# Patient Record
Sex: Male | Born: 1970 | Race: Black or African American | Hispanic: No | Marital: Married | Smoking: Former smoker
Health system: Southern US, Community
[De-identification: ages and names within clinical notes are randomized; demographics above are authoritative.]

## PROBLEM LIST (undated history)

## (undated) DIAGNOSIS — K219 Gastro-esophageal reflux disease without esophagitis: Secondary | ICD-10-CM

## (undated) DIAGNOSIS — C801 Malignant (primary) neoplasm, unspecified: Secondary | ICD-10-CM

## (undated) DIAGNOSIS — G709 Myoneural disorder, unspecified: Secondary | ICD-10-CM

## (undated) DIAGNOSIS — D649 Anemia, unspecified: Secondary | ICD-10-CM

## (undated) HISTORY — PX: HERNIA REPAIR: SHX51

## (undated) MED FILL — Dexamethasone Sodium Phosphate Inj 100 MG/10ML: INTRAMUSCULAR | Qty: 1 | Status: AC

---

## 2021-01-06 DIAGNOSIS — C801 Malignant (primary) neoplasm, unspecified: Secondary | ICD-10-CM

## 2021-01-06 HISTORY — DX: Malignant (primary) neoplasm, unspecified: C80.1

## 2021-04-12 ENCOUNTER — Emergency Department (HOSPITAL_COMMUNITY): Payer: BC Managed Care – PPO

## 2021-04-12 ENCOUNTER — Observation Stay (HOSPITAL_COMMUNITY): Payer: BC Managed Care – PPO | Admitting: Certified Registered Nurse Anesthetist

## 2021-04-12 ENCOUNTER — Encounter (HOSPITAL_COMMUNITY): Payer: Self-pay

## 2021-04-12 ENCOUNTER — Inpatient Hospital Stay (HOSPITAL_COMMUNITY)
Admission: EM | Admit: 2021-04-12 | Discharge: 2021-04-16 | DRG: 377 | Disposition: A | Discharge status: Home / Self Care | Payer: BC Managed Care – PPO | Attending: Family Medicine | Admitting: Family Medicine

## 2021-04-12 ENCOUNTER — Encounter (HOSPITAL_COMMUNITY)
Admission: EM | Disposition: A | Discharge status: Home / Self Care | Payer: Self-pay | Source: Home / Self Care | Attending: Family Medicine

## 2021-04-12 ENCOUNTER — Other Ambulatory Visit: Payer: Self-pay

## 2021-04-12 DIAGNOSIS — E669 Obesity, unspecified: Secondary | ICD-10-CM | POA: Diagnosis present

## 2021-04-12 DIAGNOSIS — D62 Acute posthemorrhagic anemia: Secondary | ICD-10-CM | POA: Diagnosis present

## 2021-04-12 DIAGNOSIS — K760 Fatty (change of) liver, not elsewhere classified: Secondary | ICD-10-CM | POA: Diagnosis present

## 2021-04-12 DIAGNOSIS — Z833 Family history of diabetes mellitus: Secondary | ICD-10-CM | POA: Diagnosis not present

## 2021-04-12 DIAGNOSIS — K2970 Gastritis, unspecified, without bleeding: Secondary | ICD-10-CM

## 2021-04-12 DIAGNOSIS — K259 Gastric ulcer, unspecified as acute or chronic, without hemorrhage or perforation: Secondary | ICD-10-CM

## 2021-04-12 DIAGNOSIS — B9681 Helicobacter pylori [H. pylori] as the cause of diseases classified elsewhere: Secondary | ICD-10-CM | POA: Diagnosis present

## 2021-04-12 DIAGNOSIS — D75838 Other thrombocytosis: Secondary | ICD-10-CM | POA: Diagnosis present

## 2021-04-12 DIAGNOSIS — K5900 Constipation, unspecified: Secondary | ICD-10-CM | POA: Diagnosis present

## 2021-04-12 DIAGNOSIS — E278 Other specified disorders of adrenal gland: Secondary | ICD-10-CM | POA: Diagnosis present

## 2021-04-12 DIAGNOSIS — K5730 Diverticulosis of large intestine without perforation or abscess without bleeding: Secondary | ICD-10-CM | POA: Diagnosis present

## 2021-04-12 DIAGNOSIS — K2971 Gastritis, unspecified, with bleeding: Secondary | ICD-10-CM | POA: Diagnosis present

## 2021-04-12 DIAGNOSIS — N400 Enlarged prostate without lower urinary tract symptoms: Secondary | ICD-10-CM | POA: Diagnosis present

## 2021-04-12 DIAGNOSIS — Z6833 Body mass index (BMI) 33.0-33.9, adult: Secondary | ICD-10-CM | POA: Diagnosis not present

## 2021-04-12 DIAGNOSIS — E279 Disorder of adrenal gland, unspecified: Secondary | ICD-10-CM

## 2021-04-12 DIAGNOSIS — K631 Perforation of intestine (nontraumatic): Secondary | ICD-10-CM | POA: Diagnosis present

## 2021-04-12 DIAGNOSIS — Z87891 Personal history of nicotine dependence: Secondary | ICD-10-CM

## 2021-04-12 DIAGNOSIS — D649 Anemia, unspecified: Secondary | ICD-10-CM

## 2021-04-12 DIAGNOSIS — Y92009 Unspecified place in unspecified non-institutional (private) residence as the place of occurrence of the external cause: Secondary | ICD-10-CM

## 2021-04-12 DIAGNOSIS — T39395A Adverse effect of other nonsteroidal anti-inflammatory drugs [NSAID], initial encounter: Secondary | ICD-10-CM | POA: Diagnosis present

## 2021-04-12 DIAGNOSIS — K2100 Gastro-esophageal reflux disease with esophagitis, without bleeding: Secondary | ICD-10-CM | POA: Diagnosis present

## 2021-04-12 DIAGNOSIS — Z7982 Long term (current) use of aspirin: Secondary | ICD-10-CM

## 2021-04-12 DIAGNOSIS — K254 Chronic or unspecified gastric ulcer with hemorrhage: Principal | ICD-10-CM | POA: Diagnosis present

## 2021-04-12 DIAGNOSIS — K5790 Diverticulosis of intestine, part unspecified, without perforation or abscess without bleeding: Secondary | ICD-10-CM | POA: Diagnosis present

## 2021-04-12 DIAGNOSIS — D75839 Thrombocytosis, unspecified: Secondary | ICD-10-CM | POA: Diagnosis present

## 2021-04-12 DIAGNOSIS — E66811 Obesity, class 1: Secondary | ICD-10-CM | POA: Diagnosis present

## 2021-04-12 DIAGNOSIS — K922 Gastrointestinal hemorrhage, unspecified: Principal | ICD-10-CM | POA: Diagnosis present

## 2021-04-12 DIAGNOSIS — E871 Hypo-osmolality and hyponatremia: Secondary | ICD-10-CM | POA: Diagnosis present

## 2021-04-12 DIAGNOSIS — E611 Iron deficiency: Secondary | ICD-10-CM | POA: Diagnosis present

## 2021-04-12 DIAGNOSIS — K209 Esophagitis, unspecified without bleeding: Secondary | ICD-10-CM

## 2021-04-12 HISTORY — PX: ESOPHAGOGASTRODUODENOSCOPY: SHX5428

## 2021-04-12 HISTORY — DX: Gastrointestinal hemorrhage, unspecified: K92.2

## 2021-04-12 HISTORY — DX: Obesity, class 1: E66.811

## 2021-04-12 HISTORY — DX: Fatty (change of) liver, not elsewhere classified: K76.0

## 2021-04-12 HISTORY — PX: BIOPSY: SHX5522

## 2021-04-12 HISTORY — DX: Obesity, unspecified: E66.9

## 2021-04-12 HISTORY — DX: Diverticulosis of intestine, part unspecified, without perforation or abscess without bleeding: K57.90

## 2021-04-12 LAB — CBC
HCT: 24.3 % — ABNORMAL LOW (ref 39.0–52.0)
Hemoglobin: 7.7 g/dL — ABNORMAL LOW (ref 13.0–17.0)
MCH: 28 pg (ref 26.0–34.0)
MCHC: 31.7 g/dL (ref 30.0–36.0)
MCV: 88.4 fL (ref 80.0–100.0)
Platelets: 420 10*3/uL — ABNORMAL HIGH (ref 150–400)
RBC: 2.75 MIL/uL — ABNORMAL LOW (ref 4.22–5.81)
RDW: 15.7 % — ABNORMAL HIGH (ref 11.5–15.5)
WBC: 10.6 10*3/uL — ABNORMAL HIGH (ref 4.0–10.5)
nRBC: 0 % (ref 0.0–0.2)

## 2021-04-12 LAB — HEMOGLOBIN AND HEMATOCRIT, BLOOD
HCT: 23.4 % — ABNORMAL LOW (ref 39.0–52.0)
HCT: 24 % — ABNORMAL LOW (ref 39.0–52.0)
Hemoglobin: 7.9 g/dL — ABNORMAL LOW (ref 13.0–17.0)
Hemoglobin: 7.9 g/dL — ABNORMAL LOW (ref 13.0–17.0)

## 2021-04-12 LAB — URINALYSIS, ROUTINE W REFLEX MICROSCOPIC
Bilirubin Urine: NEGATIVE
Glucose, UA: NEGATIVE mg/dL
Hgb urine dipstick: NEGATIVE
Ketones, ur: 5 mg/dL — AB
Leukocytes,Ua: NEGATIVE
Nitrite: NEGATIVE
Protein, ur: NEGATIVE mg/dL
Specific Gravity, Urine: 1.03 (ref 1.005–1.030)
pH: 5 (ref 5.0–8.0)

## 2021-04-12 LAB — ABO/RH: ABO/RH(D): O POS

## 2021-04-12 LAB — COMPREHENSIVE METABOLIC PANEL
ALT: 11 U/L (ref 0–44)
AST: 15 U/L (ref 15–41)
Albumin: 4.1 g/dL (ref 3.5–5.0)
Alkaline Phosphatase: 69 U/L (ref 38–126)
Anion gap: 10 (ref 5–15)
BUN: 27 mg/dL — ABNORMAL HIGH (ref 6–20)
CO2: 26 mmol/L (ref 22–32)
Calcium: 9.1 mg/dL (ref 8.9–10.3)
Chloride: 97 mmol/L — ABNORMAL LOW (ref 98–111)
Creatinine, Ser: 1.01 mg/dL (ref 0.61–1.24)
GFR, Estimated: >60 mL/min (ref >60)
Glucose, Bld: 117 mg/dL — ABNORMAL HIGH (ref 70–99)
Potassium: 4.7 mmol/L (ref 3.5–5.1)
Sodium: 133 mmol/L — ABNORMAL LOW (ref 135–145)
Total Bilirubin: 0.6 mg/dL (ref 0.3–1.2)
Total Protein: 7.7 g/dL (ref 6.5–8.1)

## 2021-04-12 LAB — MRSA NEXT GEN BY PCR, NASAL: MRSA by PCR Next Gen: NOT DETECTED

## 2021-04-12 LAB — LIPASE, BLOOD: Lipase: 28 U/L (ref 11–51)

## 2021-04-12 LAB — POC OCCULT BLOOD, ED: Fecal Occult Bld: POSITIVE — AB

## 2021-04-12 LAB — PREPARE RBC (CROSSMATCH)

## 2021-04-12 SURGERY — EGD (ESOPHAGOGASTRODUODENOSCOPY)
Anesthesia: Monitor Anesthesia Care

## 2021-04-12 MED ORDER — ACETAMINOPHEN 325 MG PO TABS: 650.0000 mg | ORAL_TABLET | Freq: Four times a day (QID) | ORAL | Status: DC | PRN

## 2021-04-12 MED ORDER — SODIUM CHLORIDE 0.9 % IV BOLUS
1000.0000 mL | Freq: Once | INTRAVENOUS | Status: AC
Start: 2021-04-12 — End: 2021-04-12
  Administered 2021-04-12: 1000 mL via INTRAVENOUS

## 2021-04-12 MED ORDER — PANTOPRAZOLE SODIUM 40 MG IV SOLR: 40.0000 mg | Freq: Two times a day (BID) | INTRAVENOUS | Status: DC

## 2021-04-12 MED ORDER — SODIUM CHLORIDE 0.9 % IV SOLN
10.0000 mL/h | Freq: Once | INTRAVENOUS | Status: AC
Start: 2021-04-12 — End: 2021-04-12
  Administered 2021-04-12: 10 mL/h via INTRAVENOUS

## 2021-04-12 MED ORDER — LACTATED RINGERS IV SOLN: INTRAVENOUS | Status: DC

## 2021-04-12 MED ORDER — PROPOFOL 500 MG/50ML IV EMUL
INTRAVENOUS | Status: DC | PRN
  Administered 2021-04-12: 150 ug/kg/min via INTRAVENOUS

## 2021-04-12 MED ORDER — CHLORHEXIDINE GLUCONATE CLOTH 2 % EX PADS
6.0000 | MEDICATED_PAD | Freq: Every day | CUTANEOUS | Status: DC
  Administered 2021-04-12 – 2021-04-15 (×3): 6 via TOPICAL

## 2021-04-12 MED ORDER — PROPOFOL 500 MG/50ML IV EMUL
INTRAVENOUS | Status: DC | PRN
  Administered 2021-04-12: 20 mg via INTRAVENOUS

## 2021-04-12 MED ORDER — PANTOPRAZOLE SODIUM 40 MG IV SOLR
40.0000 mg | Freq: Once | INTRAVENOUS | Status: AC
Start: 2021-04-12 — End: 2021-04-12
  Administered 2021-04-12: 40 mg via INTRAVENOUS
  Filled 2021-04-12: qty 10

## 2021-04-12 MED ORDER — MORPHINE SULFATE (PF) 4 MG/ML IV SOLN
4.0000 mg | Freq: Once | INTRAVENOUS | Status: AC
  Administered 2021-04-12: 4 mg via INTRAVENOUS
  Filled 2021-04-12: qty 1

## 2021-04-12 MED ORDER — SODIUM CHLORIDE (PF) 0.9 % IJ SOLN
INTRAMUSCULAR | Status: AC
  Filled 2021-04-12: qty 50

## 2021-04-12 MED ORDER — IOHEXOL 300 MG/ML  SOLN
100.0000 mL | Freq: Once | INTRAMUSCULAR | Status: AC | PRN
  Administered 2021-04-12: 100 mL via INTRAVENOUS

## 2021-04-12 MED ORDER — ONDANSETRON HCL 4 MG/2ML IJ SOLN
4.0000 mg | Freq: Once | INTRAMUSCULAR | Status: AC
Start: 2021-04-12 — End: 2021-04-12
  Administered 2021-04-12: 4 mg via INTRAVENOUS
  Filled 2021-04-12: qty 2

## 2021-04-12 MED ORDER — ACETAMINOPHEN 650 MG RE SUPP
650.0000 mg | Freq: Four times a day (QID) | RECTAL | Status: DC | PRN
Start: 2021-04-12 — End: 2021-04-16

## 2021-04-12 MED ORDER — PANTOPRAZOLE INFUSION (NEW) - SIMPLE MED
8.0000 mg/h | INTRAVENOUS | Status: AC
Start: 2021-04-12 — End: 2021-04-15
  Administered 2021-04-12 – 2021-04-15 (×7): 8 mg/h via INTRAVENOUS
  Filled 2021-04-12: qty 80
  Filled 2021-04-12: qty 100
  Filled 2021-04-12 (×2): qty 80
  Filled 2021-04-12 (×2): qty 100
  Filled 2021-04-12 (×2): qty 80
  Filled 2021-04-12: qty 100
  Filled 2021-04-12: qty 80
  Filled 2021-04-12: qty 100
  Filled 2021-04-12: qty 80

## 2021-04-12 MED ORDER — SODIUM CHLORIDE 0.9 % IV SOLN: INTRAVENOUS | Status: DC

## 2021-04-12 NOTE — Interval H&P Note (Signed)
History and Physical Interval Note: ? ?04/12/2021 ?2:08 PM ? ?Jonathan Maynard  has presented today for surgery, with the diagnosis of GIB.  The various methods of treatment have been discussed with the patient and family. After consideration of risks, benefits and other options for treatment, the patient has consented to  Procedure(s): ?ESOPHAGOGASTRODUODENOSCOPY (EGD) (N/A) as a surgical intervention.  The patient's history has been reviewed, patient examined, no change in status, stable for surgery.  I have reviewed the patient's chart and labs.  Questions were answered to the patient's satisfaction.   ? ? ?[name] ? ? ?

## 2021-04-12 NOTE — Anesthesia Preprocedure Evaluation (Addendum)
Anesthesia Evaluation  ?Patient identified by MRN, date of birth, ID band ?Patient awake ? ? ? ?Reviewed: ?Allergy & Precautions, NPO status , Patient's Chart, lab work & pertinent test results ? ?Airway ?Mallampati: I ? ? ? ? ? ? Dental ?no notable dental hx. ? ?  ?Pulmonary ?neg pulmonary ROS, former smoker,  ?  ?Pulmonary exam normal ? ? ? ? ? ? ? Cardiovascular ?negative cardio ROS ?Normal cardiovascular exam ? ? ?  ?Neuro/Psych ?negative neurological ROS ? negative psych ROS  ? GI/Hepatic ?Neg liver ROS,   ?Endo/Other  ?negative endocrine ROS ? Renal/GU ?negative Renal ROS  ?negative genitourinary ?  ?Musculoskeletal ?negative musculoskeletal ROS ?(+)  ? Abdominal ?(+) + obese,   ?Peds ?negative pediatric ROS ?(+)  Hematology ? ?(+) Blood dyscrasia, anemia ,   ?Anesthesia Other Findings ? ? Reproductive/Obstetrics ?negative OB ROS ? ?  ? ? ? ? ? ? ? ? ? ? ? ? ? ?  ?  ? ? ? ? ? ? ? ? ?Anesthesia Physical ?Anesthesia Plan ? ?ASA: 2 ? ?Anesthesia Plan: MAC  ? ?Post-op Pain Management: Minimal or no pain anticipated  ? ?Induction: Intravenous ? ?PONV Risk Score and Plan: Propofol infusion, TIVA and Treatment may vary due to age or medical condition ? ?Airway Management Planned: Natural Airway and Mask ? ?Additional Equipment: None ? ?Intra-op Plan:  ? ?Post-operative Plan:  ? ?Informed Consent: I have reviewed the patients History and Physical, chart, labs and discussed the procedure including the risks, benefits and alternatives for the proposed anesthesia with the patient or authorized representative who has indicated his/her understanding and acceptance.  ? ? ? ?Dental advisory given ? ?Plan Discussed with: CRNA ? ?Anesthesia Plan Comments:   ? ? ? ? ? ?Anesthesia Quick Evaluation ? ?

## 2021-04-12 NOTE — ED Provider Notes (Signed)
?[facility] ?Provider Note ? ? ?CSN: [id number] ?Arrival date & time: 04/12/21  0418 ? ?  ? ?History ? ?Chief Complaint  ?Patient presents with  ? Emesis  ? ? ?Jonathan Maynard is a 51 y.o. male. ? ?The history is provided by the patient and medical records. No language interpreter was used.  ?Emesis ? ?51 year old male presenting with complaints of abdominal pain.  Patient report for the past 5 days he has had mid abdominal discomfort.  He described pain as sharp and cramping sensation waxing and waning with associated nausea, vomiting brownish emesis as well as having some constipation.  He endorses chills.  He denies fever chest pain shortness of breath productive cough dysuria.  He was seen at urgent care center several days prior for his symptoms, states he had an x-ray of his abdomen and was told that he was constipated.  He was prescribed MiraLAX, and stool softener.  Last bowel movement was yesterday and was soft.  He still endorse abdominal pain with associated nausea and vomiting.  No prior abdominal surgeries no dysuria.  No recent sick contact ? ?Home Medications ?Prior to Admission medications   ?Not on File  ?   ? ?Allergies    ?Patient has no known allergies.   ? ?Review of Systems   ?Review of Systems  ?Gastrointestinal:  Positive for vomiting.  ?All other systems reviewed and are negative. ? ?Physical Exam ?Updated Vital Signs ?BP (!) 128/95   Pulse (!) 106   Temp 98.4 ?F (36.9 ?C) (Oral)   Resp 18   Ht '5\' 5"'$  (1.651 m)   Wt 90.7 kg   SpO2 99%   BMI 33.28 kg/m?  ?Physical Exam ?Vitals and nursing note reviewed.  ?Constitutional:   ?   General: He is not in acute distress. ?   Appearance: He is well-developed.  ?HENT:  ?   Head: Atraumatic.  ?Eyes:  ?   Conjunctiva/sclera: Conjunctivae normal.  ?Cardiovascular:  ?   Rate and Rhythm: Normal rate and regular rhythm.  ?   Pulses: Normal pulses.  ?   Heart sounds: Normal heart sounds.  ?Pulmonary:  ?    Effort: Pulmonary effort is normal.  ?   Breath sounds: Normal breath sounds. No wheezing, rhonchi or rales.  ?Abdominal:  ?   General: Bowel sounds are normal.  ?   Palpations: Abdomen is soft.  ?   Tenderness: There is abdominal tenderness (Mild diffuse abdominal tenderness without focal point tenderness).  ?Genitourinary: ?   Comments: Chaperone present during exam.  Normal rectal tone, no obvious mass, melanotic stool noted on glove ?Musculoskeletal:  ?   Cervical back: Neck supple.  ?Skin: ?   Findings: No rash.  ?Neurological:  ?   Mental Status: He is alert. Mental status is at baseline.  ? ? ?ED Results / Procedures / Treatments   ?Labs ?(all labs ordered are listed, but only abnormal results are displayed) ?Labs Reviewed  ?COMPREHENSIVE METABOLIC PANEL - Abnormal; Notable for the following components:  ?    Result Value  ? Sodium 133 (*)   ? Chloride 97 (*)   ? Glucose, Bld 117 (*)   ? BUN 27 (*)   ? All other components within normal limits  ?CBC - Abnormal; Notable for the following components:  ? WBC 10.6 (*)   ? RBC 2.75 (*)   ? Hemoglobin 7.7 (*)   ? HCT 24.3 (*)   ? RDW 15.7 (*)   ?  Platelets 420 (*)   ? All other components within normal limits  ?URINALYSIS, ROUTINE W REFLEX MICROSCOPIC - Abnormal; Notable for the following components:  ? Ketones, ur 5 (*)   ? All other components within normal limits  ?HEMOGLOBIN AND HEMATOCRIT, BLOOD - Abnormal; Notable for the following components:  ? Hemoglobin 7.9 (*)   ? HCT 23.4 (*)   ? All other components within normal limits  ?HEMOGLOBIN AND HEMATOCRIT, BLOOD - Abnormal; Notable for the following components:  ? Hemoglobin 7.9 (*)   ? HCT 24.0 (*)   ? All other components within normal limits  ?POC OCCULT BLOOD, ED - Abnormal; Notable for the following components:  ? Fecal Occult Bld POSITIVE (*)   ? All other components within normal limits  ?MRSA NEXT GEN BY PCR, NASAL  ?LIPASE, BLOOD  ?PREPARE RBC (CROSSMATCH)  ?TYPE AND SCREEN  ?ABO/RH  ?SURGICAL  PATHOLOGY  ? ? ?EKG ?None ? ?Radiology ?CT ABDOMEN PELVIS W CONTRAST ? ?Result Date: 04/12/2021 ?CLINICAL DATA:  Acute abdominal pain. EXAM: CT ABDOMEN AND PELVIS WITH CONTRAST TECHNIQUE: Multidetector CT imaging of the abdomen and pelvis was performed using the standard protocol following bolus administration of intravenous contrast. RADIATION DOSE REDUCTION: This exam was performed according to the departmental dose-optimization program which includes automated exposure control, adjustment of the mA and/or kV according to patient size and/or use of iterative reconstruction technique. CONTRAST:  131m OMNIPAQUE IOHEXOL 300 MG/ML  SOLN COMPARISON:  None. FINDINGS: Lower chest: Lung bases are clear of infiltrates. There is a calcified granuloma laterally in the left lower lobe. The cardiac size is normal. Hepatobiliary: The liver 15.5 cm length mildly steatotic without mass enhancement. Unremarkable gallbladder, bile ducts. Pancreas: No focal abnormality. Spleen: No focal abnormality. Adrenals/Urinary Tract: 2 cm indeterminate left adrenal mass. Further evaluation recommended. There is no right adrenal mass. No significant abnormality of the renal cortex. There is a small wedge-shaped infarct defect in the superior pole right kidney. There is no urinary stone or obstruction. There is mild bladder thickening versus nondistention. There is a nodular protrusion of the prostate gland against the bladder base. Stomach/Bowel: The stomach is distended with fluid and there is moderate irregular circumferential thickening of the distal gastric antrum. The unopacified small bowel is unremarkable. The appendix is normal and well seen. There is mild stool retention. There are colonic diverticula without evidence of diverticulitis. Vascular/Lymphatic: Minimal aortic atherosclerosis. No AAA. There are enlarged mesenteric lymph nodes in the hypogastrium centered below the thickened gastric antrum and measuring up to 1.4 cm in short  axis. Mildly prominent portacaval and periportal nodes are noted up to 1 cm in short axis. There is no pelvic adenopathy. Reproductive: Enlarged prostate measures 4.8 cm, as above there is a nodular protrusion superiorly against the bladder base. Clinical correlation recommended. Other: Small umbilical and bilateral inguinal fat hernias. There are pelvic phleboliths. There is no free air, hemorrhage or fluid. Musculoskeletal: There are mild degenerative changes of the spine and hips. No acute or significant osseous findings. IMPRESSION: 1. There is circumferential irregular wall thickening of the gastric antrum which significantly narrows the distal gastric lumen and could be partially obstructing gastric emptying as the more proximal stomach is distended with fluid. Severe peptic ulcer disease and infiltrating disease are both possibilities and there is underlying adenopathy in the hypogastrium. Follow-up endoscopy is recommended. 2. Mild hepatic steatosis. 3. Prostatomegaly with mild bladder thickening or nondistention. 4. Diverticulosis without diverticulitis. 5. Umbilical and inguinal fat hernias. 6. Indeterminate 2  cm left adrenal nodule. Adrenal dedicated imaging recommended. Electronically Signed   By: Telford Nab M.D.   On: 04/12/2021 06:29   ? ?Procedures ?Marland KitchenCritical Care ?Performed by: Domenic Moras, PA-C ?Authorized by: Domenic Moras, PA-C  ? ?Critical care provider statement:  ?  Critical care time (minutes):  46 ?  Critical care was time spent personally by me on the following activities:  Development of treatment plan with patient or surrogate, discussions with consultants, evaluation of patient's response to treatment, examination of patient, ordering and review of laboratory studies, ordering and review of radiographic studies, ordering and performing treatments and interventions, pulse oximetry, re-evaluation of patient's condition and review of old charts  ? ? ?Medications Ordered in ED ?Medications   ?pantoprozole (PROTONIX) 80 mg /NS 100 mL infusion (8 mg/hr Intravenous New Bag/Given 04/12/21 1551)  ?pantoprazole (PROTONIX) injection 40 mg ( Intravenous MAR Unhold 04/12/21 1448)  ?acetaminophen (TYLENOL) tablet 650 mg

## 2021-04-12 NOTE — ED Notes (Signed)
Pt to Endo 

## 2021-04-12 NOTE — Brief Op Note (Signed)
04/12/2021 ? ?2:31 PM ? ?PATIENT:  Jonathan Maynard  51 y.o. male ? ?PRE-OPERATIVE DIAGNOSIS:  GIB ? ?POST-OPERATIVE DIAGNOSIS:  esophagitis, antrum/pyloric ulcers ? ?PROCEDURE:  Procedure(s): ?ESOPHAGOGASTRODUODENOSCOPY (EGD) (N/A) ?BIOPSY ? ?SURGEON:  Surgeon(s) and Role: ?   Wilford Corner, MD - Primary ? ?Large cratered (bleeding stigmata but not active bleeding) ulcers in antrum and pyloric channel of stomach likely due to NSAIDs and/or H. Pylori but malignancy also possible. Biopsies of antral wall taken. If significant bleeding recurs, then will need embolization by IR. Strict NPO today. Continue Protonix drip for next 2 days minimum. If stable ok to do ice chips and sips of water tomorrow. Will follow. Updated wife by phone. ?

## 2021-04-12 NOTE — ED Notes (Signed)
Notified  Iyari,nurse that the blood bank has 2 units of blood ready for this patient. ?

## 2021-04-12 NOTE — H&P (View-Only) (Signed)
Referring Provider: Dr. Velia Meyer ?Primary Care Physician:  Pcp, No ?Primary Gastroenterologist:  Unassigned ? ?Reason for Consultation:  GI Bleed ? ?HPI: Jonathan Maynard is a 51 y.o. male 2 weeks of intermittent sharp lower abdominal pain (described as epigastric in chart review of admission) and for coffee grounds emesis and black stools daily for the past week. Started taking multiple doses of Ibuprofen 2 weeks ago when his abdominal pain started and denies NSAIDs before then. Rare alcohol and none recently. Denies any known history of ulcers. Hgb 7.9. CT shows irregular antral wall thickening. He thinks he has had a colonoscopy in the past but does not recall for sure. ? ?Past Medical History:  ?Diagnosis Date  ? Acute upper GI bleed 04/12/2021  ? Class 1 obesity 04/12/2021  ? Diverticulosis 04/12/2021  ? Hepatic steatosis 04/12/2021  ? ? ?Past Surgical History:  ?Procedure Laterality Date  ? HERNIA REPAIR    ? ? ?Prior to Admission medications   ?Medication Sig Start Date End Date Taking? Authorizing Provider  ?ASPIRIN PO Take 1 tablet by mouth daily.   Yes [provider]  ?ibuprofen (ADVIL) 200 MG tablet Take 200 mg by mouth every 6 (six) hours as needed for mild pain or moderate pain.   Yes [provider]  ?polyethylene glycol (MIRALAX / GLYCOLAX) 17 g packet Take 17 g by mouth daily as needed for moderate constipation or mild constipation.   Yes [provider]  ? ? ?Scheduled Meds: ? [MAR Hold] pantoprazole  40 mg Intravenous Q12H  ? ?Continuous Infusions: ? sodium chloride 20 mL/hr at 04/12/21 1250  ? lactated ringers    ? pantoprazole 8 mg/hr (04/12/21 0711)  ? ?PRN Meds:.[MAR Hold] acetaminophen **OR** [MAR Hold] acetaminophen ? ?Allergies as of 04/12/2021  ? (No Known Allergies)  ? ? ?Family History  ?Problem Relation Age of Onset  ? Diabetes Mother   ? ? ?Social History  ? ?Socioeconomic History  ? Marital status: Married  ?  Spouse name: Not on file  ? Number of children:  Not on file  ? Years of education: Not on file  ? Highest education level: Not on file  ?Occupational History  ? Not on file  ?Tobacco Use  ? Smoking status: Former  ?  Packs/day: 0.50  ?  Years: 35.00  ?  Pack years: 17.50  ?  Types: Cigarettes  ? Smokeless tobacco: Never  ?Substance and Sexual Activity  ? Alcohol use: Not Currently  ? Drug use: Never  ? Sexual activity: Not on file  ?Other Topics Concern  ? Not on file  ?Social History Narrative  ? Not on file  ? ?Social Determinants of Health  ? ?Financial Resource Strain: Not on file  ?Food Insecurity: Not on file  ?Transportation Needs: Not on file  ?Physical Activity: Not on file  ?Stress: Not on file  ?Social Connections: Not on file  ?Intimate Partner Violence: Not on file  ? ? ?Review of Systems: All negative except as stated above in HPI. ? ?Physical Exam: ?Vital signs: ?Vitals:  ? 04/12/21 1243 04/12/21 1317  ?BP: 102/75 115/64  ?Pulse: 75 74  ?Resp: 17 20  ?Temp: 98.3 ?F (36.8 ?C) 97.7 ?F (36.5 ?C)  ?SpO2: 99% 97%  ? ?  ?General:   Lethargic, Well-developed, well-nourished, pleasant and cooperative in NAD ?Head: normocephalic, atraumatic ?Eyes: anicteric sclera ?ENT: oropharynx clear ?Neck: supple, nontender ?Lungs:  Clear throughout to auscultation.   No wheezes, crackles, or rhonchi. No acute distress. ?  Heart:  Regular rate and rhythm; no murmurs, clicks, rubs,  or gallops. ?Abdomen: soft, nontender, nondistended, +BS ?Rectal:  Deferred ?Ext: no edema ? ?GI:  ?Lab Results: ?Recent Labs  ?  04/12/21 ?7902 04/12/21 ?1300  ?WBC 10.6*  --   ?HGB 7.7* 7.9*  ?HCT 24.3* 23.4*  ?PLT 420*  --   ? ?BMET ?Recent Labs  ?  04/12/21 ?0435  ?NA 133*  ?K 4.7  ?CL 97*  ?CO2 26  ?GLUCOSE 117*  ?BUN 27*  ?CREATININE 1.01  ?CALCIUM 9.1  ? ?LFT ?Recent Labs  ?  04/12/21 ?0435  ?PROT 7.7  ?ALBUMIN 4.1  ?AST 15  ?ALT 11  ?ALKPHOS 69  ?BILITOT 0.6  ? ?PT/INR ?No results for input(s): LABPROT, INR in the last 72 hours. ? ? ?Studies/Results: ?CT ABDOMEN PELVIS W  CONTRAST ? ?Result Date: 04/12/2021 ?CLINICAL DATA:  Acute abdominal pain. EXAM: CT ABDOMEN AND PELVIS WITH CONTRAST TECHNIQUE: Multidetector CT imaging of the abdomen and pelvis was performed using the standard protocol following bolus administration of intravenous contrast. RADIATION DOSE REDUCTION: This exam was performed according to the departmental dose-optimization program which includes automated exposure control, adjustment of the mA and/or kV according to patient size and/or use of iterative reconstruction technique. CONTRAST:  19m OMNIPAQUE IOHEXOL 300 MG/ML  SOLN COMPARISON:  None. FINDINGS: Lower chest: Lung bases are clear of infiltrates. There is a calcified granuloma laterally in the left lower lobe. The cardiac size is normal. Hepatobiliary: The liver 15.5 cm length mildly steatotic without mass enhancement. Unremarkable gallbladder, bile ducts. Pancreas: No focal abnormality. Spleen: No focal abnormality. Adrenals/Urinary Tract: 2 cm indeterminate left adrenal mass. Further evaluation recommended. There is no right adrenal mass. No significant abnormality of the renal cortex. There is a small wedge-shaped infarct defect in the superior pole right kidney. There is no urinary stone or obstruction. There is mild bladder thickening versus nondistention. There is a nodular protrusion of the prostate gland against the bladder base. Stomach/Bowel: The stomach is distended with fluid and there is moderate irregular circumferential thickening of the distal gastric antrum. The unopacified small bowel is unremarkable. The appendix is normal and well seen. There is mild stool retention. There are colonic diverticula without evidence of diverticulitis. Vascular/Lymphatic: Minimal aortic atherosclerosis. No AAA. There are enlarged mesenteric lymph nodes in the hypogastrium centered below the thickened gastric antrum and measuring up to 1.4 cm in short axis. Mildly prominent portacaval and periportal nodes are  noted up to 1 cm in short axis. There is no pelvic adenopathy. Reproductive: Enlarged prostate measures 4.8 cm, as above there is a nodular protrusion superiorly against the bladder base. Clinical correlation recommended. Other: Small umbilical and bilateral inguinal fat hernias. There are pelvic phleboliths. There is no free air, hemorrhage or fluid. Musculoskeletal: There are mild degenerative changes of the spine and hips. No acute or significant osseous findings. IMPRESSION: 1. There is circumferential irregular wall thickening of the gastric antrum which significantly narrows the distal gastric lumen and could be partially obstructing gastric emptying as the more proximal stomach is distended with fluid. Severe peptic ulcer disease and infiltrating disease are both possibilities and there is underlying adenopathy in the hypogastrium. Follow-up endoscopy is recommended. 2. Mild hepatic steatosis. 3. Prostatomegaly with mild bladder thickening or nondistention. 4. Diverticulosis without diverticulitis. 5. Umbilical and inguinal fat hernias. 6. Indeterminate 2 cm left adrenal nodule. Adrenal dedicated imaging recommended. Electronically Signed   By: KTelford NabM.D.   On: 04/12/2021 06:29   ? ?  Impression/Plan: ?Upper GI bleed likely due to an ulcer with his recent heavy NSAID use. CT showing irregular appearance of that gastric antral wall. Gastric malignancy also possible. EGD today. PPI drip. Supportive care. ? ? ? LOS: 0 days  ? ?[name]  04/12/2021, 2:01 PM ? ?Questions please call [phone]  ? ?

## 2021-04-12 NOTE — Consult Note (Signed)
Referring Provider: Dr. Velia Meyer ?Primary Care Physician:  Pcp, No ?Primary Gastroenterologist:  Unassigned ? ?Reason for Consultation:  GI Bleed ? ?HPI: Jonathan Maynard is a 51 y.o. male 2 weeks of intermittent sharp lower abdominal pain (described as epigastric in chart review of admission) and for coffee grounds emesis and black stools daily for the past week. Started taking multiple doses of Ibuprofen 2 weeks ago when his abdominal pain started and denies NSAIDs before then. Rare alcohol and none recently. Denies any known history of ulcers. Hgb 7.9. CT shows irregular antral wall thickening. He thinks he has had a colonoscopy in the past but does not recall for sure. ? ?Past Medical History:  ?Diagnosis Date  ? Acute upper GI bleed 04/12/2021  ? Class 1 obesity 04/12/2021  ? Diverticulosis 04/12/2021  ? Hepatic steatosis 04/12/2021  ? ? ?Past Surgical History:  ?Procedure Laterality Date  ? HERNIA REPAIR    ? ? ?Prior to Admission medications   ?Medication Sig Start Date End Date Taking? Authorizing Provider  ?ASPIRIN PO Take 1 tablet by mouth daily.   Yes [provider]  ?ibuprofen (ADVIL) 200 MG tablet Take 200 mg by mouth every 6 (six) hours as needed for mild pain or moderate pain.   Yes [provider]  ?polyethylene glycol (MIRALAX / GLYCOLAX) 17 g packet Take 17 g by mouth daily as needed for moderate constipation or mild constipation.   Yes [provider]  ? ? ?Scheduled Meds: ? [MAR Hold] pantoprazole  40 mg Intravenous Q12H  ? ?Continuous Infusions: ? sodium chloride 20 mL/hr at 04/12/21 1250  ? lactated ringers    ? pantoprazole 8 mg/hr (04/12/21 0711)  ? ?PRN Meds:.[MAR Hold] acetaminophen **OR** [MAR Hold] acetaminophen ? ?Allergies as of 04/12/2021  ? (No Known Allergies)  ? ? ?Family History  ?Problem Relation Age of Onset  ? Diabetes Mother   ? ? ?Social History  ? ?Socioeconomic History  ? Marital status: Married  ?  Spouse name: Not on file  ? Number of children:  Not on file  ? Years of education: Not on file  ? Highest education level: Not on file  ?Occupational History  ? Not on file  ?Tobacco Use  ? Smoking status: Former  ?  Packs/day: 0.50  ?  Years: 35.00  ?  Pack years: 17.50  ?  Types: Cigarettes  ? Smokeless tobacco: Never  ?Substance and Sexual Activity  ? Alcohol use: Not Currently  ? Drug use: Never  ? Sexual activity: Not on file  ?Other Topics Concern  ? Not on file  ?Social History Narrative  ? Not on file  ? ?Social Determinants of Health  ? ?Financial Resource Strain: Not on file  ?Food Insecurity: Not on file  ?Transportation Needs: Not on file  ?Physical Activity: Not on file  ?Stress: Not on file  ?Social Connections: Not on file  ?Intimate Partner Violence: Not on file  ? ? ?Review of Systems: All negative except as stated above in HPI. ? ?Physical Exam: ?Vital signs: ?Vitals:  ? 04/12/21 1243 04/12/21 1317  ?BP: 102/75 115/64  ?Pulse: 75 74  ?Resp: 17 20  ?Temp: 98.3 ?F (36.8 ?C) 97.7 ?F (36.5 ?C)  ?SpO2: 99% 97%  ? ?  ?General:   Lethargic, Well-developed, well-nourished, pleasant and cooperative in NAD ?Head: normocephalic, atraumatic ?Eyes: anicteric sclera ?ENT: oropharynx clear ?Neck: supple, nontender ?Lungs:  Clear throughout to auscultation.   No wheezes, crackles, or rhonchi. No acute distress. ?  Heart:  Regular rate and rhythm; no murmurs, clicks, rubs,  or gallops. ?Abdomen: soft, nontender, nondistended, +BS ?Rectal:  Deferred ?Ext: no edema ? ?GI:  ?Lab Results: ?Recent Labs  ?  04/12/21 ?4665 04/12/21 ?1300  ?WBC 10.6*  --   ?HGB 7.7* 7.9*  ?HCT 24.3* 23.4*  ?PLT 420*  --   ? ?BMET ?Recent Labs  ?  04/12/21 ?0435  ?NA 133*  ?K 4.7  ?CL 97*  ?CO2 26  ?GLUCOSE 117*  ?BUN 27*  ?CREATININE 1.01  ?CALCIUM 9.1  ? ?LFT ?Recent Labs  ?  04/12/21 ?0435  ?PROT 7.7  ?ALBUMIN 4.1  ?AST 15  ?ALT 11  ?ALKPHOS 69  ?BILITOT 0.6  ? ?PT/INR ?No results for input(s): LABPROT, INR in the last 72 hours. ? ? ?Studies/Results: ?CT ABDOMEN PELVIS W  CONTRAST ? ?Result Date: 04/12/2021 ?CLINICAL DATA:  Acute abdominal pain. EXAM: CT ABDOMEN AND PELVIS WITH CONTRAST TECHNIQUE: Multidetector CT imaging of the abdomen and pelvis was performed using the standard protocol following bolus administration of intravenous contrast. RADIATION DOSE REDUCTION: This exam was performed according to the departmental dose-optimization program which includes automated exposure control, adjustment of the mA and/or kV according to patient size and/or use of iterative reconstruction technique. CONTRAST:  19m OMNIPAQUE IOHEXOL 300 MG/ML  SOLN COMPARISON:  None. FINDINGS: Lower chest: Lung bases are clear of infiltrates. There is a calcified granuloma laterally in the left lower lobe. The cardiac size is normal. Hepatobiliary: The liver 15.5 cm length mildly steatotic without mass enhancement. Unremarkable gallbladder, bile ducts. Pancreas: No focal abnormality. Spleen: No focal abnormality. Adrenals/Urinary Tract: 2 cm indeterminate left adrenal mass. Further evaluation recommended. There is no right adrenal mass. No significant abnormality of the renal cortex. There is a small wedge-shaped infarct defect in the superior pole right kidney. There is no urinary stone or obstruction. There is mild bladder thickening versus nondistention. There is a nodular protrusion of the prostate gland against the bladder base. Stomach/Bowel: The stomach is distended with fluid and there is moderate irregular circumferential thickening of the distal gastric antrum. The unopacified small bowel is unremarkable. The appendix is normal and well seen. There is mild stool retention. There are colonic diverticula without evidence of diverticulitis. Vascular/Lymphatic: Minimal aortic atherosclerosis. No AAA. There are enlarged mesenteric lymph nodes in the hypogastrium centered below the thickened gastric antrum and measuring up to 1.4 cm in short axis. Mildly prominent portacaval and periportal nodes are  noted up to 1 cm in short axis. There is no pelvic adenopathy. Reproductive: Enlarged prostate measures 4.8 cm, as above there is a nodular protrusion superiorly against the bladder base. Clinical correlation recommended. Other: Small umbilical and bilateral inguinal fat hernias. There are pelvic phleboliths. There is no free air, hemorrhage or fluid. Musculoskeletal: There are mild degenerative changes of the spine and hips. No acute or significant osseous findings. IMPRESSION: 1. There is circumferential irregular wall thickening of the gastric antrum which significantly narrows the distal gastric lumen and could be partially obstructing gastric emptying as the more proximal stomach is distended with fluid. Severe peptic ulcer disease and infiltrating disease are both possibilities and there is underlying adenopathy in the hypogastrium. Follow-up endoscopy is recommended. 2. Mild hepatic steatosis. 3. Prostatomegaly with mild bladder thickening or nondistention. 4. Diverticulosis without diverticulitis. 5. Umbilical and inguinal fat hernias. 6. Indeterminate 2 cm left adrenal nodule. Adrenal dedicated imaging recommended. Electronically Signed   By: KTelford NabM.D.   On: 04/12/2021 06:29   ? ?  Impression/Plan: ?Upper GI bleed likely due to an ulcer with his recent heavy NSAID use. CT showing irregular appearance of that gastric antral wall. Gastric malignancy also possible. EGD today. PPI drip. Supportive care. ? ? ? LOS: 0 days  ? ?[name]  04/12/2021, 2:01 PM ? ?Questions please call [phone]  ? ?

## 2021-04-12 NOTE — Op Note (Signed)
[facility] ?Patient Name: Jonathan Maynard ?Procedure Date: 04/12/2021 ?MRN: [id number] ?Attending MD: [name] , MD ?Date of Birth: 18-Mar-1970 ?CSN: [id number] ?Age: 51 ?Admit Type: Inpatient ?Procedure:                Upper GI endoscopy ?Indications:              Epigastric abdominal pain, Coffee-ground emesis,  ?                          Melena ?Providers:                [name], MD, Truddie Coco, RN, Hayleigh  ?                          [place], RN ?Referring MD:             hospital team ?Medicines:                Propofol per Anesthesia, Monitored Anesthesia Care ?Complications:            No immediate complications. ?Estimated Blood Loss:     Estimated blood loss was minimal. ?Procedure:                Pre-Anesthesia Assessment: ?                          - Prior to the procedure, a History and Physical  ?                          was performed, and patient medications and  ?                          allergies were reviewed. The patient's tolerance of  ?                          previous anesthesia was also reviewed. The risks  ?                          and benefits of the procedure and the sedation  ?                          options and risks were discussed with the patient.  ?                          All questions were answered, and informed consent  ?                          was obtained. Prior Anticoagulants: The patient has  ?                          taken no previous anticoagulant or antiplatelet  ?                          agents. ASA Grade Assessment: II - A patient with  ?  mild systemic disease. After reviewing the risks  ?                          and benefits, the patient was deemed in  ?                          satisfactory condition to undergo the procedure. ?                          After obtaining informed consent, the endoscope was  ?                          passed under direct vision. Throughout the  ?                           procedure, the patient's blood pressure, pulse, and  ?                          oxygen saturations were monitored continuously. The  ?                          GIF-H190 ([id number]) Olympus endoscope was introduced  ?                          through the mouth, and advanced to the second part  ?                          of duodenum. The upper GI endoscopy was  ?                          accomplished without difficulty. The patient  ?                          tolerated the procedure well. ?Scope In: ?Scope Out: ?Findings: ?     LA Grade A (one or more mucosal breaks less than 5 mm, not extending  ?     between tops of 2 mucosal folds) esophagitis with no bleeding was found  ?     in the distal esophagus. ?     The exam of the esophagus was otherwise normal. ?     The Z-line was regular and was found 42 cm from the incisors. ?     Two partially obstructing non-bleeding cratered gastric ulcers of  ?     significant severity with pigmented material were found in the gastric  ?     antrum, in the prepyloric region of the stomach and at the pylorus.  ?     Perforation of the bowel wall cannot be ruled out. ?     Segmental severe inflammation characterized by congestion (edema),  ?     erythema and serpentine ulcerations was found in the gastric antrum.  ?     Biopsies were taken with a cold forceps for histology. Estimated blood  ?     loss was minimal. ?     The cardia and gastric fundus were normal on retroflexion. ?     One non-bleeding cratered duodenal ulcer with pigmented material was  ?  found in the duodenal bulb. ?     The exam of the duodenum was otherwise normal. ?Impression:               - LA Grade A reflux esophagitis with no bleeding. ?                          - Z-line regular, 42 cm from the incisors. ?                          - Partially obstructing non-bleeding gastric ulcers  ?                          with pigmented material. Suspicious for NSAID  ?                          induced etiology.  Perforation of the bowel wall  ?                          cannot be ruled out. ?                          - Acute gastritis. Biopsied. ?                          - Non-bleeding duodenal ulcer with pigmented  ?                          material. ?Moderate Sedation: ?     Not Applicable - Patient had care per Anesthesia. ?Recommendation:           - NPO. ?                          - Observe patient's clinical course. ?                          - Give Protonix (pantoprazole): 8 mg/hr IV by  ?                          continuous infusion. ?                          - Await pathology results. ?                          - Stop all NSAIDs. ?Procedure Code(s):        --- Professional --- ?                          [phone], Esophagogastroduodenoscopy, flexible,  ?                          transoral; with biopsy, single or multiple ?Diagnosis Code(s):        --- Professional --- ?                          K92.0, Hematemesis ?  K25.9, Gastric ulcer, unspecified as acute or  ?                          chronic, without hemorrhage or perforation ?                          K92.1, Melena (includes Hematochezia) ?                          K26.9, Duodenal ulcer, unspecified as acute or  ?                          chronic, without hemorrhage or perforation ?                          K29.00, Acute gastritis without bleeding ?                          K21.00, Gastro-esophageal reflux disease with  ?                          esophagitis, without bleeding ?                          R10.13, Epigastric pain ?CPT copyright 2019 American Medical Association. All rights reserved. ?The codes documented in this report are preliminary and upon coder review may  ?be revised to meet current compliance requirements. ?[name], MD ?04/12/2021 2:30:06 PM ?This report has been signed electronically. ?Number of Addenda: 0 ?

## 2021-04-12 NOTE — Progress Notes (Signed)
?  Carryover admission to the Day Admitter.  I discussed this case with the EDP, Domenic Moras. Per these discussions: ? ? ?This is a 51 year old male who is being admitted for acute upper gastrointestinal bleed associated with acute blood loss anemia after presenting with 5 days of epigastric pain with ensuing 2 days of new onset dark-colored stool as well as an episode of coffee-ground emesis.  Patient reportedly has been daily aspirin as well as daily ibuprofen.  No known history of underlying liver disease.  ? ?Reportedly hemodynamically stable.  Present hemoglobin 7, representing a decline from baseline. ? ?EDP discussed patient's case with on-call [facility] gastroenterologist, Dr. Michail Sermon, who will formally consult and see the patient this morning and request that patient be kept n.p.o. ? ?Transfusion of 2 units PRBC has been ordered, and the patient has received Protonix 40 mg IV x1. ? ?I have placed an order for observation to SDU for further evaluation management of acute upper gastrointestinal bleed, as above. ? ?I have placed some additional preliminary admit orders via the adult multi-morbid admission order set. I have also ordered n.p.o. status as well as Protonix drip.  Have also ordered repeat hemoglobin to be checked at 10 AM. ? ? ? ?Babs Bertin, DO ?Hospitalist ? ?

## 2021-04-12 NOTE — Transfer of Care (Signed)
Immediate Anesthesia Transfer of Care Note ? ?Patient: Jonathan Maynard ? ?Procedure(s) Performed: ESOPHAGOGASTRODUODENOSCOPY (EGD) ?BIOPSY ? ?Patient Location: PACU ? ?Anesthesia Type:MAC ? ?Level of Consciousness: awake, alert  and oriented ? ?Airway & Oxygen Therapy: Patient Spontanous Breathing and Patient connected to face mask oxygen ? ?Post-op Assessment: Report given to RN and Post -op Vital signs reviewed and stable ? ?Post vital signs: Reviewed and stable ? ?Last Vitals:  ?Vitals Value Taken Time  ?BP    ?Temp    ?Pulse    ?Resp 19 04/12/21 1424  ?SpO2    ?Vitals shown include unvalidated device data. ? ?Last Pain:  ?Vitals:  ? 04/12/21 1317  ?TempSrc: Temporal  ?PainSc: 0-No pain  ?   ? ?  ? ?Complications: No notable events documented. ?

## 2021-04-12 NOTE — H&P (Signed)
?History and Physical  ? ? ?Patient: Jonathan Maynard VOZ:[id number] DOB: 05/07/1970 ?DOA: 04/12/2021 ?DOS: the patient was seen and examined on 04/12/2021 ?PCP: Pcp, No  ?Patient coming from: Home ? ?Chief Complaint:  ?Chief Complaint  ?Patient presents with  ? Emesis  ? ?HPI: Jonathan Maynard is a 51 y.o. male with medical history significant of class I obesity who is coming to the emergency department with complaints of hematemesis and melena after having epigastric abdominal pain for the past 2 weeks for which he was taking ibuprofen and aspirin.  He has been feeling somnolent, but denied weakness, dizziness, palpitations or lower extremity edema.  He had rhinorrhea and pleuritic chest pain due to a "chest cold", which has almost resolved now.  Denies fever, chills, sore throat, wheezing or hemoptysis.  No flank pain, dysuria, frequency or hematuria.  No polyuria, polydipsia, polyphagia or blurred vision. ? ?ED course: Initial vital signs were temperature 98.4 ?F, pulse 106, respiration 18, BP 128/95 mmHg O2 sat 99% on room air.  The patient received morphine 4 mg IVP, ondansetron 4 mg IVP, pantoprazole 40 mg IVP followed by continuous pantoprazole infusion and 1000 mL of NS bolus. ? ?Lab work: His CBC*white count 10.6, hemoglobin 7.7 g/dL platelets 420.  Fecal occult blood was positive.  Lipase is normal.  CMP showed a sodium of 133 and chloride 97 mmol/L.  Glucose 117 and BUN 27 mg/dL.  The rest of the electrolytes, creatinine and LFTs were normal. ? ?Imaging: CT abdomen/pelvis with contrast shows suspicion for PUD in the stomach due to circumferential irregular wall thickening of the gastric antrum, mild hepatic asteatosis, prostatomegaly with mild bladder thickening or nondistention, diverticulosis without diverticulitis, umbilical and inguinal fat hernias.  There was an indeterminate 2 cm left adrenal nodule with recommendations for dedicated imaging from radiology.  Please see images and full  radiology report for further details. ?  ?Review of Systems: As mentioned in the history of present illness. All other systems reviewed and are negative. ?History reviewed. No pertinent past medical history. ?Past Surgical History:  ?Procedure Laterality Date  ? HERNIA REPAIR    ? ?Social History:  reports that he has quit smoking. His smoking use included cigarettes. He has a 17.50 pack-year smoking history. He has never used smokeless tobacco. He reports that he does not currently use alcohol. He reports that he does not use drugs. ? ?No Known Allergies ? ?Family History  ?Problem Relation Age of Onset  ? Diabetes Mother   ? ? ?Prior to Admission medications   ?Not on File  ? ? ?Physical Exam: ?Vitals:  ? 04/12/21 0428 04/12/21 0545 04/12/21 0700 04/12/21 0730  ?BP: (!) 128/95 128/81 114/65 115/65  ?Pulse: (!) 106 81 73 71  ?Resp: 18 18    ?Temp: 98.4 ?F (36.9 ?C)   98.2 ?F (36.8 ?C)  ?TempSrc: Oral     ?SpO2: 99% 100% 98% 95%  ?Weight:      ?Height:      ? ?Physical Exam ?Vitals and nursing note reviewed.  ?Constitutional:   ?   Appearance: Normal appearance. He is obese.  ?HENT:  ?   Head: Normocephalic.  ?   Mouth/Throat:  ?   Mouth: Mucous membranes are moist.  ?Eyes:  ?   General: No scleral icterus. ?   Pupils: Pupils are equal, round, and reactive to light.  ?Neck:  ?   Vascular: No JVD.  ?Cardiovascular:  ?   Rate and Rhythm: Normal rate and regular  rhythm.  ?   Heart sounds: S1 normal and S2 normal.  ?Pulmonary:  ?   Effort: Pulmonary effort is normal.  ?   Breath sounds: Normal breath sounds. No wheezing, rhonchi or rales.  ?Abdominal:  ?   General: Bowel sounds are normal. There is no distension.  ?   Palpations: Abdomen is soft.  ?   Tenderness: There is no abdominal tenderness. There is no guarding.  ?Musculoskeletal:  ?   Cervical back: Neck supple.  ?   Right lower leg: No edema.  ?   Left lower leg: No edema.  ?Skin: ?   General: Skin is warm and dry.  ?Neurological:  ?   General: No focal deficit  present.  ?   Mental Status: He is alert and oriented to person, place, and time.  ?Psychiatric:     ?   Mood and Affect: Mood normal.     ?   Behavior: Behavior normal.  ? ? ?Data Reviewed: ? ?There are no new results to review at this time. ? ?Assessment and Plan: ?Principal Problem: ?  Acute blood loss anemia ?Secondary to ?  Acute upper GI bleed ?Observation/stepdown. ?Keep NPO. ?Continue IVF. ?Continue PRBC transfusion. ?Continue pantoprazole infusion. ?Monitor hematocrit and hemoglobin. ?Transfuse as needed. ?Gastroenterology will scope later today. ? ?Active Problems: ?  Thrombocytosis ?Reactive due to GI bleed. ?Follow-up platelet count. ? ?  Hepatic steatosis ?Weight loss recommended. ?Avoid hepatotoxins. ? ?  Class 1 obesity ?Lifestyle modifications. ?Follow-up as an outpatient. ? ?  Diverticulosis ?High-fiber diet. ?Avoid seeds and nuts. ? ?  Prostate enlargement ?No significant GU symptoms. ?Follow-up as an outpatient. ? ?  Hyponatremia ?Due to GI losses. ?Continue IVF. ?Follow sodium level. ? ? ? ? ? Advance Care Planning:   Code Status: Full Code  ? ?Consults: [facility] (Dr Michail Sermon) ? ?Family Communication:  ? ?Severity of Illness: ?The appropriate patient status for this patient is OBSERVATION. Observation status is judged to be reasonable and necessary in order to provide the required intensity of service to ensure the patient's safety. The patient's presenting symptoms, physical exam findings, and initial radiographic and laboratory data in the context of their medical condition is felt to place them at decreased risk for further clinical deterioration. Furthermore, it is anticipated that the patient will be medically stable for discharge from the hospital within 2 midnights of admission.  ? ?Author: ?Reubin Milan, MD ?04/12/2021 8:53 AM ? ?For on call review [email or web address].  ? ?This document was prepared using Dragon voice recognition software and may contain some unintended transcription  errors. ?

## 2021-04-12 NOTE — ED Triage Notes (Signed)
Patient went to urgent care Tuesday because he was unable to have a bowel movement. He tried miralax but said it is not working. Then patient started vomiting yesterday and has not been able to keep anything down since. ?

## 2021-04-13 DIAGNOSIS — E278 Other specified disorders of adrenal gland: Secondary | ICD-10-CM

## 2021-04-13 DIAGNOSIS — K259 Gastric ulcer, unspecified as acute or chronic, without hemorrhage or perforation: Secondary | ICD-10-CM

## 2021-04-13 DIAGNOSIS — K922 Gastrointestinal hemorrhage, unspecified: Secondary | ICD-10-CM | POA: Diagnosis not present

## 2021-04-13 DIAGNOSIS — K2970 Gastritis, unspecified, without bleeding: Secondary | ICD-10-CM

## 2021-04-13 DIAGNOSIS — K209 Esophagitis, unspecified without bleeding: Secondary | ICD-10-CM

## 2021-04-13 LAB — CBC WITH DIFFERENTIAL/PLATELET
Abs Immature Granulocytes: 0.07 10*3/uL (ref 0.00–0.07)
Basophils Absolute: 0.1 10*3/uL (ref 0.0–0.1)
Basophils Relative: 1 %
Eosinophils Absolute: 0.2 10*3/uL (ref 0.0–0.5)
Eosinophils Relative: 2 %
HCT: 23 % — ABNORMAL LOW (ref 39.0–52.0)
Hemoglobin: 7.4 g/dL — ABNORMAL LOW (ref 13.0–17.0)
Immature Granulocytes: 1 %
Lymphocytes Relative: 30 %
Lymphs Abs: 2.9 10*3/uL (ref 0.7–4.0)
MCH: 28.9 pg (ref 26.0–34.0)
MCHC: 32.2 g/dL (ref 30.0–36.0)
MCV: 89.8 fL (ref 80.0–100.0)
Monocytes Absolute: 0.7 10*3/uL (ref 0.1–1.0)
Monocytes Relative: 8 %
Neutro Abs: 5.7 10*3/uL (ref 1.7–7.7)
Neutrophils Relative %: 58 %
Platelets: 329 10*3/uL (ref 150–400)
RBC: 2.56 MIL/uL — ABNORMAL LOW (ref 4.22–5.81)
RDW: 15.6 % — ABNORMAL HIGH (ref 11.5–15.5)
WBC: 9.7 10*3/uL (ref 4.0–10.5)
nRBC: 0 % (ref 0.0–0.2)

## 2021-04-13 LAB — HEMOGLOBIN AND HEMATOCRIT, BLOOD
HCT: 21.4 % — ABNORMAL LOW (ref 39.0–52.0)
Hemoglobin: 6.9 g/dL — CL (ref 13.0–17.0)

## 2021-04-13 LAB — COMPREHENSIVE METABOLIC PANEL
ALT: 9 U/L (ref 0–44)
AST: 15 U/L (ref 15–41)
Albumin: 3.2 g/dL — ABNORMAL LOW (ref 3.5–5.0)
Alkaline Phosphatase: 57 U/L (ref 38–126)
Anion gap: 7 (ref 5–15)
BUN: 19 mg/dL (ref 6–20)
CO2: 26 mmol/L (ref 22–32)
Calcium: 8.5 mg/dL — ABNORMAL LOW (ref 8.9–10.3)
Chloride: 101 mmol/L (ref 98–111)
Creatinine, Ser: 1.16 mg/dL (ref 0.61–1.24)
GFR, Estimated: >60 mL/min (ref >60)
Glucose, Bld: 85 mg/dL (ref 70–99)
Potassium: 4.7 mmol/L (ref 3.5–5.1)
Sodium: 134 mmol/L — ABNORMAL LOW (ref 135–145)
Total Bilirubin: 0.7 mg/dL (ref 0.3–1.2)
Total Protein: 6.2 g/dL — ABNORMAL LOW (ref 6.5–8.1)

## 2021-04-13 LAB — FERRITIN: Ferritin: 11 ng/mL — ABNORMAL LOW (ref 24–336)

## 2021-04-13 LAB — VITAMIN B12: Vitamin B-12: 331 pg/mL (ref 180–914)

## 2021-04-13 LAB — IRON AND TIBC
Iron: 21 ug/dL — ABNORMAL LOW (ref 45–182)
Saturation Ratios: 6 % — ABNORMAL LOW (ref 17.9–39.5)
TIBC: 363 ug/dL (ref 250–450)
UIBC: 342 ug/dL

## 2021-04-13 LAB — FOLATE: Folate: 11.1 ng/mL (ref >5.9)

## 2021-04-13 LAB — PREPARE RBC (CROSSMATCH)

## 2021-04-13 MED ORDER — SODIUM CHLORIDE 0.9% IV SOLUTION: Freq: Once | INTRAVENOUS | Status: DC

## 2021-04-13 NOTE — TOC Initial Note (Signed)
Transition of Care (TOC) - Initial/Assessment Note  ? ? ?Patient Details  ?Name: Jonathan Maynard ?MRN: [id number] ?Date of Birth: October 09, 1970 ? ?Transition of Care (TOC) CM/SW Contact:    ?Tawanna Cooler, RN ?Phone Number: ?04/13/2021, 2:11 PM ? ?Clinical Narrative:                 ? ? ?Transition of Care Department (TOC) has reviewed patient and no TOC needs have been identified at this time. We will continue to monitor patient advancement through interdisciplinary progression rounds. If new patient transition needs arise, please place a TOC consult. ? ? ? ? ?Expected Discharge Plan: Home/Self Care ?Barriers to Discharge: Continued Medical Work up ? ?Expected Discharge Plan and Services ?Expected Discharge Plan: Home/Self Care ?  ?   ?Living arrangements for the past 2 months: [place] ?                ?  ?Prior Living Arrangements/Services ?Living arrangements for the past 2 months: [employer] ?Lives with:: Spouse ?Patient language and need for interpreter reviewed:: Yes ?       ?Need for Family Participation in Patient Care: No (Comment) ?Care giver support system in place?: No (comment) ?  ?Criminal Activity/Legal Involvement Pertinent to Current Situation/Hospitalization: No - Comment as needed ? ?Activities of Daily Living ?Home Assistive Devices/Equipment: None ?ADL Screening (condition at time of admission) ?Patient's cognitive ability adequate to safely complete daily activities?: Yes ?Is the patient deaf or have difficulty hearing?: No ?Does the patient have difficulty seeing, even when wearing glasses/contacts?: No ?Does the patient have difficulty concentrating, remembering, or making decisions?: No ?Patient able to express need for assistance with ADLs?: Yes ?Does the patient have difficulty dressing or bathing?: No ?Independently performs ADLs?: Yes (appropriate for developmental age) ?Does the patient have difficulty walking or climbing stairs?: No ?Weakness of Legs:  None ?Weakness of Arms/Hands: None ? ?Emotional Assessment ?  ?Orientation: : Oriented to Self, Oriented to Place, Oriented to  Time, Oriented to Situation ?Alcohol / Substance Use: Not Applicable ?Psych Involvement: No (comment) ? ?Admission diagnosis:  Acute upper gastrointestinal bleeding [K92.2] ?Acute upper GI bleed [K92.2] ?Symptomatic anemia [D64.9] ?Patient Active Problem List  ? Diagnosis Date Noted  ? Adrenal nodule ([place]) 04/13/2021  ? Gastritis 04/13/2021  ? Multiple gastric ulcers 04/13/2021  ? Esophagitis 04/13/2021  ? Acute upper GI bleed 04/12/2021  ? Hepatic steatosis 04/12/2021  ? Class 1 obesity 04/12/2021  ? Diverticulosis 04/12/2021  ? Prostate enlargement 04/12/2021  ? Hyponatremia 04/12/2021  ? Acute blood loss anemia 04/12/2021  ? Thrombocytosis 04/12/2021  ? ?PCP:  Pcp, No ?Pharmacy:   ?[facility] ?[address] ?[place] [address] ?Phone: [phone] Fax: [phone] ? ? ?

## 2021-04-13 NOTE — Progress Notes (Signed)
[facility] Progress Note ? ?Jonathan Maynard 50 y.o. 1970-07-01 ? ? ?Subjective: ?Feels better. Denies abdominal pain, melena, BMs, N/V. Tolerating clear liquid diet.  ? ?Objective: ?Vital signs: ?Vitals:  ? 04/13/21 1300 04/13/21 1400  ?BP: (!) 109/50 112/61  ?Pulse: 79 83  ?Resp: 16 16  ?Temp:    ?SpO2: 99% 100%  ?T 98.1 ? ?Physical Exam: ?Gen: lethargic, no acute distress, pleasant ?HEENT: anicteric sclera, poor dentition ?CV: RRR ?Chest: CTA B ?Abd: soft, nontender, nondistended, +BS ?Ext: no edema ? ?Lab Results: ?Recent Labs  ?  04/12/21 ?8937 04/13/21 ?0825  ?NA 133* 134*  ?K 4.7 4.7  ?CL 97* 101  ?CO2 26 26  ?GLUCOSE 117* 85  ?BUN 27* 19  ?CREATININE 1.01 1.16  ?CALCIUM 9.1 8.5*  ? ?Recent Labs  ?  04/12/21 ?0435 04/13/21 ?0825  ?AST 15 15  ?ALT 11 9  ?ALKPHOS 69 57  ?BILITOT 0.6 0.7  ?PROT 7.7 6.2*  ?ALBUMIN 4.1 3.2*  ? ?Recent Labs  ?  04/12/21 ?0435 04/12/21 ?1300 04/12/21 ?1631 04/13/21 ?0825  ?WBC 10.6*  --   --  9.7  ?NEUTROABS  --   --   --  5.7  ?HGB 7.7*   < > 7.9* 7.4*  ?HCT 24.3*   < > 24.0* 23.0*  ?MCV 88.4  --   --  89.8  ?PLT 420*  --   --  329  ? < > = values in this interval not displayed.  ? ? ? ? ?Assessment/Plan: ?Large antral and pyloric channel ulcers likely NSAID-related. Biopsies pending. Continue Protonix drip until 04/15/21. Continue clears today but can change to full liquid diet tomorrow if stable. Supportive care. Strongly advised to stop all NSAIDs. ? ? ?[name] ?04/13/2021, 3:02 PM ? ?Questions please call [phone] Patient ID: Jonathan Maynard, male   DOB: 03/14/1970, 51 y.o.   MRN: [id number] ? ?

## 2021-04-13 NOTE — Hospital Course (Signed)
51 yo with hx obesity presented with hematemesis and melena, now s/p EGD showing partially obstructing non bleeding gastric ulcers, non bleeding duodenal ulcer, and grade Jaydence Arnesen esophagitis.  GI following. ?

## 2021-04-13 NOTE — Assessment & Plan Note (Signed)
follow

## 2021-04-13 NOTE — Assessment & Plan Note (Addendum)
EGD with LA grade Trentan Trippe reflux esophagitis, partially obstructing non bleeding gastric ulcers with pigmented material, concerning for NSAID induced etiology.  Perforation can't be ruled out.  Nonbleeding duodenal ulcer.  Acute gastritis, biopsied. ?Biopsy pending ?Appreciate GI recommendations -> BID PPI, ADAT -> PPI bid x 6 weeks, then daily indefinitely, no ASA/NSAIDS - bx pending (follow outpatient) ?

## 2021-04-13 NOTE — Assessment & Plan Note (Signed)
Noted, follow outpatient ?

## 2021-04-13 NOTE — Assessment & Plan Note (Signed)
Follow with PCP outpatient 

## 2021-04-13 NOTE — Progress Notes (Signed)
?PROGRESS NOTE ? ? ? ?Jonathan Maynard  VZD:[id number] DOB: Dec 07, 1970 DOA: 04/12/2021 ?PCP: Pcp, No  ?Chief Complaint  ?Patient presents with  ? Emesis  ? ? ?Brief Narrative:  ?51 yo with hx obesity presented with hematemesis and melena, now s/p EGD showing partially obstructing non bleeding gastric ulcers, non bleeding duodenal ulcer, and grade Annemarie Sebree esophagitis.  GI following.  ? ? ?Assessment & Plan: ?  ?Principal Problem: ?  Acute upper GI bleed ?Active Problems: ?  Acute blood loss anemia ?  Adrenal nodule ([place]) ?  Hepatic steatosis ?  Class 1 obesity ?  Hyponatremia ?  Diverticulosis ?  Prostate enlargement ?  Thrombocytosis ?  Gastritis ?  Multiple gastric ulcers ?  Esophagitis ? ? ?Assessment and Plan: ?* Acute upper GI bleed ?EGD with LA grade Jarron Curley reflux esophagitis, partially obstructing non bleeding gastric ulcers with pigmented material, concerning for NSAID induced etiology.  Perforation can't be ruled out.  Nonbleeding duodenal ulcer.  Acute gastritis, biopsied. ?Biopsy pending ?Appreciate GI recommendations -> continue Npo for now, PPI gtt.  Stop all NSAIDs ? ?Acute blood loss anemia ?Hb pending from this AM ?Stable yesterday ?Transfuse for <7 or symptomatic  ? ?Adrenal nodule ([place]) ?Noted on CT abd/pelvis ?Recommended dedicated adrenal imaging to follow  ? ?Hyponatremia ?Mild, follow ? ?Hepatic steatosis ?Follow with PCP outpatient ? ?Prostate enlargement ?Noted, follow outpatient ? ?Thrombocytosis ?follow ? ? ?DVT prophylaxis: SCD ?Code Status: full ?Family Communication: none ?Disposition:  ? ?Status is: Inpatient ?Remains inpatient appropriate because: awaiting GI clearance ?  ?Consultants:  ?GI ? ?Procedures:  ?S/p EGD ? ?Antimicrobials:  ?Anti-infectives (From admission, onward)  ? ? None  ? ?  ? ? ?Subjective: ?Frustrated, asking why he can't eat and wants to know when he can eat ? ?Objective: ?Vitals:  ? 04/13/21 0500 04/13/21 0600 04/13/21 0700 04/13/21 0801  ?BP: (!) 109/59 (!) 102/53 100/62    ?Pulse: 64 62 62   ?Resp: '12 13 12   '$ ?Temp:    98.1 ?F (36.7 ?C)  ?TempSrc:    Oral  ?SpO2: 97% 96% 96%   ?Weight:      ?Height:      ? ? ?Intake/Output Summary (Last 24 hours) at 04/13/2021 0820 ?Last data filed at 04/13/2021 0600 ?Gross per 24 hour  ?Intake 2145.66 ml  ?Output 1050 ml  ?Net 1095.66 ml  ? ?Filed Weights  ? 04/12/21 1317 04/12/21 1500 04/13/21 0135  ?Weight: 90.7 kg 90.2 kg 90.9 kg  ? ? ?Examination: ? ?General exam: Appears calm and comfortable  ?Respiratory system: unlabored ?Cardiovascular system: RRR ?Gastrointestinal system: Abdomen is nondistended, soft and nontender.  ?Central nervous system: Alert and oriented. No focal neurological deficits. ?Extremities: no LEE ?Skin: No rashes, lesions or ulcers ?Psychiatry: Judgement and insight appear normal. Mood & affect appropriate.  ? ? ? ?Data Reviewed: I have personally reviewed following labs and imaging studies ? ?CBC: ?Recent Labs  ?Lab 04/12/21 ?0435 04/12/21 ?1300 04/12/21 ?2951  ?WBC 10.6*  --   --   ?HGB 7.7* 7.9* 7.9*  ?HCT 24.3* 23.4* 24.0*  ?MCV 88.4  --   --   ?PLT 420*  --   --   ? ? ?Basic Metabolic Panel: ?Recent Labs  ?Lab 04/12/21 ?8841  ?NA 133*  ?K 4.7  ?CL 97*  ?CO2 26  ?GLUCOSE 117*  ?BUN 27*  ?CREATININE 1.01  ?CALCIUM 9.1  ? ? ?GFR: ?Estimated Creatinine Clearance: 90.7 mL/min (by C-G formula based on SCr of 1.01 mg/dL). ? ?  Liver Function Tests: ?Recent Labs  ?Lab 04/12/21 ?0165  ?AST 15  ?ALT 11  ?ALKPHOS 69  ?BILITOT 0.6  ?PROT 7.7  ?ALBUMIN 4.1  ? ? ?CBG: ?No results for input(s): GLUCAP in the last 168 hours. ? ? ?Recent Results (from the past 240 hour(s))  ?MRSA Next Gen by PCR, Nasal     Status: None  ? Collection Time: 04/12/21  3:03 PM  ? Specimen: Nasal Mucosa; Nasal Swab  ?Result Value Ref Range Status  ? MRSA by PCR Next Gen NOT DETECTED NOT DETECTED Final  ?  Comment: (NOTE) ?The GeneXpert MRSA Assay (FDA approved for NASAL specimens only), ?is one component of Cyrena Kuchenbecker comprehensive MRSA colonization  surveillance ?program. It is not intended to diagnose MRSA infection nor to guide ?or monitor treatment for MRSA infections. ?Test performance is not FDA approved in patients less than 2 years ?old. ?Performed at [facility], [address] ?[place], [place] [address] ?  ?  ? ? ? ? ? ?Radiology Studies: ?CT ABDOMEN PELVIS W CONTRAST ? ?Result Date: 04/12/2021 ?CLINICAL DATA:  Acute abdominal pain. EXAM: CT ABDOMEN AND PELVIS WITH CONTRAST TECHNIQUE: Multidetector CT imaging of the abdomen and pelvis was performed using the standard protocol following bolus administration of intravenous contrast. RADIATION DOSE REDUCTION: This exam was performed according to the departmental dose-optimization program which includes automated exposure control, adjustment of the mA and/or kV according to patient size and/or use of iterative reconstruction technique. CONTRAST:  190m OMNIPAQUE IOHEXOL 300 MG/ML  SOLN COMPARISON:  None. FINDINGS: Lower chest: Lung bases are clear of infiltrates. There is Zakya Halabi calcified granuloma laterally in the left lower lobe. The cardiac size is normal. Hepatobiliary: The liver 15.5 cm length mildly steatotic without mass enhancement. Unremarkable gallbladder, bile ducts. Pancreas: No focal abnormality. Spleen: No focal abnormality. Adrenals/Urinary Tract: 2 cm indeterminate left adrenal mass. Further evaluation recommended. There is no right adrenal mass. No significant abnormality of the renal cortex. There is Raylen Tangonan small wedge-shaped infarct defect in the superior pole right kidney. There is no urinary stone or obstruction. There is mild bladder thickening versus nondistention. There is Jernie Schutt nodular protrusion of the prostate gland against the bladder base. Stomach/Bowel: The stomach is distended with fluid and there is moderate irregular circumferential thickening of the distal gastric antrum. The unopacified small bowel is unremarkable. The appendix is normal and well seen. There is mild  stool retention. There are colonic diverticula without evidence of diverticulitis. Vascular/Lymphatic: Minimal aortic atherosclerosis. No AAA. There are enlarged mesenteric lymph nodes in the hypogastrium centered below the thickened gastric antrum and measuring up to 1.4 cm in short axis. Mildly prominent portacaval and periportal nodes are noted up to 1 cm in short axis. There is no pelvic adenopathy. Reproductive: Enlarged prostate measures 4.8 cm, as above there is Sandar Krinke nodular protrusion superiorly against the bladder base. Clinical correlation recommended. Other: Small umbilical and bilateral inguinal fat hernias. There are pelvic phleboliths. There is no free air, hemorrhage or fluid. Musculoskeletal: There are mild degenerative changes of the spine and hips. No acute or significant osseous findings. IMPRESSION: 1. There is circumferential irregular wall thickening of the gastric antrum which significantly narrows the distal gastric lumen and could be partially obstructing gastric emptying as the more proximal stomach is distended with fluid. Severe peptic ulcer disease and infiltrating disease are both possibilities and there is underlying adenopathy in the hypogastrium. Follow-up endoscopy is recommended. 2. Mild hepatic steatosis. 3. Prostatomegaly with mild bladder thickening or nondistention.  4. Diverticulosis without diverticulitis. 5. Umbilical and inguinal fat hernias. 6. Indeterminate 2 cm left adrenal nodule. Adrenal dedicated imaging recommended. Electronically Signed   By: Telford Nab M.D.   On: 04/12/2021 06:29   ? ? ? ? ? ?Scheduled Meds: ? Chlorhexidine Gluconate Cloth  6 each Topical Daily  ? [START ON 04/15/2021] pantoprazole  40 mg Intravenous Q12H  ? ?Continuous Infusions: ? lactated ringers 100 mL/hr at 04/13/21 0600  ? pantoprazole 8 mg/hr (04/13/21 0600)  ? ? ? LOS: 1 day  ? ? ?Time spent: over 30 min ? ? ? ?Fayrene Helper, MD ?[facility] ? ? ?To contact the attending provider  between 7A-7P or the covering provider during after hours 7P-7A, please log into the web site [email or web address] and access using universal [place] password for that web site. If you do not have the password, please call t

## 2021-04-13 NOTE — Assessment & Plan Note (Signed)
Mild, follow °

## 2021-04-13 NOTE — Progress Notes (Signed)
Date and time results received: 04/13/21 1816 ?(use smartphrase ".now" to insert current time) ? ?Test: Hgb ?Critical Value: 6.9 ? ?Name of Provider Notified: Dr. Florene Glen ? ?Orders Received? Or Actions Taken?: MD notified ?

## 2021-04-13 NOTE — Assessment & Plan Note (Addendum)
Stable on day of discharge ?Iron deficiency -> IV iron on day of discharge, will discharge with oral iron ?S/ 2 units pRBC's during this admission (4/7 and 4/9) ?Transfuse for <7 or symptomatic  ?

## 2021-04-13 NOTE — Assessment & Plan Note (Addendum)
Noted on CT abd/pelvis ?Recommended dedicated adrenal imaging to follow  ?

## 2021-04-14 DIAGNOSIS — K922 Gastrointestinal hemorrhage, unspecified: Secondary | ICD-10-CM | POA: Diagnosis not present

## 2021-04-14 LAB — BASIC METABOLIC PANEL
Anion gap: 6 (ref 5–15)
BUN: 15 mg/dL (ref 6–20)
CO2: 29 mmol/L (ref 22–32)
Calcium: 8.7 mg/dL — ABNORMAL LOW (ref 8.9–10.3)
Chloride: 100 mmol/L (ref 98–111)
Creatinine, Ser: 1.08 mg/dL (ref 0.61–1.24)
GFR, Estimated: >60 mL/min (ref >60)
Glucose, Bld: 94 mg/dL (ref 70–99)
Potassium: 4.4 mmol/L (ref 3.5–5.1)
Sodium: 135 mmol/L (ref 135–145)

## 2021-04-14 LAB — CBC
HCT: 24.2 % — ABNORMAL LOW (ref 39.0–52.0)
Hemoglobin: 7.9 g/dL — ABNORMAL LOW (ref 13.0–17.0)
MCH: 28.8 pg (ref 26.0–34.0)
MCHC: 32.6 g/dL (ref 30.0–36.0)
MCV: 88.3 fL (ref 80.0–100.0)
Platelets: 315 10*3/uL (ref 150–400)
RBC: 2.74 MIL/uL — ABNORMAL LOW (ref 4.22–5.81)
RDW: 15 % (ref 11.5–15.5)
WBC: 9 10*3/uL (ref 4.0–10.5)
nRBC: 0 % (ref 0.0–0.2)

## 2021-04-14 LAB — HEMOGLOBIN AND HEMATOCRIT, BLOOD
HCT: 23.5 % — ABNORMAL LOW (ref 39.0–52.0)
Hemoglobin: 7.7 g/dL — ABNORMAL LOW (ref 13.0–17.0)

## 2021-04-14 LAB — PHOSPHORUS: Phosphorus: 4.7 mg/dL — ABNORMAL HIGH (ref 2.5–4.6)

## 2021-04-14 LAB — MAGNESIUM: Magnesium: 1.9 mg/dL (ref 1.7–2.4)

## 2021-04-14 NOTE — Progress Notes (Addendum)
[facility] Progress Note ? ?Jonathan Maynard 51 y.o. [birth year] ? ? ?Subjective: ?Denies abdominal pain, BMs or melena overnight. Tolerating liquid diet. Sitting on bedside chair. Wife in room. ? ?Objective: ?Vital signs: ?Vitals:  ? 04/13/21 2230 04/14/21 0637  ?BP: 102/61 114/62  ?Pulse: (!) 59 68  ?Resp: 18 18  ?Temp: 98.1 ?F (36.7 ?C) 98.5 ?F (36.9 ?C)  ?SpO2: 100% 100%  ? ? ?Physical Exam: ?Gen: lethargic, no acute distress, well-nourished, pleasant ?HEENT: anicteric sclera ?CV: RRR ?Chest: CTA B ?Abd: soft, nontender, nondistended, +BS ?Ext: no edema ? ?Lab Results: ?Recent Labs  ?  04/13/21 ?0825 04/14/21 ?2229  ?NA 134* 135  ?K 4.7 4.4  ?CL 101 100  ?CO2 26 29  ?GLUCOSE 85 94  ?BUN 19 15  ?CREATININE 1.16 1.08  ?CALCIUM 8.5* 8.7*  ?MG  --  1.9  ?PHOS  --  4.7*  ? ?Recent Labs  ?  04/12/21 ?0435 04/13/21 ?0825  ?AST 15 15  ?ALT 11 9  ?ALKPHOS 69 57  ?BILITOT 0.6 0.7  ?PROT 7.7 6.2*  ?ALBUMIN 4.1 3.2*  ? ?Recent Labs  ?  04/13/21 ?0825 04/13/21 ?1752 04/14/21 ?7989  ?WBC 9.7  --  9.0  ?NEUTROABS 5.7  --   --   ?HGB 7.4* 6.9* 7.9*  ?HCT 23.0* 21.4* 24.2*  ?MCV 89.8  --  88.3  ?PLT 329  --  315  ? ? ? ? ?Assessment/Plan: ?Complicated large gastric ulcers (antral and pyloric channel) likely NSAID-induced. Biopsies pending. Hgb drop to 6.9 yesterday and transfused 1 U PRBCs to 7.9. Continue Protonix drip until tomorrow and then change to IV PPI Q 12 hours if stable. Full liquid diet today and slowly advance as tolerated. [facility] will f/u tomorrow. ? ? ?[name] ?04/14/2021, 10:47 AM ? ?Questions please call [phone] Patient ID: Jonathan Maynard, male   DOB: 05/11/1970, 51 y.o.   MRN: [id number] ? ?

## 2021-04-14 NOTE — Progress Notes (Signed)
?PROGRESS NOTE ? ? ? ?Jonathan Maynard  BUL:[id number] DOB: October 14, 1970 DOA: 04/12/2021 ?PCP: Pcp, No  ?Chief Complaint  ?Patient presents with  ? Emesis  ? ? ?Brief Narrative:  ?51 yo with hx obesity presented with hematemesis and melena, now s/p EGD showing partially obstructing non bleeding gastric ulcers, non bleeding duodenal ulcer, and grade Goerge Mohr esophagitis.  GI following.  ? ? ?Assessment & Plan: ?  ?Principal Problem: ?  Acute upper GI bleed ?Active Problems: ?  Acute blood loss anemia ?  Adrenal nodule ([place]) ?  Hepatic steatosis ?  Class 1 obesity ?  Hyponatremia ?  Diverticulosis ?  Prostate enlargement ?  Thrombocytosis ?  Gastritis ?  Multiple gastric ulcers ?  Esophagitis ? ? ?Assessment and Plan: ?* Acute upper GI bleed ?EGD with LA grade Osama Coleson reflux esophagitis, partially obstructing non bleeding gastric ulcers with pigmented material, concerning for NSAID induced etiology.  Perforation can't be ruled out.  Nonbleeding duodenal ulcer.  Acute gastritis, biopsied. ?Biopsy pending ?Appreciate GI recommendations -> PPI gtt until 4/10.  Stop all NSAIDs.  Full liquids today. ? ?Acute blood loss anemia ?Hb pending from this AM ?S/ 2 units pRBC's during this admission (4/7 and 4/9) ?Transfuse for <7 or symptomatic  ? ?Adrenal nodule ([place]) ?Noted on CT abd/pelvis ?Recommended dedicated adrenal imaging to follow  ? ?Hyponatremia ?Mild, follow ? ?Hepatic steatosis ?Follow with PCP outpatient ? ?Prostate enlargement ?Noted, follow outpatient ? ?Thrombocytosis ?follow ? ? ?DVT prophylaxis: SCD ?Code Status: full ?Family Communication: none ?Disposition:  ? ?Status is: Inpatient ?Remains inpatient appropriate because: awaiting GI clearance ?  ?Consultants:  ?GI ? ?Procedures:  ?S/p EGD ? ?Antimicrobials:  ?Anti-infectives (From admission, onward)  ? ? None  ? ?  ? ? ?Subjective: ?No new complaints ? ?Objective: ?Vitals:  ? 04/13/21 2027 04/13/21 2230 04/14/21 0500 04/14/21 3212  ?BP: (!) 102/52 102/61  114/62   ?Pulse: 66 (!) 59  68  ?Resp: '18 18  18  '$ ?Temp: 98.7 ?F (37.1 ?C) 98.1 ?F (36.7 ?C)  98.5 ?F (36.9 ?C)  ?TempSrc: Oral Oral  Oral  ?SpO2: 98% 100%  100%  ?Weight:   90 kg   ?Height:      ? ? ?Intake/Output Summary (Last 24 hours) at 04/14/2021 0910 ?Last data filed at 04/13/2021 2230 ?Gross per 24 hour  ?Intake 1002.9 ml  ?Output 375 ml  ?Net 627.9 ml  ? ?Filed Weights  ? 04/12/21 1500 04/13/21 0135 04/14/21 0500  ?Weight: 90.2 kg 90.9 kg 90 kg  ? ? ?Examination: ? ?General: No acute distress.  Sitting up, eating breakfast - pudding, yogurt ?Cardiovascular: RRR ?Lungs:unlabored ?Neurological: Alert and oriented ?3. Moves all extremities ?4. Cranial nerves II through XII grossly intact. ?Extremities: No clubbing or cyanosis. No edema.  ? ? ?Data Reviewed: I have personally reviewed following labs and imaging studies ? ?CBC: ?Recent Labs  ?Lab 04/12/21 ?0435 04/12/21 ?1300 04/12/21 ?1631 04/13/21 ?0825 04/13/21 ?1752 04/14/21 ?2482  ?WBC 10.6*  --   --  9.7  --  9.0  ?NEUTROABS  --   --   --  5.7  --   --   ?HGB 7.7* 7.9* 7.9* 7.4* 6.9* 7.9*  ?HCT 24.3* 23.4* 24.0* 23.0* 21.4* 24.2*  ?MCV 88.4  --   --  89.8  --  88.3  ?PLT 420*  --   --  329  --  315  ? ? ?Basic Metabolic Panel: ?Recent Labs  ?Lab 04/12/21 ?5003 04/13/21 ?0825 04/14/21 ?7048  ?  NA 133* 134* 135  ?K 4.7 4.7 4.4  ?CL 97* 101 100  ?CO2 '26 26 29  '$ ?GLUCOSE 117* 85 94  ?BUN 27* 19 15  ?CREATININE 1.01 1.16 1.08  ?CALCIUM 9.1 8.5* 8.7*  ?MG  --   --  1.9  ?PHOS  --   --  4.7*  ? ? ?GFR: ?Estimated Creatinine Clearance: 84.4 mL/min (by C-G formula based on SCr of 1.08 mg/dL). ? ?Liver Function Tests: ?Recent Labs  ?Lab 04/12/21 ?0435 04/13/21 ?0825  ?AST 15 15  ?ALT 11 9  ?ALKPHOS 69 57  ?BILITOT 0.6 0.7  ?PROT 7.7 6.2*  ?ALBUMIN 4.1 3.2*  ? ? ?CBG: ?No results for input(s): GLUCAP in the last 168 hours. ? ? ?Recent Results (from the past 240 hour(s))  ?MRSA Next Gen by PCR, Nasal     Status: None  ? Collection Time: 04/12/21  3:03 PM  ? Specimen: Nasal Mucosa;  Nasal Swab  ?Result Value Ref Range Status  ? MRSA by PCR Next Gen NOT DETECTED NOT DETECTED Final  ?  Comment: (NOTE) ?The GeneXpert MRSA Assay (FDA approved for NASAL specimens only), ?is one component of Oran Dillenburg comprehensive MRSA colonization surveillance ?program. It is not intended to diagnose MRSA infection nor to guide ?or monitor treatment for MRSA infections. ?Test performance is not FDA approved in patients less than 2 years ?old. ?Performed at [facility], [address] ?[place], [place] [address] ?  ?  ? ? ? ? ? ?Radiology Studies: ?No results found. ? ? ? ? ? ?Scheduled Meds: ? sodium chloride   Intravenous Once  ? Chlorhexidine Gluconate Cloth  6 each Topical Daily  ? [START ON 04/15/2021] pantoprazole  40 mg Intravenous Q12H  ? ?Continuous Infusions: ? lactated ringers 100 mL/hr at 04/14/21 0659  ? pantoprazole 8 mg/hr (04/14/21 0152)  ? ? ? LOS: 2 days  ? ? ?Time spent: over 30 min ? ? ? ?Fayrene Helper, MD ?[facility] ? ? ?To contact the attending provider between 7A-7P or the covering provider during after hours 7P-7A, please log into the web site [email or web address] and access using universal [place] password for that web site. If you do not have the password, please call the hospital operator. ? ?04/14/2021, 9:10 AM  ? ? ?

## 2021-04-15 DIAGNOSIS — K922 Gastrointestinal hemorrhage, unspecified: Secondary | ICD-10-CM | POA: Diagnosis not present

## 2021-04-15 LAB — PHOSPHORUS: Phosphorus: 4.6 mg/dL (ref 2.5–4.6)

## 2021-04-15 LAB — CBC
HCT: 23.9 % — ABNORMAL LOW (ref 39.0–52.0)
Hemoglobin: 7.7 g/dL — ABNORMAL LOW (ref 13.0–17.0)
MCH: 28.5 pg (ref 26.0–34.0)
MCHC: 32.2 g/dL (ref 30.0–36.0)
MCV: 88.5 fL (ref 80.0–100.0)
Platelets: 320 10*3/uL (ref 150–400)
RBC: 2.7 MIL/uL — ABNORMAL LOW (ref 4.22–5.81)
RDW: 14.8 % (ref 11.5–15.5)
WBC: 8.8 10*3/uL (ref 4.0–10.5)
nRBC: 0 % (ref 0.0–0.2)

## 2021-04-15 LAB — BASIC METABOLIC PANEL
Anion gap: 6 (ref 5–15)
BUN: 14 mg/dL (ref 6–20)
CO2: 26 mmol/L (ref 22–32)
Calcium: 8.8 mg/dL — ABNORMAL LOW (ref 8.9–10.3)
Chloride: 104 mmol/L (ref 98–111)
Creatinine, Ser: 1.04 mg/dL (ref 0.61–1.24)
GFR, Estimated: >60 mL/min (ref >60)
Glucose, Bld: 104 mg/dL — ABNORMAL HIGH (ref 70–99)
Potassium: 4.3 mmol/L (ref 3.5–5.1)
Sodium: 136 mmol/L (ref 135–145)

## 2021-04-15 LAB — MAGNESIUM: Magnesium: 2 mg/dL (ref 1.7–2.4)

## 2021-04-15 MED ORDER — PANTOPRAZOLE SODIUM 40 MG PO TBEC
40.0000 mg | DELAYED_RELEASE_TABLET | Freq: Two times a day (BID) | ORAL | Status: DC
  Administered 2021-04-15 – 2021-04-16 (×2): 40 mg via ORAL
  Filled 2021-04-15 (×2): qty 1

## 2021-04-15 NOTE — Progress Notes (Signed)
[facility] Progress Note ? ?Jonathan Maynard 51 y.o. 09-23-1970 ? ?CC:  Upper GI bleed ? ? ?Subjective: ?Patient resting comfortably in chair this morning.  Feeling well.  Had a dark bowel movement this morning but denies abdominal pain, nausea, vomiting, heartburn, hematochezia.  Tolerating diet.  Has been up and walking the halls.  ? ?ROS : Review of Systems  ?Constitutional:  Negative for chills and fever.  ?Gastrointestinal:  Positive for melena. Negative for abdominal pain, blood in stool, constipation, diarrhea, heartburn, nausea and vomiting.  ?Genitourinary:  Negative for dysuria and urgency.   ? ? ?Objective: ?Vital signs in last 24 hours: ?Vitals:  ? 04/14/21 1943 04/15/21 0544  ?BP: (!) 104/56 108/63  ?Pulse: 67 64  ?Resp: 18 18  ?Temp: 98.7 ?F (37.1 ?C) 97.6 ?F (36.4 ?C)  ?SpO2: 100% 100%  ? ? ?Physical Exam: ? ?General:  Alert, cooperative, no distress, pleasant  ?Head:  Normocephalic, without obvious abnormality, atraumatic  ?Eyes:  Anicteric sclera, EOM's intact  ?Lungs:   Clear to auscultation bilaterally, respirations unlabored  ?Heart:  Regular rate and rhythm, S1, S2 normal  ?Abdomen:   Soft, non-tender, bowel sounds active all four quadrants,  no masses,   ?Extremities: Extremities normal, atraumatic, no  edema  ?Pulses: 2+ and symmetric  ? ? ?Lab Results: ?Recent Labs  ?  04/14/21 ?5397 04/15/21 ?6734  ?NA 135 136  ?K 4.4 4.3  ?CL 100 104  ?CO2 29 26  ?GLUCOSE 94 104*  ?BUN 15 14  ?CREATININE 1.08 1.04  ?CALCIUM 8.7* 8.8*  ?MG 1.9 2.0  ?PHOS 4.7* 4.6  ? ?Recent Labs  ?  04/13/21 ?0825  ?AST 15  ?ALT 9  ?ALKPHOS 57  ?BILITOT 0.7  ?PROT 6.2*  ?ALBUMIN 3.2*  ? ?Recent Labs  ?  04/13/21 ?0825 04/13/21 ?1752 04/14/21 ?1937 04/14/21 ?1708 04/15/21 ?0433  ?WBC 9.7  --  9.0  --  8.8  ?NEUTROABS 5.7  --   --   --   --   ?HGB 7.4*   < > 7.9* 7.7* 7.7*  ?HCT 23.0*   < > 24.2* 23.5* 23.9*  ?MCV 89.8  --  88.3  --  88.5  ?PLT 329  --  315  --  320  ? < > = values in this interval not  displayed.  ? ?No results for input(s): LABPROT, INR in the last 72 hours. ? ? ? ?Assessment ?Upper GI bleed; large gastric ulcers ?- EGD 04/12/21 - LA Grade A reflux esophagitis with no bleeding. Partially obstructing non-bleeding gastric ulcers with pigmented material, suspicious for NSAID-induced etiology. Acute gastritis. Non-bleeding duodenal ulcer with pigmented material.  ? ?- Hgb improved to 7.9 (6.9) after 1 unit PRBCs. Now 7.7, stable. ?- No leukocytosis, BUN 14, Cr 1.04 ? ?Plan: ?Patient stable, currently on Protonix drip. Can change to IV Protonix BID today if patient remains stable.  ?Advance diet as tolerated.  ?Continue daily CBC and transfuse as needed to maintain HGB > 7  ?Continue supportive care. ?[facility] will follow.  ? ?Angelique Holm PA-C ?04/15/2021, 9:26 AM ? ?Contact #  [phone]  ?

## 2021-04-15 NOTE — Progress Notes (Signed)
?PROGRESS NOTE ? ? ? ?Jonathan Maynard  ZWC:[id number] DOB: 12-20-1970 DOA: 04/12/2021 ?PCP: Pcp, No  ?Chief Complaint  ?Patient presents with  ? Emesis  ? ? ?Brief Narrative:  ?51 yo with hx obesity presented with hematemesis and melena, now s/p EGD showing partially obstructing non bleeding gastric ulcers, non bleeding duodenal ulcer, and grade Mauriah Mcmillen esophagitis.  GI following.  ? ? ?Assessment & Plan: ?  ?Principal Problem: ?  Acute upper GI bleed ?Active Problems: ?  Acute blood loss anemia ?  Adrenal nodule ([place]) ?  Hepatic steatosis ?  Class 1 obesity ?  Hyponatremia ?  Diverticulosis ?  Prostate enlargement ?  Thrombocytosis ?  Gastritis ?  Multiple gastric ulcers ?  Esophagitis ? ? ?Assessment and Plan: ?* Acute upper GI bleed ?EGD with LA grade Fara Worthy reflux esophagitis, partially obstructing non bleeding gastric ulcers with pigmented material, concerning for NSAID induced etiology.  Perforation can't be ruled out.  Nonbleeding duodenal ulcer.  Acute gastritis, biopsied. ?Biopsy pending ?Appreciate GI recommendations -> BID PPI, ADAT -> PPI bid x 6 weeks, then daily indefinitely, no ASA/NSAIDS - bx pendnig ? ?Acute blood loss anemia ?stable ?S/ 2 units pRBC's during this admission (4/7 and 4/9) ?Transfuse for <7 or symptomatic  ? ?Adrenal nodule ([place]) ?Noted on CT abd/pelvis ?Recommended dedicated adrenal imaging to follow  ? ?Hyponatremia ?Mild, follow ? ?Hepatic steatosis ?Follow with PCP outpatient ? ?Prostate enlargement ?Noted, follow outpatient ? ?Thrombocytosis ?follow ? ? ?DVT prophylaxis: SCD ?Code Status: full ?Family Communication: none ?Disposition:  ? ?Status is: Inpatient ?Remains inpatient appropriate because: awaiting GI clearance ?  ?Consultants:  ?GI ? ?Procedures:  ?S/p EGD ? ?Antimicrobials:  ?Anti-infectives (From admission, onward)  ? ? None  ? ?  ? ? ?Subjective: ?No new complaints ? ?Objective: ?Vitals:  ? 04/14/21 1943 04/15/21 0500 04/15/21 0544 04/15/21 1437  ?BP: (!) 104/56  108/63  109/69  ?Pulse: 67  64 63  ?Resp: '18  18 20  '$ ?Temp: 98.7 ?F (37.1 ?C)  97.6 ?F (36.4 ?C) 98.5 ?F (36.9 ?C)  ?TempSrc: Oral  Oral Oral  ?SpO2: 100%  100% 100%  ?Weight:  90.5 kg    ?Height:      ? ? ?Intake/Output Summary (Last 24 hours) at 04/15/2021 1805 ?Last data filed at 04/15/2021 1325 ?Gross per 24 hour  ?Intake 1538 ml  ?Output --  ?Net 1538 ml  ? ?Filed Weights  ? 04/13/21 0135 04/14/21 0500 04/15/21 0500  ?Weight: 90.9 kg 90 kg 90.5 kg  ? ? ?Examination: ? ?General: No acute distress. ?Cardiovascular: RRR ?Lungs: unlabored ?Abdomen: Soft, nontender, nondistended ?Neurological: Alert and oriented ?3. Moves all extremities ?4 . Cranial nerves II through XII grossly intact. ?Skin: Warm and dry. No rashes or lesions. ?Extremities: No clubbing or cyanosis. No edema.  ? ?Data Reviewed: I have personally reviewed following labs and imaging studies ? ?CBC: ?Recent Labs  ?Lab 04/12/21 ?0435 04/12/21 ?1300 04/13/21 ?0825 04/13/21 ?1752 04/14/21 ?2353 04/14/21 ?1708 04/15/21 ?6144  ?WBC 10.6*  --  9.7  --  9.0  --  8.8  ?NEUTROABS  --   --  5.7  --   --   --   --   ?HGB 7.7*   < > 7.4* 6.9* 7.9* 7.7* 7.7*  ?HCT 24.3*   < > 23.0* 21.4* 24.2* 23.5* 23.9*  ?MCV 88.4  --  89.8  --  88.3  --  88.5  ?PLT 420*  --  329  --  315  --  320  ? < > = values in this interval not displayed.  ? ? ?Basic Metabolic Panel: ?Recent Labs  ?Lab 04/12/21 ?0435 04/13/21 ?0825 04/14/21 ?9678 04/15/21 ?9381  ?NA 133* 134* 135 136  ?K 4.7 4.7 4.4 4.3  ?CL 97* 101 100 104  ?CO2 '26 26 29 26  '$ ?GLUCOSE 117* 85 94 104*  ?BUN 27* '19 15 14  '$ ?CREATININE 1.01 1.16 1.08 1.04  ?CALCIUM 9.1 8.5* 8.7* 8.8*  ?MG  --   --  1.9 2.0  ?PHOS  --   --  4.7* 4.6  ? ? ?GFR: ?Estimated Creatinine Clearance: 87.9 mL/min (by C-G formula based on SCr of 1.04 mg/dL). ? ?Liver Function Tests: ?Recent Labs  ?Lab 04/12/21 ?0435 04/13/21 ?0825  ?AST 15 15  ?ALT 11 9  ?ALKPHOS 69 57  ?BILITOT 0.6 0.7  ?PROT 7.7 6.2*  ?ALBUMIN 4.1 3.2*  ? ? ?CBG: ?No results for input(s):  GLUCAP in the last 168 hours. ? ? ?Recent Results (from the past 240 hour(s))  ?MRSA Next Gen by PCR, Nasal     Status: None  ? Collection Time: 04/12/21  3:03 PM  ? Specimen: Nasal Mucosa; Nasal Swab  ?Result Value Ref Range Status  ? MRSA by PCR Next Gen NOT DETECTED NOT DETECTED Final  ?  Comment: (NOTE) ?The GeneXpert MRSA Assay (FDA approved for NASAL specimens only), ?is one component of Harmonee Tozer comprehensive MRSA colonization surveillance ?program. It is not intended to diagnose MRSA infection nor to guide ?or monitor treatment for MRSA infections. ?Test performance is not FDA approved in patients less than 2 years ?old. ?Performed at [facility], [address] ?[place], [place] [address] ?  ?  ? ? ? ? ? ?Radiology Studies: ?No results found. ? ? ? ? ? ?Scheduled Meds: ? sodium chloride   Intravenous Once  ? Chlorhexidine Gluconate Cloth  6 each Topical Daily  ? pantoprazole  40 mg Oral BID AC  ? ?Continuous Infusions: ? ? ? ? LOS: 3 days  ? ? ?Time spent: over 30 min ? ? ? ?Fayrene Helper, MD ?[facility] ? ? ?To contact the attending provider between 7A-7P or the covering provider during after hours 7P-7A, please log into the web site [email or web address] and access using universal [place] password for that web site. If you do not have the password, please call the hospital operator. ? ?04/15/2021, 6:05 PM  ? ? ?

## 2021-04-16 ENCOUNTER — Other Ambulatory Visit (HOSPITAL_COMMUNITY): Payer: Self-pay

## 2021-04-16 DIAGNOSIS — K922 Gastrointestinal hemorrhage, unspecified: Secondary | ICD-10-CM | POA: Diagnosis not present

## 2021-04-16 LAB — BASIC METABOLIC PANEL
Anion gap: 6 (ref 5–15)
BUN: 14 mg/dL (ref 6–20)
CO2: 26 mmol/L (ref 22–32)
Calcium: 8.9 mg/dL (ref 8.9–10.3)
Chloride: 103 mmol/L (ref 98–111)
Creatinine, Ser: 0.96 mg/dL (ref 0.61–1.24)
GFR, Estimated: >60 mL/min (ref >60)
Glucose, Bld: 115 mg/dL — ABNORMAL HIGH (ref 70–99)
Potassium: 4.4 mmol/L (ref 3.5–5.1)
Sodium: 135 mmol/L (ref 135–145)

## 2021-04-16 LAB — TYPE AND SCREEN
ABO/RH(D): O POS
Antibody Screen: NEGATIVE
Unit division: 0
Unit division: 0
Unit division: 0

## 2021-04-16 LAB — CBC
HCT: 25.5 % — ABNORMAL LOW (ref 39.0–52.0)
Hemoglobin: 8.3 g/dL — ABNORMAL LOW (ref 13.0–17.0)
MCH: 28.8 pg (ref 26.0–34.0)
MCHC: 32.5 g/dL (ref 30.0–36.0)
MCV: 88.5 fL (ref 80.0–100.0)
Platelets: 321 10*3/uL (ref 150–400)
RBC: 2.88 MIL/uL — ABNORMAL LOW (ref 4.22–5.81)
RDW: 14.8 % (ref 11.5–15.5)
WBC: 8.6 10*3/uL (ref 4.0–10.5)
nRBC: 0 % (ref 0.0–0.2)

## 2021-04-16 LAB — BPAM RBC
Blood Product Expiration Date: 202305042359
Blood Product Expiration Date: 202305042359
Blood Product Expiration Date: 202305122359
ISSUE DATE / TIME: 202304070930
ISSUE DATE / TIME: 202304082004
Unit Type and Rh: 5100
Unit Type and Rh: 5100
Unit Type and Rh: 5100

## 2021-04-16 LAB — MAGNESIUM: Magnesium: 2 mg/dL (ref 1.7–2.4)

## 2021-04-16 LAB — SURGICAL PATHOLOGY

## 2021-04-16 LAB — PHOSPHORUS: Phosphorus: 4 mg/dL (ref 2.5–4.6)

## 2021-04-16 MED ORDER — DIPHENHYDRAMINE HCL 50 MG/ML IJ SOLN: 25.0000 mg | Freq: Once | INTRAMUSCULAR | Status: DC | PRN

## 2021-04-16 MED ORDER — DOXAZOSIN MESYLATE 1 MG PO TABS
1.0000 mg | ORAL_TABLET | Freq: Every day | ORAL | 0 refills | Status: DC
  Filled 2021-04-16: qty 30, 30d supply, fill #0

## 2021-04-16 MED ORDER — FERROUS SULFATE 325 (65 FE) MG PO TABS
325.0000 mg | ORAL_TABLET | Freq: Every day | ORAL | 3 refills | Status: DC
  Filled 2021-04-16: qty 30, 30d supply, fill #0
  Filled 2021-05-22: qty 30, 30d supply, fill #1
  Filled 2021-06-23: qty 30, 30d supply, fill #2
  Filled 2021-07-08: qty 30, 30d supply, fill #3

## 2021-04-16 MED ORDER — NA FERRIC GLUC CPLX IN SUCROSE 12.5 MG/ML IV SOLN
250.0000 mg | Freq: Once | INTRAVENOUS | Status: AC
  Administered 2021-04-16: 250 mg via INTRAVENOUS
  Filled 2021-04-16: qty 20

## 2021-04-16 MED ORDER — PANTOPRAZOLE SODIUM 40 MG PO TBEC
DELAYED_RELEASE_TABLET | ORAL | 0 refills | Status: DC
  Filled 2021-04-16: qty 60, 30d supply, fill #0
  Filled 2021-05-11: qty 30, 30d supply, fill #1
  Filled 2021-05-30 – 2021-06-23 (×2): qty 30, 12d supply, fill #2

## 2021-04-16 NOTE — Progress Notes (Signed)
[facility] Progress Note ? ?Jonathan Maynard 51 y.o. Dec 27, 1970 ? ?CC:  Upper GI bleed ? ? ?Subjective: ?Patient resting comfortably in chair this morning.  Feeling well. Had a dark bowel movement this morning but denies abdominal pain, nausea, vomiting, heartburn, hematochezia.  Tolerating diet.  Has been up and walking the halls.  ? ?ROS : Review of Systems  ?Constitutional:  Negative for chills and fever.  ?Gastrointestinal:  Positive for melena. Negative for abdominal pain, blood in stool, constipation, diarrhea, heartburn, nausea and vomiting.  ?Genitourinary:  Negative for dysuria and urgency.   ? ? ?Objective: ?Vital signs in last 24 hours: ?Vitals:  ? 04/15/21 2049 04/16/21 0615  ?BP: 108/63 112/86  ?Pulse: 68 78  ?Resp: 20 20  ?Temp: 97.6 ?F (36.4 ?C) 97.9 ?F (36.6 ?C)  ?SpO2: 100% 100%  ? ? ?Physical Exam: ? ?General:  Alert, cooperative, no distress, appears stated age  ?Head:  Normocephalic, without obvious abnormality, atraumatic  ?Eyes:  Anicteric sclera, EOM's intact  ?Lungs:   Clear to auscultation bilaterally, respirations unlabored  ?Heart:  Regular rate and rhythm, S1, S2 normal  ?Abdomen:   Soft, non-tender, bowel sounds active all four quadrants,  no masses,   ?Extremities: Extremities normal, atraumatic, no  edema  ?Pulses: 2+ and symmetric  ? ? ?Lab Results: ?Recent Labs  ?  04/15/21 ?2563 04/16/21 ?0533  ?NA 136 135  ?K 4.3 4.4  ?CL 104 103  ?CO2 26 26  ?GLUCOSE 104* 115*  ?BUN 14 14  ?CREATININE 1.04 0.96  ?CALCIUM 8.8* 8.9  ?MG 2.0 2.0  ?PHOS 4.6 4.0  ? ?No results for input(s): AST, ALT, ALKPHOS, BILITOT, PROT, ALBUMIN in the last 72 hours. ?Recent Labs  ?  04/15/21 ?0433 04/16/21 ?0533  ?WBC 8.8 8.6  ?HGB 7.7* 8.3*  ?HCT 23.9* 25.5*  ?MCV 88.5 88.5  ?PLT 320 321  ? ?No results for input(s): LABPROT, INR in the last 72 hours. ? ? ? ?Assessment ?Upper GI bleed; large gastric ulcers ?- EGD 04/12/21 - LA Grade A reflux esophagitis with no bleeding. Partially obstructing  non-bleeding gastric ulcers with pigmented material, suspicious for NSAID-induced etiology. Acute gastritis. Non-bleeding duodenal ulcer with pigmented material.  ?  ?- Hgb 8.3, improved ?- No leukocytosis, BUN 14, Cr 0.96 ?- Biopsies pending ? ?Plan: ?Planning for discharge home today. ?Continue Protonix '40mg'$  BID X 6 weeks, then '40mg'$  QD indefinitely.  ?Avoid ASA/NSAIDs. ?[facility] will arrange follow up with Dr. Michail Sermon outpatient. ? ?Angelique Holm PA-C ?04/16/2021, 10:38 AM ? ?Contact #  [phone]  ?

## 2021-04-16 NOTE — Discharge Summary (Addendum)
Physician Discharge Summary  ?Jonathan Maynard WNU:[id number] DOB: [birth year] DOA: 04/12/2021 ? ?PCP: Pcp, No ? ?Admit date: 04/12/2021 ?Discharge date: 04/16/2021 ? ?Time spent: 40 minutes ? ?Recommendations for Outpatient Follow-up:  ?Follow outpatient CBC/CMP  ?Follow with gastroenterology outpatient  ?Needs to establish with PCP outpatient, has appt in May (requested he try to get earlier appt) ?Follow iron def anemia outpatient, on oral iron ?Follow L adrenal nodule outpatient, needs dedicated abdominal imaging ?Follow hepatic steatosis, prostatomegaly outpatient  ?Started on doxazosin for LUTS ? ?Discharge Diagnoses:  ?Principal Problem: ?  Acute upper GI bleed ?Active Problems: ?  Acute blood loss anemia ?  Adrenal nodule ([place]) ?  Hepatic steatosis ?  Class 1 obesity ?  Hyponatremia ?  Diverticulosis ?  Prostate enlargement ?  Thrombocytosis ?  Gastritis ?  Multiple gastric ulcers ?  Esophagitis ? ? ?Discharge Condition: stable ? ?Diet recommendation: heart healthy ? ?Filed Weights  ? 04/13/21 0135 04/14/21 0500 04/15/21 0500  ?Weight: 90.9 kg 90 kg 90.5 kg  ? ? ?History of present illness:  ?51 yo with hx obesity presented with hematemesis and melena, now s/p EGD showing partially obstructing non bleeding gastric ulcers, non bleeding duodenal ulcer, and grade Desteni Piscopo esophagitis.  GI following. ? ?Hospital Course:  ?Assessment and Plan: ?* Acute upper GI bleed ?EGD with LA grade Veryl Abril reflux esophagitis, partially obstructing non bleeding gastric ulcers with pigmented material, concerning for NSAID induced etiology.  Perforation can't be ruled out.  Nonbleeding duodenal ulcer.  Acute gastritis, biopsied. ?Biopsy pending ?Appreciate GI recommendations -> BID PPI, ADAT -> PPI bid x 6 weeks, then daily indefinitely, no ASA/NSAIDS - bx pending (follow outpatient) ? ?Acute blood loss anemia ?Stable on day of discharge ?Iron deficiency -> IV iron on day of discharge, will discharge with oral iron ?S/ 2 units pRBC's during  this admission (4/7 and 4/9) ?Transfuse for <7 or symptomatic  ? ?Adrenal nodule ([place]) ?Noted on CT abd/pelvis ?Recommended dedicated adrenal imaging to follow  ? ?Hyponatremia ?Mild, follow ? ?Hepatic steatosis ?Follow with PCP outpatient ? ?Prostate enlargement ?Noted, follow outpatient ? ?Thrombocytosis ?follow ? ? ?Procedures: ?EGD ?Impression ?the duodenum was otherwise normal. ?- LA Grade Rhonin Trott reflux esophagitis with no bleeding. ?- Z-line regular, 42 cm from the incisors. ?- Partially obstructing non-bleeding gastric ulcers with pigmented material. Suspicious for ?NSAID induced etiology. Perforation of the bowel wall cannot be ruled out. ?- Acute gastritis. Biopsied. ?- Non-bleeding duodenal ulcer with pigmented material.  ?Recommendation ?NPO. ?- Observe patient's clinical course. ?- Give Protonix (pantoprazole): 8 mg/hr IV by continuous infusion. ?- Await pathology ? ?Consultations: ?GI ? ?Discharge Exam: ?Vitals:  ? 04/15/21 2049 04/16/21 0615  ?BP: 108/63 112/86  ?Pulse: 68 78  ?Resp: 20 20  ?Temp: 97.6 ?F (36.4 ?C) 97.9 ?F (36.6 ?C)  ?SpO2: 100% 100%  ? ?No complaints ?Eager for discharge ? ?General: No acute distress. ?Cardiovascular: RRR ?Lungs: unlabored ?Abdomen: Soft, nontender, nondistended ?Neurological: Alert and oriented ?3. Moves all extremities ?4. Cranial nerves II through XII grossly intact. ?Skin: Warm and dry. No rashes or lesions. ?Extremities: No clubbing or cyanosis. No edema.  ? ?Discharge Instructions ? ? ?Discharge Instructions   ? ? Call MD for:  difficulty breathing, headache or visual disturbances   Complete by: As directed ?  ? Call MD for:  extreme fatigue   Complete by: As directed ?  ? Call MD for:  hives   Complete by: As directed ?  ? Call MD for:  persistant dizziness  or light-headedness   Complete by: As directed ?  ? Call MD for:  persistant nausea and vomiting   Complete by: As directed ?  ? Call MD for:  redness, tenderness, or signs of infection (pain, swelling, redness,  odor or Janowski/yellow discharge around incision site)   Complete by: As directed ?  ? Call MD for:  severe uncontrolled pain   Complete by: As directed ?  ? Call MD for:  temperature >100.4   Complete by: As directed ?  ? Diet - low sodium heart healthy   Complete by: As directed ?  ? Discharge instructions   Complete by: As directed ?  ? You were seen for vomiting blood and black/tarry stools.   ? ?You had an EGD (upper endoscopy) that showed esophagitis (inflammation of esophagus) and partially obstructing non bleeding gastric ulcers.  You also had acute gastritis (inflammation of the stomach).  You have pending biopsies of the stomach.  [facility] gastroenterology will follow up these results with you, if you don't hear anything, you should reach out to them. ? ?Take protonix twice Diondre Pulis day for 6 weeks, then transition to daily dosing indefinitely.  You should avoid any aspirin or NSAIDs or over the counter meds (discuss any over the counter meds with your doctor). ? ?You have severe iron deficiency.  We'll give you some iron through the IV prior to discharge.  We'll also send you with oral iron to continue.  Follow up labs with your PCP as an outpatient. ?  ?You should see your primary care doctor or gastroenterologist for repeat hemoglobin check within 1-2 weeks of discharge.  You should establish with your primary care doctor, see if they can move your appointment in May up with your need for post hospital follow up (see if they have any cancellations). ? ?You had an abnormal imaging finding on your CT scan from admission.  Aadhav Uhlig left adrenal nodule.  You should have dedicated imaging of your left adrenal gland as an outpatient.  You also had mild fatty liver that was seen, you can follow this findings up with your PCP and an enlarged prostate. ? ?We'll send you with cardura for your lower urinary tract symptoms (frequent urinary, difficulty starting, starting/stopping, weak stream, night time urination).  You should  follow up with your PCP regarding your symptoms and your enlarged prostate. ? ?Return for new, recurrent, or worsening symptoms. ? ?Please ask your PCP to request records from this hospitalization so they know what was done and what the next steps will be.  ? Increase activity slowly   Complete by: As directed ?  ? ?  ? ?Allergies as of 04/16/2021   ?No Known Allergies ?  ? ?  ?Medication List  ?  ? ?STOP taking these medications   ? ?ASPIRIN PO ?  ?ibuprofen 200 MG tablet ?Commonly known as: ADVIL ?  ?polyethylene glycol 17 g packet ?Commonly known as: MIRALAX / GLYCOLAX ?  ? ?  ? ?TAKE these medications   ? ?doxazosin 1 MG tablet ?Commonly known as: Cardura ?Take 1 tablet (1 mg total) by mouth at bedtime. For your lower urinary tract symptoms from enlarged prostate ?  ?FeroSul 325 (65 FE) MG tablet ?Generic drug: ferrous sulfate ?Take 1 tablet (325 mg total) by mouth daily. ?  ?pantoprazole 40 MG tablet ?Commonly known as: PROTONIX ?Take 1 tablet (40 mg total) by mouth 2 (two) times daily before Caia Lofaro meal for 42 days (6 weeks), THEN 1 tablet (  40 mg total) daily thereafter ?Start taking on: April 16, 2021 ?  ? ?  ? ?No Known Allergies ? Follow-up Information   ? ? Wilford Corner, MD Follow up.   ?Specialty: Gastroenterology ?Contact information: ?[address] ?[address] [place] [address] ?[phone] ? ? ?  ?  ? ?  ?  ? ?  ? ? ? ?The results of significant diagnostics from this hospitalization (including imaging, microbiology, ancillary and laboratory) are listed below for reference.   ? ?Significant Diagnostic Studies: ?CT ABDOMEN PELVIS W CONTRAST ? ?Result Date: 04/12/2021 ?CLINICAL DATA:  Acute abdominal pain. EXAM: CT ABDOMEN AND PELVIS WITH CONTRAST TECHNIQUE: Multidetector CT imaging of the abdomen and pelvis was performed using the standard protocol following bolus administration of intravenous contrast. RADIATION DOSE REDUCTION: This exam was performed according to the departmental dose-optimization  program which includes automated exposure control, adjustment of the mA and/or kV according to patient size and/or use of iterative reconstruction technique. CONTRAST:  130m OMNIPAQUE IOHEXOL 300 MG/ML

## 2021-04-18 NOTE — Anesthesia Postprocedure Evaluation (Signed)
Anesthesia Post Note ? ?Patient: Dawsen Krieger ? ?Procedure(s) Performed: ESOPHAGOGASTRODUODENOSCOPY (EGD) ?BIOPSY ? ?  ? ?Patient location during evaluation: Endoscopy ?Anesthesia Type: MAC ?Level of consciousness: awake ?Pain management: pain level controlled ?Vital Signs Assessment: post-procedure vital signs reviewed and stable ?Respiratory status: spontaneous breathing ?Cardiovascular status: stable ?Postop Assessment: no apparent nausea or vomiting ?Anesthetic complications: no ? ? ?No notable events documented. ? ?Last Vitals:  ?Vitals:  ? 04/15/21 2049 04/16/21 0615  ?BP: 108/63 112/86  ?Pulse: 68 78  ?Resp: 20 20  ?Temp: 36.4 ?C 36.6 ?C  ?SpO2: 100% 100%  ?  ?Last Pain:  ?Vitals:  ? 04/16/21 1000  ?TempSrc:   ?PainSc: 0-No pain  ? ? ?  ?  ?  ?  ?  ?  ? ?Huston Foley ? ? ? ? ?

## 2021-05-01 ENCOUNTER — Other Ambulatory Visit (HOSPITAL_COMMUNITY): Payer: Self-pay

## 2021-05-01 MED ORDER — AMOXICILLIN 500 MG PO CAPS
1000.0000 mg | ORAL_CAPSULE | Freq: Two times a day (BID) | ORAL | 0 refills | Status: AC
  Filled 2021-05-01: qty 56, 14d supply, fill #0

## 2021-05-01 MED ORDER — CLARITHROMYCIN 500 MG PO TABS
500.0000 mg | ORAL_TABLET | Freq: Two times a day (BID) | ORAL | 0 refills | Status: AC
  Filled 2021-05-01: qty 28, 14d supply, fill #0

## 2021-05-11 ENCOUNTER — Other Ambulatory Visit (HOSPITAL_COMMUNITY): Payer: Self-pay

## 2021-05-11 ENCOUNTER — Other Ambulatory Visit: Payer: Self-pay

## 2021-05-14 ENCOUNTER — Other Ambulatory Visit (HOSPITAL_COMMUNITY): Payer: Self-pay

## 2021-05-18 ENCOUNTER — Other Ambulatory Visit (HOSPITAL_COMMUNITY): Payer: Self-pay

## 2021-05-18 ENCOUNTER — Other Ambulatory Visit: Payer: Self-pay

## 2021-05-20 ENCOUNTER — Other Ambulatory Visit (HOSPITAL_COMMUNITY): Payer: Self-pay

## 2021-05-22 ENCOUNTER — Other Ambulatory Visit (HOSPITAL_COMMUNITY): Payer: Self-pay

## 2021-05-23 ENCOUNTER — Other Ambulatory Visit (HOSPITAL_COMMUNITY): Payer: Self-pay

## 2021-05-27 ENCOUNTER — Other Ambulatory Visit (HOSPITAL_COMMUNITY): Payer: Self-pay

## 2021-05-31 ENCOUNTER — Other Ambulatory Visit (HOSPITAL_COMMUNITY): Payer: Self-pay

## 2021-05-31 ENCOUNTER — Other Ambulatory Visit: Payer: Self-pay

## 2021-06-04 ENCOUNTER — Other Ambulatory Visit (HOSPITAL_COMMUNITY): Payer: Self-pay

## 2021-06-05 ENCOUNTER — Other Ambulatory Visit (HOSPITAL_COMMUNITY): Payer: Self-pay

## 2021-06-05 MED ORDER — PANTOPRAZOLE SODIUM 40 MG PO TBEC
40.0000 mg | DELAYED_RELEASE_TABLET | Freq: Two times a day (BID) | ORAL | 3 refills | Status: DC
  Filled 2021-06-05: qty 60, 30d supply, fill #0
  Filled 2021-07-08: qty 60, 30d supply, fill #1
  Filled 2021-08-19: qty 60, 30d supply, fill #2
  Filled 2021-09-22: qty 60, 30d supply, fill #3

## 2021-06-18 ENCOUNTER — Other Ambulatory Visit (HOSPITAL_COMMUNITY): Payer: Self-pay

## 2021-06-18 MED ORDER — VITAMIN D (ERGOCALCIFEROL) 50000 UNITS PO CAPS
1.0000 | ORAL_CAPSULE | ORAL | 5 refills | Status: DC
  Filled 2021-06-18 – 2021-07-08 (×2): qty 4, 28d supply, fill #0

## 2021-06-24 ENCOUNTER — Other Ambulatory Visit (HOSPITAL_COMMUNITY): Payer: Self-pay

## 2021-06-24 ENCOUNTER — Other Ambulatory Visit: Payer: Self-pay

## 2021-06-27 ENCOUNTER — Other Ambulatory Visit (HOSPITAL_COMMUNITY): Payer: Self-pay

## 2021-07-02 ENCOUNTER — Other Ambulatory Visit (HOSPITAL_COMMUNITY): Payer: Self-pay

## 2021-07-08 ENCOUNTER — Other Ambulatory Visit (HOSPITAL_COMMUNITY): Payer: Self-pay

## 2021-07-16 ENCOUNTER — Other Ambulatory Visit (HOSPITAL_COMMUNITY): Payer: Self-pay

## 2021-07-17 ENCOUNTER — Other Ambulatory Visit (HOSPITAL_COMMUNITY): Payer: Self-pay

## 2021-08-19 ENCOUNTER — Other Ambulatory Visit: Payer: Self-pay

## 2021-08-19 ENCOUNTER — Other Ambulatory Visit (HOSPITAL_COMMUNITY): Payer: Self-pay

## 2021-08-20 ENCOUNTER — Other Ambulatory Visit (HOSPITAL_COMMUNITY): Payer: Self-pay

## 2021-08-21 ENCOUNTER — Other Ambulatory Visit (HOSPITAL_COMMUNITY): Payer: Self-pay

## 2021-08-23 ENCOUNTER — Other Ambulatory Visit (HOSPITAL_COMMUNITY): Payer: Self-pay

## 2021-08-30 ENCOUNTER — Other Ambulatory Visit (HOSPITAL_COMMUNITY): Payer: Self-pay

## 2021-09-17 ENCOUNTER — Other Ambulatory Visit (HOSPITAL_COMMUNITY): Payer: Self-pay

## 2021-09-17 ENCOUNTER — Other Ambulatory Visit: Payer: Self-pay

## 2021-09-22 ENCOUNTER — Other Ambulatory Visit (HOSPITAL_COMMUNITY): Payer: Self-pay

## 2021-09-23 ENCOUNTER — Other Ambulatory Visit (HOSPITAL_COMMUNITY): Payer: Self-pay

## 2021-10-01 ENCOUNTER — Other Ambulatory Visit (HOSPITAL_COMMUNITY): Payer: Self-pay

## 2021-10-23 ENCOUNTER — Other Ambulatory Visit (HOSPITAL_COMMUNITY): Payer: Self-pay

## 2021-10-23 MED ORDER — PANTOPRAZOLE SODIUM 40 MG PO TBEC
40.0000 mg | DELAYED_RELEASE_TABLET | Freq: Two times a day (BID) | ORAL | 3 refills | Status: DC
  Filled 2021-10-23: qty 60, 30d supply, fill #0
  Filled 2021-12-09: qty 60, 30d supply, fill #1
  Filled 2022-01-03 – 2022-01-18 (×4): qty 60, 30d supply, fill #2
  Filled 2022-02-28 – 2022-03-21 (×2): qty 60, 30d supply, fill #3

## 2021-10-24 ENCOUNTER — Other Ambulatory Visit (HOSPITAL_COMMUNITY): Payer: Self-pay

## 2021-10-24 ENCOUNTER — Telehealth: Payer: Self-pay | Admitting: Physician Assistant

## 2021-10-24 NOTE — Telephone Encounter (Signed)
Scheduled appt per 10/19 referral. Pt is aware of appt date and time. Pt is aware to arrive 15 mins prior to appt time and to bring and updated insurance card. Pt is aware of appt location.   

## 2021-10-29 ENCOUNTER — Encounter: Payer: Self-pay | Admitting: Gastroenterology

## 2021-10-31 DIAGNOSIS — C169 Malignant neoplasm of stomach, unspecified: Secondary | ICD-10-CM | POA: Insufficient documentation

## 2021-10-31 NOTE — Progress Notes (Unsigned)
[place] [phone]   Fax:([phone]  CONSULT NOTE  REFERRING PHYSICIAN: Dr. Michail Sermon  REASON FOR CONSULTATION:  Gastric cancer  HPI Jonathan Maynard is a 51 y.o. male with a history of peptic ulcer disease, H. pylori, hepatic steatosis, diverticulosis, iron deficiency anemia, and GI bleed is referred to the clinic for newly diagnosed gastric cancer.  In April 2023, the patient presented to the emergency room with a chief complaint of abdominal pain and emesis.  On exam, the patient had an melancholic stool.  On further questions, the patient admitted to using multiple NSAIDs including aspirin, Advil, and ibuprofen due to his pain.  He also endorsed dark-colored emesis.  Lab work showed anemia with a hemoglobin of 7.7.  He was admitted for GI bleeding.  He had a CT of the abdomen pelvis on 04/12/21 which showed irregular wall thickening in the gastric antrum.  He also has mild hepatic steatosis.   He had an EGD performed by Dr. Michail Sermon which revealed reflux esophagitis, partially obstructing non bleeding gastric ulcers with pigmented material, in the prepyloric region of the stomach. There was segmental severe inflammation characterized by congestion, erythema, and serpentine ulcerations in the gastric antrum. concerning for NSAID induced etiology.  Biopsies were performed which also showed mildly active chronic H. pylori gastritis.  The IHC for H. pylori was positive.  The patient followed up with GI outpatient on 07/24/21 due to the history of the GI bleed.  Dr. Michail Sermon arrange for the patient to have EGD and colonoscopy in October 2023 to ensure that the ulcers were healing.   His repeat EGD performed on 10/18/21 showed a large ulcerated partially circumferential mass without bleeding and stigmata of recent bleeding in the gastric antrum.   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  The patient was referred to our clinic as well as general surgery.  The patient is scheduled to see Dr. Kieth Brightly next week on 11/05/2021.  Today, the patient states he feels "good".  He had a lot of weight loss back in April 2023 when he was having acute gastritis but his appetite has improved since that time and his weight has been increasing.  He reports his appetite picked up since April 2023.  The patient denies any abdominal pain at this time, melancholic stools, fevers, chills, night sweats, chest pain, shortness of breath, cough, hemoptysis, jaundice, or itching.  She denies any back pain.  Denies any hematemesis, odynophagia, or dysphagia.  The patient did mention that he stopped taking his ferrous sulfate approximately 2 weeks ago due to correlating this with worsening lower extremity swelling.  Of note, the patient has not seen his PCP in a while.  The patient states he was established on [employer] with a Dr. Domenica Fail.   The patient denies any family history of any malignancy.  The patient's mother has diabetes.  The patient's father passed away but he is not sure what medical problems he had.  The patient's siblings are healthy except his sister is autistic.  The patient is accompanied by his wife today.  The patient works as an Clinical biochemist.  He is independent with his activities of daily living.  The patient has smoked for approximately 36 years averaging occipitally 5 cigarettes/day for most of his life.  He states he is trying to cut back and is currently smoking 1 to 2  cigarettes/day.  Patient denies any significant alcohol use.  The patient denies any history of drug use.  Patient is originally from [place] and most of his family lives in [place].    HPI  Past Medical History:  Diagnosis Date   Acute upper GI bleed 04/12/2021   Class 1 obesity 04/12/2021   Diverticulosis 04/12/2021   Hepatic  steatosis 04/12/2021    Past Surgical History:  Procedure Laterality Date   BIOPSY  04/12/2021   Procedure: BIOPSY;  Surgeon: Wilford Corner, MD;  Location: [facility];  Service: Gastroenterology;;   ESOPHAGOGASTRODUODENOSCOPY N/A 04/12/2021   Procedure: ESOPHAGOGASTRODUODENOSCOPY (EGD);  Surgeon: Wilford Corner, MD;  Location: [facility];  Service: Gastroenterology;  Laterality: N/A;   HERNIA REPAIR      Family History  Problem Relation Age of Onset   Diabetes Mother     Social History Social History   Tobacco Use   Smoking status: Former    Packs/day: 0.50    Years: 35.00    Total pack years: 17.50    Types: Cigarettes   Smokeless tobacco: Never  Substance Use Topics   Alcohol use: Not Currently   Drug use: Never    No Known Allergies  Current Outpatient Medications  Medication Sig Dispense Refill   ferrous sulfate 325 (65 FE) MG tablet Take 1 tablet (325 mg total) by mouth daily. 30 tablet 3   pantoprazole (PROTONIX) 40 MG tablet Take 1 tablet (40 mg total) by mouth 2 (two) times daily. 60 tablet 3   No current facility-administered medications for this visit.    REVIEW OF SYSTEMS:   Review of Systems  Constitutional: Negative for appetite change, chills, fatigue, fever and unexpected weight change.  HENT: Negative for mouth sores, nosebleeds, sore throat and trouble swallowing.   Eyes: Negative for eye problems and icterus.  Respiratory: Negative for cough, hemoptysis, shortness of breath and wheezing.   Cardiovascular: Intermittent mild bilateral lower extremity swelling. Negative for chest pain.  Gastrointestinal: Negative for abdominal pain, constipation, diarrhea, nausea and vomiting.  Genitourinary: Negative for bladder incontinence, difficulty urinating, dysuria, frequency and hematuria.   Musculoskeletal: Negative for back pain, gait problem, neck pain and neck stiffness.  Skin: Negative for itching and rash.  Neurological: Negative for dizziness,  extremity weakness, gait problem, headaches, light-headedness and seizures.  Hematological: Negative for adenopathy. Does not bruise/bleed easily.  Psychiatric/Behavioral: Negative for confusion, depression and sleep disturbance. The patient is not nervous/anxious.     PHYSICAL EXAMINATION:  Blood pressure 111/65, pulse 86, temperature 97.9 F (36.6 C), temperature source Temporal, resp. rate 18, height 5' 5"  (1.651 m), weight 182 lb 9.6 oz (82.8 kg), SpO2 98 %.  ECOG PERFORMANCE STATUS: 0  Physical Exam  Constitutional: Oriented to person, place, and time and well-developed, well-nourished, and in no distress.  HENT:  Head: Normocephalic and atraumatic.  Mouth/Throat: Oropharynx is clear and moist. No oropharyngeal exudate.  Eyes: Conjunctivae are normal. Right eye exhibits no discharge. Left eye exhibits no discharge. No scleral icterus.  Neck: Normal range of motion. Neck supple.  Cardiovascular: Normal rate, regular rhythm, normal heart sounds and intact distal pulses.   Pulmonary/Chest: Effort normal and breath sounds normal. No respiratory distress. No wheezes. No rales.  Abdominal: Soft. Bowel sounds are normal. Exhibits no distension and no mass. There is no tenderness.  Musculoskeletal: Normal range of motion. Mild bilateral lower extremity swelling. Non-pitting.  Lymphadenopathy:    No cervical adenopathy.  Neurological: Alert and oriented to person, place, and time.  Exhibits normal muscle tone. Gait normal. Coordination normal.  Skin: Skin is warm and dry. No rash noted. Not diaphoretic. No erythema. No pallor.  Psychiatric: Mood, memory and judgment normal.  Vitals reviewed.  LABORATORY DATA: Lab Results  Component Value Date   WBC 8.6 04/16/2021   HGB 8.3 (L) 04/16/2021   HCT 25.5 (L) 04/16/2021   MCV 88.5 04/16/2021   PLT 321 04/16/2021      Chemistry      Component Value Date/Time   NA 135 04/16/2021 0533   K 4.4 04/16/2021 0533   CL 103 04/16/2021 0533    CO2 26 04/16/2021 0533   BUN 14 04/16/2021 0533   CREATININE 0.96 04/16/2021 0533      Component Value Date/Time   CALCIUM 8.9 04/16/2021 0533   ALKPHOS 57 04/13/2021 0825   AST 15 04/13/2021 0825   ALT 9 04/13/2021 0825   BILITOT 0.7 04/13/2021 0825       RADIOGRAPHIC STUDIES: No results found.  ASSESSMENT: This very pleasant 51 year old African-American male with:   Gastric Cancer, [id number] -In April 2023 he was hospitalized for GI bleed, IDA, gastric ulcers, and h. Pylori. He had a CT at that time which noted abnormal irregular wall thickening of the gastric antrum -To follow up to ensure healing of the gastric ulcers, he had repeat EGD by Dr. Michail Sermon on 10/18/2021 which revealed large ulcerated partially circumferential mass in the gastric antrum.  -The final pathology was consistent with at least mucosal intramucosal adenocarcinoma arising within chronic inactive gastritis with intestinal metaplasia. -The patient was seen by Dr. Burr Medico today.  Dr. Burr Medico had a lengthy discussion with the patient today about his current condition and recommended further work-up. -The patient will need to complete the staging work-up with a CT CAP and EUS -We will reach out to Dr. Paulita Fujita to see if he can arrange for EUS.  I have ordered CT chest abdomen and pelvis. -The patient is scheduled to see a surgeon Dr. Kieth Brightly on 11/05/21 -Dr. Burr Medico discussed that this is early stage disease that the patient may proceed directly with surgery.  Depending on the final pathology the patient may require adjuvant treatment later on -If he has T2 or high, or positive lymph nodes on EUS, Dr. Burr Medico would neoadjuvant chemotherapy prior to surgery -Will check MMR on his biopsy -We will discuss the patient's case at the multidisciplinary GI conference next week -F/U once staging work up completed.   2. Anemia Hx GI bleeding in April 2023 -Had hospitization in April 2023 for Gi bleed secondary to gastric ulcers  secondary to NSAID use and H. Pylori -He was taking an iron supplement up until approximately 2 weeks ago.  He thinks that the iron supplement corresponds with worsening lower extremity swelling. -No recent labs, will arrange baseline labs CBC, CMP, CEA iron, and ferritin today -Encouraged the patient to try a different brand of an iron supplement such as slow release iron. I don't necessarily think his swelling is related to his iron, which I shared with him. He has not seen his PCP recently. I would recommend he follow up for other causes of intermittent lower extremity swelling.  -CBC today shows anemia Hbg 8.5 , iron studies pending. If significantly low iron, I will call the patient and discussed the option of iron infusions if needed. I told him I would call him Monday with the iron results. If iron studies near normal, I will consider adding on B12 and folate to next lab  draw.   3. Genetics -No family history of cancer -Will let the patient know in the future if genetic counseling is required based on his   67. Tobacco use -The patient has smoked for 36 years averaging 5 cigarettes per day -He is currently smoking 1-2 per day -We discussed the importance of smoking cessation. He is aware and is going to try to quit.    Plan: -Request MMR -Reach out to Dr. Dolores Hoose for consideration of EUS -See surgeon next week on 11/05/21 -Order CT CAP -CBC, CEA, iron, ferritin, and CMP drawn today -Follow up after workup completed  -Instructed to resume iron, will consider IV iron if needed based on labs. Will call him Monday with results.     The patient voices understanding of current disease status and treatment options and is in agreement with the current care plan.  All questions were answered. The patient knows to call the clinic with any problems, questions or concerns. We can certainly see the patient much sooner if necessary.  Thank you so much for allowing me to participate in  the care of Jonathan Maynard. I will continue to follow up the patient with you and assist in his care.   Disclaimer: This note was dictated with voice recognition software. Similar sounding words can inadvertently be transcribed and may not be corrected upon review.   Zakiya Sporrer L Naythan Douthit November 01, 2021, 3:03 PM  Addendum I have seen the patient, examined him. I agree with the assessment and and plan and have edited the notes.   51 yo male with no significant past medical history, presented with GI bleeding from gastric ulcer and iron deficient anemia in April 2023, gastric biopsy was negative for malignancy at that time.  On repeated EGD recently, it unfortunately showed gastric adenocarcinoma in the atrium.  We will repeat his staging CT, and send him back to Dr. Michail Sermon for EUS staging.  I briefly discussed the role of neoadjuvant chemotherapy in locally advanced gastric cancer. He is a candidate for surgical resection if CT scan negative for distant recurrence, will refer him to surgeon Dr. Zenia Resides or Dr. Barry Dienes..  I plan to see him back after CT scan and EUS, to finalize his treatment plan.  Truitt Merle  11/01/2021

## 2021-11-01 ENCOUNTER — Inpatient Hospital Stay: Payer: BC Managed Care – PPO | Attending: Physician Assistant

## 2021-11-01 ENCOUNTER — Other Ambulatory Visit: Payer: Self-pay

## 2021-11-01 ENCOUNTER — Inpatient Hospital Stay (HOSPITAL_BASED_OUTPATIENT_CLINIC_OR_DEPARTMENT_OTHER): Payer: BC Managed Care – PPO | Admitting: Physician Assistant

## 2021-11-01 DIAGNOSIS — Z8619 Personal history of other infectious and parasitic diseases: Secondary | ICD-10-CM | POA: Insufficient documentation

## 2021-11-01 DIAGNOSIS — M7989 Other specified soft tissue disorders: Secondary | ICD-10-CM | POA: Diagnosis not present

## 2021-11-01 DIAGNOSIS — Z8711 Personal history of peptic ulcer disease: Secondary | ICD-10-CM | POA: Diagnosis not present

## 2021-11-01 DIAGNOSIS — F17210 Nicotine dependence, cigarettes, uncomplicated: Secondary | ICD-10-CM

## 2021-11-01 DIAGNOSIS — D500 Iron deficiency anemia secondary to blood loss (chronic): Secondary | ICD-10-CM | POA: Diagnosis not present

## 2021-11-01 DIAGNOSIS — C163 Malignant neoplasm of pyloric antrum: Secondary | ICD-10-CM

## 2021-11-01 DIAGNOSIS — R970 Elevated carcinoembryonic antigen [CEA]: Secondary | ICD-10-CM | POA: Diagnosis not present

## 2021-11-01 LAB — CBC WITH DIFFERENTIAL (CANCER CENTER ONLY)
Abs Immature Granulocytes: 0.02 10*3/uL (ref 0.00–0.07)
Basophils Absolute: 0.1 10*3/uL (ref 0.0–0.1)
Basophils Relative: 1 %
Eosinophils Absolute: 0.3 10*3/uL (ref 0.0–0.5)
Eosinophils Relative: 5 %
HCT: 25.7 % — ABNORMAL LOW (ref 39.0–52.0)
Hemoglobin: 8.5 g/dL — ABNORMAL LOW (ref 13.0–17.0)
Immature Granulocytes: 0 %
Lymphocytes Relative: 38 %
Lymphs Abs: 2.5 10*3/uL (ref 0.7–4.0)
MCH: 29.5 pg (ref 26.0–34.0)
MCHC: 33.1 g/dL (ref 30.0–36.0)
MCV: 89.2 fL (ref 80.0–100.0)
Monocytes Absolute: 0.8 10*3/uL (ref 0.1–1.0)
Monocytes Relative: 13 %
Neutro Abs: 2.8 10*3/uL (ref 1.7–7.7)
Neutrophils Relative %: 43 %
Platelet Count: 230 10*3/uL (ref 150–400)
RBC: 2.88 MIL/uL — ABNORMAL LOW (ref 4.22–5.81)
RDW: 13.9 % (ref 11.5–15.5)
WBC Count: 6.4 10*3/uL (ref 4.0–10.5)
nRBC: 0 % (ref 0.0–0.2)

## 2021-11-01 LAB — CMP (CANCER CENTER ONLY)
ALT: 8 U/L (ref 0–44)
AST: 13 U/L — ABNORMAL LOW (ref 15–41)
Albumin: 3.4 g/dL — ABNORMAL LOW (ref 3.5–5.0)
Alkaline Phosphatase: 77 U/L (ref 38–126)
Anion gap: 2 — ABNORMAL LOW (ref 5–15)
BUN: 8 mg/dL (ref 6–20)
CO2: 29 mmol/L (ref 22–32)
Calcium: 8.7 mg/dL — ABNORMAL LOW (ref 8.9–10.3)
Chloride: 110 mmol/L (ref 98–111)
Creatinine: 1.03 mg/dL (ref 0.61–1.24)
GFR, Estimated: >60 mL/min (ref >60)
Glucose, Bld: 105 mg/dL — ABNORMAL HIGH (ref 70–99)
Potassium: 4.2 mmol/L (ref 3.5–5.1)
Sodium: 141 mmol/L (ref 135–145)
Total Bilirubin: 0.3 mg/dL (ref 0.3–1.2)
Total Protein: 6.9 g/dL (ref 6.5–8.1)

## 2021-11-01 LAB — IRON AND IRON BINDING CAPACITY (CC-WL,HP ONLY)
Iron: 25 ug/dL — ABNORMAL LOW (ref 45–182)
Saturation Ratios: 7 % — ABNORMAL LOW (ref 17.9–39.5)
TIBC: 357 ug/dL (ref 250–450)
UIBC: 332 ug/dL (ref 117–376)

## 2021-11-01 LAB — FERRITIN: Ferritin: 6 ng/mL — ABNORMAL LOW (ref 24–336)

## 2021-11-01 NOTE — Progress Notes (Signed)
I faxed request for MMR testing on Mr Busta stomach biopsy tissue.

## 2021-11-01 NOTE — Patient Instructions (Addendum)
-  It was very nice meeting you all today.  I know we covered a lot of important information.  Here is the summary of the main take away points. -Dr. Michail Sermon did the upper endoscopy to loif ok at if your stomach ulcers healed, he saw a mass in that area.  He took a sample (biopsy) of the mass and a pathologist looks at it under a microscope.  When they looked at under microscope, it was consistent with stomach cancer/gastric cancer -There is a lot of work that we still need to do in order to determine what the best treatment is for you.  Need to perform the "staging" work-up.  Staging work-up includes seeing how deep into the lining of the stomach the tumor goes as well as checking other organs to make sure it has not spread and checking nearby lymph nodes. -We we do this is by ordering a CT scan of the chest, abdomen, and pelvis.  -The second thing is to reach out to Dr. Kathline Magic partner, Dr. Paulita Fujita, to see if he can do a procedure called EUS.  This is basically just like the upper endoscopy except they use a ultrasound machine to look at how deep the tumor goes into the stomach wall.  Can also look at nearby lymph nodes. -Regarding general principles of treatment, if it looks like this is early stage and if there are no lymph nodes involved and if the tumor did not go very deep and the lining of the stomach, some patients will go directly to surgery first.  And then after surgery we determine if you need any additional chemotherapy or not. -If it looks likes the tumor goes deep into the stomach wall and there are signs of lymph node involvement, some patients may require chemotherapy before proceeding with surgery. -We will see you back once we have completed the staging work-up for more detailed discussion about the plan.  Either way, Dr. Burr Medico will serve as your primary oncologist who will be the one that monitors you closely from a cancer standpoint in the future. -We will run some special test on the  sample that Dr. Michail Sermon took from your stomach.  We will let you know in the future if we would recommend having you see one of our genetic counselors to assess for any hereditary predisposition to cancer. -Please keep the appointment with the surgeons office next week as well. -I will arrange for your scans to be done at the [facility] to repeat some blood work today including a basic blood count, iron studies, kidney liver and electrolyte labs, and something we call a tumor marker.  I will call you about the results.

## 2021-11-01 NOTE — Progress Notes (Unsigned)
I met with Jonathan Maynard and his wife after  his consultation with Cassie heilingoetter, PA-C and  Dr Burr Medico.  I explained my role as a nurse navigator and provided my contact information.  I told them I will help facilitate scheduling of EUS and CT scans.  I will schedule a f/u appt with Dr Burr Medico when these tests have been scheduled.  All questions were answered. They verbalized understanding.

## 2021-11-04 ENCOUNTER — Encounter: Payer: Self-pay | Admitting: Physician Assistant

## 2021-11-04 ENCOUNTER — Ambulatory Visit: Payer: BC Managed Care – PPO | Admitting: Family Medicine

## 2021-11-04 ENCOUNTER — Other Ambulatory Visit: Payer: Self-pay | Admitting: Physician Assistant

## 2021-11-04 ENCOUNTER — Other Ambulatory Visit: Payer: Self-pay | Admitting: Gastroenterology

## 2021-11-04 DIAGNOSIS — D500 Iron deficiency anemia secondary to blood loss (chronic): Secondary | ICD-10-CM | POA: Insufficient documentation

## 2021-11-04 LAB — CEA (ACCESS): CEA (CHCC): 1989.58 ng/mL — ABNORMAL HIGH (ref 0.00–5.00)

## 2021-11-04 NOTE — Progress Notes (Signed)
Consult note Faxed to Dr Michail Sermon at [phone].

## 2021-11-05 ENCOUNTER — Encounter (HOSPITAL_COMMUNITY): Payer: Self-pay | Admitting: Gastroenterology

## 2021-11-05 NOTE — Anesthesia Preprocedure Evaluation (Signed)
Anesthesia Evaluation  Patient identified by MRN, date of birth, ID band Patient awake    Reviewed: Allergy & Precautions, H&P , NPO status , Patient's Chart, lab work & pertinent test results  Airway Mallampati: II  TM Distance: >3 FB Neck ROM: Full    Dental no notable dental hx. (+) Teeth Intact, Dental Advisory Given   Pulmonary neg pulmonary ROS, former smoker,    Pulmonary exam normal breath sounds clear to auscultation       Cardiovascular Exercise Tolerance: Good negative cardio ROS   Rhythm:Regular Rate:Normal     Neuro/Psych negative neurological ROS  negative psych ROS   GI/Hepatic Neg liver ROS, PUD,   Endo/Other  negative endocrine ROS  Renal/GU negative Renal ROS  negative genitourinary   Musculoskeletal   Abdominal   Peds  Hematology  (+) Blood dyscrasia, anemia ,   Anesthesia Other Findings   Reproductive/Obstetrics negative OB ROS                            Anesthesia Physical Anesthesia Plan  ASA: 2  Anesthesia Plan: MAC   Post-op Pain Management: Minimal or no pain anticipated   Induction: Intravenous  PONV Risk Score and Plan: 1 and Propofol infusion  Airway Management Planned: Natural Airway and Simple Face Mask  Additional Equipment:   Intra-op Plan:   Post-operative Plan:   Informed Consent: I have reviewed the patients History and Physical, chart, labs and discussed the procedure including the risks, benefits and alternatives for the proposed anesthesia with the patient or authorized representative who has indicated his/her understanding and acceptance.     Dental advisory given  Plan Discussed with: CRNA  Anesthesia Plan Comments:        Anesthesia Quick Evaluation

## 2021-11-06 ENCOUNTER — Ambulatory Visit (HOSPITAL_COMMUNITY): Payer: BC Managed Care – PPO | Admitting: Anesthesiology

## 2021-11-06 ENCOUNTER — Other Ambulatory Visit: Payer: Self-pay

## 2021-11-06 ENCOUNTER — Other Ambulatory Visit: Payer: Self-pay | Admitting: Hematology

## 2021-11-06 ENCOUNTER — Ambulatory Visit (HOSPITAL_COMMUNITY)
Admission: RE | Admit: 2021-11-06 | Discharge: 2021-11-06 | Disposition: A | Discharge status: Home / Self Care | Payer: BC Managed Care – PPO | Source: Ambulatory Visit | Attending: Gastroenterology | Admitting: Gastroenterology

## 2021-11-06 ENCOUNTER — Encounter (HOSPITAL_COMMUNITY)
Admission: RE | Disposition: A | Discharge status: Home / Self Care | Payer: Self-pay | Source: Ambulatory Visit | Attending: Gastroenterology

## 2021-11-06 DIAGNOSIS — C169 Malignant neoplasm of stomach, unspecified: Secondary | ICD-10-CM | POA: Diagnosis present

## 2021-11-06 DIAGNOSIS — Z87891 Personal history of nicotine dependence: Secondary | ICD-10-CM | POA: Insufficient documentation

## 2021-11-06 DIAGNOSIS — K3189 Other diseases of stomach and duodenum: Secondary | ICD-10-CM | POA: Insufficient documentation

## 2021-11-06 DIAGNOSIS — K259 Gastric ulcer, unspecified as acute or chronic, without hemorrhage or perforation: Secondary | ICD-10-CM | POA: Insufficient documentation

## 2021-11-06 DIAGNOSIS — I899 Noninfective disorder of lymphatic vessels and lymph nodes, unspecified: Secondary | ICD-10-CM | POA: Insufficient documentation

## 2021-11-06 DIAGNOSIS — D649 Anemia, unspecified: Secondary | ICD-10-CM | POA: Diagnosis not present

## 2021-11-06 DIAGNOSIS — D759 Disease of blood and blood-forming organs, unspecified: Secondary | ICD-10-CM | POA: Diagnosis not present

## 2021-11-06 HISTORY — PX: ESOPHAGOGASTRODUODENOSCOPY: SHX5428

## 2021-11-06 HISTORY — PX: UPPER ESOPHAGEAL ENDOSCOPIC ULTRASOUND (EUS): SHX6562

## 2021-11-06 SURGERY — UPPER ESOPHAGEAL ENDOSCOPIC ULTRASOUND (EUS)
Anesthesia: Monitor Anesthesia Care

## 2021-11-06 MED ORDER — LACTATED RINGERS IV SOLN
INTRAVENOUS | Status: AC | PRN
  Administered 2021-11-06: 1000 mL via INTRAVENOUS

## 2021-11-06 MED ORDER — PROPOFOL 500 MG/50ML IV EMUL
INTRAVENOUS | Status: AC
  Filled 2021-11-06: qty 250

## 2021-11-06 MED ORDER — PROPOFOL 500 MG/50ML IV EMUL
INTRAVENOUS | Status: DC | PRN
  Administered 2021-11-06: 60 mg via INTRAVENOUS
  Administered 2021-11-06: 100 ug/kg/min via INTRAVENOUS

## 2021-11-06 NOTE — Op Note (Signed)
[facility] Patient Name: Jonathan Maynard Procedure Date: 11/06/2021 MRN: [id number] Attending MD: Arta Silence , MD, [id number] Date of Birth: [birth year] CSN: [id number] Age: 51 Admit Type: Outpatient Procedure:                Upper EUS Indications:              Gastric mucosal mass on endoscopy (adenocarcinoma),                            Pre-treatment staging of gastric adenocarcinoma Providers:                Arta Silence, MD, Jaci Carrel, RN, [place], Technician, Brien Mates, RNFA Referring MD:             Dr. Wilford Corner; Dr. Truitt Merle Medicines:                Monitored Anesthesia Care Complications:            No immediate complications. Estimated Blood Loss:     Estimated blood loss: none. Procedure:                Pre-Anesthesia Assessment:                           - Prior to the procedure, a History and Physical                            was performed, and patient medications and                            allergies were reviewed. The patient's tolerance of                            previous anesthesia was also reviewed. The risks                            and benefits of the procedure and the sedation                            options and risks were discussed with the patient.                            All questions were answered, and informed consent                            was obtained. Prior Anticoagulants: The patient has                            taken no anticoagulant or antiplatelet agents. ASA                            Grade Assessment: II - A patient with mild systemic  disease. After reviewing the risks and benefits,                            the patient was deemed in satisfactory condition to                            undergo the procedure.                           After obtaining informed consent, the endoscope was                             passed under direct vision. Throughout the                            procedure, the patient's blood pressure, pulse, and                            oxygen saturations were monitored continuously. The                            GF-[id number]-[id number] ([id number] Olympus radial ultrasound                            scope was introduced through the mouth, and                            advanced to the second part of duodenum. The                            [id number]-[id number] ([id number]) Olympus endoscope was introduced                            through the mouth, and advanced to the antrum of                            the stomach. The upper EUS was accomplished without                            difficulty. The patient tolerated the procedure                            well. Scope In: Scope Out: Findings:      ENDOSCOPIC FINDING: :      The examined esophagus was normal.      A small amount of food (residue) was found in the gastric fundus and in       the gastric body.      Localized severe mucosal changes characterized by congestion, friability       (with contact bleeding) and ulceration were found in the prepyloric       region of the stomach.      The examined duodenum was normal.      ENDOSONOGRAPHIC FINDING: :      There was no sign of significant endosonographic abnormality in the left  lobe of the liver.      Many abnormal lymph nodes were visualized in the gastrohepatic ligament       (level 18), celiac region (level 20), perigastric region, peripancreatic       region and aortocaval region. The nodes were round, hypoechoic and had       well defined margins.      Localized wall thickening was visualized endosonographically in the       prepyloric region of the stomach. This appeared to primarily be due to       thickening within the deep mucosa (Layer 2) with spread at least       penetrating through the muscularis propria. Impression:               - Normal esophagus.                            - A small amount of food (residue) in the stomach.                           - Congested, friable (with contact bleeding) and                            ulcerated mucosa in the prepyloric region of the                            stomach.                           - Normal examined duodenum.                           - There was no evidence of significant pathology in                            the left lobe of the liver.                           - Many abnormal lymph nodes (over 10) were                            visualized in the gastrohepatic ligament (level                            18), celiac region (level 20), perigastric region,                            peripancreatic region and aortocaval region.                           - Wall thickening was seen in the prepyloric region                            of the stomach. The thickening appeared to be                            primarily within  the deep mucosa (Layer 2) but                            extended through the muscularis propria.                           - Overall constellation of findings consistent with                            at least T3 N3 Mx (at least stage III) gastric                            adenocarcinoma. Moderate Sedation:      None Recommendation:           - Discharge patient to home (via wheelchair).                           - Soft diet today.                           - Return to referring physician as previously                            scheduled. Procedure Code(s):        --- Professional ---                           [phone], Esophagogastroduodenoscopy, flexible,                            transoral; with endoscopic ultrasound examination                            limited to the esophagus, stomach or duodenum, and                            adjacent structures Diagnosis Code(s):        --- Professional ---                           K31.89, Other diseases of stomach and duodenum                            K92.2, Gastrointestinal hemorrhage, unspecified                           K25.9, Gastric ulcer, unspecified as acute or                            chronic, without hemorrhage or perforation                           I89.9, Noninfective disorder of lymphatic vessels                            and lymph nodes, unspecified  C16.9, Malignant neoplasm of stomach, unspecified CPT copyright 2022 American Medical Association. All rights reserved. The codes documented in this report are preliminary and upon coder review may  be revised to meet current compliance requirements. Arta Silence, MD 11/06/2021 9:16:56 AM This report has been signed electronically. Number of Addenda: 0

## 2021-11-06 NOTE — Progress Notes (Signed)
START ON PATHWAY REGIMEN - Gastroesophageal     A cycle is every 14 days:     Docetaxel      Oxaliplatin      Leucovorin      Fluorouracil   **Always confirm dose/schedule in your pharmacy ordering system**  Patient Characteristics: Gastric, Adenocarcinoma, Preoperative or Nonsurgical Candidate, M0 (Clinical Staging), cT3 or Higher or cN+, Surgical Candidate (Up to cT4a), MSS/pMMR or MSI Unknown Therapeutic Status: Preoperative or Nonsurgical Candidate, M0 (Clinical Staging) Histology: Adenocarcinoma Disease Classification: Gastric AJCC N Category: cN3 AJCC M Category: cM0 AJCC 8 Stage Grouping: III AJCC T Category: cT3 Microsatellite/Mismatch Repair Status: MSS/pMMR Intent of Therapy: Curative Intent, Discussed with Patient

## 2021-11-06 NOTE — Transfer of Care (Signed)
Immediate Anesthesia Transfer of Care Note  Patient: Jonathan Maynard  Procedure(s) Performed: Procedure(s): UPPER ESOPHAGEAL ENDOSCOPIC ULTRASOUND (EUS) (Bilateral) ESOPHAGOGASTRODUODENOSCOPY (EGD) (N/A)  Patient Location: PACU and Endoscopy Unit  Anesthesia Type:MAC  Level of Consciousness: awake, alert  and oriented  Airway & Oxygen Therapy: Patient Spontanous Breathing and Patient connected to nasal cannula oxygen  Post-op Assessment: Report given to RN and Post -op Vital signs reviewed and stable  Post vital signs: Reviewed and stable  Last Vitals:  Vitals:   11/06/21 0736  BP: 123/77  Pulse: 65  Resp: 10  Temp: 36.6 C  SpO2: (!) 2%    Complications: No apparent anesthesia complications

## 2021-11-06 NOTE — Discharge Instructions (Signed)
Endoscopy °Care After °Please read the instructions outlined below and refer to this sheet in the next few weeks. These discharge instructions provide you with general information on caring for yourself after you leave the hospital. Your doctor may also give you specific instructions. While your treatment has been planned according to the most current medical practices available, unavoidable complications occasionally occur. If you have any problems or questions after discharge, please call Dr. Milind Raether ([facility]) at [phone]. ° °HOME CARE INSTRUCTIONS °Activity °· You may resume your regular activity but move at a slower pace for the next 24 hours.  °· Take frequent rest periods for the next 24 hours.  °· Walking will help expel (get rid of) the air and reduce the bloated feeling in your abdomen.  °· No driving for 24 hours (because of the anesthesia (medicine) used during the test).  °· You may shower.  °· Do not sign any important legal documents or operate any machinery for 24 hours (because of the anesthesia used during the test).  °Nutrition °· Drink plenty of fluids.  °· You may resume your normal diet.  °· Begin with a light meal and progress to your normal diet.  °· Avoid alcoholic beverages for 24 hours or as instructed by your caregiver.  °Medications °You may resume your normal medications unless your caregiver tells you otherwise. °What you can expect today °· You may experience abdominal discomfort such as a feeling of fullness or "gas" pains.  °· You may experience a sore throat for 2 to 3 days. This is normal. Gargling with salt water may help this.  °·  °SEEK IMMEDIATE MEDICAL CARE IF: °· You have excessive nausea (feeling sick to your stomach) and/or vomiting.  °· You have severe abdominal pain and distention (swelling).  °· You have trouble swallowing.  °· You have a temperature over 100° F (37.8° C).  °· You have rectal bleeding or vomiting of blood.  °Document Released:  08/07/2003 Document Revised: 09/04/2010 Document Reviewed: 02/17/2007 °ExitCare® Patient Information ©2012 [employer]. °

## 2021-11-06 NOTE — Progress Notes (Signed)
I spoke with Mr and Mrs Crispo regarding appt Monday 11/6 at 1600 with Dr Burr Medico and myself. All questions were answered.  They verbalized understanding.

## 2021-11-06 NOTE — H&P (Signed)
[facility] H/P  Note  Chief Complaint: gastric cancer  HPI: Jonathan Maynard is an 51 y.o. male.  Prior GI bleeding.  Had endoscopy ultimately diagnosed gastric cancer.  Here today for locoregional staging.  Past Medical History:  Diagnosis Date   Acute upper GI bleed 04/12/2021   Class 1 obesity 04/12/2021   Diverticulosis 04/12/2021   Hepatic steatosis 04/12/2021    Past Surgical History:  Procedure Laterality Date   BIOPSY  04/12/2021   Procedure: BIOPSY;  Surgeon: Wilford Corner, MD;  Location: [facility]OSCOPY;  Service: Gastroenterology;;   ESOPHAGOGASTRODUODENOSCOPY N/A 04/12/2021   Procedure: ESOPHAGOGASTRODUODENOSCOPY (EGD);  Surgeon: Wilford Corner, MD;  Location: [facility];  Service: Gastroenterology;  Laterality: N/A;   HERNIA REPAIR      Medications Prior to Admission  Medication Sig Dispense Refill   ferrous sulfate 325 (65 FE) MG tablet Take 1 tablet (325 mg total) by mouth daily. 30 tablet 3   pantoprazole (PROTONIX) 40 MG tablet Take 1 tablet (40 mg total) by mouth 2 (two) times daily. 60 tablet 3    Allergies: No Known Allergies  Family History  Problem Relation Age of Onset   Diabetes Mother     Social History:  reports that he has quit smoking. His smoking use included cigarettes. He has a 17.50 pack-year smoking history. He has never used smokeless tobacco. He reports that he does not currently use alcohol. He reports that he does not use drugs.   ROS: As per HPI, all others negative   Blood pressure 123/77, pulse 65, temperature 97.9 F (36.6 C), temperature source Tympanic, resp. rate 10, height '5\' 5"'$  (1.651 m), weight 82.8 kg, SpO2 (!) 2 %. General appearance: NAD HEENT:  [place]/AT, anicteric NECK:  Supple CV:  Regular RESP:  Not labored ABD:  Soft, non-tender NEURO:  A/O  No results found for this or any previous visit (from the past 21 hour(s)). No results found.  Assessment/Plan   Gastric cancer. Upper endoscopic  ultrasound for locoregional staging. Risks (bleeding, infection, bowel perforation that could require surgery, sedation-related changes in cardiopulmonary systems), benefits (identification and possible treatment of source of symptoms, exclusion of certain causes of symptoms), and alternatives (watchful waiting, radiographic imaging studies, empiric medical treatment) of upper endoscopy with ulttrasound (EGD + EUS) were explained to patient/family in detail and patient wishes to proceed.   Landry Dyke 11/06/2021, 8:33 AM

## 2021-11-06 NOTE — Anesthesia Postprocedure Evaluation (Signed)
Anesthesia Post Note  Patient: Jonathan Maynard  Procedure(s) Performed: UPPER ESOPHAGEAL ENDOSCOPIC ULTRASOUND (EUS) (Bilateral) ESOPHAGOGASTRODUODENOSCOPY (EGD)     Patient location during evaluation: Endoscopy Anesthesia Type: MAC Level of consciousness: awake and alert Pain management: pain level controlled Vital Signs Assessment: post-procedure vital signs reviewed and stable Respiratory status: spontaneous breathing, nonlabored ventilation and respiratory function stable Cardiovascular status: stable and blood pressure returned to baseline Postop Assessment: no apparent nausea or vomiting Anesthetic complications: no   No notable events documented.  Last Vitals:  Vitals:   11/06/21 0930 11/06/21 0939  BP: 106/62 118/73  Pulse: (!) 51 60  Resp: 17 18  Temp:    SpO2: 96% 100%    Last Pain:  Vitals:   11/06/21 0939  TempSrc:   PainSc: 0-No pain                 Bronwyn Belasco,W. EDMOND

## 2021-11-07 ENCOUNTER — Other Ambulatory Visit: Payer: Self-pay

## 2021-11-07 DIAGNOSIS — C163 Malignant neoplasm of pyloric antrum: Secondary | ICD-10-CM

## 2021-11-07 NOTE — Progress Notes (Signed)
I spoke with Mrs Jonathan Maynard as Mr Jonathan Maynard is at work.  I explained that dr Burr Medico will recommending systemic chemotherapy. I reviewed that Mr Jonathan Maynard will need chemo education class and port placement prior to starting chemotherapy.  I told her that schedulers will be calling to make these appts. I told her I will be meeting with them on Monday after his appt with Dr Burr Medico. All questions were answered.  She verbalized understanding.

## 2021-11-08 ENCOUNTER — Ambulatory Visit (HOSPITAL_BASED_OUTPATIENT_CLINIC_OR_DEPARTMENT_OTHER)
Admission: RE | Admit: 2021-11-08 | Discharge: 2021-11-08 | Disposition: A | Discharge status: Home / Self Care | Payer: BC Managed Care – PPO | Source: Ambulatory Visit | Attending: Physician Assistant | Admitting: Physician Assistant

## 2021-11-08 DIAGNOSIS — C163 Malignant neoplasm of pyloric antrum: Secondary | ICD-10-CM

## 2021-11-08 MED ORDER — IOHEXOL 9 MG/ML PO SOLN
500.0000 mL | Freq: Once | ORAL | Status: AC
  Administered 2021-11-08: 500 mL via ORAL

## 2021-11-08 MED ORDER — IOHEXOL 300 MG/ML  SOLN
100.0000 mL | Freq: Once | INTRAMUSCULAR | Status: AC | PRN
  Administered 2021-11-08: 100 mL via INTRAVENOUS

## 2021-11-08 NOTE — Research (Signed)
Trial:  [employer] 2021-05 - Specimen Collection Study to Evaluate Biomarkers in Subjects with Cancer  Patient Jonathan Maynard was identified by NP Cassie as a potential candidate for the above listed study.  This Clinical Research Nurse met with Ubaldo Glassing, [id number], on 11/08/21 in a manner and location that ensures patient privacy to discuss participation in the above listed research study.  Patient is Accompanied by his wife .  A copy of the informed consent document with embedded HIPAA language was provided to the patient.  Patient reads, speaks, and understands [place].   Patient was provided with the business card of this Nurse and encouraged to contact the research team with any questions.  Approximately 15 minutes were spent with the patient reviewing the informed consent documents.  Patient was provided the option of taking informed consent documents home to review and was encouraged to review at their convenience with their support network, including other care providers. Patient took the consent documents home to review. Plan made to follow up via phone on Thursday 11/14/21. Marjie Skiff Tayquan Gassman, RN, BSN, [facility]  Her  [facility] Clinical Research Nurse [place] [phone]  Pager [phone] 11/08/2021 3:32 PM

## 2021-11-10 ENCOUNTER — Encounter (HOSPITAL_COMMUNITY): Payer: Self-pay | Admitting: Gastroenterology

## 2021-11-11 ENCOUNTER — Other Ambulatory Visit: Payer: Self-pay

## 2021-11-11 ENCOUNTER — Inpatient Hospital Stay: Payer: BC Managed Care – PPO | Attending: Physician Assistant | Admitting: Hematology

## 2021-11-11 ENCOUNTER — Encounter: Payer: Self-pay | Admitting: Hematology

## 2021-11-11 VITALS — BP 103/67 | HR 96 | Temp 99.3°F | Resp 17 | Ht 65.0 in | Wt 182.4 lb

## 2021-11-11 DIAGNOSIS — D500 Iron deficiency anemia secondary to blood loss (chronic): Secondary | ICD-10-CM | POA: Diagnosis not present

## 2021-11-11 DIAGNOSIS — Z5111 Encounter for antineoplastic chemotherapy: Secondary | ICD-10-CM | POA: Insufficient documentation

## 2021-11-11 DIAGNOSIS — Z8711 Personal history of peptic ulcer disease: Secondary | ICD-10-CM | POA: Insufficient documentation

## 2021-11-11 DIAGNOSIS — F17210 Nicotine dependence, cigarettes, uncomplicated: Secondary | ICD-10-CM | POA: Diagnosis not present

## 2021-11-11 DIAGNOSIS — K922 Gastrointestinal hemorrhage, unspecified: Secondary | ICD-10-CM | POA: Insufficient documentation

## 2021-11-11 DIAGNOSIS — C163 Malignant neoplasm of pyloric antrum: Secondary | ICD-10-CM

## 2021-11-11 DIAGNOSIS — Z8619 Personal history of other infectious and parasitic diseases: Secondary | ICD-10-CM | POA: Diagnosis not present

## 2021-11-11 DIAGNOSIS — Z5189 Encounter for other specified aftercare: Secondary | ICD-10-CM | POA: Insufficient documentation

## 2021-11-11 MED ORDER — LIDOCAINE-PRILOCAINE 2.5-2.5 % EX CREA
TOPICAL_CREAM | CUTANEOUS | 3 refills | Status: DC
  Filled 2021-11-11: qty 30, 30d supply, fill #0
  Filled 2022-04-26: qty 30, 30d supply, fill #1

## 2021-11-11 MED ORDER — ONDANSETRON HCL 8 MG PO TABS
8.0000 mg | ORAL_TABLET | Freq: Three times a day (TID) | ORAL | 1 refills | Status: DC | PRN
  Filled 2021-11-11: qty 9, 3d supply, fill #0
  Filled 2021-12-14: qty 9, 3d supply, fill #1
  Filled 2022-01-03 – 2022-01-18 (×2): qty 9, 3d supply, fill #2
  Filled 2022-03-21: qty 9, 3d supply, fill #3

## 2021-11-11 MED ORDER — PROCHLORPERAZINE MALEATE 10 MG PO TABS
10.0000 mg | ORAL_TABLET | Freq: Four times a day (QID) | ORAL | 1 refills | Status: DC | PRN
  Filled 2021-11-11: qty 30, 8d supply, fill #0
  Filled 2021-12-14: qty 30, 8d supply, fill #1

## 2021-11-11 MED ORDER — DEXAMETHASONE 4 MG PO TABS
4.0000 mg | ORAL_TABLET | Freq: Every day | ORAL | 1 refills | Status: DC
  Filled 2021-11-11: qty 30, 30d supply, fill #0
  Filled 2021-12-14: qty 30, 30d supply, fill #1

## 2021-11-11 NOTE — Progress Notes (Signed)
I met with Jonathan Maynard and his wife after  his appt with Dr [name]. I explained the services provided at [facility] and provided written information.  I explained the [employer]ight grant and let  him know one of the financial advisors will reach out to  him at the time of his chemo education class. I briefly explained insertion and care of a port a cath.  I showed a sample of the port a cath. He is schedule for port placement on 11/15/2021.  He is already schedule for chemo ed class. I reviewed our process for completing FMLA and short term disability forms. I told him our schedulers will call him with his chemotherapy appts.  All questions were answered. He verbalized understanding.  

## 2021-11-11 NOTE — Progress Notes (Signed)
[facility]   Telephone:(336) [phone] Fax:(336) [phone]   Clinic Follow up Note   Patient Care Team: Pcp, No as PCP - General  Date of Service:  11/11/2021  CHIEF COMPLAINT: f/u of gastric cancer  CURRENT THERAPY:  Pending FLOT  ASSESSMENT & PLAN:  Jonathan Maynard is a 51 y.o. male with   1. Gastric Cancer, stage III, cT3N3M0, stage III, MMR normal -diagnosed 10/18/21 by EGD for 6 month f/u of gastric ulcers. Procedure showed mucosal intramucosal adenocarcinoma. MMR normal. -baseline CEA 11/01/21 significantly elevated at 1,989.32. -he met Dr. Kieth Brightly on 11/05/21. -EUS on 11/06/21 by Dr. Paulita Fujita, staged as T3 N3 (at least stage III) -staging CT CAP 11/08/21 showed: stable gastric antrum wall thickening; increased size of upper abdominal lymph nodes; stable left adrenal nodule, 1.9 cm. --I reviewed the recent scan results with the patient and his wife today.  The left adrenal gland nodule is likely benign, given overall stable size in the past 6 months.  However metastatic disease is not completely ruled out, I recommend a PET scan for further evaluation.   -Given his stage III disease, the recommendation is for neoadjuvant chemotherapy prior to proceeding with surgery. I discussed the standard chemotherapy regimen according to NCCN, which is FLOT (Fluorouracil/5FU, Leucovorin, Oxaliplatin, and Taxotere). This consists of IV chemo here with a pump taken home for 24 hr (return to cancer center for disconnection), given every 2 weeks for a total of 4 months --Chemotherapy consent: Side effects including but does not not limited to, fatigue, nausea, vomiting, diarrhea, hair loss, cold sensitivity and neuropathy, fluid retention, renal and kidney dysfunction, neutropenic fever, needed for blood transfusion, bleeding, were discussed with patient in great detail. He agrees to proceed. -I also recommend obtaining PET scan to rule out malignancy in the adrenal nodule and  other distant metastases. We will try to complete this before he starts chemo. -he is scheduled for port placement on 11/15/21. I will call in emla cream for him to use starting a week later. We will try to schedule treatment to accommodate his work schedule and upcoming Venofer infusions.   2. Anemia of iron deficiency, h/o GI bleeding -hospitalized in 04/2021 for GI bleed secondary to gastric ulcers from NSAID use and H. Pylori. He received one dose IV ferric gluconate during admission. -he was prescribed oral ferrous sulfate, discontinued mid 10/2021 due to concerns for LE edema. Restarted after consultation on 11/01/21. -labs on 10/27 consistent with iron deficiency-- iron 25, ferritin 6, hgb 8.5. -he is scheduled for IV Venofer on 11/11, 11/18, and 11/25.     Plan: -chemo education 11/8 -port placement 11/10 -IV Venofer 11/11 -lab, flush, f/u, and FLOT 11/17 -pump d/c and IV Venofer 11/18   No problem-specific Assessment & Plan notes found for this encounter.   SUMMARY OF ONCOLOGIC HISTORY: Oncology History Overview Note   Cancer Staging  Gastric cancer [facility]) Staging form: Stomach, AJCC 8th Edition - Clinical stage from 10/18/2021: Stage III (cT3, cN3a, cM0) - Signed by Truitt Merle, MD on 11/10/2021     Gastric cancer ([place])  10/18/2021 Procedure   EGD performed under the care of Dr. Michail Sermon  Findings:  -A large, ulcerated, partially circumferential (involving two thirds of the lumen circumference) mass with no bleeding and stigmata of recent bleeding was found in the gastric antrum, at the pylorus and in the prepyloric region of the stomach. -Segmental severe inflammation characterized by congestion (edema) and erythema was found in the gastric antrum.  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 Negative for dysplasia.  Negative for helicobacter pylori  Mismatch Repair (MMR) Protein Imunohistochemistry (IHC): IHC Expression Result: MLH1: Preserved nuclear expression. MSH2: Preserved nuclear expression. MSH6: Preserved nuclear expression. PMS2: Preserved nuclear expression. Interpretation: NORMAL   10/31/2021 Initial Diagnosis   Gastric cancer ([place])   11/01/2021 Tumor Marker   CEA = 0,623.76 (^)   11/06/2021 Procedure   Upper EUS, Dr. Paulita Fujita  Impression: - Normal esophagus. - A small amount of food (residue) in the stomach. - Congested, friable (with contact bleeding) and ulcerated mucosa in the prepyloric region of the stomach. - Normal examined duodenum. - There was no evidence of significant pathology in the left lobe of the liver. - Many abnormal lymph nodes (over 10) were visualized in the gastrohepatic ligament (level 18), celiac region (level 20), perigastric region, peripancreatic region and aortocaval region. - Wall thickening was seen in the prepyloric region of the stomach. The thickening appeared to be primarily within the deep mucosa (Layer 2) but extended through the muscularis propria. - Overall constellation of findings consistent with at least T3 N3 Mx (at least stage III) gastric adenocarcinoma.   11/08/2021 Imaging   EXAM: CT CHEST, ABDOMEN, AND PELVIS WITH CONTRAST  IMPRESSION: 1. Similar irregular wall thickening of the gastric antrum, consistent with patient's known primary gastric neoplasm. 2. Increased size of the upper abdominal lymph nodes, concerning for worsening nodal disease involvement. 3. Stable left adrenal nodule common nonspecific possibly a metastatic lesion or adenoma. 4. No evidence of  metastatic disease in the chest. 5. Left-sided colonic diverticulosis without findings of acute diverticulitis. 6. Enlarged prostate gland. 7.  Aortic Atherosclerosis (ICD10-I70.0).   11/22/2021 -  Chemotherapy   Patient is on Treatment Plan : GASTROESOPHAGEAL FLOT q14d X 4 cycles        INTERVAL HISTORY:  Jonathan Maynard is here for a follow up of gastric cancer. He was last seen by me with PA Cassie on 11/01/21 in consultation. He presents to the clinic accompanied by his wife. He reports he is feeling well, no new concerns.   All other systems were reviewed with the patient and are negative.  MEDICAL HISTORY:  Past Medical History:  Diagnosis Date   Acute upper GI bleed 04/12/2021   Class 1 obesity 04/12/2021   Diverticulosis 04/12/2021   Hepatic steatosis 04/12/2021    SURGICAL HISTORY: Past Surgical History:  Procedure Laterality Date   BIOPSY  04/12/2021   Procedure: BIOPSY;  Surgeon: Wilford Corner, MD;  Location: [facility];  Service: Gastroenterology;;   ESOPHAGOGASTRODUODENOSCOPY N/A 04/12/2021   Procedure: ESOPHAGOGASTRODUODENOSCOPY (EGD);  Surgeon: Wilford Corner, MD;  Location: [facility];  Service: Gastroenterology;  Laterality: N/A;   ESOPHAGOGASTRODUODENOSCOPY N/A 11/06/2021   Procedure: ESOPHAGOGASTRODUODENOSCOPY (EGD);  Surgeon: Arta Silence, MD;  Location: [facility];  Service: Gastroenterology;  Laterality: N/A;   HERNIA REPAIR     UPPER ESOPHAGEAL ENDOSCOPIC ULTRASOUND (EUS) Bilateral 11/06/2021   Procedure: UPPER ESOPHAGEAL ENDOSCOPIC ULTRASOUND (EUS);  Surgeon: Arta Silence, MD;  Location: [facility];  Service: Gastroenterology;  Laterality: Bilateral;    I have reviewed the social history and family history  with the patient and they are unchanged from previous note.  ALLERGIES:  has No Known Allergies.  MEDICATIONS:  Current Outpatient Medications  Medication Sig Dispense Refill   ferrous sulfate 325 (65 FE) MG tablet Take  1 tablet (325 mg total) by mouth daily. 30 tablet 3   lidocaine-prilocaine (EMLA) cream Apply to affected area once 30 g 3   ondansetron (ZOFRAN) 8 MG tablet Take 1 tablet (8 mg total) by mouth every 8 (eight) hours as needed for nausea or vomiting. Start on the third day after chemotherapy. 30 tablet 1   pantoprazole (PROTONIX) 40 MG tablet Take 1 tablet (40 mg total) by mouth 2 (two) times daily. 60 tablet 3   prochlorperazine (COMPAZINE) 10 MG tablet Take 1 tablet (10 mg total) by mouth every 6 (six) hours as needed for nausea or vomiting. 30 tablet 1   No current facility-administered medications for this visit.    PHYSICAL EXAMINATION: ECOG PERFORMANCE STATUS: 1 - Symptomatic but completely ambulatory  Vitals:   11/11/21 1601  BP: 103/67  Pulse: 96  Resp: 17  Temp: 99.3 F (37.4 C)  SpO2: 100%   Wt Readings from Last 3 Encounters:  11/11/21 182 lb 6.4 oz (82.7 kg)  11/06/21 182 lb 8.7 oz (82.8 kg)  11/01/21 182 lb 9.6 oz (82.8 kg)     GENERAL:alert, no distress and comfortable SKIN: skin color normal, no rashes or significant lesions EYES: normal, Conjunctiva are pink and non-injected, sclera clear  NEURO: alert & oriented x 3 with fluent speech  LABORATORY DATA:  I have reviewed the data as listed    Latest Ref Rng & Units 11/01/2021    3:12 PM 04/16/2021    5:33 AM 04/15/2021    4:33 AM  CBC  WBC 4.0 - 10.5 K/uL 6.4  8.6  8.8   Hemoglobin 13.0 - 17.0 g/dL 8.5  8.3  7.7   Hematocrit 39.0 - 52.0 % 25.7  25.5  23.9   Platelets 150 - 400 K/uL 230  321  320         Latest Ref Rng & Units 11/01/2021    3:12 PM 04/16/2021    5:33 AM 04/15/2021    4:33 AM  CMP  Glucose 70 - 99 mg/dL 105  115  104   BUN 6 - 20 mg/dL _0 Creatinine 0.61 - 1.24 mg/dL 1.03  0.96  1.04   Sodium 135 - 145 mmol/L 141  135  136   Potassium 3.5 - 5.1 mmol/L 4.2  4.4  4.3   Chloride 98 - 111 mmol/L 110  103  104   CO2 22 - 32 mmol/L _1 Calcium 8.9 - 10.3 mg/dL 8.7  8.9   8.8   Total Protein 6.5 - 8.1 g/dL 6.9     Total Bilirubin 0.3 - 1.2 mg/dL 0.3     Alkaline Phos 38 - 126 U/L 77     AST 15 - 41 U/L 13     ALT 0 - 44 U/L 8         RADIOGRAPHIC STUDIES: I have personally reviewed the radiological images as listed and agreed with the findings in the report. No results found.    Orders Placed This Encounter  Procedures   NM PET Image Initial (PI) Skull Base To Thigh    Standing Status:   Future    Standing Expiration Date:   11/11/2022  Order Specific Question:   If indicated for the ordered procedure, I authorize the administration of a radiopharmaceutical per Radiology protocol    Answer:   Yes    Order Specific Question:   Preferred imaging location?    Answer:   [place]   CBC with Differential (Cancer Center Only)    Standing Status:   Future    Standing Expiration Date:   11/23/2022   CMP ([place] only)    Standing Status:   Future    Standing Expiration Date:   11/23/2022   CBC with Differential ([place] Only)    Standing Status:   Future    Standing Expiration Date:   12/07/2022   CMP ([place] only)    Standing Status:   Future    Standing Expiration Date:   12/07/2022   All questions were answered. The patient knows to call the clinic with any problems, questions or concerns. No barriers to learning was detected. The total time spent in the appointment was 40 minutes.     Truitt Merle, MD 11/11/2021   I, Wilburn Mylar, am acting as scribe for Truitt Merle, MD.   I have reviewed the above documentation for accuracy and completeness, and I agree with the above.

## 2021-11-12 ENCOUNTER — Other Ambulatory Visit (HOSPITAL_COMMUNITY): Payer: Self-pay

## 2021-11-13 ENCOUNTER — Other Ambulatory Visit: Payer: Self-pay

## 2021-11-13 ENCOUNTER — Inpatient Hospital Stay: Payer: BC Managed Care – PPO

## 2021-11-13 ENCOUNTER — Encounter: Payer: Self-pay | Admitting: Hematology

## 2021-11-13 NOTE — Progress Notes (Signed)
The proposed treatment discussed in conference is for discussion purpose only and is not a binding recommendation.  The patients have not been physically examined, or presented with their treatment options.  Therefore, final treatment plans cannot be decided.  

## 2021-11-13 NOTE — Addendum Note (Signed)
Addended by: Truitt Merle on: 11/13/2021 07:41 PM   Modules accepted: Orders

## 2021-11-14 ENCOUNTER — Other Ambulatory Visit: Payer: Self-pay

## 2021-11-14 ENCOUNTER — Other Ambulatory Visit: Payer: Self-pay | Admitting: Internal Medicine

## 2021-11-14 DIAGNOSIS — C169 Malignant neoplasm of stomach, unspecified: Secondary | ICD-10-CM

## 2021-11-14 NOTE — H&P (Signed)
Chief Complaint: Patient was seen in consultation today for gastric cancer at the request of [name],[name]  Referring Physician(s): [name],[name]  Supervising Physician: Mir, Sharen Heck  Patient Status: [facility] - Out-pt  History of Present Illness: Jonathan Maynard is a 51 y.o. male outpatient with history of GI bleeding and diverticulosis.  He is being treated for stage III gastric cancer diagnosed 10/18/21.  He will begin chemotherapy and has been referred to IR for Port-a-Cath placement.  Past Medical History:  Diagnosis Date   Acute upper GI bleed 04/12/2021   Class 1 obesity 04/12/2021   Diverticulosis 04/12/2021   Hepatic steatosis 04/12/2021    Past Surgical History:  Procedure Laterality Date   BIOPSY  04/12/2021   Procedure: BIOPSY;  Surgeon: Wilford Corner, MD;  Location: [facility];  Service: Gastroenterology;;   ESOPHAGOGASTRODUODENOSCOPY N/A 04/12/2021   Procedure: ESOPHAGOGASTRODUODENOSCOPY (EGD);  Surgeon: Wilford Corner, MD;  Location: [facility];  Service: Gastroenterology;  Laterality: N/A;   ESOPHAGOGASTRODUODENOSCOPY N/A 11/06/2021   Procedure: ESOPHAGOGASTRODUODENOSCOPY (EGD);  Surgeon: Arta Silence, MD;  Location: [facility];  Service: Gastroenterology;  Laterality: N/A;   HERNIA REPAIR     UPPER ESOPHAGEAL ENDOSCOPIC ULTRASOUND (EUS) Bilateral 11/06/2021   Procedure: UPPER ESOPHAGEAL ENDOSCOPIC ULTRASOUND (EUS);  Surgeon: Arta Silence, MD;  Location: [facility];  Service: Gastroenterology;  Laterality: Bilateral;    Allergies: Patient has no known allergies.  Medications: Prior to Admission medications   Medication Sig Start Date End Date Taking? Authorizing Provider  dexamethasone (DECADRON) 4 MG tablet Take 1 tablet (4 mg total) by mouth daily. Take 1 tab twice the day before chemo, then once daily for 2 days after chemo 11/11/21   Truitt Merle, MD  ferrous sulfate 325 (65 FE) MG tablet Take 1 tablet (325 mg total) by mouth daily. 04/16/21  04/16/22  Elodia Florence., MD  lidocaine-prilocaine (EMLA) cream Apply to affected area as directed once 11/11/21   Truitt Merle, MD  ondansetron (ZOFRAN) 8 MG tablet Take 1 tablet (8 mg total) by mouth every 8 (eight) hours as needed for nausea or vomiting. Start on the third day after chemotherapy. 11/11/21   Truitt Merle, MD  pantoprazole (PROTONIX) 40 MG tablet Take 1 tablet (40 mg total) by mouth 2 (two) times daily. 10/23/21     prochlorperazine (COMPAZINE) 10 MG tablet Take 1 tablet (10 mg total) by mouth every 6 (six) hours as needed for nausea or vomiting. 11/11/21   Truitt Merle, MD     Family History  Problem Relation Age of Onset   Diabetes Mother     Social History   Socioeconomic History   Marital status: Married    Spouse name: Not on file   Number of children: Not on file   Years of education: Not on file   Highest education level: Not on file  Occupational History   Not on file  Tobacco Use   Smoking status: Former    Packs/day: 0.50    Years: 35.00    Total pack years: 17.50    Types: Cigarettes   Smokeless tobacco: Never  Substance and Sexual Activity   Alcohol use: Not Currently   Drug use: Never   Sexual activity: Not on file  Other Topics Concern   Not on file  Social History Narrative   Not on file   Social Determinants of Health   Financial Resource Strain: Not on file  Food Insecurity: Not on file  Transportation Needs: Not on file  Physical Activity: Not on  file  Stress: Not on file  Social Connections: Not on file     Review of Systems ; denies fever,HA,CP,dyspnea, cough, abd /back pain,N/V or bleeding  Vital Signs:pend    Physical Exam : awake/alert; chest- CTA bilat; heart- RRR; abd- soft,+BS,NT; no LE edema  Imaging: CT CHEST ABDOMEN PELVIS W CONTRAST  Result Date: 11/09/2021 CLINICAL DATA:  Gastric cancer, staging. Malignant neoplasm of the pyloric antrum. EXAM: CT CHEST, ABDOMEN, AND PELVIS WITH CONTRAST TECHNIQUE: Multidetector CT  imaging of the chest, abdomen and pelvis was performed following the standard protocol during bolus administration of intravenous contrast. RADIATION DOSE REDUCTION: This exam was performed according to the departmental dose-optimization program which includes automated exposure control, adjustment of the mA and/or kV according to patient size and/or use of iterative reconstruction technique. CONTRAST:  131m OMNIPAQUE IOHEXOL 300 MG/ML  SOLN COMPARISON:  CT April 12, 2021. FINDINGS: CT CHEST FINDINGS Cardiovascular: Aortic atherosclerosis. Normal caliber thoracic aorta. No central pulmonary embolus on this nondedicated study. Normal size heart. No significant pericardial effusion/thickening. Mediastinum/Nodes: No suspicious thyroid nodule. No pathologically enlarged mediastinal, hilar or axillary lymph nodes. The esophagus is grossly unremarkable. Lungs/Pleura: No suspicious pulmonary nodules or masses. Scarring/atelectasis in the lingula. No pleural effusion. No pneumothorax. Musculoskeletal: No aggressive lytic or blastic lesion of bone. Multilevel degenerative changes spine. CT ABDOMEN PELVIS FINDINGS Hepatobiliary: No suspicious hepatic lesion. Gallbladder is unremarkable. No biliary ductal dilation. Pancreas: No pancreatic ductal dilation or evidence of acute inflammation. Spleen: No splenomegaly or focal splenic lesion. Adrenals/Urinary Tract: Left adrenal nodule measures 19 mm on image 57/2 previously 20 mm. Right adrenal gland appears normal. No hydronephrosis. Kidneys demonstrate symmetric enhancement and excretion of contrast material. Urinary bladder is unremarkable for degree of distension. Stomach/Bowel: Similar irregular wall thickening of the gastric antrum for instance on image 62/2. No pathologic dilation of small or large bowel. The appendix and terminal ileum appear normal. Scattered left-sided colonic diverticulosis without findings of acute diverticulitis. Vascular/Lymphatic: Normal caliber  abdominal aorta. Increased size of the gastrohepatic ligament, hepatoduodenal ligament, celiac axis, hypogastric mesenteric and portacaval lymph nodes. For reference: -gastrohepatic ligament lymph node measures 14 mm in short axis on image 54/2 previously 6 mm in short axis. -hypogastric mesenteric lymph node measures 15 mm in short axis on image 67/2 previously 8 mm. No pelvic adenopathy. Reproductive: Enlarged prostate gland. Other: No significant abdominopelvic free fluid. No discrete peritoneal or omental nodularity. Musculoskeletal: No aggressive lytic or blastic lesion of bone. IMPRESSION: 1. Similar irregular wall thickening of the gastric antrum, consistent with patient's known primary gastric neoplasm. 2. Increased size of the upper abdominal lymph nodes, concerning for worsening nodal disease involvement. 3. Stable left adrenal nodule common nonspecific possibly a metastatic lesion or adenoma. 4. No evidence of metastatic disease in the chest. 5. Left-sided colonic diverticulosis without findings of acute diverticulitis. 6. Enlarged prostate gland. 7.  Aortic Atherosclerosis (ICD10-I70.0). Electronically Signed   By: JDahlia BailiffM.D.   On: 11/09/2021 16:19    Labs:  CBC: Recent Labs    04/14/21 0455 04/14/21 1708 04/15/21 0433 04/16/21 0533 11/01/21 1512  WBC 9.0  --  8.8 8.6 6.4  HGB 7.9* 7.7* 7.7* 8.3* 8.5*  HCT 24.2* 23.5* 23.9* 25.5* 25.7*  PLT 315  --  320 321 230    COAGS: No results for input(s): "INR", "APTT" in the last 8760 hours.  BMP: Recent Labs    04/14/21 0455 04/15/21 0433 04/16/21 0533 11/01/21 1512  NA 135 136 135 141  K 4.4  4.3 4.4 4.2  CL 100 104 103 110  CO2 '29 26 26 29  '$ GLUCOSE 94 104* 115* 105*  BUN '15 14 14 8  '$ CALCIUM 8.7* 8.8* 8.9 8.7*  CREATININE 1.08 1.04 0.96 1.03  GFRNONAA >60 >60 >60 >60    LIVER FUNCTION TESTS: Recent Labs    04/12/21 0435 04/13/21 0825 11/01/21 1512  BILITOT 0.6 0.7 0.3  AST 15 15 13*  ALT '11 9 8  '$ ALKPHOS 69  57 77  PROT 7.7 6.2* 6.9  ALBUMIN 4.1 3.2* 3.4*    TUMOR MARKERS: Recent Labs    11/01/21 1512  CEA 1,989.58*    Assessment and Plan:  51 year old male with diagnosis of gastric cancer planning to begin chemotherapy.  He presents for Port-a-Cath placement.  He is appropriately NPO. --OK to proceed with Port placement with expected discharge later today  Risks and benefits of image guided port-a-catheter placement was discussed with the patient/spouse including, but not limited to bleeding, infection, pneumothorax, or fibrin sheath development and need for additional procedures.  All of the patient's questions were answered, patient is agreeable to proceed. Consent signed and in chart.   Thank you for this interesting consult.  I greatly enjoyed meeting Penny Frisbie and look forward to participating in their care.  A copy of this report was sent to the requesting provider on this date.  Electronically Signed: Pasty Spillers, PA/Kevin Darvin Dials,PA-C 11/14/2021, 1:54 PM   I spent a total of 20 minutes  in face to face in clinical consultation, greater than 50% of which was counseling/coordinating care for port a cath placement

## 2021-11-15 ENCOUNTER — Ambulatory Visit (HOSPITAL_COMMUNITY)
Admission: RE | Admit: 2021-11-15 | Discharge: 2021-11-15 | Disposition: A | Discharge status: Home / Self Care | Payer: BC Managed Care – PPO | Source: Ambulatory Visit | Attending: Hematology | Admitting: Hematology

## 2021-11-15 ENCOUNTER — Encounter (HOSPITAL_COMMUNITY): Payer: Self-pay

## 2021-11-15 DIAGNOSIS — C163 Malignant neoplasm of pyloric antrum: Secondary | ICD-10-CM | POA: Diagnosis present

## 2021-11-15 DIAGNOSIS — C169 Malignant neoplasm of stomach, unspecified: Secondary | ICD-10-CM

## 2021-11-15 HISTORY — PX: IR IMAGING GUIDED PORT INSERTION: IMG5740

## 2021-11-15 MED ORDER — LIDOCAINE-EPINEPHRINE 1 %-1:100000 IJ SOLN
INTRAMUSCULAR | Status: AC
  Administered 2021-11-15: 13 mL via INTRADERMAL
  Filled 2021-11-15: qty 1

## 2021-11-15 MED ORDER — FENTANYL CITRATE (PF) 100 MCG/2ML IJ SOLN
INTRAMUSCULAR | Status: DC | PRN
  Administered 2021-11-15 (×3): 50 ug via INTRAVENOUS

## 2021-11-15 MED ORDER — FENTANYL CITRATE (PF) 100 MCG/2ML IJ SOLN
INTRAMUSCULAR | Status: AC
  Filled 2021-11-15: qty 4

## 2021-11-15 MED ORDER — HEPARIN SOD (PORK) LOCK FLUSH 100 UNIT/ML IV SOLN
INTRAVENOUS | Status: AC
  Administered 2021-11-15: 500 [IU] via INTRAVENOUS
  Filled 2021-11-15: qty 5

## 2021-11-15 MED ORDER — SODIUM CHLORIDE 0.9 % IV SOLN: INTRAVENOUS | Status: DC

## 2021-11-15 MED ORDER — MIDAZOLAM HCL 2 MG/2ML IJ SOLN
INTRAMUSCULAR | Status: DC | PRN
  Administered 2021-11-15 (×2): 1 mg via INTRAVENOUS

## 2021-11-15 MED ORDER — MIDAZOLAM HCL 2 MG/2ML IJ SOLN
INTRAMUSCULAR | Status: AC
  Filled 2021-11-15: qty 4

## 2021-11-15 NOTE — Progress Notes (Signed)
Pharmacist Chemotherapy Monitoring - Initial Assessment    Anticipated start date: 11/22/21   The following has been reviewed per standard work regarding the patient's treatment regimen: The patient's diagnosis, treatment plan and drug doses, and organ/hematologic function Lab orders and baseline tests specific to treatment regimen  The treatment plan start date, drug sequencing, and pre-medications Prior authorization status  Patient's documented medication list, including drug-drug interaction screen and prescriptions for anti-emetics and supportive care specific to the treatment regimen The drug concentrations, fluid compatibility, administration routes, and timing of the medications to be used The patient's access for treatment and lifetime cumulative dose history, if applicable  The patient's medication allergies and previous infusion related reactions, if applicable   Changes made to treatment plan:  N/A  Follow up needed:  N/A   Philomena Course, [place], 11/15/2021  12:38 PM

## 2021-11-15 NOTE — Procedures (Signed)
Interventional Radiology Procedure Note  Procedure: Port placement.  Indication: Gastric Malignancy  Findings: Please refer to procedural dictation for full description.  Complications: None  EBL: < 10 mL  Jonathan Roux, MD [phone]

## 2021-11-15 NOTE — Progress Notes (Signed)
Post procedure EKG obtained and assessed by Dr Dwaine Gale, who states Pt may eat and be discharged as planned.

## 2021-11-15 NOTE — Discharge Instructions (Signed)
Urgent needs - Interventional Radiology on call MD [phone]  Wound - May remove dressing and shower in 24 to 48 hours.  Keep site clean and dry.  Replace with bandaid as needed.  Do not submerge in tub or water until site healing well. If closed with glue, glue will flake off on its own.  If ordered by your provider, may start Emla cream in 2 weeks or after incision is healed.  After completion of treatment, your provider should have you set up for monthly port flushes.   

## 2021-11-16 ENCOUNTER — Inpatient Hospital Stay: Payer: BC Managed Care – PPO

## 2021-11-16 VITALS — BP 122/80 | HR 79 | Temp 98.6°F | Resp 18

## 2021-11-16 DIAGNOSIS — D500 Iron deficiency anemia secondary to blood loss (chronic): Secondary | ICD-10-CM

## 2021-11-16 DIAGNOSIS — Z5111 Encounter for antineoplastic chemotherapy: Secondary | ICD-10-CM | POA: Diagnosis not present

## 2021-11-16 MED ORDER — SODIUM CHLORIDE 0.9 % IV SOLN: Freq: Once | INTRAVENOUS | Status: AC

## 2021-11-16 MED ORDER — SODIUM CHLORIDE 0.9 % IV SOLN
300.0000 mg | Freq: Once | INTRAVENOUS | Status: AC
  Administered 2021-11-16: 300 mg via INTRAVENOUS
  Filled 2021-11-16: qty 300

## 2021-11-16 MED ORDER — HEPARIN SOD (PORK) LOCK FLUSH 100 UNIT/ML IV SOLN
500.0000 [IU] | Freq: Once | INTRAVENOUS | Status: AC | PRN
  Administered 2021-11-16: 500 [IU]

## 2021-11-16 MED ORDER — SODIUM CHLORIDE 0.9% FLUSH
10.0000 mL | Freq: Once | INTRAVENOUS | Status: AC | PRN
  Administered 2021-11-16: 10 mL

## 2021-11-16 MED ORDER — LORATADINE 10 MG PO TABS
10.0000 mg | ORAL_TABLET | Freq: Once | ORAL | Status: AC
  Administered 2021-11-16: 10 mg via ORAL
  Filled 2021-11-16: qty 1

## 2021-11-16 NOTE — Patient Instructions (Signed)

## 2021-11-21 MED FILL — Dexamethasone Sodium Phosphate Inj 100 MG/10ML: INTRAMUSCULAR | Qty: 1 | Status: AC

## 2021-11-22 ENCOUNTER — Encounter: Payer: Self-pay | Admitting: Hematology

## 2021-11-22 ENCOUNTER — Inpatient Hospital Stay: Payer: BC Managed Care – PPO

## 2021-11-22 ENCOUNTER — Inpatient Hospital Stay (HOSPITAL_BASED_OUTPATIENT_CLINIC_OR_DEPARTMENT_OTHER): Payer: BC Managed Care – PPO | Admitting: Hematology

## 2021-11-22 ENCOUNTER — Other Ambulatory Visit: Payer: Self-pay

## 2021-11-22 VITALS — BP 114/73 | HR 70 | Temp 98.2°F | Resp 17

## 2021-11-22 VITALS — BP 118/64 | HR 68 | Temp 98.6°F | Resp 15 | Wt 178.8 lb

## 2021-11-22 DIAGNOSIS — C163 Malignant neoplasm of pyloric antrum: Secondary | ICD-10-CM | POA: Diagnosis not present

## 2021-11-22 DIAGNOSIS — Z5111 Encounter for antineoplastic chemotherapy: Secondary | ICD-10-CM | POA: Diagnosis not present

## 2021-11-22 LAB — CMP (CANCER CENTER ONLY)
ALT: 7 U/L (ref 0–44)
AST: 15 U/L (ref 15–41)
Albumin: 3.6 g/dL (ref 3.5–5.0)
Alkaline Phosphatase: 73 U/L (ref 38–126)
Anion gap: 2 — ABNORMAL LOW (ref 5–15)
BUN: 14 mg/dL (ref 6–20)
CO2: 28 mmol/L (ref 22–32)
Calcium: 8.9 mg/dL (ref 8.9–10.3)
Chloride: 108 mmol/L (ref 98–111)
Creatinine: 0.71 mg/dL (ref 0.61–1.24)
GFR, Estimated: >60 mL/min (ref >60)
Glucose, Bld: 92 mg/dL (ref 70–99)
Potassium: 3.9 mmol/L (ref 3.5–5.1)
Sodium: 138 mmol/L (ref 135–145)
Total Bilirubin: 0.4 mg/dL (ref 0.3–1.2)
Total Protein: 7.2 g/dL (ref 6.5–8.1)

## 2021-11-22 LAB — RESEARCH LABS

## 2021-11-22 LAB — CBC WITH DIFFERENTIAL (CANCER CENTER ONLY)
Abs Immature Granulocytes: 0.04 10*3/uL (ref 0.00–0.07)
Basophils Absolute: 0.1 10*3/uL (ref 0.0–0.1)
Basophils Relative: 1 %
Eosinophils Absolute: 0.1 10*3/uL (ref 0.0–0.5)
Eosinophils Relative: 2 %
HCT: 27.8 % — ABNORMAL LOW (ref 39.0–52.0)
Hemoglobin: 9.1 g/dL — ABNORMAL LOW (ref 13.0–17.0)
Immature Granulocytes: 1 %
Lymphocytes Relative: 39 %
Lymphs Abs: 2.3 10*3/uL (ref 0.7–4.0)
MCH: 29.7 pg (ref 26.0–34.0)
MCHC: 32.7 g/dL (ref 30.0–36.0)
MCV: 90.8 fL (ref 80.0–100.0)
Monocytes Absolute: 0.6 10*3/uL (ref 0.1–1.0)
Monocytes Relative: 10 %
Neutro Abs: 2.8 10*3/uL (ref 1.7–7.7)
Neutrophils Relative %: 47 %
Platelet Count: 230 10*3/uL (ref 150–400)
RBC: 3.06 MIL/uL — ABNORMAL LOW (ref 4.22–5.81)
RDW: 16.2 % — ABNORMAL HIGH (ref 11.5–15.5)
WBC Count: 6 10*3/uL (ref 4.0–10.5)
nRBC: 0 % (ref 0.0–0.2)

## 2021-11-22 MED ORDER — DEXTROSE 5 % IV SOLN: Freq: Once | INTRAVENOUS | Status: AC

## 2021-11-22 MED ORDER — SODIUM CHLORIDE 0.9 % IV SOLN
50.0000 mg/m2 | Freq: Once | INTRAVENOUS | Status: AC
  Administered 2021-11-22: 100 mg via INTRAVENOUS
  Filled 2021-11-22: qty 10

## 2021-11-22 MED ORDER — SODIUM CHLORIDE 0.9 % IV SOLN
2575.0000 mg/m2 | INTRAVENOUS | Status: DC
  Administered 2021-11-22: 5000 mg via INTRAVENOUS
  Filled 2021-11-22: qty 100

## 2021-11-22 MED ORDER — SODIUM CHLORIDE 0.9 % IV SOLN
10.0000 mg | Freq: Once | INTRAVENOUS | Status: AC
  Administered 2021-11-22: 10 mg via INTRAVENOUS
  Filled 2021-11-22: qty 10

## 2021-11-22 MED ORDER — OXALIPLATIN CHEMO INJECTION 100 MG/20ML
85.0000 mg/m2 | Freq: Once | INTRAVENOUS | Status: AC
  Administered 2021-11-22: 165 mg via INTRAVENOUS
  Filled 2021-11-22: qty 33

## 2021-11-22 MED ORDER — LEUCOVORIN CALCIUM INJECTION 350 MG
200.0000 mg/m2 | Freq: Once | INTRAVENOUS | Status: AC
  Administered 2021-11-22: 390 mg via INTRAVENOUS
  Filled 2021-11-22: qty 19.5

## 2021-11-22 MED ORDER — PALONOSETRON HCL INJECTION 0.25 MG/5ML
0.2500 mg | Freq: Once | INTRAVENOUS | Status: AC
  Administered 2021-11-22: 0.25 mg via INTRAVENOUS
  Filled 2021-11-22: qty 5

## 2021-11-22 NOTE — Research (Signed)
[employer] 2021-05 - Specimen Collection Study to Evaluate Biomarkers in Subjects with Cancer   Medical History:  High Blood Pressure  No Coronary Artery Disease No Lupus    No Rheumatoid Arthritis  No Diabetes   No      Lynch Syndrome  No  Is the patient currently taking a magnesium supplement?   No  Does the patient have a personal history of cancer (greater than 5 years ago)?  No  Does the patient have a family history of cancer in 1st or 2nd degree relatives? No  Does the patient have history of alcohol consumption? No    Does the patient have history of cigarette, cigar, pipe, or chewing tobacco use?  No   This Nurse has reviewed this patient's inclusion and exclusion criteria and confirmed Jonathan Maynard is eligible for study participation.  Patient will continue with enrollment.  Eligibility confirmed by treating investigator, who also agrees that patient should proceed with enrollment.  Marjie Skiff Davione Lenker, RN, BSN, [facility]  Her  [facility] Clinical Research Nurse [facility] Direct Dial [phone]  Pager [phone] 11/22/2021 9:45 AM

## 2021-11-22 NOTE — Research (Signed)
Trial Name:  [employer] 2021-05 - Specimen Collection Study to Evaluate Biomarkers in Subjects with Cancer   Patient Jonathan Maynard was identified by Dr Burr Medico as a potential candidate for the above listed study.  This Clinical Research Nurse met with Ubaldo Glassing, [id number] on 11/22/21 in a manner and location that ensuresNurse patient privacy to discuss participation in the above listed research study.  Patient is Accompanied by his wife .  Patient was previously provided with informed consent documents.  Patient confirmed they have read the informed consent documents.  As outlined in the informed consent form, this Nurse and Ubaldo Glassing discussed the purpose of the research study, the investigational nature of the study, study procedures and requirements for study participation, potential risks and benefits of study participation, as well as alternatives to participation.  This study is not blinded or double-blinded. The patient understands participation is voluntary and they may withdraw from study participation at any time.  This study does not involve randomization.  This study does not involve an investigational drug or device. This study does not involve a placebo. Patient understands enrollment is pending full eligibility review.   Confidentiality and how the patient's information will be used as part of study participation were discussed.  Patient was informed there is reimbursement provided for their time and effort spent on trial participation.  The patient is encouraged to discuss research study participation with their insurance provider to determine what costs they may incur as part of study participation, including research related injury.    All questions were answered to patient's satisfaction.  The informed consent with embedded HIPAA language was reviewed page by page.  The patient's mental and emotional status is appropriate to provide informed  consent, and the patient verbalizes an understanding of study participation.  Patient has agreed to participate in the above listed research study and has voluntarily signed the informed consent version dated 30Jan2023 with embedded HIPAA language, version date 30Jan2023  on 11/22/21 at 0920AM.  The patient was provided with a copy of the signed informed consent form with embedded HIPAA language for their reference.  No study specific procedures were obtained prior to the signing of the informed consent document.  Approximately 10 minutes were spent with the patient reviewing the informed consent documents.  Patient was not requested to complete a Release of Information form.  Marjie Skiff Raymona Boss, RN, BSN, [facility]  Her  [facility] Clinical Research Nurse [facility] Direct Dial [phone]  Pager [phone] 11/22/2021 9:46 AM

## 2021-11-22 NOTE — Progress Notes (Signed)
[place]   Telephone:(336) [phone] Fax:(336) [phone]   Clinic Follow up Note   Patient Care Team: Pcp, No as PCP - General Truitt Merle, MD as Consulting Physician (Hematology and Oncology)  Date of Service:  11/22/2021  CHIEF COMPLAINT: f/u of gastric cancer  CURRENT THERAPY:  Neoadjuvant FLOT, q14d, starting 11/22/21  ASSESSMENT:  Jonathan Maynard is a 51 y.o. male with   1. Gastric Cancer, stage III, cT3N3M0, stage III, MMR normal -diagnosed 10/18/21 by EGD for 6 month f/u of gastric ulcers. Procedure showed mucosal intramucosal adenocarcinoma. MMR normal. -baseline CEA 11/01/21 significantly elevated at 1,989.58. -he met Dr. Kieth Brightly on 11/05/21. -EUS on 11/06/21 by Dr. Paulita Fujita, staged as T3 N3 (at least stage III) -staging CT CAP 11/08/21 showed: stable gastric antrum wall thickening; increased size of upper abdominal lymph nodes; stable left adrenal nodule, 1.9 cm. -port placed 11/15/21 -he is scheduled to begin neoadjuvant FLOT today, 11/17. Labs reviewed, hgb is slowly improving to 9.1. -he is scheduled for PET scan 11/27/21   2. Anemia of iron deficiency, h/o GI bleeding -hospitalized in 04/2021 for GI bleed secondary to gastric ulcers from NSAID use and H. Pylori. He received one dose IV ferric gluconate during admission. -he was prescribed oral ferrous sulfate, discontinued mid 10/2021 due to concerns for LE edema. Restarted after consultation on 11/01/21. -labs on 10/27 consistent with iron deficiency-- iron 25, ferritin 6, hgb 8.5. -he received IV Venofer on 11/11 and is scheduled for 11/18 and 11/25.     Plan: -proceed with first FLOT today as scheduled -pump d/c and IV Venofer 11/18 -lab, flush, f/u, and FLOT in 2, 4, and 6 weeks -Urgent dietitian consult   SUMMARY OF ONCOLOGIC HISTORY: Oncology History Overview Note   Cancer Staging  Gastric cancer [facility]) Staging form: Stomach, AJCC 8th Edition - Clinical stage from 10/18/2021:  Stage III (cT3, cN3a, cM0) - Signed by Truitt Merle, MD on 11/10/2021     Gastric cancer ([place])  10/18/2021 Procedure   EGD performed under the care of Dr. Michail Sermon  Findings:  -A large, ulcerated, partially circumferential (involving two thirds of the lumen circumference) mass with no bleeding and stigmata of recent bleeding was found in the gastric antrum, at the pylorus and in the prepyloric region of the stomach. -Segmental severe inflammation characterized by congestion (edema) and erythema was found in the gastric antrum.   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 Negative for dysplasia.  Negative for helicobacter pylori  Mismatch Repair (MMR) Protein Imunohistochemistry (IHC): IHC Expression Result: MLH1: Preserved nuclear expression. MSH2: Preserved nuclear expression. MSH6: Preserved nuclear expression. PMS2: Preserved nuclear expression. Interpretation: NORMAL   10/31/2021 Initial Diagnosis   Gastric cancer ([place])   11/01/2021 Tumor Marker   CEA = 4,540.98 (^)   11/06/2021 Procedure   Upper EUS, Dr. Paulita Fujita  Impression: - Normal esophagus. - A small amount of food (residue) in the stomach. - Congested, friable (with contact bleeding) and ulcerated mucosa in the prepyloric region of the stomach. - Normal examined duodenum. - There was no evidence of significant pathology in the left lobe of the liver. - Many abnormal  lymph nodes (over 10) were visualized in the gastrohepatic ligament (level 18), celiac region (level 20), perigastric region,  peripancreatic region and aortocaval region. - Wall thickening was seen in the prepyloric region of the stomach. The thickening appeared to be primarily within the deep mucosa (Layer 2) but extended through the muscularis propria. - Overall constellation of findings consistent with at least T3 N3 Mx (at least stage III) gastric adenocarcinoma.   11/08/2021 Imaging   EXAM: CT CHEST, ABDOMEN, AND PELVIS WITH CONTRAST  IMPRESSION: 1. Similar irregular wall thickening of the gastric antrum, consistent with patient's known primary gastric neoplasm. 2. Increased size of the upper abdominal lymph nodes, concerning for worsening nodal disease involvement. 3. Stable left adrenal nodule common nonspecific possibly a metastatic lesion or adenoma. 4. No evidence of metastatic disease in the chest. 5. Left-sided colonic diverticulosis without findings of acute diverticulitis. 6. Enlarged prostate gland. 7.  Aortic Atherosclerosis (ICD10-I70.0).   11/22/2021 -  Chemotherapy   Patient is on Treatment Plan : GASTROESOPHAGEAL FLOT q14d X 4 cycles        INTERVAL HISTORY:  Jonathan Maynard is here for a follow up of gastric cancer. He was last seen by me on 11/11/21. He presents to the clinic accompanied by his wife. He reports he is doing well overall. He notes he felt better with IV iron. He denies any pain.   All other systems were reviewed with the patient and are negative.  MEDICAL HISTORY:  Past Medical History:  Diagnosis Date   Acute upper GI bleed 04/12/2021   Class 1 obesity 04/12/2021   Diverticulosis 04/12/2021   Hepatic steatosis 04/12/2021    SURGICAL HISTORY: Past Surgical History:  Procedure Laterality Date   BIOPSY  04/12/2021   Procedure: BIOPSY;  Surgeon: Wilford Corner, MD;  Location: [facility] ENDOSCOPY;  Service: Gastroenterology;;   ESOPHAGOGASTRODUODENOSCOPY N/A 04/12/2021   Procedure: ESOPHAGOGASTRODUODENOSCOPY (EGD);  Surgeon: Wilford Corner, MD;  Location: [facility]OS[facility];  Service: Gastroenterology;  Laterality: N/A;   ESOPHAGOGASTRODUODENOSCOPY N/A 11/06/2021   Procedure: ESOPHAGOGASTRODUODENOSCOPY (EGD);  Surgeon: Arta Silence, MD;  Location: [facility];  Service: Gastroenterology;  Laterality: N/A;   HERNIA REPAIR     IR IMAGING GUIDED PORT INSERTION  11/15/2021   UPPER ESOPHAGEAL ENDOSCOPIC ULTRASOUND (EUS) Bilateral 11/06/2021   Procedure: UPPER ESOPHAGEAL ENDOSCOPIC ULTRASOUND (EUS);  Surgeon: Arta Silence, MD;  Location: [facility];  Service: Gastroenterology;  Laterality: Bilateral;    I have reviewed the social history and family history with the patient and they are unchanged from previous note.  ALLERGIES:  has No Known Allergies.  MEDICATIONS:  Current Outpatient Medications  Medication Sig Dispense Refill   dexamethasone (DECADRON) 4 MG tablet Take 1 tablet (4 mg total) by mouth daily. Take 1 tab twice the day before chemo, then once daily for 2 days after chemo 30 tablet 1   ferrous sulfate 325 (65 FE) MG tablet Take 1 tablet (325 mg total) by mouth daily. 30 tablet 3   lidocaine-prilocaine (EMLA) cream Apply to affected area as directed once 30 g 3   ondansetron (ZOFRAN) 8 MG tablet Take 1 tablet (8 mg total) by mouth every 8 (eight) hours as needed for nausea or vomiting. Start on the third day after chemotherapy. 30 tablet 1   pantoprazole (PROTONIX) 40 MG tablet Take 1 tablet (40 mg total) by mouth 2 (two) times daily. 60 tablet 3   prochlorperazine (COMPAZINE) 10 MG tablet Take 1 tablet (10 mg total) by mouth every 6 (six) hours  as needed for nausea or vomiting. 30 tablet 1   No current facility-administered medications for this visit.   Facility-Administered Medications Ordered in Other Visits  Medication Dose Route Frequency Provider Last Rate Last Admin   fluorouracil (ADRUCIL) 5,000 mg in sodium chloride 0.9 % 150 mL chemo infusion  2,575 mg/m2 (Treatment Plan Recorded) Intravenous 1 day or 1 dose Truitt Merle, MD    Infusion Verify at 11/22/21 1639    PHYSICAL EXAMINATION: ECOG PERFORMANCE STATUS: 1 - Symptomatic but completely ambulatory  Vitals:   11/22/21 1014  BP: 118/64  Pulse: 68  Resp: 15  Temp: 98.6 F (37 C)  SpO2: 100%   Wt Readings from Last 3 Encounters:  11/22/21 178 lb 12.8 oz (81.1 kg)  11/11/21 182 lb 6.4 oz (82.7 kg)  11/06/21 182 lb 8.7 oz (82.8 kg)     GENERAL:alert, no distress and comfortable SKIN: skin color normal, no rashes or significant lesions EYES: normal, Conjunctiva are pink and non-injected, sclera clear  NEURO: alert & oriented x 3 with fluent speech  LABORATORY DATA:  I have reviewed the data as listed    Latest Ref Rng & Units 11/22/2021    9:50 AM 11/01/2021    3:12 PM 04/16/2021    5:33 AM  CBC  WBC 4.0 - 10.5 K/uL 6.0  6.4  8.6   Hemoglobin 13.0 - 17.0 g/dL 9.1  8.5  8.3   Hematocrit 39.0 - 52.0 % 27.8  25.7  25.5   Platelets 150 - 400 K/uL 230  230  321         Latest Ref Rng & Units 11/22/2021    9:50 AM 11/01/2021    3:12 PM 04/16/2021    5:33 AM  CMP  Glucose 70 - 99 mg/dL 92  105  115   BUN 6 - 20 mg/dL _0 Creatinine 0.61 - 1.24 mg/dL 0.71  1.03  0.96   Sodium 135 - 145 mmol/L 138  141  135   Potassium 3.5 - 5.1 mmol/L 3.9  4.2  4.4   Chloride 98 - 111 mmol/L 108  110  103   CO2 22 - 32 mmol/L _1 Calcium 8.9 - 10.3 mg/dL 8.9  8.7  8.9   Total Protein 6.5 - 8.1 g/dL 7.2  6.9    Total Bilirubin 0.3 - 1.2 mg/dL 0.4  0.3    Alkaline Phos 38 - 126 U/L 73  77    AST 15 - 41 U/L 15  13    ALT 0 - 44 U/L 7  8        RADIOGRAPHIC STUDIES: I have personally reviewed the radiological images as listed and agreed with the findings in the report. No results found.    Orders Placed This Encounter  Procedures   CBC with Differential ([place] Only)    Standing Status:   Future    Standing Expiration Date:   12/21/2022   CMP ([place] only)    Standing Status:   Future    Standing Expiration Date:    12/21/2022   CBC with Differential ([place] Only)    Standing Status:   Future    Standing Expiration Date:   01/04/2023   CMP ([place] only)    Standing Status:   Future    Standing Expiration Date:   01/04/2023   Ambulatory Referral to [facility] Nutrition    Referral Priority:  Urgent    Referral Type:   Consultation    Referral Reason:   Specialty Services Required    Number of Visits Requested:   1   All questions were answered. The patient knows to call the clinic with any problems, questions or concerns. No barriers to learning was detected. The total time spent in the appointment was 30 minutes.     Truitt Merle, MD 11/22/2021   I, Wilburn Mylar, am acting as scribe for Truitt Merle, MD.   I have reviewed the above documentation for accuracy and completeness, and I agree with the above.

## 2021-11-22 NOTE — Progress Notes (Signed)
Per Dr. Burr Medico, "OK To Run 5FU continuous infusion over 22hrs this cycle".  Disregard the previous note from Dr. Burr Medico to run 5FU over 23hrs.

## 2021-11-22 NOTE — Patient Instructions (Signed)
[facility] ONCOLOGY  Discharge Instructions: Thank you for choosing [facility] to provide your oncology and hematology care.   If you have a lab appointment with the [facility], please go directly to the [facility] and check in at the registration area.   Wear comfortable clothing and clothing appropriate for easy access to any Portacath or PICC line.   We strive to give you quality time with your provider. You may need to reschedule your appointment if you arrive late (15 or more minutes).  Arriving late affects you and other patients whose appointments are after yours.  Also, if you miss three or more appointments without notifying the office, you may be dismissed from the clinic at the provider's discretion.      For prescription refill requests, have your pharmacy contact our office and allow 72 hours for refills to be completed.    Today you received the following chemotherapy and/or immunotherapy agents; Docetaxel, Carboplatin, Leucovorin, & Fluorouracil      To help prevent nausea and vomiting after your treatment, we encourage you to take your nausea medication as directed.  BELOW ARE SYMPTOMS THAT SHOULD BE REPORTED IMMEDIATELY: *FEVER GREATER THAN 100.4 F (38 C) OR HIGHER *CHILLS OR SWEATING *NAUSEA AND VOMITING THAT IS NOT CONTROLLED WITH YOUR NAUSEA MEDICATION *UNUSUAL SHORTNESS OF BREATH *UNUSUAL BRUISING OR BLEEDING *URINARY PROBLEMS (pain or burning when urinating, or frequent urination) *BOWEL PROBLEMS (unusual diarrhea, constipation, pain near the anus) TENDERNESS IN MOUTH AND THROAT WITH OR WITHOUT PRESENCE OF ULCERS (sore throat, sores in mouth, or a toothache) UNUSUAL RASH, SWELLING OR PAIN  UNUSUAL VAGINAL DISCHARGE OR ITCHING   Items with * indicate a potential emergency and should be followed up as soon as possible or go to the Emergency Department if any problems should occur.  Please show the CHEMOTHERAPY ALERT CARD or  IMMUNOTHERAPY ALERT CARD at check-in to the Emergency Department and triage nurse.  Should you have questions after your visit or need to cancel or reschedule your appointment, please contact [facility]  Dept: [phone]  and follow the prompts.  Office hours are 8:00 a.m. to 4:30 p.m. Monday - Friday. Please note that voicemails left after 4:00 p.m. may not be returned until the following business day.  We are closed weekends and major holidays. You have access to a nurse at all times for urgent questions. Please call the main number to the clinic Dept: [phone] and follow the prompts.   For any non-urgent questions, you may also contact your provider using MyChart. We now offer e-Visits for anyone 86 and older to request care online for non-urgent symptoms. For details visit [email or web address].   Also download the MyChart app! Go to the app store, search "MyChart", open the app, select [facility], and log in with your MyChart username and password.  Masks are optional in the cancer centers. If you would like for your care team to wear a mask while they are taking care of you, please let them know. You may have one support Sameen Leas who is at least 50 years old accompany you for your appointments.

## 2021-11-22 NOTE — Research (Signed)
[employer] 2021-05 - Specimen Collection Study to Evaluate Biomarkers in Subjects with Cancer   SECOND ELIGIBILITY CHECK  This Nurse has reviewed this patient's inclusion and exclusion criteria as a second review and confirms Jonathan Maynard is eligible for study participation.  Patient may continue with enrollment.  Wells Guiles 'Learta CoddingNeysa Bonito, RN, BSN Clinical Research Nurse I 11/22/21 9:46 AM

## 2021-11-23 ENCOUNTER — Other Ambulatory Visit: Payer: Self-pay

## 2021-11-23 ENCOUNTER — Inpatient Hospital Stay: Payer: BC Managed Care – PPO

## 2021-11-23 VITALS — BP 100/60 | HR 68 | Temp 98.7°F | Resp 18

## 2021-11-23 DIAGNOSIS — D500 Iron deficiency anemia secondary to blood loss (chronic): Secondary | ICD-10-CM

## 2021-11-23 DIAGNOSIS — Z5111 Encounter for antineoplastic chemotherapy: Secondary | ICD-10-CM | POA: Diagnosis not present

## 2021-11-23 MED ORDER — LORATADINE 10 MG PO TABS
10.0000 mg | ORAL_TABLET | Freq: Once | ORAL | Status: AC
  Administered 2021-11-23: 10 mg via ORAL
  Filled 2021-11-23: qty 1

## 2021-11-23 MED ORDER — SODIUM CHLORIDE 0.9 % IV SOLN: Freq: Once | INTRAVENOUS | Status: AC

## 2021-11-23 MED ORDER — SODIUM CHLORIDE 0.9% FLUSH
10.0000 mL | Freq: Once | INTRAVENOUS | Status: AC | PRN
  Administered 2021-11-23: 10 mL

## 2021-11-23 MED ORDER — SODIUM CHLORIDE 0.9 % IV SOLN
300.0000 mg | Freq: Once | INTRAVENOUS | Status: AC
  Administered 2021-11-23: 300 mg via INTRAVENOUS
  Filled 2021-11-23: qty 300

## 2021-11-23 MED ORDER — HEPARIN SOD (PORK) LOCK FLUSH 100 UNIT/ML IV SOLN
500.0000 [IU] | Freq: Once | INTRAVENOUS | Status: AC | PRN
  Administered 2021-11-23: 500 [IU]

## 2021-11-23 NOTE — Progress Notes (Signed)
Pt observed for 30 minutes post Venofer infusion. Pt tolerated trtmt well w/out incident. VSS at discharge.  Ambulatory to lobby.

## 2021-11-23 NOTE — Patient Instructions (Signed)

## 2021-11-25 ENCOUNTER — Inpatient Hospital Stay: Payer: BC Managed Care – PPO

## 2021-11-25 ENCOUNTER — Encounter: Payer: Self-pay | Admitting: Hematology

## 2021-11-25 ENCOUNTER — Other Ambulatory Visit (HOSPITAL_COMMUNITY): Payer: Self-pay

## 2021-11-25 ENCOUNTER — Other Ambulatory Visit: Payer: Self-pay

## 2021-11-25 ENCOUNTER — Encounter: Payer: Self-pay | Admitting: Physician Assistant

## 2021-11-25 VITALS — BP 136/79 | HR 70 | Resp 18

## 2021-11-25 DIAGNOSIS — Z5111 Encounter for antineoplastic chemotherapy: Secondary | ICD-10-CM | POA: Diagnosis not present

## 2021-11-25 DIAGNOSIS — C163 Malignant neoplasm of pyloric antrum: Secondary | ICD-10-CM

## 2021-11-25 MED ORDER — PEGFILGRASTIM-JMDB 6 MG/0.6ML ~~LOC~~ SOSY
6.0000 mg | PREFILLED_SYRINGE | Freq: Once | SUBCUTANEOUS | Status: AC
  Administered 2021-11-25: 6 mg via SUBCUTANEOUS
  Filled 2021-11-25: qty 0.6

## 2021-11-25 NOTE — Patient Instructions (Signed)

## 2021-11-25 NOTE — Progress Notes (Signed)
Nutrition Assessment   Reason for Assessment:  Referral from Dr Burr Medico    ASSESSMENT:  51 year old male with gastric cancer, stage III.  Past medical history of gastric ulcers, GI bleeding, hepatic steatosis, anemia.  Planning to start FLOT.    Spoke with patient via phone and met him in person as coming into cancer center.  Patient reports that appetite is good, maybe eating less than before.  Says that he can tolerate any food that he wants to eat.  Denies stomach upset.  Usually eats french toast sticks for breakfast, lunch is sandwich and dinner is meat and couple of sides.  Denies any nutrition impact symptoms at this time.      Medications: dexamethasone, Fe sulfate, zofran, protonix, compazine   Labs: reviewed   Anthropometrics:   Height: 65 inches Weight: 178 lb 12.8 oz on 11/17 UBW: 200 lb in April 2023 BMI: 29  11% weight loss in the last 7 months   Estimated Energy Needs  Kcals: 2025-2400 Protein: 101-120 g Fluid: 2025-2400 ml   NUTRITION DIAGNOSIS: Unintentional weight related to cancer diagnosis, Gastric ulcers, anemia as evidenced by 11% weight loss in the last 7 months and eating less volume of foods   INTERVENTION:  Discussed importance of nutrition during treatment.  Encouraged good sources of protein and adequate calories to maintain weight Discussed options of oral nutrition supplements Provided handout on high calorie, high protein diet.   Contact information provided   MONITORING, EVALUATION, GOAL: weight trends, intake   Next Visit: Friday, Dec 15 during infusion  Iliyana Convey B. Zenia Resides, [place], [place] Registered Dietitian [phone]

## 2021-11-27 ENCOUNTER — Encounter (HOSPITAL_COMMUNITY)
Admission: RE | Admit: 2021-11-27 | Discharge: 2021-11-27 | Disposition: A | Discharge status: Home / Self Care | Payer: BC Managed Care – PPO | Source: Ambulatory Visit | Attending: Hematology | Admitting: Hematology

## 2021-11-27 DIAGNOSIS — C169 Malignant neoplasm of stomach, unspecified: Secondary | ICD-10-CM | POA: Diagnosis present

## 2021-11-27 DIAGNOSIS — C163 Malignant neoplasm of pyloric antrum: Secondary | ICD-10-CM | POA: Diagnosis present

## 2021-11-27 LAB — GLUCOSE, CAPILLARY: Glucose-Capillary: 96 mg/dL (ref 70–99)

## 2021-11-27 MED ORDER — FLUDEOXYGLUCOSE F - 18 (FDG) INJECTION
9.5900 | Freq: Once | INTRAVENOUS | Status: AC | PRN
  Administered 2021-11-27: 9.59 via INTRAVENOUS

## 2021-11-29 MED FILL — Iron Sucrose Inj 20 MG/ML (Fe Equiv): INTRAVENOUS | Qty: 15 | Status: AC

## 2021-11-30 ENCOUNTER — Other Ambulatory Visit: Payer: Self-pay

## 2021-11-30 ENCOUNTER — Inpatient Hospital Stay: Payer: BC Managed Care – PPO

## 2021-11-30 VITALS — BP 121/72 | HR 69 | Temp 98.1°F | Resp 16

## 2021-11-30 DIAGNOSIS — Z5111 Encounter for antineoplastic chemotherapy: Secondary | ICD-10-CM | POA: Diagnosis not present

## 2021-11-30 DIAGNOSIS — D500 Iron deficiency anemia secondary to blood loss (chronic): Secondary | ICD-10-CM

## 2021-11-30 MED ORDER — HEPARIN SOD (PORK) LOCK FLUSH 100 UNIT/ML IV SOLN
500.0000 [IU] | Freq: Once | INTRAVENOUS | Status: AC | PRN
  Administered 2021-11-30: 500 [IU]

## 2021-11-30 MED ORDER — SODIUM CHLORIDE 0.9 % IV SOLN
300.0000 mg | Freq: Once | INTRAVENOUS | Status: AC
  Administered 2021-11-30: 300 mg via INTRAVENOUS
  Filled 2021-11-30: qty 300

## 2021-11-30 MED ORDER — SODIUM CHLORIDE 0.9% FLUSH
10.0000 mL | Freq: Once | INTRAVENOUS | Status: AC | PRN
  Administered 2021-11-30: 10 mL

## 2021-11-30 MED ORDER — SODIUM CHLORIDE 0.9 % IV SOLN: Freq: Once | INTRAVENOUS | Status: AC

## 2021-11-30 MED ORDER — LORATADINE 10 MG PO TABS: 10.0000 mg | ORAL_TABLET | Freq: Once | ORAL | Status: DC

## 2021-11-30 NOTE — Patient Instructions (Signed)

## 2021-12-03 ENCOUNTER — Encounter: Payer: Self-pay | Admitting: Hematology

## 2021-12-04 ENCOUNTER — Encounter: Payer: Self-pay | Admitting: Hematology

## 2021-12-05 ENCOUNTER — Inpatient Hospital Stay: Payer: BC Managed Care – PPO

## 2021-12-05 MED FILL — Dexamethasone Sodium Phosphate Inj 100 MG/10ML: INTRAMUSCULAR | Qty: 1 | Status: AC

## 2021-12-05 NOTE — Assessment & Plan Note (Signed)
-  received iv Venofer in 11/2021 -on oral iron

## 2021-12-05 NOTE — Progress Notes (Signed)
Jonathan Maynard   [phone] Fax:([phone]   Clinic Follow up Note   Patient Care Team: Pcp, No as PCP - General Truitt Merle, MD as Consulting Physician (Hematology and Oncology)  Date of Service:  12/06/2021  CHIEF COMPLAINT: f/u of gastric cancer   CURRENT THERAPY:  Neoadjuvant FLOT, q14d, starting 11/22/21   ASSESSMENT:  Jonathan Maynard is a 51 y.o. 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 Procedure showed mucosal intramucosal adenocarcinoma. MMR normal. -baseline CEA 11/01/21 significantly elevated at 1,989.58. -he met Dr. Kieth Brightly on 11/05/21. -EUS on 11/06/21 by Dr. Paulita Fujita, staged as T3 N3 (at least stage III) -staging CT CAP 11/08/21 showed: stable gastric antrum wall thickening; increased size of upper abdominal lymph nodes; stable left adrenal nodule, 1.9 cm. -port placed 11/15/21 -he started neoadjuvant FLOT on 11/17.  -PET scan showed FDG avid lymph nodes within the gastrohepatic ligament, portacaval region, and mesentery. Tracer avid left supraclavicular lymph node and indeterminate left adrenal gland nodule with mild FDG uptake.   Iron deficiency anemia due to chronic blood loss -received iv Venofer in 11/2021 -on oral iron     PLAN: -Lab reviewed -normal -Reviewed PET, I discussed his PET scan in tumor board, to see if we need to dedicate CT scan or left adrenal mass biopsy to rule out distant metastasis. -proceed C2 FLOT adequate for treatment same dose -lab,flush,f/u and FLOT in 2wks  SUMMARY OF ONCOLOGIC HISTORY: Oncology History Overview Note   Cancer Staging  Gastric cancer [facility]) Staging form: Stomach, AJCC 8th Edition - Clinical stage from 10/18/2021: Stage III (cT3, cN3a, cM0) - Signed by Truitt Merle, MD on 11/10/2021     Gastric cancer (Jonathan Maynard)  10/18/2021 Procedure   EGD performed under the care of Dr.  Michail Sermon  Findings:  -A large, ulcerated, partially circumferential (involving two thirds of the lumen circumference) mass with no bleeding and stigmata of recent bleeding was found in the gastric antrum, at the pylorus and in the prepyloric region of the stomach. -Segmental severe inflammation characterized by congestion (edema) and erythema was found in the gastric antrum.   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 Negative for dysplasia.  Negative for helicobacter pylori  Mismatch Repair (MMR) Protein Imunohistochemistry (IHC): IHC Expression Result: MLH1: Preserved nuclear expression. MSH2: Preserved nuclear expression. MSH6: Preserved nuclear expression. PMS2: Preserved nuclear expression. Interpretation: NORMAL   10/31/2021 Initial Diagnosis   Gastric cancer ([place])   11/01/2021 Tumor Marker   CEA = 5,038.88 (^)   11/06/2021 Procedure   Upper EUS, Dr. Paulita Fujita  Impression: - Normal esophagus. - A small amount of food (residue) in the stomach. - Congested, friable (with contact bleeding) and ulcerated mucosa in the prepyloric region of the stomach. - Normal examined duodenum. - There was no evidence of significant pathology in the left lobe of the liver. - Many abnormal lymph nodes (over 10) were visualized in the gastrohepatic ligament (level 18), celiac region (level 20), perigastric region, peripancreatic region and aortocaval region. - Wall thickening was seen in the  prepyloric region of the stomach. The thickening appeared to be primarily within the  deep mucosa (Layer 2) but extended through the muscularis propria. - Overall constellation of findings consistent with at least T3 N3 Mx (at least stage III) gastric adenocarcinoma.   11/08/2021 Imaging   EXAM: CT CHEST, ABDOMEN, AND PELVIS WITH CONTRAST  IMPRESSION: 1. Similar irregular wall thickening of the gastric antrum, consistent with patient's known primary gastric neoplasm. 2. Increased size of the upper abdominal lymph nodes, concerning for worsening nodal disease involvement. 3. Stable left adrenal nodule common nonspecific possibly a metastatic lesion or adenoma. 4. No evidence of metastatic disease in the chest. 5. Left-sided colonic diverticulosis without findings of acute diverticulitis. 6. Enlarged prostate gland. 7.  Aortic Atherosclerosis (ICD10-I70.0).   11/22/2021 -  Chemotherapy   Patient is on Treatment Plan : GASTROESOPHAGEAL FLOT q14d X 4 cycles        INTERVAL HISTORY:  Jonathan Maynard is here for a follow up of  gastric cancer He was last seen by me on 11/22/2021 He presents to the clinic accompanied with family member. Pt states treatment went well. Pt reports nose swelling and is recommended Flonase OTC. patient denies any significant nausea, vomiting, diarrhea from chemotherapy.  He is eating well, still works full-time.   All other systems were reviewed with the patient and are negative.  MEDICAL HISTORY:  Past Medical History:  Diagnosis Date   Acute upper GI bleed 04/12/2021   Class 1 obesity 04/12/2021   Diverticulosis 04/12/2021   Hepatic steatosis 04/12/2021    SURGICAL HISTORY: Past Surgical History:  Procedure Laterality Date   BIOPSY  04/12/2021   Procedure: BIOPSY;  Surgeon: Wilford Corner, MD;  Location: [facility];  Service: Gastroenterology;;   ESOPHAGOGASTRODUODENOSCOPY N/A 04/12/2021   Procedure: ESOPHAGOGASTRODUODENOSCOPY (EGD);  Surgeon: Wilford Corner, MD;  Location: [facility];  Service: Gastroenterology;   Laterality: N/A;   ESOPHAGOGASTRODUODENOSCOPY N/A 11/06/2021   Procedure: ESOPHAGOGASTRODUODENOSCOPY (EGD);  Surgeon: Arta Silence, MD;  Location: [facility];  Service: Gastroenterology;  Laterality: N/A;   HERNIA REPAIR     IR IMAGING GUIDED PORT INSERTION  11/15/2021   UPPER ESOPHAGEAL ENDOSCOPIC ULTRASOUND (EUS) Bilateral 11/06/2021   Procedure: UPPER ESOPHAGEAL ENDOSCOPIC ULTRASOUND (EUS);  Surgeon: Arta Silence, MD;  Location: [facility];  Service: Gastroenterology;  Laterality: Bilateral;    I have reviewed the social history and family history with the patient and they are unchanged from previous note.  ALLERGIES:  has No Known Allergies.  MEDICATIONS:  Current Outpatient Medications  Medication Sig Dispense Refill   dexamethasone (DECADRON) 4 MG tablet Take 1 tablet (4 mg total) by mouth daily. Take 1 tab twice the day before chemo, then once daily for 2 days after chemo 30 tablet 1   ferrous sulfate 325 (65 FE) MG tablet Take 1 tablet (325 mg total) by mouth daily. 30 tablet 3   lidocaine-prilocaine (EMLA) cream Apply to affected area as directed once 30 g 3   ondansetron (ZOFRAN) 8 MG tablet Take 1 tablet (8 mg total) by mouth every 8 (eight) hours as needed for nausea or vomiting. Start on the third day after chemotherapy. 30 tablet 1   pantoprazole (PROTONIX) 40 MG tablet Take 1 tablet (40 mg total) by mouth 2 (two) times daily. 60 tablet 3   prochlorperazine (COMPAZINE) 10 MG tablet Take 1 tablet (10 mg total) by mouth every 6 (six) hours as needed for nausea or vomiting. 30 tablet 1   No current facility-administered medications for this  visit.    PHYSICAL EXAMINATION: ECOG PERFORMANCE STATUS: 0 - Asymptomatic  Vitals:   12/06/21 0815  BP: 111/66  Pulse: 79  Resp: 15  Temp: 99 F (37.2 C)  SpO2: 100%   Wt Readings from Last 3 Encounters:  12/06/21 179 lb 3.2 oz (81.3 kg)  11/22/21 178 lb 12.8 oz (81.1 kg)  11/11/21 182 lb 6.4 oz (82.7 kg)      GENERAL:alert, no distress and comfortable SKIN: skin color normal, no rashes or significant lesions EYES: normal, Conjunctiva are pink and non-injected, sclera clear  NEURO: alert & oriented x 3 with fluent speech  LABORATORY DATA:  I have reviewed the data as listed    Latest Ref Rng & Units 12/06/2021    7:45 AM 11/22/2021    9:50 AM 11/01/2021    3:12 PM  CBC  WBC 4.0 - 10.5 K/uL 10.6  6.0  6.4   Hemoglobin 13.0 - 17.0 g/dL 9.0  9.1  8.5   Hematocrit 39.0 - 52.0 % 27.0  27.8  25.7   Platelets 150 - 400 K/uL 138  230  230         Latest Ref Rng & Units 12/06/2021    7:45 AM 11/22/2021    9:50 AM 11/01/2021    3:12 PM  CMP  Glucose 70 - 99 mg/dL 121  92  105   BUN 6 - 20 mg/dL _0 Creatinine 0.61 - 1.24 mg/dL 0.73  0.71  1.03   Sodium 135 - 145 mmol/L 136  138  141   Potassium 3.5 - 5.1 mmol/L 3.7  3.9  4.2   Chloride 98 - 111 mmol/L 109  108  110   CO2 22 - 32 mmol/L _1 Calcium 8.9 - 10.3 mg/dL 8.6  8.9  8.7   Total Protein 6.5 - 8.1 g/dL 6.6  7.2  6.9   Total Bilirubin 0.3 - 1.2 mg/dL 0.3  0.4  0.3   Alkaline Phos 38 - 126 U/L 72  73  77   AST 15 - 41 U/L _2 ALT 0 - 44 U/L _3 RADIOGRAPHIC STUDIES: I have personally reviewed the radiological images as listed and agreed with the findings in the report. No results found.    Orders Placed This Encounter  Procedures   CBC with Differential ([facility] Only)    Standing Status:   Future    Standing Expiration Date:   01/18/2023   CMP ([facility] only)    Standing Status:   Future    Standing Expiration Date:   01/18/2023   All questions were answered. The patient knows to call the clinic with any problems, questions or concerns. No barriers to learning was detected. The total time spent in the appointment was 30 minutes.     Truitt Merle, MD 12/06/2021   Felicity Coyer, CMA, am acting as scribe for Truitt Merle, MD.   I have reviewed the above documentation for  accuracy and completeness, and I agree with the above.

## 2021-12-05 NOTE — Assessment & Plan Note (Signed)
stage III, [id number], stage III, MMR normal -diagnosed 10/18/21 by EGD for 6 month f/u of gastric ulcers. Procedure showed mucosal intramucosal adenocarcinoma. MMR normal. -baseline CEA 11/01/21 significantly elevated at 1,989.58. -he met Dr. Kieth Brightly on 11/05/21. -EUS on 11/06/21 by Dr. Paulita Fujita, staged as T3 N3 (at least stage III) -staging CT CAP 11/08/21 showed: stable gastric antrum wall thickening; increased size of upper abdominal lymph nodes; stable left adrenal nodule, 1.9 cm. -port placed 11/15/21 -he started neoadjuvant FLOT on 11/17.  -PET scan showed FDG avid lymph nodes within the gastrohepatic ligament, portacaval region, and mesentery. Tracer avid left supraclavicular lymph node and indeterminate left adrenal gland nodule with mild FDG uptake.

## 2021-12-06 ENCOUNTER — Inpatient Hospital Stay: Payer: BC Managed Care – PPO | Attending: Physician Assistant

## 2021-12-06 ENCOUNTER — Inpatient Hospital Stay: Payer: BC Managed Care – PPO

## 2021-12-06 ENCOUNTER — Encounter: Payer: Self-pay | Admitting: Hematology

## 2021-12-06 ENCOUNTER — Inpatient Hospital Stay (HOSPITAL_BASED_OUTPATIENT_CLINIC_OR_DEPARTMENT_OTHER): Payer: BC Managed Care – PPO | Admitting: Hematology

## 2021-12-06 VITALS — BP 111/66 | HR 79 | Temp 99.0°F | Resp 15 | Wt 179.2 lb

## 2021-12-06 DIAGNOSIS — D500 Iron deficiency anemia secondary to blood loss (chronic): Secondary | ICD-10-CM | POA: Insufficient documentation

## 2021-12-06 DIAGNOSIS — Z5189 Encounter for other specified aftercare: Secondary | ICD-10-CM | POA: Insufficient documentation

## 2021-12-06 DIAGNOSIS — Z95828 Presence of other vascular implants and grafts: Secondary | ICD-10-CM

## 2021-12-06 DIAGNOSIS — R970 Elevated carcinoembryonic antigen [CEA]: Secondary | ICD-10-CM | POA: Insufficient documentation

## 2021-12-06 DIAGNOSIS — Z8711 Personal history of peptic ulcer disease: Secondary | ICD-10-CM | POA: Diagnosis not present

## 2021-12-06 DIAGNOSIS — Z8619 Personal history of other infectious and parasitic diseases: Secondary | ICD-10-CM | POA: Insufficient documentation

## 2021-12-06 DIAGNOSIS — Z5111 Encounter for antineoplastic chemotherapy: Secondary | ICD-10-CM | POA: Insufficient documentation

## 2021-12-06 DIAGNOSIS — F17210 Nicotine dependence, cigarettes, uncomplicated: Secondary | ICD-10-CM | POA: Insufficient documentation

## 2021-12-06 DIAGNOSIS — C163 Malignant neoplasm of pyloric antrum: Secondary | ICD-10-CM

## 2021-12-06 DIAGNOSIS — K922 Gastrointestinal hemorrhage, unspecified: Secondary | ICD-10-CM | POA: Insufficient documentation

## 2021-12-06 DIAGNOSIS — R197 Diarrhea, unspecified: Secondary | ICD-10-CM | POA: Insufficient documentation

## 2021-12-06 LAB — CBC WITH DIFFERENTIAL (CANCER CENTER ONLY)
Abs Immature Granulocytes: 0.46 10*3/uL — ABNORMAL HIGH (ref 0.00–0.07)
Basophils Absolute: 0.1 10*3/uL (ref 0.0–0.1)
Basophils Relative: 1 %
Eosinophils Absolute: 0 10*3/uL (ref 0.0–0.5)
Eosinophils Relative: 0 %
HCT: 27 % — ABNORMAL LOW (ref 39.0–52.0)
Hemoglobin: 9 g/dL — ABNORMAL LOW (ref 13.0–17.0)
Immature Granulocytes: 4 %
Lymphocytes Relative: 21 %
Lymphs Abs: 2.2 10*3/uL (ref 0.7–4.0)
MCH: 30.4 pg (ref 26.0–34.0)
MCHC: 33.3 g/dL (ref 30.0–36.0)
MCV: 91.2 fL (ref 80.0–100.0)
Monocytes Absolute: 1 10*3/uL (ref 0.1–1.0)
Monocytes Relative: 10 %
Neutro Abs: 6.8 10*3/uL (ref 1.7–7.7)
Neutrophils Relative %: 64 %
Platelet Count: 138 10*3/uL — ABNORMAL LOW (ref 150–400)
RBC: 2.96 MIL/uL — ABNORMAL LOW (ref 4.22–5.81)
RDW: 16.7 % — ABNORMAL HIGH (ref 11.5–15.5)
WBC Count: 10.6 10*3/uL — ABNORMAL HIGH (ref 4.0–10.5)
nRBC: 0.3 % — ABNORMAL HIGH (ref 0.0–0.2)

## 2021-12-06 LAB — CMP (CANCER CENTER ONLY)
ALT: 9 U/L (ref 0–44)
AST: 12 U/L — ABNORMAL LOW (ref 15–41)
Albumin: 3.5 g/dL (ref 3.5–5.0)
Alkaline Phosphatase: 72 U/L (ref 38–126)
Anion gap: 4 — ABNORMAL LOW (ref 5–15)
BUN: 12 mg/dL (ref 6–20)
CO2: 23 mmol/L (ref 22–32)
Calcium: 8.6 mg/dL — ABNORMAL LOW (ref 8.9–10.3)
Chloride: 109 mmol/L (ref 98–111)
Creatinine: 0.73 mg/dL (ref 0.61–1.24)
GFR, Estimated: >60 mL/min (ref >60)
Glucose, Bld: 121 mg/dL — ABNORMAL HIGH (ref 70–99)
Potassium: 3.7 mmol/L (ref 3.5–5.1)
Sodium: 136 mmol/L (ref 135–145)
Total Bilirubin: 0.3 mg/dL (ref 0.3–1.2)
Total Protein: 6.6 g/dL (ref 6.5–8.1)

## 2021-12-06 MED ORDER — SODIUM CHLORIDE 0.9 % IV SOLN
50.0000 mg/m2 | Freq: Once | INTRAVENOUS | Status: AC
  Administered 2021-12-06: 100 mg via INTRAVENOUS
  Filled 2021-12-06: qty 10

## 2021-12-06 MED ORDER — SODIUM CHLORIDE 0.9% FLUSH: 10.0000 mL | INTRAVENOUS | Status: DC | PRN

## 2021-12-06 MED ORDER — SODIUM CHLORIDE 0.9% FLUSH
10.0000 mL | INTRAVENOUS | Status: AC | PRN
  Administered 2021-12-06: 10 mL

## 2021-12-06 MED ORDER — SODIUM CHLORIDE 0.9 % IV SOLN
10.0000 mg | Freq: Once | INTRAVENOUS | Status: AC
  Administered 2021-12-06: 10 mg via INTRAVENOUS
  Filled 2021-12-06: qty 10

## 2021-12-06 MED ORDER — DEXTROSE 5 % IV SOLN: Freq: Once | INTRAVENOUS | Status: AC

## 2021-12-06 MED ORDER — OXALIPLATIN CHEMO INJECTION 100 MG/20ML
85.0000 mg/m2 | Freq: Once | INTRAVENOUS | Status: AC
  Administered 2021-12-06: 165 mg via INTRAVENOUS
  Filled 2021-12-06: qty 33

## 2021-12-06 MED ORDER — SODIUM CHLORIDE 0.9 % IV SOLN
2575.0000 mg/m2 | INTRAVENOUS | Status: DC
  Administered 2021-12-06: 5000 mg via INTRAVENOUS
  Filled 2021-12-06: qty 100

## 2021-12-06 MED ORDER — PALONOSETRON HCL INJECTION 0.25 MG/5ML
0.2500 mg | Freq: Once | INTRAVENOUS | Status: AC
  Administered 2021-12-06: 0.25 mg via INTRAVENOUS
  Filled 2021-12-06: qty 5

## 2021-12-06 MED ORDER — LEUCOVORIN CALCIUM INJECTION 350 MG
200.0000 mg/m2 | Freq: Once | INTRAVENOUS | Status: AC
  Administered 2021-12-06: 390 mg via INTRAVENOUS
  Filled 2021-12-06: qty 19.5

## 2021-12-06 NOTE — Progress Notes (Signed)
Oxaliplatin diluted in D5W 542m, Exp: 10/06/2022, Lot:: [id number]  PRaul DelLNewry R[place] BCPS, BCOP 12/06/2021 10:19 AM

## 2021-12-06 NOTE — Patient Instructions (Signed)
[facility]  Discharge Instructions: Thank you for choosing [facility] to provide your oncology and hematology care.   If you have a lab appointment with the [facility], please go directly to the [facility] and check in at the registration area.   Wear comfortable clothing and clothing appropriate for easy access to any Portacath or PICC line.   We strive to give you quality time with your provider. You may need to reschedule your appointment if you arrive late (15 or more minutes).  Arriving late affects you and other patients whose appointments are after yours.  Also, if you miss three or more appointments without notifying the office, you may be dismissed from the clinic at the provider's discretion.      For prescription refill requests, have your pharmacy contact our office and allow 72 hours for refills to be completed.    Today you received the following chemotherapy and/or immunotherapy agents: Docetaxel, Oxaliplatin, Leucovorin, & Fluorouracil      To help prevent nausea and vomiting after your treatment, we encourage you to take your nausea medication as directed.  BELOW ARE SYMPTOMS THAT SHOULD BE REPORTED IMMEDIATELY: *FEVER GREATER THAN 100.4 F (38 C) OR HIGHER *CHILLS OR SWEATING *NAUSEA AND VOMITING THAT IS NOT CONTROLLED WITH YOUR NAUSEA MEDICATION *UNUSUAL SHORTNESS OF BREATH *UNUSUAL BRUISING OR BLEEDING *URINARY PROBLEMS (pain or burning when urinating, or frequent urination) *BOWEL PROBLEMS (unusual diarrhea, constipation, pain near the anus) TENDERNESS IN MOUTH AND THROAT WITH OR WITHOUT PRESENCE OF ULCERS (sore throat, sores in mouth, or a toothache) UNUSUAL RASH, SWELLING OR PAIN  UNUSUAL VAGINAL DISCHARGE OR ITCHING   Items with * indicate a potential emergency and should be followed up as soon as possible or go to the Emergency Department if any problems should occur.  Please show the CHEMOTHERAPY ALERT CARD or  IMMUNOTHERAPY ALERT CARD at check-in to the Emergency Department and triage nurse.  Should you have questions after your visit or need to cancel or reschedule your appointment, please contact [facility]  Dept: [phone]  and follow the prompts.  Office hours are 8:00 a.m. to 4:30 p.m. Monday - Friday. Please note that voicemails left after 4:00 p.m. may not be returned until the following business day.  We are closed weekends and major holidays. You have access to a nurse at all times for urgent questions. Please call the main number to the clinic Dept: [phone] and follow the prompts.   For any non-urgent questions, you may also contact your provider using MyChart. We now offer e-Visits for anyone 63 and older to request care online for non-urgent symptoms. For details visit [email or web address].   Also download the MyChart app! Go to the app store, search "MyChart", open the app, select [facility], and log in with your MyChart username and password.  Masks are optional in the cancer centers. If you would like for your care team to wear a mask while they are taking care of you, please let them know. You may have one support person who is at least 50 years old accompany you for your appointments.

## 2021-12-07 ENCOUNTER — Inpatient Hospital Stay: Payer: BC Managed Care – PPO

## 2021-12-07 ENCOUNTER — Other Ambulatory Visit: Payer: Self-pay

## 2021-12-07 VITALS — BP 118/59 | HR 79 | Temp 97.3°F | Resp 17

## 2021-12-07 DIAGNOSIS — Z5111 Encounter for antineoplastic chemotherapy: Secondary | ICD-10-CM | POA: Diagnosis not present

## 2021-12-07 DIAGNOSIS — C163 Malignant neoplasm of pyloric antrum: Secondary | ICD-10-CM

## 2021-12-07 MED ORDER — PEGFILGRASTIM-JMDB 6 MG/0.6ML ~~LOC~~ SOSY
6.0000 mg | PREFILLED_SYRINGE | Freq: Once | SUBCUTANEOUS | Status: AC
  Administered 2021-12-07: 6 mg via SUBCUTANEOUS
  Filled 2021-12-07: qty 0.6

## 2021-12-07 MED ORDER — SODIUM CHLORIDE 0.9% FLUSH
10.0000 mL | INTRAVENOUS | Status: DC | PRN
  Administered 2021-12-07: 10 mL

## 2021-12-07 MED ORDER — HEPARIN SOD (PORK) LOCK FLUSH 100 UNIT/ML IV SOLN
500.0000 [IU] | Freq: Once | INTRAVENOUS | Status: AC | PRN
  Administered 2021-12-07: 500 [IU]

## 2021-12-07 NOTE — Patient Instructions (Signed)

## 2021-12-09 ENCOUNTER — Other Ambulatory Visit (HOSPITAL_COMMUNITY): Payer: Self-pay

## 2021-12-09 ENCOUNTER — Other Ambulatory Visit: Payer: Self-pay

## 2021-12-09 ENCOUNTER — Telehealth: Payer: Self-pay | Admitting: Hematology

## 2021-12-09 ENCOUNTER — Inpatient Hospital Stay: Payer: BC Managed Care – PPO

## 2021-12-09 NOTE — Telephone Encounter (Signed)
Spoke with patient spouse to confirm upcoming appointments

## 2021-12-10 ENCOUNTER — Other Ambulatory Visit: Payer: Self-pay

## 2021-12-11 ENCOUNTER — Other Ambulatory Visit (HOSPITAL_COMMUNITY): Payer: Self-pay

## 2021-12-12 ENCOUNTER — Encounter: Payer: Self-pay | Admitting: Hematology

## 2021-12-14 ENCOUNTER — Other Ambulatory Visit (HOSPITAL_COMMUNITY): Payer: Self-pay

## 2021-12-19 ENCOUNTER — Other Ambulatory Visit (HOSPITAL_COMMUNITY): Payer: Self-pay

## 2021-12-19 ENCOUNTER — Encounter: Payer: Self-pay | Admitting: Hematology

## 2021-12-19 NOTE — Progress Notes (Signed)
[facility]   Telephone:(336) [phone] Fax:(336) [phone]   Clinic Follow up Note   Patient Care Team: Pcp, No as PCP - General Truitt Merle, MD as Consulting Physician (Hematology and Oncology)  Date of Service:  12/20/2021  CHIEF COMPLAINT: f/u of gastric cancer    CURRENT THERAPY:   Neoadjuvant FLOT, q14d, starting 11/22/21     ASSESSMENT:  Jonathan Maynard is a 51 y.o. 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 Procedure showed mucosal intramucosal adenocarcinoma. MMR normal. -baseline CEA 11/01/21 significantly elevated at 1,989.58. -he met Dr. Kieth Brightly on 11/05/21. -EUS on 11/06/21 by Dr. Paulita Fujita, staged as T3 N3 (at least stage III) -staging CT CAP 11/08/21 showed: stable gastric antrum wall thickening; increased size of upper abdominal lymph nodes; stable left adrenal nodule, 1.9 cm. -port placed 11/15/21 -he started neoadjuvant FLOT on 11/17.  -PET scan showed FDG avid lymph nodes within the gastrohepatic ligament, portacaval region, and mesentery. Tracer avid left supraclavicular lymph node and indeterminate left adrenal gland nodule with mild FDG uptake. -I recommend IR biopsy of [place] node or adrenal mass, will order  -We discussed the role of surgery, unfortunately upfront surgery is not an option given the suspicion for metastatic disease, we will reevaluate after chemo treatment. -Plan to repeat a CT scan after cycle 5 of 6. -Lab reviewed, adequate for treatment.  Due to his ongoing diarrhea, I will slightly reduce docetaxel dose today.   PLAN: -Discuss with pt about the previous PET Scan, IR biopsy of the [place] node or left adrenal gland nodule - Discuss taking Imodium for diarrhea  -Proceed with C3 FLOT reduce dose Taxotere -lab,flush, f/u and chemo 01/03/22   SUMMARY OF ONCOLOGIC HISTORY: Oncology History Overview Note   Cancer Staging  Gastric  cancer [facility]) Staging form: Stomach, AJCC 8th Edition - Clinical stage from 10/18/2021: Stage III (cT3, cN3a, cM0) - Signed by Truitt Merle, MD on 11/10/2021     Gastric cancer ([place])  10/18/2021 Procedure   EGD performed under the care of Dr. Michail Sermon  Findings:  -A large, ulcerated, partially circumferential (involving two thirds of the lumen circumference) mass with no bleeding and stigmata of recent bleeding was found in the gastric antrum, at the pylorus and in the prepyloric region of the stomach. -Segmental severe inflammation characterized by congestion (edema) and erythema was found in the gastric antrum.   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 Negative for dysplasia.  Negative for helicobacter pylori  Mismatch Repair (MMR) Protein Imunohistochemistry (IHC): IHC Expression Result: MLH1: Preserved nuclear expression. MSH2: Preserved nuclear expression. MSH6: Preserved nuclear expression. PMS2: Preserved nuclear expression. Interpretation: NORMAL   10/31/2021 Initial Diagnosis   Gastric cancer ([place])   11/01/2021 Tumor Marker   CEA = 5,916.38 (^)   11/06/2021 Procedure   Upper EUS, Dr. Paulita Fujita  Impression: - Normal esophagus. - A small amount of food (residue) in the stomach. - Congested, friable (with contact bleeding) and ulcerated mucosa in the prepyloric region of the stomach. - Normal examined duodenum. - There was  no evidence of significant pathology in the left lobe of the liver. - Many abnormal lymph nodes (over 10) were visualized in the  gastrohepatic ligament (level 18), celiac region (level 20), perigastric region, peripancreatic region and aortocaval region. - Wall thickening was seen in the prepyloric region of the stomach. The thickening appeared to be primarily within the deep mucosa (Layer 2) but extended through the muscularis propria. - Overall constellation of findings consistent with at least T3 N3 Mx (at least stage III) gastric adenocarcinoma.   11/08/2021 Imaging   EXAM: CT CHEST, ABDOMEN, AND PELVIS WITH CONTRAST  IMPRESSION: 1. Similar irregular wall thickening of the gastric antrum, consistent with patient's known primary gastric neoplasm. 2. Increased size of the upper abdominal lymph nodes, concerning for worsening nodal disease involvement. 3. Stable left adrenal nodule common nonspecific possibly a metastatic lesion or adenoma. 4. No evidence of metastatic disease in the chest. 5. Left-sided colonic diverticulosis without findings of acute diverticulitis. 6. Enlarged prostate gland. 7.  Aortic Atherosclerosis (ICD10-I70.0).   11/22/2021 -  Chemotherapy   Patient is on Treatment Plan : GASTROESOPHAGEAL FLOT q14d X 4 cycles     11/27/2021 Imaging    IMPRESSION: 1. The mass within the gastric antrum is mildly decreased in size when compared with 11/08/2021. There is persistent FDG uptake associated with this mass compatible with residual tumor. 2. Persistent FDG avid lymph nodes within the gastrohepatic ligament, portacaval region, and mesentery. These are mildly decreased in size when compared with 11/08/2021. 3. Tracer avid left supraclavicular lymph node is also mildly decreased in size when compared with 11/08/2021. 4. Indeterminate left adrenal gland nodule with mild FDG uptake. This may represent a lipid poor adenoma. Metastatic disease not exclude. 5. Diffuse increased uptake throughout the bone marrow is favored to represent treatment related change. 6.  Aortic Atherosclerosis  (ICD10-I70.0).        INTERVAL HISTORY:  Jonathan Maynard is here for a follow up of  gastric cancer     He was last seen by me on 12/06/21 He presents to the clinic alone/accompanied by. Pt stating his having a lot of diarrhea, after he eats . He reports that  he's eating and drinking enough. Two days after chemo he report he feels tired.    All other systems were reviewed with the patient and are negative.  MEDICAL HISTORY:  Past Medical History:  Diagnosis Date   Acute upper GI bleed 04/12/2021   Class 1 obesity 04/12/2021   Diverticulosis 04/12/2021   Hepatic steatosis 04/12/2021    SURGICAL HISTORY: Past Surgical History:  Procedure Laterality Date   BIOPSY  04/12/2021   Procedure: BIOPSY;  Surgeon: Wilford Corner, MD;  Location: [facility];  Service: Gastroenterology;;   ESOPHAGOGASTRODUODENOSCOPY N/A 04/12/2021   Procedure: ESOPHAGOGASTRODUODENOSCOPY (EGD);  Surgeon: Wilford Corner, MD;  Location: [facility];  Service: Gastroenterology;  Laterality: N/A;   ESOPHAGOGASTRODUODENOSCOPY N/A 11/06/2021   Procedure: ESOPHAGOGASTRODUODENOSCOPY (EGD);  Surgeon: Arta Silence, MD;  Location: [facility];  Service: Gastroenterology;  Laterality: N/A;   HERNIA REPAIR     IR IMAGING GUIDED PORT INSERTION  11/15/2021   UPPER ESOPHAGEAL ENDOSCOPIC ULTRASOUND (EUS) Bilateral 11/06/2021   Procedure: UPPER ESOPHAGEAL ENDOSCOPIC ULTRASOUND (EUS);  Surgeon: Arta Silence, MD;  Location: [facility];  Service: Gastroenterology;  Laterality: Bilateral;    I have reviewed the social history and family history with the patient and they are unchanged from previous note.  ALLERGIES:  has No Known Allergies.  MEDICATIONS:  Current Outpatient Medications  Medication Sig Dispense Refill   dexamethasone (DECADRON) 4 MG tablet Take 1 tablet (4 mg total) by mouth daily. Take 1 tab twice the day before chemo, then once daily for 2 days after chemo 30 tablet 1   ferrous sulfate  325 (65 FE) MG tablet Take 1 tablet (325 mg total) by mouth daily. 30 tablet 3   lidocaine-prilocaine (EMLA) cream Apply to affected area as directed once 30 g 3   ondansetron (ZOFRAN) 8 MG tablet Take 1 tablet (8 mg total) by mouth every 8 (eight) hours as needed for nausea or vomiting. Start on the third day after chemotherapy. 30 tablet 1   pantoprazole (PROTONIX) 40 MG tablet Take 1 tablet (40 mg total) by mouth 2 (two) times daily. 60 tablet 3   prochlorperazine (COMPAZINE) 10 MG tablet Take 1 tablet (10 mg total) by mouth every 6 (six) hours as needed for nausea or vomiting. 30 tablet 1   No current facility-administered medications for this visit.   Facility-Administered Medications Ordered in Other Visits  Medication Dose Route Frequency Provider Last Rate Last Admin   DOCEtaxel (TAXOTERE) 80 mg in sodium chloride 0.9 % 250 mL chemo infusion  40 mg/m2 (Treatment Plan Recorded) Intravenous Once Truitt Merle, MD       fluorouracil (ADRUCIL) 5,000 mg in sodium chloride 0.9 % 150 mL chemo infusion  2,575 mg/m2 (Treatment Plan Recorded) Intravenous 1 day or 1 dose Truitt Merle, MD       leucovorin 390 mg in dextrose 5 % 250 mL infusion  200 mg/m2 (Treatment Plan Recorded) Intravenous Once Truitt Merle, MD       oxaliplatin (ELOXATIN) 165 mg in dextrose 5 % 500 mL chemo infusion  85 mg/m2 (Treatment Plan Recorded) Intravenous Once Truitt Merle, MD       palonosetron (ALOXI) injection 0.25 mg  0.25 mg Intravenous Once Truitt Merle, MD        PHYSICAL EXAMINATION: ECOG PERFORMANCE STATUS: 1 - Symptomatic but completely ambulatory  There were no vitals filed for this visit. Wt Readings from Last 3 Encounters:  12/20/21 173 lb 12 oz (78.8 kg)  12/06/21 179 lb 3.2 oz (81.3 kg)  11/22/21 178 lb 12.8 oz (81.1 kg)     GENERAL:alert, no distress and comfortable SKIN: skin color normal, no rashes or significant lesions EYES: normal, Conjunctiva are pink and non-injected, sclera clear  NEURO: alert & oriented  x 3 with fluent speech LABORATORY DATA:  I have reviewed the data as listed    Latest Ref Rng & Units 12/20/2021    7:39 AM 12/06/2021    7:45 AM 11/22/2021    9:50 AM  CBC  WBC 4.0 - 10.5 K/uL 29.2  10.6  6.0   Hemoglobin 13.0 - 17.0 g/dL 10.3  9.0  9.1   Hematocrit 39.0 - 52.0 % 30.3  27.0  27.8   Platelets 150 - 400 K/uL 205  138  230         Latest Ref Rng & Units 12/20/2021    7:39 AM 12/06/2021    7:45 AM 11/22/2021    9:50 AM  CMP  Glucose 70 - 99 mg/dL 117  121  92   BUN 6 - 20 mg/dL _0 Creatinine 0.61 - 1.24 mg/dL 0.79  0.73  0.71   Sodium 135 - 145 mmol/L 137  136  138   Potassium 3.5 - 5.1 mmol/L 3.7  3.7  3.9   Chloride 98 -  111 mmol/L 104  109  108   CO2 22 - 32 mmol/L _0 Calcium 8.9 - 10.3 mg/dL 9.3  8.6  8.9   Total Protein 6.5 - 8.1 g/dL 6.0  6.6  7.2   Total Bilirubin 0.3 - 1.2 mg/dL 0.2  0.3  0.4   Alkaline Phos 38 - 126 U/L 89  72  73   AST 15 - 41 U/L _1 ALT 0 - 44 U/L _2 RADIOGRAPHIC STUDIES: I have personally reviewed the radiological images as listed and agreed with the findings in the report. No results found.    Orders Placed This Encounter  Procedures   [place] CORE BIOPSY (LYMPH NODES)    Biopsy of left [place] node or adrenal mass    Standing Status:   Future    Standing Expiration Date:   12/21/2022    Order Specific Question:   Lab orders requested (DO NOT place separate lab orders, these will be automatically ordered during procedure specimen collection):    Answer:   Surgical Pathology    Order Specific Question:   Reason for Exam (SYMPTOM  OR DIAGNOSIS REQUIRED)    Answer:   rule out distant metastasis    Order Specific Question:   Preferred location?    Answer:   [facility]   All questions were answered. The patient knows to call the clinic with any problems, questions or concerns. No barriers to learning was detected. The total time spent in the appointment was 30 minutes.     Truitt Merle, MD 12/20/2021   Felicity Coyer, CMA, am acting as scribe for Truitt Merle, MD.   I have reviewed the above documentation for accuracy and completeness, and I agree with the above.

## 2021-12-19 NOTE — Assessment & Plan Note (Signed)
stage III, QD6K3C3, stage III, MMR normal -diagnosed 10/18/21 by EGD for 6 month f/u of gastric ulcers. Procedure showed mucosal intramucosal adenocarcinoma. MMR normal. -baseline CEA 11/01/21 significantly elevated at 1,989.58. -he met Dr. Kieth Brightly on 11/05/21. -EUS on 11/06/21 by Dr. Paulita Fujita, staged as T3 N3 (at least stage III) -staging CT CAP 11/08/21 showed: stable gastric antrum wall thickening; increased size of upper abdominal lymph nodes; stable left adrenal nodule, 1.9 cm. -port placed 11/15/21 -he started neoadjuvant FLOT on 11/17.  -PET scan showed FDG avid lymph nodes within the gastrohepatic ligament, portacaval region, and mesentery. Tracer avid left supraclavicular lymph node and indeterminate left adrenal gland nodule with mild FDG uptake.

## 2021-12-20 ENCOUNTER — Inpatient Hospital Stay: Payer: BC Managed Care – PPO

## 2021-12-20 ENCOUNTER — Inpatient Hospital Stay: Payer: BC Managed Care – PPO | Admitting: Dietician

## 2021-12-20 ENCOUNTER — Encounter: Payer: Self-pay | Admitting: Hematology

## 2021-12-20 ENCOUNTER — Inpatient Hospital Stay (HOSPITAL_BASED_OUTPATIENT_CLINIC_OR_DEPARTMENT_OTHER): Payer: BC Managed Care – PPO | Admitting: Hematology

## 2021-12-20 VITALS — BP 119/70 | HR 64 | Temp 98.0°F | Resp 18 | Wt 173.8 lb

## 2021-12-20 DIAGNOSIS — C163 Malignant neoplasm of pyloric antrum: Secondary | ICD-10-CM

## 2021-12-20 DIAGNOSIS — Z5111 Encounter for antineoplastic chemotherapy: Secondary | ICD-10-CM | POA: Diagnosis not present

## 2021-12-20 LAB — CMP (CANCER CENTER ONLY)
ALT: 12 U/L (ref 0–44)
AST: 13 U/L — ABNORMAL LOW (ref 15–41)
Albumin: 3.7 g/dL (ref 3.5–5.0)
Alkaline Phosphatase: 89 U/L (ref 38–126)
Anion gap: 6 (ref 5–15)
BUN: 11 mg/dL (ref 6–20)
CO2: 27 mmol/L (ref 22–32)
Calcium: 9.3 mg/dL (ref 8.9–10.3)
Chloride: 104 mmol/L (ref 98–111)
Creatinine: 0.79 mg/dL (ref 0.61–1.24)
GFR, Estimated: >60 mL/min (ref >60)
Glucose, Bld: 117 mg/dL — ABNORMAL HIGH (ref 70–99)
Potassium: 3.7 mmol/L (ref 3.5–5.1)
Sodium: 137 mmol/L (ref 135–145)
Total Bilirubin: 0.2 mg/dL — ABNORMAL LOW (ref 0.3–1.2)
Total Protein: 6 g/dL — ABNORMAL LOW (ref 6.5–8.1)

## 2021-12-20 LAB — CBC WITH DIFFERENTIAL (CANCER CENTER ONLY)
Abs Immature Granulocytes: 1.76 10*3/uL — ABNORMAL HIGH (ref 0.00–0.07)
Basophils Absolute: 0.1 10*3/uL (ref 0.0–0.1)
Basophils Relative: 0 %
Eosinophils Absolute: 0 10*3/uL (ref 0.0–0.5)
Eosinophils Relative: 0 %
HCT: 30.3 % — ABNORMAL LOW (ref 39.0–52.0)
Hemoglobin: 10.3 g/dL — ABNORMAL LOW (ref 13.0–17.0)
Immature Granulocytes: 6 %
Lymphocytes Relative: 12 %
Lymphs Abs: 3.4 10*3/uL (ref 0.7–4.0)
MCH: 29.9 pg (ref 26.0–34.0)
MCHC: 34 g/dL (ref 30.0–36.0)
MCV: 88.1 fL (ref 80.0–100.0)
Monocytes Absolute: 1.8 10*3/uL — ABNORMAL HIGH (ref 0.1–1.0)
Monocytes Relative: 6 %
Neutro Abs: 22.2 10*3/uL — ABNORMAL HIGH (ref 1.7–7.7)
Neutrophils Relative %: 76 %
Platelet Count: 205 10*3/uL (ref 150–400)
RBC: 3.44 MIL/uL — ABNORMAL LOW (ref 4.22–5.81)
RDW: 17 % — ABNORMAL HIGH (ref 11.5–15.5)
WBC Count: 29.2 10*3/uL — ABNORMAL HIGH (ref 4.0–10.5)
nRBC: 0.3 % — ABNORMAL HIGH (ref 0.0–0.2)

## 2021-12-20 MED ORDER — PALONOSETRON HCL INJECTION 0.25 MG/5ML
0.2500 mg | Freq: Once | INTRAVENOUS | Status: AC
  Administered 2021-12-20: 0.25 mg via INTRAVENOUS
  Filled 2021-12-20: qty 5

## 2021-12-20 MED ORDER — DEXTROSE 5 % IV SOLN: Freq: Once | INTRAVENOUS | Status: AC

## 2021-12-20 MED ORDER — OXALIPLATIN CHEMO INJECTION 100 MG/20ML
85.0000 mg/m2 | Freq: Once | INTRAVENOUS | Status: AC
  Administered 2021-12-20: 165 mg via INTRAVENOUS
  Filled 2021-12-20: qty 33

## 2021-12-20 MED ORDER — SODIUM CHLORIDE 0.9 % IV SOLN
10.0000 mg | Freq: Once | INTRAVENOUS | Status: AC
  Administered 2021-12-20: 10 mg via INTRAVENOUS
  Filled 2021-12-20: qty 1

## 2021-12-20 MED ORDER — SODIUM CHLORIDE 0.9 % IV SOLN
2575.0000 mg/m2 | INTRAVENOUS | Status: DC
  Administered 2021-12-20: 5000 mg via INTRAVENOUS
  Filled 2021-12-20: qty 100

## 2021-12-20 MED ORDER — LEUCOVORIN CALCIUM INJECTION 350 MG
200.0000 mg/m2 | Freq: Once | INTRAVENOUS | Status: AC
  Administered 2021-12-20: 390 mg via INTRAVENOUS
  Filled 2021-12-20: qty 19.5

## 2021-12-20 MED ORDER — SODIUM CHLORIDE 0.9 % IV SOLN
40.0000 mg/m2 | Freq: Once | INTRAVENOUS | Status: AC
  Administered 2021-12-20: 80 mg via INTRAVENOUS
  Filled 2021-12-20: qty 8

## 2021-12-20 NOTE — Progress Notes (Signed)
pt noted to have weight decrease, he was 179lb 12/06/21 --> 173lb today. Reports diarrhea is still not improved, Dr Burr Medico aware. He has not started imodium, pt is planning to begin today. Ok to continue with treatment per Dr Burr Medico.

## 2021-12-20 NOTE — Patient Instructions (Signed)
[facility]  Discharge Instructions: Thank you for choosing [facility] to provide your oncology and hematology care.   If you have a lab appointment with the [facility], please go directly to the [facility] and check in at the registration area.   Wear comfortable clothing and clothing appropriate for easy access to any Portacath or PICC line.   We strive to give you quality time with your provider. You may need to reschedule your appointment if you arrive late (15 or more minutes).  Arriving late affects you and other patients whose appointments are after yours.  Also, if you miss three or more appointments without notifying the office, you may be dismissed from the clinic at the provider's discretion.      For prescription refill requests, have your pharmacy contact our office and allow 72 hours for refills to be completed.    Today you received the following chemotherapy and/or immunotherapy agents: Docetaxel, Oxaliplatin, and Fluorouracil       To help prevent nausea and vomiting after your treatment, we encourage you to take your nausea medication as directed.  BELOW ARE SYMPTOMS THAT SHOULD BE REPORTED IMMEDIATELY: *FEVER GREATER THAN 100.4 F (38 C) OR HIGHER *CHILLS OR SWEATING *NAUSEA AND VOMITING THAT IS NOT CONTROLLED WITH YOUR NAUSEA MEDICATION *UNUSUAL SHORTNESS OF BREATH *UNUSUAL BRUISING OR BLEEDING *URINARY PROBLEMS (pain or burning when urinating, or frequent urination) *BOWEL PROBLEMS (unusual diarrhea, constipation, pain near the anus) TENDERNESS IN MOUTH AND THROAT WITH OR WITHOUT PRESENCE OF ULCERS (sore throat, sores in mouth, or a toothache) UNUSUAL RASH, SWELLING OR PAIN  UNUSUAL VAGINAL DISCHARGE OR ITCHING   Items with * indicate a potential emergency and should be followed up as soon as possible or go to the Emergency Department if any problems should occur.  Please show the CHEMOTHERAPY ALERT CARD or  IMMUNOTHERAPY ALERT CARD at check-in to the Emergency Department and triage nurse.  Should you have questions after your visit or need to cancel or reschedule your appointment, please contact [facility]  Dept: [phone]  and follow the prompts.  Office hours are 8:00 a.m. to 4:30 p.m. Monday - Friday. Please note that voicemails left after 4:00 p.m. may not be returned until the following business day.  We are closed weekends and major holidays. You have access to a nurse at all times for urgent questions. Please call the main number to the clinic Dept: [phone] and follow the prompts.   For any non-urgent questions, you may also contact your provider using MyChart. We now offer e-Visits for anyone 29 and older to request care online for non-urgent symptoms. For details visit [email or web address].   Also download the MyChart app! Go to the app store, search "MyChart", open the app, select [facility], and log in with your MyChart username and password.  Masks are optional in the cancer centers. If you would like for your care team to wear a mask while they are taking care of you, please let them know. You may have one support person who is at least 50 years old accompany you for your appointments.

## 2021-12-20 NOTE — Progress Notes (Signed)
Nutrition Follow-up:  Patient with stage III gastric cancer. He is receiving FLOT q14d. Reduce dose docetaxel noted   RD working remotely.  Spoke with patient and wife via telephone during infusion. Patient says he is feeling great today. He has not had diarrhea in the last 2 days. Wife reports episodes of diarrhea lasting 3-4 days following therapy. Says this happens every time he eats. She is thinking this may be related to milk. Patient has been eating bacon sandwiches for breakfast. He takes TV dinners to work (broccoli and cheese, chicken and rice). Dinners vary. Last night he had meatballs and mashed potatoes. Patient has been drinking Ensure which he tolerates well. They were given samples of Ensure Clear today and liked this.   Medications: reviewed   Labs: glucose 117  Anthropometrics: Wt 173 lb 12 oz today   12/1 - 179 lb 3.2 oz 11/17 - 178 lb 12.8 oz 11/1 - 182 lb 8.7 oz   3.4% (-6 lbs) in 2 weeks - significant   NUTRITION DIAGNOSIS: Unintentional weight loss continues    INTERVENTION:  Educated on strategies for diarrhea, foods best tolerated and foods to limit/avoid - handout provided Encouraged foods with pectin to add bulk to stool (bananas, applesauce, yogurt) - samples of banatrol provided  Suggested switching to fairlife milk (lactose free, low sugar, 13 g protein) Continue drinking Ensure Plus/equivalent, recommend 2x/day as tolerated - samples of CIB provided for alternate ONS option Take imodium per MD Contact information provided     MONITORING, EVALUATION, GOAL: weight trends, intake, diarrhea   NEXT VISIT: Friday December 29 during infusion

## 2021-12-21 ENCOUNTER — Inpatient Hospital Stay: Payer: BC Managed Care – PPO

## 2021-12-21 VITALS — BP 102/62 | HR 61 | Temp 97.7°F | Resp 17 | Ht 65.0 in

## 2021-12-21 DIAGNOSIS — C163 Malignant neoplasm of pyloric antrum: Secondary | ICD-10-CM

## 2021-12-21 DIAGNOSIS — Z5111 Encounter for antineoplastic chemotherapy: Secondary | ICD-10-CM | POA: Diagnosis not present

## 2021-12-21 MED ORDER — PEGFILGRASTIM-JMDB 6 MG/0.6ML ~~LOC~~ SOSY
6.0000 mg | PREFILLED_SYRINGE | Freq: Once | SUBCUTANEOUS | Status: AC
  Administered 2021-12-21: 6 mg via SUBCUTANEOUS

## 2021-12-21 MED ORDER — SODIUM CHLORIDE 0.9% FLUSH
10.0000 mL | INTRAVENOUS | Status: DC | PRN
  Administered 2021-12-21: 10 mL

## 2021-12-21 MED ORDER — HEPARIN SOD (PORK) LOCK FLUSH 100 UNIT/ML IV SOLN
500.0000 [IU] | Freq: Once | INTRAVENOUS | Status: AC | PRN
  Administered 2021-12-21: 500 [IU]

## 2021-12-24 NOTE — Progress Notes (Signed)
Arne Cleveland, MD  Donita Brooks D Ok  [place] core/FNA L supraclav LAN See PET 11/27/21 Im 53 Se 4 and 604  [facility]

## 2022-01-02 MED FILL — Dexamethasone Sodium Phosphate Inj 100 MG/10ML: INTRAMUSCULAR | Qty: 1 | Status: AC

## 2022-01-03 ENCOUNTER — Other Ambulatory Visit: Payer: Self-pay

## 2022-01-03 ENCOUNTER — Other Ambulatory Visit: Payer: Self-pay | Admitting: Hematology

## 2022-01-03 ENCOUNTER — Inpatient Hospital Stay: Payer: BC Managed Care – PPO

## 2022-01-03 ENCOUNTER — Other Ambulatory Visit (HOSPITAL_COMMUNITY): Payer: Self-pay

## 2022-01-03 ENCOUNTER — Inpatient Hospital Stay (HOSPITAL_BASED_OUTPATIENT_CLINIC_OR_DEPARTMENT_OTHER): Payer: BC Managed Care – PPO | Admitting: Physician Assistant

## 2022-01-03 ENCOUNTER — Inpatient Hospital Stay: Payer: BC Managed Care – PPO | Admitting: Dietician

## 2022-01-03 DIAGNOSIS — Z95828 Presence of other vascular implants and grafts: Secondary | ICD-10-CM

## 2022-01-03 DIAGNOSIS — C163 Malignant neoplasm of pyloric antrum: Secondary | ICD-10-CM

## 2022-01-03 DIAGNOSIS — Z5111 Encounter for antineoplastic chemotherapy: Secondary | ICD-10-CM | POA: Diagnosis not present

## 2022-01-03 LAB — CMP (CANCER CENTER ONLY)
ALT: 12 U/L (ref 0–44)
AST: 11 U/L — ABNORMAL LOW (ref 15–41)
Albumin: 3.3 g/dL — ABNORMAL LOW (ref 3.5–5.0)
Alkaline Phosphatase: 78 U/L (ref 38–126)
Anion gap: 5 (ref 5–15)
BUN: 13 mg/dL (ref 6–20)
CO2: 29 mmol/L (ref 22–32)
Calcium: 8.9 mg/dL (ref 8.9–10.3)
Chloride: 105 mmol/L (ref 98–111)
Creatinine: 0.76 mg/dL (ref 0.61–1.24)
GFR, Estimated: >60 mL/min (ref >60)
Glucose, Bld: 131 mg/dL — ABNORMAL HIGH (ref 70–99)
Potassium: 3.6 mmol/L (ref 3.5–5.1)
Sodium: 139 mmol/L (ref 135–145)
Total Bilirubin: 0.2 mg/dL — ABNORMAL LOW (ref 0.3–1.2)
Total Protein: 6 g/dL — ABNORMAL LOW (ref 6.5–8.1)

## 2022-01-03 LAB — CBC WITH DIFFERENTIAL (CANCER CENTER ONLY)
Abs Immature Granulocytes: 0.79 10*3/uL — ABNORMAL HIGH (ref 0.00–0.07)
Basophils Absolute: 0.1 10*3/uL (ref 0.0–0.1)
Basophils Relative: 0 %
Eosinophils Absolute: 0 10*3/uL (ref 0.0–0.5)
Eosinophils Relative: 0 %
HCT: 29.8 % — ABNORMAL LOW (ref 39.0–52.0)
Hemoglobin: 9.8 g/dL — ABNORMAL LOW (ref 13.0–17.0)
Immature Granulocytes: 4 %
Lymphocytes Relative: 19 %
Lymphs Abs: 3.5 10*3/uL (ref 0.7–4.0)
MCH: 29 pg (ref 26.0–34.0)
MCHC: 32.9 g/dL (ref 30.0–36.0)
MCV: 88.2 fL (ref 80.0–100.0)
Monocytes Absolute: 1.3 10*3/uL — ABNORMAL HIGH (ref 0.1–1.0)
Monocytes Relative: 7 %
Neutro Abs: 13.2 10*3/uL — ABNORMAL HIGH (ref 1.7–7.7)
Neutrophils Relative %: 70 %
Platelet Count: 199 10*3/uL (ref 150–400)
RBC: 3.38 MIL/uL — ABNORMAL LOW (ref 4.22–5.81)
RDW: 17.7 % — ABNORMAL HIGH (ref 11.5–15.5)
WBC Count: 18.9 10*3/uL — ABNORMAL HIGH (ref 4.0–10.5)
nRBC: 0 % (ref 0.0–0.2)

## 2022-01-03 MED ORDER — SODIUM CHLORIDE 0.9 % IV SOLN
10.0000 mg | Freq: Once | INTRAVENOUS | Status: AC
  Administered 2022-01-03: 10 mg via INTRAVENOUS
  Filled 2022-01-03: qty 10

## 2022-01-03 MED ORDER — OXALIPLATIN CHEMO INJECTION 100 MG/20ML
85.0000 mg/m2 | Freq: Once | INTRAVENOUS | Status: AC
  Administered 2022-01-03: 165 mg via INTRAVENOUS
  Filled 2022-01-03: qty 33

## 2022-01-03 MED ORDER — DEXTROSE 5 % IV SOLN: Freq: Once | INTRAVENOUS | Status: AC

## 2022-01-03 MED ORDER — SODIUM CHLORIDE 0.9 % IV SOLN
40.0000 mg/m2 | Freq: Once | INTRAVENOUS | Status: AC
  Administered 2022-01-03: 80 mg via INTRAVENOUS
  Filled 2022-01-03: qty 8

## 2022-01-03 MED ORDER — LEUCOVORIN CALCIUM INJECTION 350 MG
200.0000 mg/m2 | Freq: Once | INTRAVENOUS | Status: AC
  Administered 2022-01-03: 390 mg via INTRAVENOUS
  Filled 2022-01-03: qty 19.5

## 2022-01-03 MED ORDER — PROCHLORPERAZINE MALEATE 10 MG PO TABS
10.0000 mg | ORAL_TABLET | Freq: Four times a day (QID) | ORAL | 1 refills | Status: DC | PRN
  Filled 2022-01-03 – 2022-01-18 (×2): qty 30, 8d supply, fill #0
  Filled 2022-02-23: qty 30, 8d supply, fill #1

## 2022-01-03 MED ORDER — DEXAMETHASONE 4 MG PO TABS
4.0000 mg | ORAL_TABLET | Freq: Every day | ORAL | 1 refills | Status: DC
  Filled 2022-01-03: qty 24, 24d supply, fill #0
  Filled 2022-01-18: qty 30, 30d supply, fill #0
  Filled 2022-02-28: qty 30, 30d supply, fill #1

## 2022-01-03 MED ORDER — SODIUM CHLORIDE 0.9% FLUSH: 10.0000 mL | INTRAVENOUS | Status: DC | PRN

## 2022-01-03 MED ORDER — SODIUM CHLORIDE 0.9% FLUSH
10.0000 mL | INTRAVENOUS | Status: AC | PRN
  Administered 2022-01-03: 10 mL

## 2022-01-03 MED ORDER — SODIUM CHLORIDE 0.9 % IV SOLN: INTRAVENOUS | Status: DC

## 2022-01-03 MED ORDER — HEPARIN SOD (PORK) LOCK FLUSH 100 UNIT/ML IV SOLN: 500.0000 [IU] | Freq: Once | INTRAVENOUS | Status: DC | PRN

## 2022-01-03 MED ORDER — PALONOSETRON HCL INJECTION 0.25 MG/5ML
0.2500 mg | Freq: Once | INTRAVENOUS | Status: AC
  Administered 2022-01-03: 0.25 mg via INTRAVENOUS
  Filled 2022-01-03: qty 5

## 2022-01-03 MED ORDER — SODIUM CHLORIDE 0.9 % IV SOLN
2575.0000 mg/m2 | INTRAVENOUS | Status: DC
  Administered 2022-01-03: 5000 mg via INTRAVENOUS
  Filled 2022-01-03: qty 100

## 2022-01-03 NOTE — Patient Instructions (Signed)
[facility]  Discharge Instructions: Thank you for choosing [facility] to provide your oncology and hematology care.   If you have a lab appointment with the [facility], please go directly to the [facility] and check in at the registration area.   Wear comfortable clothing and clothing appropriate for easy access to any Portacath or PICC line.   We strive to give you quality time with your provider. You may need to reschedule your appointment if you arrive late (15 or more minutes).  Arriving late affects you and other patients whose appointments are after yours.  Also, if you miss three or more appointments without notifying the office, you may be dismissed from the clinic at the provider's discretion.      For prescription refill requests, have your pharmacy contact our office and allow 72 hours for refills to be completed.    Today you received the following chemotherapy and/or immunotherapy agents: Docetaxel, Leucovorin, Oxaliplatin, Fluorouracil.   To help prevent nausea and vomiting after your treatment, we encourage you to take your nausea medication as directed.  BELOW ARE SYMPTOMS THAT SHOULD BE REPORTED IMMEDIATELY: *FEVER GREATER THAN 100.4 F (38 C) OR HIGHER *CHILLS OR SWEATING *NAUSEA AND VOMITING THAT IS NOT CONTROLLED WITH YOUR NAUSEA MEDICATION *UNUSUAL SHORTNESS OF BREATH *UNUSUAL BRUISING OR BLEEDING *URINARY PROBLEMS (pain or burning when urinating, or frequent urination) *BOWEL PROBLEMS (unusual diarrhea, constipation, pain near the anus) TENDERNESS IN MOUTH AND THROAT WITH OR WITHOUT PRESENCE OF ULCERS (sore throat, sores in mouth, or a toothache) UNUSUAL RASH, SWELLING OR PAIN  UNUSUAL VAGINAL DISCHARGE OR ITCHING   Items with * indicate a potential emergency and should be followed up as soon as possible or go to the Emergency Department if any problems should occur.  Please show the CHEMOTHERAPY ALERT CARD or  IMMUNOTHERAPY ALERT CARD at check-in to the Emergency Department and triage nurse.  Should you have questions after your visit or need to cancel or reschedule your appointment, please contact [facility]  Dept: [phone]  and follow the prompts.  Office hours are 8:00 a.m. to 4:30 p.m. Monday - Friday. Please note that voicemails left after 4:00 p.m. may not be returned until the following business day.  We are closed weekends and major holidays. You have access to a nurse at all times for urgent questions. Please call the main number to the clinic Dept: [phone] and follow the prompts.   For any non-urgent questions, you may also contact your provider using MyChart. We now offer e-Visits for anyone 12 and older to request care online for non-urgent symptoms. For details visit [email or web address].   Also download the MyChart app! Go to the app store, search "MyChart", open the app, select [facility], and log in with your MyChart username and password.

## 2022-01-03 NOTE — Progress Notes (Signed)
Per Murray Hodgkins, [place], will keep the reduced dose of docetaxel today.   Laray Anger, PharmD PGY-2 Pharmacy Resident Hematology/Oncology [phone]  01/03/2022 10:32 AM

## 2022-01-03 NOTE — Progress Notes (Signed)
Welcome   [phone] [phone]   Clinic Follow up Note   Patient Care Team: Pcp, No as PCP - General Truitt Merle, MD as Consulting Physician (Hematology and Oncology)  Date of Service:  01/03/2022  CHIEF COMPLAINT: f/u of gastric cancer    CURRENT THERAPY:  Neoadjuvant FLOT, q14d, starting 11/22/21   SUMMARY OF ONCOLOGIC HISTORY: Oncology History Overview Note   Cancer Staging  Gastric cancer [facility]) Staging form: Stomach, AJCC 8th Edition - Clinical stage from 10/18/2021: Stage III (cT3, cN3a, cM0) - Signed by Truitt Merle, MD on 11/10/2021     Gastric cancer ([place])  10/18/2021 Procedure   EGD performed under the care of Dr. Michail Sermon  Findings:  -A large, ulcerated, partially circumferential (involving two thirds of the lumen circumference) mass with no bleeding and stigmata of recent bleeding was found in the gastric antrum, at the pylorus and in the prepyloric region of the stomach. -Segmental severe inflammation characterized by congestion (edema) and erythema was found in the gastric antrum.   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 Negative for dysplasia.  Negative for helicobacter pylori  Mismatch Repair (MMR) Protein Imunohistochemistry (IHC): IHC Expression Result: MLH1: Preserved nuclear expression. MSH2: Preserved nuclear expression. MSH6: Preserved nuclear expression. PMS2: Preserved nuclear expression. Interpretation: NORMAL   10/31/2021 Initial Diagnosis   Gastric cancer ([place])   11/01/2021 Tumor  Marker   CEA = 3,810.17 (^)   11/06/2021 Procedure   Upper EUS, Dr. Paulita Fujita  Impression: - Normal esophagus. - A small amount of food (residue) in the stomach. - Congested, friable (with contact bleeding) and ulcerated mucosa in the prepyloric region of the stomach. - Normal examined duodenum. - There was no evidence of significant pathology in the left lobe of the liver. - Many abnormal lymph nodes (over 10) were visualized in the gastrohepatic ligament (level 18), celiac region (level 20), perigastric region, peripancreatic region and aortocaval region. - Wall thickening was seen in the prepyloric region of the stomach. The thickening appeared to be primarily within the deep mucosa (Layer 2) but extended through the muscularis propria. - Overall constellation of findings consistent with at least T3 N3 Mx (at least stage III) gastric adenocarcinoma.   11/08/2021 Imaging   EXAM: CT CHEST, ABDOMEN, AND PELVIS WITH CONTRAST  IMPRESSION: 1. Similar irregular wall thickening of the gastric antrum, consistent with patient's known primary gastric neoplasm. 2. Increased size of the upper abdominal lymph nodes, concerning for worsening nodal disease involvement. 3. Stable left adrenal nodule common nonspecific possibly a metastatic lesion or adenoma. 4. No evidence of metastatic disease in the chest. 5. Left-sided colonic diverticulosis without findings of acute diverticulitis. 6. Enlarged prostate gland. 7.  Aortic Atherosclerosis (ICD10-I70.0).   11/22/2021 -  Chemotherapy   Patient is on Treatment Plan : GASTROESOPHAGEAL FLOT q14d X 4 cycles     11/27/2021 Imaging    IMPRESSION: 1. The mass within the gastric antrum is mildly decreased in size when compared with 11/08/2021. There is persistent FDG uptake associated with this mass compatible with residual tumor. 2. Persistent FDG avid lymph nodes within the gastrohepatic ligament, portacaval region, and  mesentery. These are  mildly decreased in size when compared with 11/08/2021. 3. Tracer avid left supraclavicular lymph node is also mildly decreased in size when compared with 11/08/2021. 4. Indeterminate left adrenal gland nodule with mild FDG uptake. This may represent a lipid poor adenoma. Metastatic disease not exclude. 5. Diffuse increased uptake throughout the bone marrow is favored to represent treatment related change. 6.  Aortic Atherosclerosis (ICD10-I70.0).        INTERVAL HISTORY:  Jonathan Maynard is here for a follow up of  gastric cancer   He was last seen by Dr. Burr Medico on 12/20/21. He is due for cycle 4, day 1 today.   Jonathan Maynard reports that he is tolerating chemotherapy well. He does have some fatigue after chemotherpay but is able to complete all his ADLs on his own. He tries to go for walks daily. His appetite is stable without any weight loss. He denies nausae, vomiting or abdominal pain. His diarrhea has resolved and has regular bowel movements now. He denies easy bruising or signs of active bleeding. He has cold sensitivity but denies any residual neuropathy. He reports improvement of his lower extremity edema. He is compliant with wearing compression stockings. He denies fevers, chills, sweats, chest pain or cough. He has no other complaints.   All other systems were reviewed with the patient and are negative.  MEDICAL HISTORY:  Past Medical History:  Diagnosis Date   Acute upper GI bleed 04/12/2021   Class 1 obesity 04/12/2021   Diverticulosis 04/12/2021   Hepatic steatosis 04/12/2021    SURGICAL HISTORY: Past Surgical History:  Procedure Laterality Date   BIOPSY  04/12/2021   Procedure: BIOPSY;  Surgeon: Wilford Corner, MD;  Location: [facility];  Service: Gastroenterology;;   ESOPHAGOGASTRODUODENOSCOPY N/A 04/12/2021   Procedure: ESOPHAGOGASTRODUODENOSCOPY (EGD);  Surgeon: Wilford Corner, MD;  Location: [facility];  Service: Gastroenterology;  Laterality: N/A;    ESOPHAGOGASTRODUODENOSCOPY N/A 11/06/2021   Procedure: ESOPHAGOGASTRODUODENOSCOPY (EGD);  Surgeon: Arta Silence, MD;  Location: [facility];  Service: Gastroenterology;  Laterality: N/A;   HERNIA REPAIR     IR IMAGING GUIDED PORT INSERTION  11/15/2021   UPPER ESOPHAGEAL ENDOSCOPIC ULTRASOUND (EUS) Bilateral 11/06/2021   Procedure: UPPER ESOPHAGEAL ENDOSCOPIC ULTRASOUND (EUS);  Surgeon: Arta Silence, MD;  Location: [facility];  Service: Gastroenterology;  Laterality: Bilateral;    I have reviewed the social history and family history with the patient and they are unchanged from previous note.  ALLERGIES:  has No Known Allergies.  MEDICATIONS:  Current Outpatient Medications  Medication Sig Dispense Refill   ferrous sulfate 325 (65 FE) MG tablet Take 1 tablet (325 mg total) by mouth daily. 30 tablet 3   ondansetron (ZOFRAN) 8 MG tablet Take 1 tablet (8 mg total) by mouth every 8 (eight) hours as needed for nausea or vomiting. Start on the third day after chemotherapy. 30 tablet 1   pantoprazole (PROTONIX) 40 MG tablet Take 1 tablet (40 mg total) by mouth 2 (two) times daily. 60 tablet 3   dexamethasone (DECADRON) 4 MG tablet Take 1 tablet (4 mg total) by mouth daily. Take 1 tab twice the day before chemo, then once daily for 2 days after chemo 30 tablet 1   lidocaine-prilocaine (EMLA) cream Apply to affected area as directed once (Patient not taking: Reported on 01/03/2022) 30 g 3   prochlorperazine (COMPAZINE) 10 MG tablet Take 1 tablet (10 mg total) by mouth every 6 (six) hours as needed for nausea or vomiting. 30 tablet 1  No current facility-administered medications for this visit.   Facility-Administered Medications Ordered in Other Visits  Medication Dose Route Frequency Provider Last Rate Last Admin   0.9 %  sodium chloride infusion   Intravenous Continuous Truitt Merle, MD       dexamethasone (DECADRON) 10 mg in sodium chloride 0.9 % 50 mL IVPB  10 mg Intravenous Once Truitt Merle, MD       dextrose 5 % solution   Intravenous Once Truitt Merle, MD       DOCEtaxel (TAXOTERE) 100 mg in sodium chloride 0.9 % 250 mL chemo infusion  50 mg/m2 (Treatment Plan Recorded) Intravenous Once Truitt Merle, MD       fluorouracil (ADRUCIL) 5,050 mg in sodium chloride 0.9 % 149 mL chemo infusion  2,600 mg/m2 (Treatment Plan Recorded) Intravenous 1 day or 1 dose Truitt Merle, MD       heparin lock flush 100 unit/mL  500 Units Intracatheter Once PRN Truitt Merle, MD       leucovorin 390 mg in dextrose 5 % 250 mL infusion  200 mg/m2 (Treatment Plan Recorded) Intravenous Once Truitt Merle, MD       oxaliplatin (ELOXATIN) 165 mg in dextrose 5 % 500 mL chemo infusion  85 mg/m2 (Treatment Plan Recorded) Intravenous Once Truitt Merle, MD       palonosetron (ALOXI) injection 0.25 mg  0.25 mg Intravenous Once Truitt Merle, MD       sodium chloride flush (NS) 0.9 % injection 10 mL  10 mL Intracatheter PRN Truitt Merle, MD        PHYSICAL EXAMINATION: ECOG PERFORMANCE STATUS: 1 - Symptomatic but completely ambulatory  Vitals:   01/03/22 0907  BP: 119/65  Pulse: 76  Resp: 16  Temp: 98.4 F (36.9 C)  SpO2: 100%   Wt Readings from Last 3 Encounters:  01/03/22 182 lb 8 oz (82.8 kg)  12/20/21 173 lb 12 oz (78.8 kg)  12/06/21 179 lb 3.2 oz (81.3 kg)     Constitutional: Oriented to person, place, and time and well-developed, well-nourished, and in no distress.  HENT:  Head: Normocephalic and atraumatic.  Eyes: Conjunctivae are normal. Right eye exhibits no discharge. Left eye exhibits no discharge. No scleral icterus.  Neck: Normal range of motion. Neck supple.   Cardiovascular: Normal rate, regular rhythm, normal heart sounds Pulmonary/Chest: Effort normal and breath sounds normal. No respiratory distress. No wheezes. No rales.  Musculoskeletal: Normal range of motion. +Bilateral lower extremity edema starting mid calf. Wearing compression stockings Lymphadenopathy: No cervical adenopathy.  Neurological:  Alert and oriented to person, place, and time. Exhibits normal muscle tone. Gait normal. Coordination normal.  Skin: Skin is warm and dry. No rash noted. Not diaphoretic. No erythema. No pallor.  Psychiatric: Mood, memory and judgment normal.      LABORATORY DATA:  I have reviewed the data as listed    Latest Ref Rng & Units 01/03/2022    8:32 AM 12/20/2021    7:39 AM 12/06/2021    7:45 AM  CBC  WBC 4.0 - 10.5 K/uL 18.9  29.2  10.6   Hemoglobin 13.0 - 17.0 g/dL 9.8  10.3  9.0   Hematocrit 39.0 - 52.0 % 29.8  30.3  27.0   Platelets 150 - 400 K/uL 199  205  138         Latest Ref Rng & Units 01/03/2022    8:32 AM 12/20/2021    7:39 AM 12/06/2021    7:45 AM  CMP  Glucose 70 - 99 mg/dL 131  117  121   BUN 6 - 20 mg/dL _0 Creatinine 0.61 - 1.24 mg/dL 0.76  0.79  0.73   Sodium 135 - 145 mmol/L 139  137  136   Potassium 3.5 - 5.1 mmol/L 3.6  3.7  3.7   Chloride 98 - 111 mmol/L 105  104  109   CO2 22 - 32 mmol/L _1 Calcium 8.9 - 10.3 mg/dL 8.9  9.3  8.6   Total Protein 6.5 - 8.1 g/dL 6.0  6.0  6.6   Total Bilirubin 0.3 - 1.2 mg/dL 0.2  0.2  0.3   Alkaline Phos 38 - 126 U/L 78  89  72   AST 15 - 41 U/L _2 ALT 0 - 44 U/L _3 RADIOGRAPHIC STUDIES: I have personally reviewed the radiological images as listed and agreed with the findings in the report. No results found.   ASSESSMENT:  Jonathan Maynard is a 51 y.o. male with    Gastric cancer ([place]) stage III, cT3N3M0, stage III, MMR normal -diagnosed 10/18/21 by EGD for 6 month f/u of gastric ulcers. Procedure showed mucosal intramucosal adenocarcinoma. MMR normal. -baseline CEA 11/01/21 significantly elevated at 1,989.58. -he met Dr. Kieth Brightly on 11/05/21. -EUS on 11/06/21 by Dr. Paulita Fujita, staged as T3 N3 (at least stage III) -staging CT CAP 11/08/21 showed: stable gastric antrum wall thickening; increased size of upper abdominal lymph nodes; stable left adrenal nodule, 1.9  cm. -port placed 11/15/21 -he started neoadjuvant FLOT on 11/17.  -PET scan showed FDG avid lymph nodes within the gastrohepatic ligament, portacaval region, and mesentery. Tracer avid left supraclavicular lymph node and indeterminate left adrenal gland nodule with mild FDG uptake. -We discussed the role of surgery, unfortunately upfront surgery is not an option given the suspicion for metastatic disease, we will reevaluate after chemo treatment. -Dose reduced taxotere from 60 mg/m2 to 50 mg/m2 starting Cycle 3 due to diarrhea.  -Plan to repeat a CT scan after cycle 5 of 6.   PLAN: -Due for cycle 4, Day 1 of FLOT chemotherapy -Labs from today were reviewed and adequate for treatment. WBC 18.9 (elevated due to GCSF injection), stable anemia with Hgb 9.8, Plt 199K. Creatinine and LFTs in range.  -No prohibitive toxicities so recommend to continue without any dose modifications -RTC in 2 weeks for labs and follow up visit with Dr. Burr Medico before cycle 5.  -Scheduled for US guided biopsy of supraclavicular lymph node on 01/22/2022 to rule out distant metastatic disease.    All questions were answered. The patient knows to call the clinic with any problems, questions or concerns. No barriers to learning was detected.    I have spent a total of 30 minutes minutes of face-to-face and non-face-to-face time, preparing to see the patient, performing a medically appropriate examination, counseling and educating the patient,documenting clinical information in the electronic health record, and care coordination.   Dede Query PA-C Dept of Hematology and [place] at [facility] Phone: [phone]

## 2022-01-03 NOTE — Progress Notes (Signed)
Per Ennever MD, ok to speed Fluorouracil pump up by 2 hours, to run over 22 hours. This is per patient preference to come in earlier on Saturday. Rate increase cleared with [facility].

## 2022-01-04 ENCOUNTER — Other Ambulatory Visit (HOSPITAL_COMMUNITY): Payer: Self-pay

## 2022-01-04 ENCOUNTER — Inpatient Hospital Stay: Payer: BC Managed Care – PPO

## 2022-01-04 VITALS — BP 128/75 | HR 89 | Temp 98.4°F | Resp 20

## 2022-01-04 DIAGNOSIS — C163 Malignant neoplasm of pyloric antrum: Secondary | ICD-10-CM

## 2022-01-04 DIAGNOSIS — Z5111 Encounter for antineoplastic chemotherapy: Secondary | ICD-10-CM | POA: Diagnosis not present

## 2022-01-04 MED ORDER — SODIUM CHLORIDE 0.9% FLUSH
10.0000 mL | INTRAVENOUS | Status: DC | PRN
  Administered 2022-01-04: 10 mL

## 2022-01-04 MED ORDER — HEPARIN SOD (PORK) LOCK FLUSH 100 UNIT/ML IV SOLN
500.0000 [IU] | Freq: Once | INTRAVENOUS | Status: AC | PRN
  Administered 2022-01-04: 500 [IU]

## 2022-01-04 MED ORDER — PEGFILGRASTIM-JMDB 6 MG/0.6ML ~~LOC~~ SOSY
6.0000 mg | PREFILLED_SYRINGE | Freq: Once | SUBCUTANEOUS | Status: AC
  Administered 2022-01-04: 6 mg via SUBCUTANEOUS

## 2022-01-10 ENCOUNTER — Other Ambulatory Visit (HOSPITAL_COMMUNITY): Payer: Self-pay

## 2022-01-16 MED FILL — Dexamethasone Sodium Phosphate Inj 100 MG/10ML: INTRAMUSCULAR | Qty: 1 | Status: AC

## 2022-01-16 NOTE — Progress Notes (Signed)
[place]   [phone] [phone]   Clinic Follow up Note   Patient Care Team: Pcp, No as PCP - General Truitt Merle, MD as Consulting Physician (Hematology and Oncology)  Date of Service:  01/17/2022  CHIEF COMPLAINT: f/u of gastric cancer    CURRENT THERAPY:  Neoadjuvant FLOT, q14d, starting 11/22/21     ASSESSMENT:  Jonathan Maynard is a 52 y.o. 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 Procedure showed mucosal intramucosal adenocarcinoma. MMR normal. -baseline CEA 11/01/21 significantly elevated at 1,989.58. -he met Dr. Kieth Brightly on 11/05/21. -EUS on 11/06/21 by Dr. Paulita Fujita, staged as T3 N3 (at least stage III) -staging CT CAP 11/08/21 showed: stable gastric antrum wall thickening; increased size of upper abdominal lymph nodes; stable left adrenal nodule, 1.9 cm. -he started neoadjuvant FLOT on 11/17.  -PET scan showed FDG avid lymph nodes within the gastrohepatic ligament, portacaval region, and mesentery. Tracer avid left supraclavicular lymph node and indeterminate left adrenal gland nodule with mild FDG uptake. -he is scheduled for [place] node biopsy on 1/17  -he is tolerating chemo FLOT well, will continue, plan to repeat CT after C6 -will repeat CEA    PLAN: -lab reviewed. - [place] node Biopsy scheduled on 01/22/2022 -order CT scan to be done in 3-4 weeks  -proceed with C5 FLOT lab adequate for treatment -f/u in2 weeks before C6    SUMMARY OF ONCOLOGIC HISTORY: Oncology History Overview Note   Cancer Staging  Gastric cancer [facility]) Staging form: Stomach, AJCC 8th Edition - Clinical stage from 10/18/2021: Stage III (cT3, cN3a, cM0) - Signed by Truitt Merle, MD on 11/10/2021     Gastric cancer ([place])  10/18/2021 Procedure   EGD performed under the care of Dr. Michail Sermon  Findings:  -A large, ulcerated, partially circumferential (involving  two thirds of the lumen circumference) mass with no bleeding and stigmata of recent bleeding was found in the gastric antrum, at the pylorus and in the prepyloric region of the stomach. -Segmental severe inflammation characterized by congestion (edema) and erythema was found in the gastric antrum.   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 Negative for dysplasia.  Negative for helicobacter pylori  Mismatch Repair (MMR) Protein Imunohistochemistry (IHC): IHC Expression Result: MLH1: Preserved nuclear expression. MSH2: Preserved nuclear expression. MSH6: Preserved nuclear expression. PMS2: Preserved nuclear expression. Interpretation: NORMAL   10/31/2021 Initial Diagnosis   Gastric cancer ([place])   11/01/2021 Tumor Marker   CEA = 3,976.73 (^)   11/06/2021 Procedure   Upper EUS, Dr. Paulita Fujita  Impression: - Normal esophagus. - A small amount of food (residue) in the stomach. - Congested, friable (with contact bleeding) and ulcerated mucosa in the prepyloric region of the stomach. - Normal examined duodenum. - There was no evidence of significant pathology in the left lobe of the liver. - Many abnormal lymph nodes (over 10) were visualized in the gastrohepatic ligament (level 18), celiac region (level 20), perigastric region, peripancreatic region and aortocaval region. - Wall thickening was seen in the prepyloric region of  the stomach. The thickening appeared to be primarily within the deep mucosa (Layer 2) but extended through the muscularis propria. - Overall  constellation of findings consistent with at least T3 N3 Mx (at least stage III) gastric adenocarcinoma.   11/08/2021 Imaging   EXAM: CT CHEST, ABDOMEN, AND PELVIS WITH CONTRAST  IMPRESSION: 1. Similar irregular wall thickening of the gastric antrum, consistent with patient's known primary gastric neoplasm. 2. Increased size of the upper abdominal lymph nodes, concerning for worsening nodal disease involvement. 3. Stable left adrenal nodule common nonspecific possibly a metastatic lesion or adenoma. 4. No evidence of metastatic disease in the chest. 5. Left-sided colonic diverticulosis without findings of acute diverticulitis. 6. Enlarged prostate gland. 7.  Aortic Atherosclerosis (ICD10-I70.0).   11/22/2021 -  Chemotherapy   Patient is on Treatment Plan : GASTROESOPHAGEAL FLOT q14d X 4 cycles     11/27/2021 Imaging    IMPRESSION: 1. The mass within the gastric antrum is mildly decreased in size when compared with 11/08/2021. There is persistent FDG uptake associated with this mass compatible with residual tumor. 2. Persistent FDG avid lymph nodes within the gastrohepatic ligament, portacaval region, and mesentery. These are mildly decreased in size when compared with 11/08/2021. 3. Tracer avid left supraclavicular lymph node is also mildly decreased in size when compared with 11/08/2021. 4. Indeterminate left adrenal gland nodule with mild FDG uptake. This may represent a lipid poor adenoma. Metastatic disease not exclude. 5. Diffuse increased uptake throughout the bone marrow is favored to represent treatment related change. 6.  Aortic Atherosclerosis (ICD10-I70.0).        INTERVAL HISTORY:  Jonathan Maynard is here for a follow up of gastric cancer   He was last seen by Elsie Lincoln on 01/03/2022 He presents to the clinic accompanied by wife. Pt states he had no problems from chemo. But experience fatigue one day. He denies numbness and tingling. He has  some dark skin and dry ness but continues to use the cream.Pt asked about his taste buds. Pt has no pain in stomach. Pt still works as an Clinical biochemist.    All other systems were reviewed with the patient and are negative.  MEDICAL HISTORY:  Past Medical History:  Diagnosis Date   Acute upper GI bleed 04/12/2021   Class 1 obesity 04/12/2021   Diverticulosis 04/12/2021   Hepatic steatosis 04/12/2021    SURGICAL HISTORY: Past Surgical History:  Procedure Laterality Date   BIOPSY  04/12/2021   Procedure: BIOPSY;  Surgeon: Wilford Corner, MD;  Location: [facility];  Service: Gastroenterology;;   ESOPHAGOGASTRODUODENOSCOPY N/A 04/12/2021   Procedure: ESOPHAGOGASTRODUODENOSCOPY (EGD);  Surgeon: Wilford Corner, MD;  Location: [facility];  Service: Gastroenterology;  Laterality: N/A;   ESOPHAGOGASTRODUODENOSCOPY N/A 11/06/2021   Procedure: ESOPHAGOGASTRODUODENOSCOPY (EGD);  Surgeon: Arta Silence, MD;  Location: [facility];  Service: Gastroenterology;  Laterality: N/A;   HERNIA REPAIR     IR IMAGING GUIDED PORT INSERTION  11/15/2021   UPPER ESOPHAGEAL ENDOSCOPIC ULTRASOUND (EUS) Bilateral 11/06/2021   Procedure: UPPER ESOPHAGEAL ENDOSCOPIC ULTRASOUND (EUS);  Surgeon: Arta Silence, MD;  Location: [facility];  Service: Gastroenterology;  Laterality: Bilateral;    I have reviewed the social history and family history with the patient and they are unchanged from previous note.  ALLERGIES:  has No Known Allergies.  MEDICATIONS:  Current Outpatient Medications  Medication Sig Dispense Refill   dexamethasone (DECADRON) 4 MG tablet Take 1 tablet by mouth twice a day the day before chemo.  Then take 1 tablet by  mouth once daily for 2 days after chemo. 30 tablet 1   ferrous sulfate 325 (65 FE) MG tablet Take 1 tablet (325 mg total) by mouth daily. 30 tablet 3   lidocaine-prilocaine (EMLA) cream Apply to affected area as directed once (Patient not taking: Reported on 01/03/2022) 30 g 3    ondansetron (ZOFRAN) 8 MG tablet Take 1 tablet (8 mg total) by mouth every 8 (eight) hours as needed for nausea or vomiting. Start on the third day after chemotherapy. 30 tablet 1   pantoprazole (PROTONIX) 40 MG tablet Take 1 tablet (40 mg total) by mouth 2 (two) times daily. 60 tablet 3   prochlorperazine (COMPAZINE) 10 MG tablet Take 1 tablet (10 mg total) by mouth every 6 (six) hours as needed for nausea or vomiting. 30 tablet 1   No current facility-administered medications for this visit.   Facility-Administered Medications Ordered in Other Visits  Medication Dose Route Frequency Provider Last Rate Last Admin   fluorouracil (ADRUCIL) 5,000 mg in sodium chloride 0.9 % 150 mL chemo infusion  2,575 mg/m2 (Treatment Plan Recorded) Intravenous 1 day or 1 dose Truitt Merle, MD       leucovorin 390 mg in dextrose 5 % 250 mL infusion  200 mg/m2 (Treatment Plan Recorded) Intravenous Once Truitt Merle, MD 135 mL/hr at 01/17/22 1125 390 mg at 01/17/22 1125   oxaliplatin (ELOXATIN) 165 mg in dextrose 5 % 500 mL chemo infusion  85 mg/m2 (Treatment Plan Recorded) Intravenous Once Truitt Merle, MD 267 mL/hr at 01/17/22 1128 165 mg at 01/17/22 1128   sodium chloride flush (NS) 0.9 % injection 10 mL  10 mL Intracatheter PRN Truitt Merle, MD        PHYSICAL EXAMINATION: ECOG PERFORMANCE STATUS: 0 - Asymptomatic  Vitals:   01/17/22 0900  BP: 104/72  Pulse: 92  Resp: 15  Temp: 98.6 F (37 C)  SpO2: 99%   Wt Readings from Last 3 Encounters:  01/17/22 182 lb 11.2 oz (82.9 kg)  01/03/22 182 lb 8 oz (82.8 kg)  12/20/21 173 lb 12 oz (78.8 kg)     GENERAL:alert, no distress and comfortable SKIN: skin color normal, no rashes or significant lesions EYES: normal, Conjunctiva are pink and non-injected, sclera clear  NEURO: alert & oriented x 3 with fluent speech LABORATORY DATA:  I have reviewed the data as listed    Latest Ref Rng & Units 01/17/2022    8:17 AM 01/03/2022    8:32 AM 12/20/2021    7:39 AM   CBC  WBC 4.0 - 10.5 K/uL 22.1  18.9  29.2   Hemoglobin 13.0 - 17.0 g/dL 10.8  9.8  10.3   Hematocrit 39.0 - 52.0 % 32.4  29.8  30.3   Platelets 150 - 400 K/uL 202  199  205         Latest Ref Rng & Units 01/17/2022    8:17 AM 01/03/2022    8:32 AM 12/20/2021    7:39 AM  CMP  Glucose 70 - 99 mg/dL 109  131  117   BUN 6 - 20 mg/dL '12  13  11   '$ Creatinine 0.61 - 1.24 mg/dL 0.75  0.76  0.79   Sodium 135 - 145 mmol/L 138  139  137   Potassium 3.5 - 5.1 mmol/L 3.8  3.6  3.7   Chloride 98 - 111 mmol/L 103  105  104   CO2 22 - 32 mmol/L '29  29  27   '$ Calcium  8.9 - 10.3 mg/dL 9.1  8.9  9.3   Total Protein 6.5 - 8.1 g/dL 6.0  6.0  6.0   Total Bilirubin 0.3 - 1.2 mg/dL 0.2  0.2  0.2   Alkaline Phos 38 - 126 U/L 90  78  89   AST 15 - 41 U/L '15  11  13   '$ ALT 0 - 44 U/L '18  12  12       '$ RADIOGRAPHIC STUDIES: I have personally reviewed the radiological images as listed and agreed with the findings in the report. No results found.    Orders Placed This Encounter  Procedures   CT CHEST ABDOMEN PELVIS W CONTRAST    Standing Status:   Future    Standing Expiration Date:   01/18/2023    Order Specific Question:   Preferred imaging location?    Answer:   [facility]    Order Specific Question:   Release to patient    Answer:   Manual release only    Order Specific Question:   Reason for preventing immediate release    Answer:   Patient request [1]    Order Specific Question:   Additional details for preventing immediate release    Answer:   pt wants me to review first    Order Specific Question:   Is Oral Contrast requested for this exam?    Answer:   Yes, Per Radiology protocol   CBC with Differential ([place] Only)    Standing Status:   Future    Standing Expiration Date:   02/01/2023   CMP ([place] only)    Standing Status:   Future    Standing Expiration Date:   02/01/2023   CBC with Differential ([place] Only)    Standing Status:   Future    Standing  Expiration Date:   02/15/2023   CMP ([place] only)    Standing Status:   Future    Standing Expiration Date:   02/15/2023   CEA (IN HOUSE-[facility])    Standing Status:   Standing    Number of Occurrences:   5    Standing Expiration Date:   01/18/2023   CBC with Differential ([place] Only)    Standing Status:   Future    Standing Expiration Date:   03/01/2023   CMP ([place] only)    Standing Status:   Future    Standing Expiration Date:   03/01/2023   All questions were answered. The patient knows to call the clinic with any problems, questions or concerns. No barriers to learning was detected. The total time spent in the appointment was 30 minutes.     Truitt Merle, MD 01/17/2022   Felicity Coyer, CMA, am acting as scribe for Truitt Merle, MD.   I have reviewed the above documentation for accuracy and completeness, and I agree with the above.

## 2022-01-16 NOTE — Assessment & Plan Note (Signed)
stage III, [id number], stage III, MMR normal -diagnosed 10/18/21 by EGD for 6 month f/u of gastric ulcers. Procedure showed mucosal intramucosal adenocarcinoma. MMR normal. -baseline CEA 11/01/21 significantly elevated at 1,989.58. -he met Dr. Kieth Brightly on 11/05/21. -EUS on 11/06/21 by Dr. Paulita Fujita, staged as T3 N3 (at least stage III) -staging CT CAP 11/08/21 showed: stable gastric antrum wall thickening; increased size of upper abdominal lymph nodes; stable left adrenal nodule, 1.9 cm. -he started neoadjuvant FLOT on 11/17.  -PET scan showed FDG avid lymph nodes within the gastrohepatic ligament, portacaval region, and mesentery. Tracer avid left supraclavicular lymph node and indeterminate left adrenal gland nodule with mild FDG uptake. -he is scheduled for [facility] node biopsy on 1/17

## 2022-01-17 ENCOUNTER — Encounter: Payer: Self-pay | Admitting: Physician Assistant

## 2022-01-17 ENCOUNTER — Encounter: Payer: Self-pay | Admitting: Hematology

## 2022-01-17 ENCOUNTER — Inpatient Hospital Stay: Payer: BC Managed Care – PPO

## 2022-01-17 ENCOUNTER — Inpatient Hospital Stay: Payer: BC Managed Care – PPO | Admitting: Dietician

## 2022-01-17 ENCOUNTER — Other Ambulatory Visit (HOSPITAL_COMMUNITY): Payer: Self-pay

## 2022-01-17 ENCOUNTER — Other Ambulatory Visit: Payer: Self-pay

## 2022-01-17 ENCOUNTER — Inpatient Hospital Stay: Payer: BC Managed Care – PPO | Attending: Physician Assistant

## 2022-01-17 ENCOUNTER — Inpatient Hospital Stay (HOSPITAL_BASED_OUTPATIENT_CLINIC_OR_DEPARTMENT_OTHER): Payer: BC Managed Care – PPO | Admitting: Hematology

## 2022-01-17 VITALS — BP 104/72 | HR 92 | Temp 98.6°F | Resp 15 | Wt 182.7 lb

## 2022-01-17 DIAGNOSIS — Z5111 Encounter for antineoplastic chemotherapy: Secondary | ICD-10-CM | POA: Insufficient documentation

## 2022-01-17 DIAGNOSIS — Z8619 Personal history of other infectious and parasitic diseases: Secondary | ICD-10-CM | POA: Insufficient documentation

## 2022-01-17 DIAGNOSIS — Z5189 Encounter for other specified aftercare: Secondary | ICD-10-CM | POA: Insufficient documentation

## 2022-01-17 DIAGNOSIS — R970 Elevated carcinoembryonic antigen [CEA]: Secondary | ICD-10-CM | POA: Diagnosis not present

## 2022-01-17 DIAGNOSIS — C163 Malignant neoplasm of pyloric antrum: Secondary | ICD-10-CM

## 2022-01-17 DIAGNOSIS — F17210 Nicotine dependence, cigarettes, uncomplicated: Secondary | ICD-10-CM | POA: Insufficient documentation

## 2022-01-17 DIAGNOSIS — Z8711 Personal history of peptic ulcer disease: Secondary | ICD-10-CM | POA: Insufficient documentation

## 2022-01-17 DIAGNOSIS — Z95828 Presence of other vascular implants and grafts: Secondary | ICD-10-CM | POA: Insufficient documentation

## 2022-01-17 LAB — CMP (CANCER CENTER ONLY)
ALT: 18 U/L (ref 0–44)
AST: 15 U/L (ref 15–41)
Albumin: 3.5 g/dL (ref 3.5–5.0)
Alkaline Phosphatase: 90 U/L (ref 38–126)
Anion gap: 6 (ref 5–15)
BUN: 12 mg/dL (ref 6–20)
CO2: 29 mmol/L (ref 22–32)
Calcium: 9.1 mg/dL (ref 8.9–10.3)
Chloride: 103 mmol/L (ref 98–111)
Creatinine: 0.75 mg/dL (ref 0.61–1.24)
GFR, Estimated: >60 mL/min (ref >60)
Glucose, Bld: 109 mg/dL — ABNORMAL HIGH (ref 70–99)
Potassium: 3.8 mmol/L (ref 3.5–5.1)
Sodium: 138 mmol/L (ref 135–145)
Total Bilirubin: 0.2 mg/dL — ABNORMAL LOW (ref 0.3–1.2)
Total Protein: 6 g/dL — ABNORMAL LOW (ref 6.5–8.1)

## 2022-01-17 LAB — CBC WITH DIFFERENTIAL (CANCER CENTER ONLY)
Abs Immature Granulocytes: 1.93 10*3/uL — ABNORMAL HIGH (ref 0.00–0.07)
Basophils Absolute: 0.2 10*3/uL — ABNORMAL HIGH (ref 0.0–0.1)
Basophils Relative: 1 %
Eosinophils Absolute: 0 10*3/uL (ref 0.0–0.5)
Eosinophils Relative: 0 %
HCT: 32.4 % — ABNORMAL LOW (ref 39.0–52.0)
Hemoglobin: 10.8 g/dL — ABNORMAL LOW (ref 13.0–17.0)
Immature Granulocytes: 9 %
Lymphocytes Relative: 20 %
Lymphs Abs: 4.4 10*3/uL — ABNORMAL HIGH (ref 0.7–4.0)
MCH: 29 pg (ref 26.0–34.0)
MCHC: 33.3 g/dL (ref 30.0–36.0)
MCV: 87.1 fL (ref 80.0–100.0)
Monocytes Absolute: 1.6 10*3/uL — ABNORMAL HIGH (ref 0.1–1.0)
Monocytes Relative: 7 %
Neutro Abs: 14 10*3/uL — ABNORMAL HIGH (ref 1.7–7.7)
Neutrophils Relative %: 63 %
Platelet Count: 202 10*3/uL (ref 150–400)
RBC: 3.72 MIL/uL — ABNORMAL LOW (ref 4.22–5.81)
RDW: 18.6 % — ABNORMAL HIGH (ref 11.5–15.5)
WBC Count: 22.1 10*3/uL — ABNORMAL HIGH (ref 4.0–10.5)
nRBC: 0.5 % — ABNORMAL HIGH (ref 0.0–0.2)

## 2022-01-17 MED ORDER — SODIUM CHLORIDE 0.9 % IV SOLN
2575.0000 mg/m2 | INTRAVENOUS | Status: DC
  Administered 2022-01-17: 5000 mg via INTRAVENOUS
  Filled 2022-01-17: qty 100

## 2022-01-17 MED ORDER — OXALIPLATIN CHEMO INJECTION 100 MG/20ML
85.0000 mg/m2 | Freq: Once | INTRAVENOUS | Status: AC
  Administered 2022-01-17: 165 mg via INTRAVENOUS
  Filled 2022-01-17: qty 33

## 2022-01-17 MED ORDER — SODIUM CHLORIDE 0.9% FLUSH
10.0000 mL | Freq: Once | INTRAVENOUS | Status: AC
  Administered 2022-01-17: 10 mL

## 2022-01-17 MED ORDER — SODIUM CHLORIDE 0.9 % IV SOLN
50.0000 mg/m2 | Freq: Once | INTRAVENOUS | Status: AC
  Administered 2022-01-17: 100 mg via INTRAVENOUS
  Filled 2022-01-17: qty 10

## 2022-01-17 MED ORDER — SODIUM CHLORIDE 0.9 % IV SOLN
10.0000 mg | Freq: Once | INTRAVENOUS | Status: AC
  Administered 2022-01-17: 10 mg via INTRAVENOUS
  Filled 2022-01-17: qty 10

## 2022-01-17 MED ORDER — DEXTROSE 5 % IV SOLN: Freq: Once | INTRAVENOUS | Status: AC

## 2022-01-17 MED ORDER — SODIUM CHLORIDE 0.9% FLUSH: 10.0000 mL | INTRAVENOUS | Status: DC | PRN

## 2022-01-17 MED ORDER — LEUCOVORIN CALCIUM INJECTION 350 MG
200.0000 mg/m2 | Freq: Once | INTRAVENOUS | Status: AC
  Administered 2022-01-17: 390 mg via INTRAVENOUS
  Filled 2022-01-17: qty 19.5

## 2022-01-17 MED ORDER — PALONOSETRON HCL INJECTION 0.25 MG/5ML
0.2500 mg | Freq: Once | INTRAVENOUS | Status: AC
  Administered 2022-01-17: 0.25 mg via INTRAVENOUS
  Filled 2022-01-17: qty 5

## 2022-01-17 NOTE — Patient Instructions (Signed)
[facility] ONCOLOGY  Discharge Instructions: Thank you for choosing [facility] to provide your oncology and hematology care.   If you have a lab appointment with the [facility], please go directly to the [facility] and check in at the registration area.   Wear comfortable clothing and clothing appropriate for easy access to any Portacath or PICC line.   We strive to give you quality time with your provider. You may need to reschedule your appointment if you arrive late (15 or more minutes).  Arriving late affects you and other patients whose appointments are after yours.  Also, if you miss three or more appointments without notifying the office, you may be dismissed from the clinic at the provider's discretion.      For prescription refill requests, have your pharmacy contact our office and allow 72 hours for refills to be completed.    Today you received the following chemotherapy and/or immunotherapy agents: docetaxel, oxaliplatin, leucovorin, fluorouracil      To help prevent nausea and vomiting after your treatment, we encourage you to take your nausea medication as directed.  BELOW ARE SYMPTOMS THAT SHOULD BE REPORTED IMMEDIATELY: *FEVER GREATER THAN 100.4 F (38 C) OR HIGHER *CHILLS OR SWEATING *NAUSEA AND VOMITING THAT IS NOT CONTROLLED WITH YOUR NAUSEA MEDICATION *UNUSUAL SHORTNESS OF BREATH *UNUSUAL BRUISING OR BLEEDING *URINARY PROBLEMS (pain or burning when urinating, or frequent urination) *BOWEL PROBLEMS (unusual diarrhea, constipation, pain near the anus) TENDERNESS IN MOUTH AND THROAT WITH OR WITHOUT PRESENCE OF ULCERS (sore throat, sores in mouth, or a toothache) UNUSUAL RASH, SWELLING OR PAIN  UNUSUAL VAGINAL DISCHARGE OR ITCHING   Items with * indicate a potential emergency and should be followed up as soon as possible or go to the Emergency Department if any problems should occur.  Please show the CHEMOTHERAPY ALERT CARD or  IMMUNOTHERAPY ALERT CARD at check-in to the Emergency Department and triage nurse.  Should you have questions after your visit or need to cancel or reschedule your appointment, please contact [facility]  Dept: [phone]  and follow the prompts.  Office hours are 8:00 a.m. to 4:30 p.m. Monday - Friday. Please note that voicemails left after 4:00 p.m. may not be returned until the following business day.  We are closed weekends and major holidays. You have access to a nurse at all times for urgent questions. Please call the main number to the clinic Dept: [phone] and follow the prompts.   For any non-urgent questions, you may also contact your provider using MyChart. We now offer e-Visits for anyone 85 and older to request care online for non-urgent symptoms. For details visit [email or web address].   Also download the MyChart app! Go to the app store, search "MyChart", open the app, select [facility], and log in with your MyChart username and password.

## 2022-01-17 NOTE — Progress Notes (Signed)
Confirmed Docetaxel dose (50 mg/m2) w/ Dr. Burr Medico.  Kennith Center, Pharm.D., CPP 01/17/2022'@9'$ :38 AM

## 2022-01-17 NOTE — Progress Notes (Signed)
Nutrition Follow-up:  Patient with gastric cancer. He is receiving FLOT q14d  Met with patient during infusion. He reports doing great. Patient has a good appetite and eating well despite taste changes. Patient reports unable to taste frosted flakes or sweet flavors. He is able to taste pineapple and lemon. Patient reports diarrhea has improved. This last one day following treatment. He is managing symptoms well with antidiarrheal.    Medications: reviewed   Labs: reviewed   Anthropometrics: Wt 182 lb 11.2 oz today   12/29 - 182 lb 8 oz  12/1 - 179 lb 3.2 oz    NUTRITION DIAGNOSIS:  Unintentional weight loss stable   INTERVENTION:  Educated on strategies for altered taste as well as importance of oral care. Suggested trying baking soda salt water rinses several times daily before meals - handout with tips + recipe provided Continue antidiarrheal as needed per MD    MONITORING, EVALUATION, GOAL: weight trends, intake    NEXT VISIT: To be scheduled as needed

## 2022-01-18 ENCOUNTER — Inpatient Hospital Stay: Payer: BC Managed Care – PPO

## 2022-01-18 ENCOUNTER — Encounter: Payer: Self-pay | Admitting: Hematology

## 2022-01-18 ENCOUNTER — Other Ambulatory Visit (HOSPITAL_COMMUNITY): Payer: Self-pay

## 2022-01-18 ENCOUNTER — Other Ambulatory Visit: Payer: Self-pay

## 2022-01-18 VITALS — BP 123/80 | HR 109 | Temp 100.0°F | Resp 16

## 2022-01-18 DIAGNOSIS — Z5111 Encounter for antineoplastic chemotherapy: Secondary | ICD-10-CM | POA: Diagnosis not present

## 2022-01-18 DIAGNOSIS — C163 Malignant neoplasm of pyloric antrum: Secondary | ICD-10-CM

## 2022-01-18 MED ORDER — PEGFILGRASTIM-JMDB 6 MG/0.6ML ~~LOC~~ SOSY
6.0000 mg | PREFILLED_SYRINGE | Freq: Once | SUBCUTANEOUS | Status: AC
  Administered 2022-01-18: 6 mg via SUBCUTANEOUS

## 2022-01-18 MED ORDER — HEPARIN SOD (PORK) LOCK FLUSH 100 UNIT/ML IV SOLN
500.0000 [IU] | Freq: Once | INTRAVENOUS | Status: AC | PRN
  Administered 2022-01-18: 500 [IU]

## 2022-01-18 MED ORDER — SODIUM CHLORIDE 0.9% FLUSH
10.0000 mL | INTRAVENOUS | Status: DC | PRN
  Administered 2022-01-18: 10 mL

## 2022-01-21 ENCOUNTER — Other Ambulatory Visit: Payer: Self-pay | Admitting: Radiology

## 2022-01-21 ENCOUNTER — Other Ambulatory Visit: Payer: Self-pay | Admitting: Internal Medicine

## 2022-01-21 DIAGNOSIS — C169 Malignant neoplasm of stomach, unspecified: Secondary | ICD-10-CM

## 2022-01-22 ENCOUNTER — Other Ambulatory Visit: Payer: Self-pay

## 2022-01-22 ENCOUNTER — Ambulatory Visit (HOSPITAL_COMMUNITY)
Admission: RE | Admit: 2022-01-22 | Discharge: 2022-01-22 | Disposition: A | Discharge status: Home / Self Care | Payer: BC Managed Care – PPO | Source: Ambulatory Visit | Attending: Hematology | Admitting: Hematology

## 2022-01-22 ENCOUNTER — Other Ambulatory Visit (HOSPITAL_COMMUNITY): Payer: Self-pay

## 2022-01-22 ENCOUNTER — Encounter (HOSPITAL_COMMUNITY): Payer: Self-pay

## 2022-01-22 DIAGNOSIS — C163 Malignant neoplasm of pyloric antrum: Secondary | ICD-10-CM

## 2022-01-22 DIAGNOSIS — C169 Malignant neoplasm of stomach, unspecified: Secondary | ICD-10-CM | POA: Diagnosis not present

## 2022-01-22 DIAGNOSIS — R590 Localized enlarged lymph nodes: Secondary | ICD-10-CM | POA: Diagnosis not present

## 2022-01-22 MED ORDER — FENTANYL CITRATE (PF) 100 MCG/2ML IJ SOLN
INTRAMUSCULAR | Status: AC
  Filled 2022-01-22: qty 2

## 2022-01-22 MED ORDER — FENTANYL CITRATE (PF) 100 MCG/2ML IJ SOLN
INTRAMUSCULAR | Status: AC | PRN
  Administered 2022-01-22: 25 ug via INTRAVENOUS
  Administered 2022-01-22: 50 ug via INTRAVENOUS

## 2022-01-22 MED ORDER — MIDAZOLAM HCL 2 MG/2ML IJ SOLN
INTRAMUSCULAR | Status: AC | PRN
  Administered 2022-01-22: 1 mg via INTRAVENOUS
  Administered 2022-01-22: .5 mg via INTRAVENOUS

## 2022-01-22 MED ORDER — MIDAZOLAM HCL 2 MG/2ML IJ SOLN
INTRAMUSCULAR | Status: AC
  Filled 2022-01-22: qty 2

## 2022-01-22 MED ORDER — LIDOCAINE HCL 1 % IJ SOLN
INTRAMUSCULAR | Status: AC
  Administered 2022-01-22: 10 mL
  Filled 2022-01-22: qty 20

## 2022-01-22 MED ORDER — SODIUM CHLORIDE 0.9 % IV SOLN: INTRAVENOUS | Status: DC

## 2022-01-22 NOTE — Sedation Documentation (Signed)
Sample #1 obtained

## 2022-01-22 NOTE — Sedation Documentation (Signed)
Sample #3 obtained

## 2022-01-22 NOTE — Discharge Instructions (Signed)
Moderate Conscious Sedation, Adult, Care After This sheet gives you information about how to care for yourself after your procedure. Your health care provider may also give you more specific instructions. If you have problems or questions, contact your health care provider. What can I expect after the procedure? After the procedure, it is common to have: Sleepiness for several hours. Impaired judgment for several hours. Difficulty with balance. Vomiting if you eat too soon. Follow these instructions at home: For the time period you were told by your health care provider:     Rest. Do not participate in activities where you could fall or become injured. Do not drive or use machinery. Do not drink alcohol. Do not take sleeping pills or medicines that cause drowsiness. Do not make important decisions or sign legal documents. Do not take care of children on your own. Eating and drinking  Follow the diet recommended by your health care provider. Drink enough fluid to keep your urine pale yellow. If you vomit: Drink water, juice, or soup when you can drink without vomiting. Make sure you have little or no nausea before eating solid foods. General instructions Take over-the-counter and prescription medicines only as told by your health care provider. Have a responsible adult stay with you for the time you are told. It is important to have someone help care for you until you are awake and alert. Do not smoke. Keep all follow-up visits as told by your health care provider. This is important. Contact a health care provider if: You are still sleepy or having trouble with balance after 24 hours. You feel light-headed. You keep feeling nauseous or you keep vomiting. You develop a rash. You have a fever. You have redness or swelling around the IV site. Get help right away if: You have trouble breathing. You have new-onset confusion at home. Summary After the procedure, it is common to  feel sleepy, have impaired judgment, or feel nauseous if you eat too soon. Rest after you get home. Know the things you should not do after the procedure. Follow the diet recommended by your health care provider and drink enough fluid to keep your urine pale yellow. Get help right away if you have trouble breathing or new-onset confusion at home. This information is not intended to replace advice given to you by your health care provider. Make sure you discuss any questions you have with your health care provider. Document Revised: 04/22/2019 Document Reviewed: 11/18/2018 Elsevier Patient Education  [place].     Needle Biopsy, Care After This sheet gives you information about how to care for yourself after your procedure. Your health care provider may also give you more specific instructions. If you have problems or questions, contact your health careprovider. What can I expect after the procedure? After the procedure, it is common to have soreness, bruising, or mild pain atthe puncture site. This should go away in a few days. Follow these instructions at home: Needle insertion site care Wash your hands with soap and water before you change your bandage (dressing). If you cannot use soap and water, use hand sanitizer. Follow instructions from your health care provider about how to take care of your puncture site. This includes: When and how to change your dressing. When to remove your dressing. Check your puncture site every day for signs of infection. Check for: Redness, swelling, or pain. Fluid or blood. Pus or a bad smell. Warmth.   General instructions Return to your normal activities as  told by your health care provider. Ask your health care provider what activities are safe for you. Do not take baths, swim, or use a hot tub until your health care provider approves. Ask your health care provider if you may take showers. You may only be allowed to take sponge baths. Take  over-the-counter and prescription medicines only as told by your health care provider. Keep all follow-up visits as told by your health care provider. This is important. Contact a health care provider if: You have a fever. You have redness, swelling, or pain at the puncture site that lasts longer than a few days. You have fluid, blood, or pus coming from your puncture site. Your puncture site feels warm to the touch. Get help right away if: You have severe bleeding from the puncture site. Summary After the procedure, it is common to have soreness, bruising, or mild pain at the puncture site. This should go away in a few days. Check your puncture site every day for signs of infection, such as redness, swelling, or pain. Get help right away if you have severe bleeding from your puncture site. This information is not intended to replace advice given to you by your health care provider. Make sure you discuss any questions you have with your healthcare provider. Document Revised: 06/23/2019 Document Reviewed: 06/23/2019 Elsevier Patient Education  [place].

## 2022-01-22 NOTE — H&P (Signed)
Chief Complaint: Patient was seen in consultation today for gastric adenocarcinoma and lymphadenopathy at the request of Jonathan Maynard  Referring Physician(s): Jonathan Maynard  Supervising Physician: Jonathan Maynard  Patient Status: [facility] - Out-pt  History of Present Illness: Jonathan Maynard is a 52 y.o. male with PMH significant for gastric cancer underwent PET scan that showed avid lymph nodes within the gastrohepatic ligament, portacaval region, mesentery, left supraclavicular lymph node and indeterminate left adrenal gland nodule. Pt is followed and managed by oncology since his diagnosis 10/2021. He has been referred to IR for biopsy of left supraclavicular lymph node. Imaging was reviewed and approved by Dr. Vernard Gambles.    Pt denies fever, chills, CP, SOB, N/V, fatigue or weakness.  Last food/drink was 0603 this morning.   Past Medical History:  Diagnosis Date   Acute upper GI bleed 04/12/2021   Class 1 obesity 04/12/2021   Diverticulosis 04/12/2021   Hepatic steatosis 04/12/2021    Past Surgical History:  Procedure Laterality Date   BIOPSY  04/12/2021   Procedure: BIOPSY;  Surgeon: Wilford Corner, MD;  Location: [facility];  Service: Gastroenterology;;   ESOPHAGOGASTRODUODENOSCOPY N/A 04/12/2021   Procedure: ESOPHAGOGASTRODUODENOSCOPY (EGD);  Surgeon: Wilford Corner, MD;  Location: [facility];  Service: Gastroenterology;  Laterality: N/A;   ESOPHAGOGASTRODUODENOSCOPY N/A 11/06/2021   Procedure: ESOPHAGOGASTRODUODENOSCOPY (EGD);  Surgeon: Arta Silence, MD;  Location: [facility];  Service: Gastroenterology;  Laterality: N/A;   HERNIA REPAIR     IR IMAGING GUIDED PORT INSERTION  11/15/2021   UPPER ESOPHAGEAL ENDOSCOPIC ULTRASOUND (EUS) Bilateral 11/06/2021   Procedure: UPPER ESOPHAGEAL ENDOSCOPIC ULTRASOUND (EUS);  Surgeon: Arta Silence, MD;  Location: [facility];  Service: Gastroenterology;  Laterality: Bilateral;    Allergies: Patient has no known  allergies.  Medications: Prior to Admission medications   Medication Sig Start Date End Date Taking? Authorizing Provider  dexamethasone (DECADRON) 4 MG tablet Take 1 tablet by mouth twice a day the day before chemo.  Then take 1 tablet by mouth once daily for 2 days after chemo. 01/03/22   Truitt Merle, MD  ferrous sulfate 325 (65 FE) MG tablet Take 1 tablet (325 mg total) by mouth daily. 04/16/21 04/16/22  Elodia Florence., MD  lidocaine-prilocaine (EMLA) cream Apply to affected area as directed once Patient not taking: Reported on 01/03/2022 11/11/21   Truitt Merle, MD  ondansetron (ZOFRAN) 8 MG tablet Take 1 tablet (8 mg total) by mouth every 8 (eight) hours as needed for nausea or vomiting. Start on the third day after chemotherapy. 11/11/21   Truitt Merle, MD  pantoprazole (PROTONIX) 40 MG tablet Take 1 tablet (40 mg total) by mouth 2 (two) times daily. 10/23/21     prochlorperazine (COMPAZINE) 10 MG tablet Take 1 tablet (10 mg total) by mouth every 6 (six) hours as needed for nausea or vomiting. 01/03/22   Truitt Merle, MD     Family History  Problem Relation Age of Onset   Diabetes Mother     Social History   Socioeconomic History   Marital status: Married    Spouse name: Not on file   Number of children: Not on file   Years of education: Not on file   Highest education level: Not on file  Occupational History   Not on file  Tobacco Use   Smoking status: Former    Packs/day: 0.50    Years: 35.00    Total pack years: 17.50    Types: Cigarettes   Smokeless tobacco: Never  Vaping Use  Vaping Use: Never used  Substance and Sexual Activity   Alcohol use: Not Currently   Drug use: Never   Sexual activity: Not on file  Other Topics Concern   Not on file  Social History Narrative   Not on file   Social Determinants of Health   Financial Resource Strain: Not on file  Food Insecurity: Not on file  Transportation Needs: Not on file  Physical Activity: Not on file  Stress: Not  on file  Social Connections: Not on file     Review of Systems: A 12 point ROS discussed and pertinent positives are indicated in the HPI above.  All other systems are negative.  Review of Systems  All other systems reviewed and are negative.   Vital Signs: BP 108/77   Pulse 80   Temp 98.6 F (37 C) (Oral)   Resp 18   SpO2 100%     Physical Exam Vitals reviewed.  Constitutional:      General: He is not in acute distress.    Appearance: Normal appearance. He is not ill-appearing.  HENT:     Head: Normocephalic and atraumatic.     Mouth/Throat:     Mouth: Mucous membranes are dry.     Pharynx: Oropharynx is clear.  Cardiovascular:     Rate and Rhythm: Normal rate and regular rhythm.     Pulses: Normal pulses.     Heart sounds: Normal heart sounds. No murmur heard. Pulmonary:     Effort: Pulmonary effort is normal. No respiratory distress.     Breath sounds: Normal breath sounds.  Abdominal:     General: Bowel sounds are normal. There is no distension.     Palpations: Abdomen is soft.     Tenderness: There is no abdominal tenderness. There is no guarding.  Musculoskeletal:     Right lower leg: No edema.     Left lower leg: No edema.  Skin:    General: Skin is warm and dry.  Neurological:     Mental Status: He is alert and oriented to person, place, and time.  Psychiatric:        Mood and Affect: Mood normal.        Behavior: Behavior normal.        Thought Content: Thought content normal.        Judgment: Judgment normal.     Imaging: No results found.  Labs:  CBC: Recent Labs    12/06/21 0745 12/20/21 0739 01/03/22 0832 01/17/22 0817  WBC 10.6* 29.2* 18.9* 22.1*  HGB 9.0* 10.3* 9.8* 10.8*  HCT 27.0* 30.3* 29.8* 32.4*  PLT 138* 205 199 202    COAGS: No results for input(s): "INR", "APTT" in the last 8760 hours.  BMP: Recent Labs    12/06/21 0745 12/20/21 0739 01/03/22 0832 01/17/22 0817  NA 136 137 139 138  K 3.7 3.7 3.6 3.8  CL 109  104 105 103  CO2 '23 27 29 29  '$ GLUCOSE 121* 117* 131* 109*  BUN '12 11 13 12  '$ CALCIUM 8.6* 9.3 8.9 9.1  CREATININE 0.73 0.79 0.76 0.75  GFRNONAA >60 >60 >60 >60    LIVER FUNCTION TESTS: Recent Labs    12/06/21 0745 12/20/21 0739 01/03/22 0832 01/17/22 0817  BILITOT 0.3 0.2* 0.2* 0.2*  AST 12* 13* 11* 15  ALT '9 12 12 18  '$ ALKPHOS 72 89 78 90  PROT 6.6 6.0* 6.0* 6.0*  ALBUMIN 3.5 3.7 3.3* 3.5    TUMOR MARKERS: Recent  Labs    11/01/21 1512  CEA 1,989.58*    Assessment and Plan:  52 yo male with gastric cancer presents to IR for left supraclavicular lymph node biopsy.   Pt resting on stretcher with RN at bedside.  He is A&O, calm and pleasant.  He is in no distress.  Risks and benefits of left supraclavicular lymph node biopsy with moderate sedation was discussed with the patient and/or patient's family including, but not limited to bleeding, infection, damage to adjacent structures or low yield requiring additional tests.  All of the questions were answered and there is agreement to proceed.  Consent signed and in chart. Thank you for this interesting consult.  I greatly enjoyed meeting Rustin Erhart and look forward to participating in their care.  A copy of this report was sent to the requesting provider on this date.  Electronically Signed: Tyson Alias, NP 01/22/2022, 11:41 AM   I spent a total of 20 minutes in face to face in clinical consultation, greater than 50% of which was counseling/coordinating care for gastric adenocarcinoma, lymphadenopathy.

## 2022-01-22 NOTE — Procedures (Signed)
Pre Procedure Dx: Gastric cancer, hypermetabolic left cervical lymph node Post Procedural Dx: Same  Technically successful US guided biopsy of small left cervical lymph node.  EBL: Trace No immediate complications.   Ronny Bacon, MD Pager #: [phone]

## 2022-01-22 NOTE — Sedation Documentation (Signed)
Sample #2 obtained

## 2022-01-23 LAB — SURGICAL PATHOLOGY

## 2022-01-27 ENCOUNTER — Other Ambulatory Visit: Payer: Self-pay

## 2022-01-27 ENCOUNTER — Other Ambulatory Visit: Payer: Self-pay | Admitting: Hematology

## 2022-01-27 DIAGNOSIS — C163 Malignant neoplasm of pyloric antrum: Secondary | ICD-10-CM

## 2022-01-30 MED FILL — Dexamethasone Sodium Phosphate Inj 100 MG/10ML: INTRAMUSCULAR | Qty: 1 | Status: AC

## 2022-01-30 NOTE — Progress Notes (Signed)
[place]   Telephone:(336) [phone] Fax:(336) [phone]   Clinic Follow up Note   Patient Care Team: Pcp, No as PCP - General Truitt Merle, MD as Consulting Physician (Hematology and Oncology)  Date of Service:  01/31/2022  CHIEF COMPLAINT: f/u of  gastric cancer      CURRENT THERAPY: Neoadjuvant FLOT, q14d, starting 11/22/21      ASSESSMENT:  Jonathan Maynard is a 52 y.o. male with   Gastric cancer ([place]) stage III, cT3N3M0, stage III, MMR normal -diagnosed 10/18/21 by EGD for 6 month f/u of gastric ulcers. Procedure showed mucosal intramucosal adenocarcinoma. MMR normal. -baseline CEA 11/01/21 significantly elevated at 1,989.58. -he met Dr. Kieth Brightly on 11/05/21. -EUS on 11/06/21 by Dr. Paulita Fujita, staged as T3 N3 (at least stage III) -staging CT CAP 11/08/21 showed: stable gastric antrum wall thickening; increased size of upper abdominal lymph nodes; stable left adrenal nodule, 1.9 cm. -he started neoadjuvant FLOT on 11/17.  -PET scan showed FDG avid lymph nodes within the gastrohepatic ligament, portacaval region, and mesentery. Tracer avid left supraclavicular lymph node and indeterminate left adrenal gland nodule with mild FDG uptake. -he underwent [place] node biopsy on 1/17 which was negative for malignant cells, but no lymphoid tissue seen on biopsy  -he is tolerating chemo FLOT well, will continue -will repeat PET scan to evaluate response, and may consider repeat left supraclavicular lymph node biopsy if still positive on PET scan. -I will also request PD-L1 and NexGen or sequencing if biopsy confirmed metastatic disease.    PLAN: - Discussed biopsy from 1/17 - Pet scan 2/2 is approved and CT scan scheduled on 1/31 will be cancelled - proceed with C6 FLOT today and continue every 2 weeks  -phone visit on 2/5 to review PET  - lab f/u chemo 2-9    SUMMARY OF ONCOLOGIC HISTORY: Oncology History Overview Note   Cancer Staging  Gastric cancer  [facility]) Staging form: Stomach, AJCC 8th Edition - Clinical stage from 10/18/2021: Stage III (cT3, cN3a, cM0) - Signed by Truitt Merle, MD on 11/10/2021     Gastric cancer ([place])  10/18/2021 Procedure   EGD performed under the care of Dr. Michail Sermon  Findings:  -A large, ulcerated, partially circumferential (involving two thirds of the lumen circumference) mass with no bleeding and stigmata of recent bleeding was found in the gastric antrum, at the pylorus and in the prepyloric region of the stomach. -Segmental severe inflammation characterized by congestion (edema) and erythema was found in the gastric antrum.   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 Negative for dysplasia.  Negative for helicobacter pylori  Mismatch Repair (MMR) Protein Imunohistochemistry (IHC): IHC Expression Result: MLH1: Preserved nuclear expression. MSH2: Preserved nuclear expression. MSH6: Preserved nuclear expression. PMS2: Preserved nuclear expression. Interpretation: NORMAL   10/31/2021 Initial Diagnosis   Gastric cancer ([place])   11/01/2021 Tumor Marker   CEA = 8,786.76 (^)   11/06/2021 Procedure   Upper EUS, Dr. Paulita Fujita  Impression: - Normal esophagus. - A small amount of food (residue) in the stomach. - Congested, friable (with contact bleeding) and ulcerated mucosa in the prepyloric region of the stomach. -  Normal examined duodenum. - There was no evidence of significant pathology in the left lobe of the liver. - Many abnormal lymph nodes (over 10) were visualized in the  gastrohepatic ligament (level 18), celiac region (level 20), perigastric region, peripancreatic region and aortocaval region. - Wall thickening was seen in the prepyloric region of the stomach. The thickening appeared to be primarily within the deep mucosa (Layer 2) but extended through the muscularis propria. - Overall constellation of findings consistent with at least T3 N3 Mx (at least stage III) gastric adenocarcinoma.   11/08/2021 Imaging   EXAM: CT CHEST, ABDOMEN, AND PELVIS WITH CONTRAST  IMPRESSION: 1. Similar irregular wall thickening of the gastric antrum, consistent with patient's known primary gastric neoplasm. 2. Increased size of the upper abdominal lymph nodes, concerning for worsening nodal disease involvement. 3. Stable left adrenal nodule common nonspecific possibly a metastatic lesion or adenoma. 4. No evidence of metastatic disease in the chest. 5. Left-sided colonic diverticulosis without findings of acute diverticulitis. 6. Enlarged prostate gland. 7.  Aortic Atherosclerosis (ICD10-I70.0).   11/22/2021 -  Chemotherapy   Patient is on Treatment Plan : GASTROESOPHAGEAL FLOT q14d X 4 cycles     11/27/2021 Imaging    IMPRESSION: 1. The mass within the gastric antrum is mildly decreased in size when compared with 11/08/2021. There is persistent FDG uptake associated with this mass compatible with residual tumor. 2. Persistent FDG avid lymph nodes within the gastrohepatic ligament, portacaval region, and mesentery. These are mildly decreased in size when compared with 11/08/2021. 3. Tracer avid left supraclavicular lymph node is also mildly decreased in size when compared with 11/08/2021. 4. Indeterminate left adrenal gland nodule with mild FDG uptake. This may represent a lipid poor adenoma. Metastatic disease not exclude. 5. Diffuse increased uptake throughout the bone marrow is favored to represent treatment related change. 6.  Aortic Atherosclerosis  (ICD10-I70.0).        INTERVAL HISTORY:  Jonathan Maynard is here for a follow up of gastric cancer    He was last seen by me on 01/17/2022 He presents to the clinic accompanied by his daughter, Pt has no issues at this time.  All other systems were reviewed with the patient and are negative.  MEDICAL HISTORY:  Past Medical History:  Diagnosis Date   Acute upper GI bleed 04/12/2021   Class 1 obesity 04/12/2021   Diverticulosis 04/12/2021   Hepatic steatosis 04/12/2021    SURGICAL HISTORY: Past Surgical History:  Procedure Laterality Date   BIOPSY  04/12/2021   Procedure: BIOPSY;  Surgeon: Wilford Corner, MD;  Location: [facility];  Service: Gastroenterology;;   ESOPHAGOGASTRODUODENOSCOPY N/A 04/12/2021   Procedure: ESOPHAGOGASTRODUODENOSCOPY (EGD);  Surgeon: Wilford Corner, MD;  Location: [facility];  Service: Gastroenterology;  Laterality: N/A;   ESOPHAGOGASTRODUODENOSCOPY N/A 11/06/2021   Procedure: ESOPHAGOGASTRODUODENOSCOPY (EGD);  Surgeon: Arta Silence, MD;  Location: [facility];  Service: Gastroenterology;  Laterality: N/A;   HERNIA REPAIR     IR IMAGING GUIDED PORT INSERTION  11/15/2021   UPPER ESOPHAGEAL ENDOSCOPIC ULTRASOUND (EUS) Bilateral 11/06/2021   Procedure: UPPER ESOPHAGEAL ENDOSCOPIC ULTRASOUND (EUS);  Surgeon: Arta Silence, MD;  Location: [facility];  Service: Gastroenterology;  Laterality: Bilateral;    I have reviewed the social history and family history with the patient and they are unchanged from previous note.  ALLERGIES:  has No Known Allergies.  MEDICATIONS:  Current Outpatient Medications  Medication Sig Dispense Refill   dexamethasone (DECADRON) 4 MG tablet Take 1 tablet by mouth twice a day  the day before chemo.  Then take 1 tablet by mouth once daily for 2 days after chemo. 30 tablet 1   ferrous sulfate 325 (65 FE) MG tablet Take 1 tablet (325 mg total) by mouth daily. 30 tablet 3   lidocaine-prilocaine (EMLA) cream Apply  to affected area as directed once (Patient not taking: Reported on 01/03/2022) 30 g 3   ondansetron (ZOFRAN) 8 MG tablet Take 1 tablet (8 mg total) by mouth every 8 (eight) hours as needed for nausea or vomiting. Start on the third day after chemotherapy. 30 tablet 1   pantoprazole (PROTONIX) 40 MG tablet Take 1 tablet (40 mg total) by mouth 2 (two) times daily. 60 tablet 3   prochlorperazine (COMPAZINE) 10 MG tablet Take 1 tablet (10 mg total) by mouth every 6 (six) hours as needed for nausea or vomiting. 30 tablet 1   No current facility-administered medications for this visit.   Facility-Administered Medications Ordered in Other Visits  Medication Dose Route Frequency Provider Last Rate Last Admin   DOCEtaxel (TAXOTERE) 100 mg in sodium chloride 0.9 % 250 mL chemo infusion  50 mg/m2 (Treatment Plan Recorded) Intravenous Once Truitt Merle, MD       fluorouracil (ADRUCIL) 5,000 mg in sodium chloride 0.9 % 150 mL chemo infusion  2,575 mg/m2 (Treatment Plan Recorded) Intravenous 1 day or 1 dose Truitt Merle, MD       heparin lock flush 100 unit/mL  500 Units Intracatheter Once PRN Truitt Merle, MD       leucovorin 390 mg in dextrose 5 % 250 mL infusion  200 mg/m2 (Treatment Plan Recorded) Intravenous Once Truitt Merle, MD       oxaliplatin (ELOXATIN) 165 mg in dextrose 5 % 500 mL chemo infusion  85 mg/m2 (Treatment Plan Recorded) Intravenous Once Truitt Merle, MD       sodium chloride flush (NS) 0.9 % injection 10 mL  10 mL Intracatheter PRN Truitt Merle, MD        PHYSICAL EXAMINATION: ECOG PERFORMANCE STATUS: 1 - Symptomatic but completely ambulatory  There were no vitals filed for this visit. Wt Readings from Last 3 Encounters:  01/31/22 177 lb 4 oz (80.4 kg)  01/17/22 182 lb 11.2 oz (82.9 kg)  01/03/22 182 lb 8 oz (82.8 kg)     GENERAL:alert, no distress and comfortable SKIN: skin color normal, no rashes or significant lesions EYES: normal, Conjunctiva are pink and non-injected, sclera clear  NEURO:  alert & oriented x 3 with fluent speech    LABORATORY DATA:  I have reviewed the data as listed    Latest Ref Rng & Units 01/31/2022    7:51 AM 01/17/2022    8:17 AM 01/03/2022    8:32 AM  CBC  WBC 4.0 - 10.5 K/uL 25.5  22.1  18.9   Hemoglobin 13.0 - 17.0 g/dL 10.7  10.8  9.8   Hematocrit 39.0 - 52.0 % 32.7  32.4  29.8   Platelets 150 - 400 K/uL 170  202  199         Latest Ref Rng & Units 01/31/2022    7:51 AM 01/17/2022    8:17 AM 01/03/2022    8:32 AM  CMP  Glucose 70 - 99 mg/dL 131  109  131   BUN 6 - 20 mg/dL '10  12  13   '$ Creatinine 0.61 - 1.24 mg/dL 0.89  0.75  0.76   Sodium 135 - 145 mmol/L 138  138  139  Potassium 3.5 - 5.1 mmol/L 3.7  3.8  3.6   Chloride 98 - 111 mmol/L 104  103  105   CO2 22 - 32 mmol/L '29  29  29   '$ Calcium 8.9 - 10.3 mg/dL 8.8  9.1  8.9   Total Protein 6.5 - 8.1 g/dL 5.9  6.0  6.0   Total Bilirubin 0.3 - 1.2 mg/dL 0.3  0.2  0.2   Alkaline Phos 38 - 126 U/L 92  90  78   AST 15 - 41 U/L '15  15  11   '$ ALT 0 - 44 U/L '17  18  12       '$ RADIOGRAPHIC STUDIES: I have personally reviewed the radiological images as listed and agreed with the findings in the report. No results found.    No orders of the defined types were placed in this encounter.  All questions were answered. The patient knows to call the clinic with any problems, questions or concerns. No barriers to learning was detected. The total time spent in the appointment was 30 minutes.     Truitt Merle, MD 01/31/2022   I, Maurine Simmering, CMA, am acting as scribe for Truitt Merle, MD.   I have reviewed the above documentation for accuracy and completeness, and I agree with the above.

## 2022-01-31 ENCOUNTER — Encounter: Payer: Self-pay | Admitting: Hematology

## 2022-01-31 ENCOUNTER — Inpatient Hospital Stay: Payer: BC Managed Care – PPO

## 2022-01-31 ENCOUNTER — Inpatient Hospital Stay (HOSPITAL_BASED_OUTPATIENT_CLINIC_OR_DEPARTMENT_OTHER): Payer: BC Managed Care – PPO | Admitting: Hematology

## 2022-01-31 VITALS — BP 109/79 | HR 90 | Temp 98.9°F | Resp 17 | Wt 177.2 lb

## 2022-01-31 DIAGNOSIS — C163 Malignant neoplasm of pyloric antrum: Secondary | ICD-10-CM | POA: Diagnosis not present

## 2022-01-31 DIAGNOSIS — Z5111 Encounter for antineoplastic chemotherapy: Secondary | ICD-10-CM | POA: Diagnosis not present

## 2022-01-31 DIAGNOSIS — Z95828 Presence of other vascular implants and grafts: Secondary | ICD-10-CM

## 2022-01-31 LAB — CBC WITH DIFFERENTIAL (CANCER CENTER ONLY)
Abs Immature Granulocytes: 1.79 10*3/uL — ABNORMAL HIGH (ref 0.00–0.07)
Basophils Absolute: 0.1 10*3/uL (ref 0.0–0.1)
Basophils Relative: 1 %
Eosinophils Absolute: 0 10*3/uL (ref 0.0–0.5)
Eosinophils Relative: 0 %
HCT: 32.7 % — ABNORMAL LOW (ref 39.0–52.0)
Hemoglobin: 10.7 g/dL — ABNORMAL LOW (ref 13.0–17.0)
Immature Granulocytes: 7 %
Lymphocytes Relative: 17 %
Lymphs Abs: 4.3 10*3/uL — ABNORMAL HIGH (ref 0.7–4.0)
MCH: 28.5 pg (ref 26.0–34.0)
MCHC: 32.7 g/dL (ref 30.0–36.0)
MCV: 87 fL (ref 80.0–100.0)
Monocytes Absolute: 1.6 10*3/uL — ABNORMAL HIGH (ref 0.1–1.0)
Monocytes Relative: 6 %
Neutro Abs: 17.6 10*3/uL — ABNORMAL HIGH (ref 1.7–7.7)
Neutrophils Relative %: 69 %
Platelet Count: 170 10*3/uL (ref 150–400)
RBC: 3.76 MIL/uL — ABNORMAL LOW (ref 4.22–5.81)
RDW: 18.9 % — ABNORMAL HIGH (ref 11.5–15.5)
WBC Count: 25.5 10*3/uL — ABNORMAL HIGH (ref 4.0–10.5)
nRBC: 0.2 % (ref 0.0–0.2)

## 2022-01-31 LAB — CMP (CANCER CENTER ONLY)
ALT: 17 U/L (ref 0–44)
AST: 15 U/L (ref 15–41)
Albumin: 3.4 g/dL — ABNORMAL LOW (ref 3.5–5.0)
Alkaline Phosphatase: 92 U/L (ref 38–126)
Anion gap: 5 (ref 5–15)
BUN: 10 mg/dL (ref 6–20)
CO2: 29 mmol/L (ref 22–32)
Calcium: 8.8 mg/dL — ABNORMAL LOW (ref 8.9–10.3)
Chloride: 104 mmol/L (ref 98–111)
Creatinine: 0.89 mg/dL (ref 0.61–1.24)
GFR, Estimated: >60 mL/min (ref >60)
Glucose, Bld: 131 mg/dL — ABNORMAL HIGH (ref 70–99)
Potassium: 3.7 mmol/L (ref 3.5–5.1)
Sodium: 138 mmol/L (ref 135–145)
Total Bilirubin: 0.3 mg/dL (ref 0.3–1.2)
Total Protein: 5.9 g/dL — ABNORMAL LOW (ref 6.5–8.1)

## 2022-01-31 LAB — CEA (IN HOUSE-CHCC): CEA (CHCC-In House): 43.54 ng/mL — ABNORMAL HIGH (ref 0.00–5.00)

## 2022-01-31 MED ORDER — SODIUM CHLORIDE 0.9% FLUSH
10.0000 mL | Freq: Once | INTRAVENOUS | Status: AC
  Administered 2022-01-31: 10 mL

## 2022-01-31 MED ORDER — HEPARIN SOD (PORK) LOCK FLUSH 100 UNIT/ML IV SOLN: 500.0000 [IU] | Freq: Once | INTRAVENOUS | Status: DC | PRN

## 2022-01-31 MED ORDER — LEUCOVORIN CALCIUM INJECTION 350 MG
200.0000 mg/m2 | Freq: Once | INTRAVENOUS | Status: AC
  Administered 2022-01-31: 390 mg via INTRAVENOUS
  Filled 2022-01-31: qty 19.5

## 2022-01-31 MED ORDER — PALONOSETRON HCL INJECTION 0.25 MG/5ML
0.2500 mg | Freq: Once | INTRAVENOUS | Status: AC
  Administered 2022-01-31: 0.25 mg via INTRAVENOUS
  Filled 2022-01-31: qty 5

## 2022-01-31 MED ORDER — SODIUM CHLORIDE 0.9 % IV SOLN
2575.0000 mg/m2 | INTRAVENOUS | Status: DC
  Administered 2022-01-31: 5000 mg via INTRAVENOUS
  Filled 2022-01-31: qty 100

## 2022-01-31 MED ORDER — DEXTROSE 5 % IV SOLN: Freq: Once | INTRAVENOUS | Status: AC

## 2022-01-31 MED ORDER — SODIUM CHLORIDE 0.9 % IV SOLN
50.0000 mg/m2 | Freq: Once | INTRAVENOUS | Status: AC
  Administered 2022-01-31: 100 mg via INTRAVENOUS
  Filled 2022-01-31: qty 10

## 2022-01-31 MED ORDER — OXALIPLATIN CHEMO INJECTION 100 MG/20ML
85.0000 mg/m2 | Freq: Once | INTRAVENOUS | Status: AC
  Administered 2022-01-31: 165 mg via INTRAVENOUS
  Filled 2022-01-31: qty 33

## 2022-01-31 MED ORDER — SODIUM CHLORIDE 0.9% FLUSH: 10.0000 mL | INTRAVENOUS | Status: DC | PRN

## 2022-01-31 MED ORDER — SODIUM CHLORIDE 0.9 % IV SOLN
10.0000 mg | Freq: Once | INTRAVENOUS | Status: AC
  Administered 2022-01-31: 10 mg via INTRAVENOUS
  Filled 2022-01-31: qty 10

## 2022-01-31 NOTE — Patient Instructions (Addendum)
[facility]  Discharge Instructions: Thank you for choosing [facility] to provide your oncology and hematology care.   If you have a lab appointment with the Cancer Center, please go directly to the Cancer Center and check in at the registration area.   Wear comfortable clothing and clothing appropriate for easy access to any Portacath or PICC line.   We strive to give you quality time with your provider. You may need to reschedule your appointment if you arrive late (15 or more minutes).  Arriving late affects you and other patients whose appointments are after yours.  Also, if you miss three or more appointments without notifying the office, you may be dismissed from the clinic at the provider's discretion.      For prescription refill requests, have your pharmacy contact our office and allow 72 hours for refills to be completed.    Today you received the following chemotherapy and/or immunotherapy agents: Docetaxel, Oxaliplatin, Fluorouracil       To help prevent nausea and vomiting after your treatment, we encourage you to take your nausea medication as directed.  BELOW ARE SYMPTOMS THAT SHOULD BE REPORTED IMMEDIATELY: *FEVER GREATER THAN 100.4 F (38 C) OR HIGHER *CHILLS OR SWEATING *NAUSEA AND VOMITING THAT IS NOT CONTROLLED WITH YOUR NAUSEA MEDICATION *UNUSUAL SHORTNESS OF BREATH *UNUSUAL BRUISING OR BLEEDING *URINARY PROBLEMS (pain or burning when urinating, or frequent urination) *BOWEL PROBLEMS (unusual diarrhea, constipation, pain near the anus) TENDERNESS IN MOUTH AND THROAT WITH OR WITHOUT PRESENCE OF ULCERS (sore throat, sores in mouth, or a toothache) UNUSUAL RASH, SWELLING OR PAIN  UNUSUAL VAGINAL DISCHARGE OR ITCHING   Items with * indicate a potential emergency and should be followed up as soon as possible or go to the Emergency Department if any problems should occur.  Please show the CHEMOTHERAPY ALERT CARD or  IMMUNOTHERAPY ALERT CARD at check-in to the Emergency Department and triage nurse.  Should you have questions after your visit or need to cancel or reschedule your appointment, please contact [facility]  Dept: [phone]  and follow the prompts.  Office hours are 8:00 a.m. to 4:30 p.m. Monday - Friday. Please note that voicemails left after 4:00 p.m. may not be returned until the following business day.  We are closed weekends and major holidays. You have access to a nurse at all times for urgent questions. Please call the main number to the clinic Dept: [phone] and follow the prompts.   For any non-urgent questions, you may also contact your provider using MyChart. We now offer e-Visits for anyone 18 and older to request care online for non-urgent symptoms. For details visit [email or web address].   Also download the MyChart app! Go to the app store, search "MyChart", open the app, select , and log in with your MyChart username and password.  

## 2022-01-31 NOTE — Assessment & Plan Note (Signed)
stage III, TE4H5T9, stage III, MMR normal -diagnosed 10/18/21 by EGD for 6 month f/u of gastric ulcers. Procedure showed mucosal intramucosal adenocarcinoma. MMR normal. -baseline CEA 11/01/21 significantly elevated at 1,989.58. -he met Dr. Kieth Brightly on 11/05/21. -EUS on 11/06/21 by Dr. Paulita Fujita, staged as T3 N3 (at least stage III) -staging CT CAP 11/08/21 showed: stable gastric antrum wall thickening; increased size of upper abdominal lymph nodes; stable left adrenal nodule, 1.9 cm. -he started neoadjuvant FLOT on 11/17.  -PET scan showed FDG avid lymph nodes within the gastrohepatic ligament, portacaval region, and mesentery. Tracer avid left supraclavicular lymph node and indeterminate left adrenal gland nodule with mild FDG uptake. -he underwent [place] node biopsy on 1/17 which was negative for malignant cells, but no lymphoid tissue seen on biopsy  -he is tolerating chemo FLOT well, will continue -will repeat PET scan to evaluate response, and may consider repeat left supraclavicular lymph node biopsy if still positive on PET scan.

## 2022-02-01 ENCOUNTER — Inpatient Hospital Stay: Payer: BC Managed Care – PPO

## 2022-02-01 VITALS — BP 127/81 | HR 85 | Temp 97.9°F | Resp 16

## 2022-02-01 DIAGNOSIS — Z5111 Encounter for antineoplastic chemotherapy: Secondary | ICD-10-CM | POA: Diagnosis not present

## 2022-02-01 DIAGNOSIS — C163 Malignant neoplasm of pyloric antrum: Secondary | ICD-10-CM

## 2022-02-01 MED ORDER — SODIUM CHLORIDE 0.9% FLUSH
10.0000 mL | INTRAVENOUS | Status: DC | PRN
  Administered 2022-02-01: 10 mL

## 2022-02-01 MED ORDER — HEPARIN SOD (PORK) LOCK FLUSH 100 UNIT/ML IV SOLN
500.0000 [IU] | Freq: Once | INTRAVENOUS | Status: AC | PRN
  Administered 2022-02-01: 500 [IU]

## 2022-02-01 MED ORDER — PEGFILGRASTIM-JMDB 6 MG/0.6ML ~~LOC~~ SOSY
6.0000 mg | PREFILLED_SYRINGE | Freq: Once | SUBCUTANEOUS | Status: AC
  Administered 2022-02-01: 6 mg via SUBCUTANEOUS

## 2022-02-03 ENCOUNTER — Telehealth: Payer: Self-pay | Admitting: Hematology

## 2022-02-03 NOTE — Telephone Encounter (Signed)
Spoke with patient spouse confirming upcoming appointments  

## 2022-02-05 ENCOUNTER — Ambulatory Visit (HOSPITAL_COMMUNITY): Payer: BC Managed Care – PPO

## 2022-02-07 ENCOUNTER — Encounter: Payer: Self-pay | Admitting: Physician Assistant

## 2022-02-07 ENCOUNTER — Encounter: Payer: Self-pay | Admitting: Hematology

## 2022-02-07 ENCOUNTER — Encounter (HOSPITAL_COMMUNITY)
Admission: RE | Admit: 2022-02-07 | Discharge: 2022-02-07 | Disposition: A | Discharge status: Home / Self Care | Payer: BC Managed Care – PPO | Source: Ambulatory Visit | Attending: Hematology | Admitting: Hematology

## 2022-02-07 DIAGNOSIS — C163 Malignant neoplasm of pyloric antrum: Secondary | ICD-10-CM | POA: Diagnosis present

## 2022-02-07 LAB — GLUCOSE, CAPILLARY: Glucose-Capillary: 106 mg/dL — ABNORMAL HIGH (ref 70–99)

## 2022-02-07 MED ORDER — FLUDEOXYGLUCOSE F - 18 (FDG) INJECTION
8.5000 | Freq: Once | INTRAVENOUS | Status: AC
  Administered 2022-02-07: 8.5 via INTRAVENOUS

## 2022-02-09 NOTE — Assessment & Plan Note (Addendum)
stage III, cT3N3Mx, with hypermetabolic left supraclavicular node, and indeterminate adrenal nodule, MMR normal -diagnosed 10/18/21 by EGD for 6 month f/u of gastric ulcers. Procedure showed mucosal intramucosal adenocarcinoma. MMR normal. -baseline CEA 11/01/21 significantly elevated at 1,989.58. -he met Dr. Kieth Brightly on 11/05/21. -EUS on 11/06/21 by Dr. Paulita Fujita, staged as T3 N3 (at least stage III) -staging CT CAP 11/08/21 showed: stable gastric antrum wall thickening; increased size of upper abdominal lymph nodes; stable left adrenal nodule, 1.9 cm. -he started neoadjuvant FLOT on 11/17.  -PET scan showed FDG avid lymph nodes within the gastrohepatic ligament, portacaval region, and mesentery. Tracer avid left supraclavicular lymph node and indeterminate left adrenal gland nodule with mild FDG uptake. -he underwent [place] node biopsy on 1/17 which was negative for malignant cells, but no lymphoid tissue seen on biopsy  -he is tolerating chemo FLOT well, will continue -repeated PET on 02/07/22 showed resolved left supraclavicular lymph node, and significant improvement in regional lymph nodes and the primary tumor, stable and indeterminate left adrenal nodule.  I presented his case in GI conference, to see if he is a candidate for surgery, and if we need to further imaging to evaluate his left adrenal lesion.

## 2022-02-10 ENCOUNTER — Inpatient Hospital Stay: Payer: BC Managed Care – PPO | Attending: Physician Assistant | Admitting: Hematology

## 2022-02-10 ENCOUNTER — Encounter: Payer: Self-pay | Admitting: Hematology

## 2022-02-10 DIAGNOSIS — E278 Other specified disorders of adrenal gland: Secondary | ICD-10-CM | POA: Insufficient documentation

## 2022-02-10 DIAGNOSIS — F17210 Nicotine dependence, cigarettes, uncomplicated: Secondary | ICD-10-CM | POA: Insufficient documentation

## 2022-02-10 DIAGNOSIS — R970 Elevated carcinoembryonic antigen [CEA]: Secondary | ICD-10-CM | POA: Insufficient documentation

## 2022-02-10 DIAGNOSIS — Z8711 Personal history of peptic ulcer disease: Secondary | ICD-10-CM | POA: Insufficient documentation

## 2022-02-10 DIAGNOSIS — Z8619 Personal history of other infectious and parasitic diseases: Secondary | ICD-10-CM | POA: Insufficient documentation

## 2022-02-10 DIAGNOSIS — Z5189 Encounter for other specified aftercare: Secondary | ICD-10-CM | POA: Insufficient documentation

## 2022-02-10 DIAGNOSIS — Z5111 Encounter for antineoplastic chemotherapy: Secondary | ICD-10-CM | POA: Insufficient documentation

## 2022-02-10 DIAGNOSIS — C163 Malignant neoplasm of pyloric antrum: Secondary | ICD-10-CM | POA: Insufficient documentation

## 2022-02-10 NOTE — Progress Notes (Signed)
[facility]   Telephone:([phone] [phone]   Clinic Follow up Note   Patient Care Team: Pcp, No as PCP - General Truitt Merle, MD as Consulting Physician (Hematology and Oncology)  Date of Service:  02/10/2022  I connected with Jonathan Maynard on 02/10/2022 at 11:00 AM EST by telephone visit and verified that I am speaking with the correct person using two identifiers.  I discussed the limitations, risks, security and privacy concerns of performing Jonathan evaluation and management service by telephone and the availability of in person appointments. I also discussed with the patient that there may be a patient responsible charge related to this service. The patient expressed understanding and agreed to proceed.   Other persons participating in the visit and their role in the encounter:  Wife  Patient's location:  work Provider's location:  office  CHIEF COMPLAINT: f/u of gastric cancer   CURRENT THERAPY:  Neoadjuvant FLOT, q14d, starting 11/22/21       ASSESSMENT & PLAN:  Jonathan Maynard is a 52 y.o. 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 Procedure showed mucosal intramucosal adenocarcinoma. MMR normal. -baseline CEA 11/01/21 significantly elevated at 1,989.58. -he met Dr. Kieth Brightly on 11/05/21. -EUS on 11/06/21 by Dr. Paulita Fujita, staged as T3 N3 (at least stage III) -staging CT CAP 11/08/21 showed: stable gastric antrum wall thickening; increased size of upper abdominal lymph nodes; stable left adrenal nodule, 1.9 cm. -he started neoadjuvant FLOT on 11/17.  -PET scan showed FDG avid lymph nodes within the gastrohepatic ligament, portacaval region, and mesentery. Tracer avid left supraclavicular lymph node and indeterminate left adrenal gland nodule with mild FDG uptake. -he  underwent [place] node biopsy on 1/17 which was negative for malignant cells, but no lymphoid tissue seen on biopsy  -he is tolerating chemo FLOT well, will continue -repeated PET on 02/07/22 showed resolved left supraclavicular lymph node, and significant improvement in regional lymph nodes and the primary tumor, stable and indeterminate left adrenal nodule.  I presented his case in GI conference, to see if he is a candidate for surgery, and if we need to further imaging to evaluate his left adrenal lesion.  PLAN: -Discuss PET Scan findings, he has had good PR -Plan discuss a Case in GI conference Recommend continue Chemo -lab,flush,and FLOT 02/14/2022, will cancel his OV on 2/9   SUMMARY OF ONCOLOGIC HISTORY: Oncology History Overview Note   Cancer Staging  Gastric cancer [facility]) Staging form: Stomach, AJCC 8th Edition - Clinical stage from 10/18/2021: Stage III (cT3, cN3a, cM0) - Signed by Truitt Merle, MD on 11/10/2021     Gastric cancer ([place])  10/18/2021 Procedure   EGD performed under the care of Dr. Michail Sermon  Findings:  -A large, ulcerated, partially circumferential (involving two thirds of the lumen circumference) mass with no bleeding and stigmata of recent bleeding was found in the gastric antrum, at the pylorus and in the prepyloric region of the stomach. -Segmental severe inflammation characterized by congestion (edema) and erythema was found in the gastric antrum.   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 Negative for dysplasia.  Negative for helicobacter  pylori  Mismatch Repair (MMR) Protein Imunohistochemistry (IHC): IHC Expression Result: MLH1: Preserved nuclear expression. MSH2: Preserved nuclear expression. MSH6: Preserved nuclear expression. PMS2: Preserved nuclear expression. Interpretation: NORMAL   10/31/2021 Initial Diagnosis   Gastric cancer ([place])   11/01/2021 Tumor Marker   CEA = 7,680.88 (^)   11/06/2021 Procedure   Upper EUS, Dr. Paulita Fujita  Impression: - Normal esophagus. - A small amount of food (residue) in the stomach. - Congested, friable (with contact bleeding) and ulcerated mucosa in the prepyloric region of the stomach. - Normal examined duodenum. - There was no evidence of significant pathology in the left lobe of the liver. - Many abnormal lymph nodes (over 10) were visualized in the gastrohepatic ligament (level 18), celiac region (level 20), perigastric region, peripancreatic region and aortocaval region. - Wall thickening was seen in the prepyloric region of the stomach. The thickening appeared to be primarily within the deep mucosa (Layer 2) but extended through the muscularis propria. - Overall constellation of findings consistent with at least T3 N3 Mx (at least stage III) gastric adenocarcinoma.   11/08/2021 Imaging   EXAM: CT CHEST, ABDOMEN, AND PELVIS WITH CONTRAST  IMPRESSION: 1. Similar irregular wall thickening of the gastric antrum, consistent with patient's known primary gastric neoplasm. 2. Increased size of the upper abdominal lymph nodes, concerning for worsening nodal disease involvement. 3. Stable left adrenal nodule common nonspecific possibly a metastatic lesion or adenoma. 4. No evidence of metastatic disease in the chest. 5. Left-sided colonic diverticulosis without findings of acute diverticulitis. 6. Enlarged prostate gland. 7.  Aortic Atherosclerosis (ICD10-I70.0).   11/22/2021 -  Chemotherapy   Patient is on Treatment Plan : GASTROESOPHAGEAL FLOT q14d X 4 cycles      11/27/2021 Imaging    IMPRESSION: 1. The mass within the gastric antrum is mildly decreased in size when compared with 11/08/2021. There is persistent FDG uptake associated with this mass compatible with residual tumor. 2. Persistent FDG avid lymph nodes within the gastrohepatic ligament, portacaval region, and mesentery. These are mildly decreased in size when compared with 11/08/2021. 3. Tracer avid left supraclavicular lymph node is also mildly decreased in size when compared with 11/08/2021. 4. Indeterminate left adrenal gland nodule with mild FDG uptake. This may represent a lipid poor adenoma. Metastatic disease not exclude. 5. Diffuse increased uptake throughout the bone marrow is favored to represent treatment related change. 6.  Aortic Atherosclerosis (ICD10-I70.0).     02/07/2022 Imaging    IMPRESSION: 1. No substantial change in hypermetabolism associated with the distal gastric lesion. Although difficult to discern on noncontrast CT imaging, the soft tissue component appears to be decreased since the prior PET-CT. 2. Interval resolution of the hypermetabolic left supraclavicular lymph node. 3. Interval marked decrease of the hypermetabolic gastrohepatic ligament lymphadenopathy. Similar decrease in size and hypermetabolism associated with index nodes identified previously in the 4. porta hepatis and hypogastric region. 5. Stable left adrenal nodule with slight hypermetabolism. Nonspecific finding could represent lipid poor adenoma or metastatic deposit. 6. No new suspicious hypermetabolic disease on today's study. 7.  Aortic Atherosclerosis (ICD10-I70.0).      INTERVAL HISTORY:  Jonathan Maynard was contacted for a follow up of gastric cancer . He was last seen by me on 01/31/2022.   Pt had throat swelling from last treatment. He states that he has his diarrhea under control. He denies  having numbness and tingling.      All other systems were  reviewed with the patient and are negative.  MEDICAL HISTORY:  Past Medical History:  Diagnosis Date   Acute upper GI bleed 04/12/2021   Class 1 obesity 04/12/2021   Diverticulosis 04/12/2021   Hepatic steatosis 04/12/2021    SURGICAL HISTORY: Past Surgical History:  Procedure Laterality Date   BIOPSY  04/12/2021   Procedure: BIOPSY;  Surgeon: Wilford Corner, MD;  Location: [facility];  Service: Gastroenterology;;   ESOPHAGOGASTRODUODENOSCOPY N/A 04/12/2021   Procedure: ESOPHAGOGASTRODUODENOSCOPY (EGD);  Surgeon: Wilford Corner, MD;  Location: [facility];  Service: Gastroenterology;  Laterality: N/A;   ESOPHAGOGASTRODUODENOSCOPY N/A 11/06/2021   Procedure: ESOPHAGOGASTRODUODENOSCOPY (EGD);  Surgeon: Arta Silence, MD;  Location: [facility];  Service: Gastroenterology;  Laterality: N/A;   HERNIA REPAIR     IR IMAGING GUIDED PORT INSERTION  11/15/2021   UPPER ESOPHAGEAL ENDOSCOPIC ULTRASOUND (EUS) Bilateral 11/06/2021   Procedure: UPPER ESOPHAGEAL ENDOSCOPIC ULTRASOUND (EUS);  Surgeon: Arta Silence, MD;  Location: [facility];  Service: Gastroenterology;  Laterality: Bilateral;    I have reviewed the social history and family history with the patient and they are unchanged from previous note.  ALLERGIES:  has No Known Allergies.  MEDICATIONS:  Current Outpatient Medications  Medication Sig Dispense Refill   dexamethasone (DECADRON) 4 MG tablet Take 1 tablet by mouth twice a day the day before chemo.  Then take 1 tablet by mouth once daily for 2 days after chemo. 30 tablet 1   ferrous sulfate 325 (65 FE) MG tablet Take 1 tablet (325 mg total) by mouth daily. 30 tablet 3   lidocaine-prilocaine (EMLA) cream Apply to affected area as directed once (Patient not taking: Reported on 01/03/2022) 30 g 3   ondansetron (ZOFRAN) 8 MG tablet Take 1 tablet (8 mg total) by mouth every 8 (eight) hours as needed for nausea or vomiting. Start on the third day after chemotherapy. 30 tablet  1   pantoprazole (PROTONIX) 40 MG tablet Take 1 tablet (40 mg total) by mouth 2 (two) times daily. 60 tablet 3   prochlorperazine (COMPAZINE) 10 MG tablet Take 1 tablet (10 mg total) by mouth every 6 (six) hours as needed for nausea or vomiting. 30 tablet 1   No current facility-administered medications for this visit.    PHYSICAL EXAMINATION: ECOG PERFORMANCE STATUS: 1 - Symptomatic but completely ambulatory  There were no vitals filed for this visit. Wt Readings from Last 3 Encounters:  01/31/22 177 lb 4 oz (80.4 kg)  01/17/22 182 lb 11.2 oz (82.9 kg)  01/03/22 182 lb 8 oz (82.8 kg)     No vitals taken today, Exam not performed today  LABORATORY DATA:  I have reviewed the data as listed    Latest Ref Rng & Units 01/31/2022    7:51 AM 01/17/2022    8:17 AM 01/03/2022    8:32 AM  CBC  WBC 4.0 - 10.5 K/uL 25.5  22.1  18.9   Hemoglobin 13.0 - 17.0 g/dL 10.7  10.8  9.8   Hematocrit 39.0 - 52.0 % 32.7  32.4  29.8   Platelets 150 - 400 K/uL 170  202  199         Latest Ref Rng & Units 01/31/2022    7:51 AM 01/17/2022    8:17 AM 01/03/2022    8:32 AM  CMP  Glucose 70 - 99 mg/dL 131  109  131   BUN 6 - 20 mg/dL 10  12  13   Creatinine 0.61 - 1.24 mg/dL 0.89  0.75  0.76   Sodium 135 - 145 mmol/L 138  138  139   Potassium 3.5 - 5.1 mmol/L 3.7  3.8  3.6   Chloride 98 - 111 mmol/L 104  103  105   CO2 22 - 32 mmol/L '29  29  29   '$ Calcium 8.9 - 10.3 mg/dL 8.8  9.1  8.9   Total Protein 6.5 - 8.1 g/dL 5.9  6.0  6.0   Total Bilirubin 0.3 - 1.2 mg/dL 0.3  0.2  0.2   Alkaline Phos 38 - 126 U/L 92  90  78   AST 15 - 41 U/L '15  15  11   '$ ALT 0 - 44 U/L '17  18  12       '$ RADIOGRAPHIC STUDIES: I have personally reviewed the radiological images as listed and agreed with the findings in the report. No results found.    No orders of the defined types were placed in this encounter.  All questions were answered. The patient knows to call the clinic with any problems, questions or  concerns. No barriers to learning was detected. The total time spent in the appointment was 22 minutes.     Truitt Merle, MD 02/10/2022   Felicity Coyer am acting as scribe for Truitt Merle, MD.   I have reviewed the above documentation for accuracy and completeness, and I agree with the above.

## 2022-02-12 ENCOUNTER — Other Ambulatory Visit: Payer: Self-pay

## 2022-02-12 ENCOUNTER — Telehealth: Payer: Self-pay

## 2022-02-12 ENCOUNTER — Other Ambulatory Visit: Payer: Self-pay | Admitting: Hematology

## 2022-02-12 DIAGNOSIS — C163 Malignant neoplasm of pyloric antrum: Secondary | ICD-10-CM

## 2022-02-12 NOTE — Telephone Encounter (Addendum)
Called patients wife and made her aware to call Centralized Scheduling to set up time for MRI. I tried to call patient and he didn't answer. Told his wife to do it as soon as possible.     ----- Message from Truitt Merle, MD sent at 02/12/2022  9:00 AM EST ----- Sorry, pt is attached now   ----- Message ----- From: Evalee Jefferson, RN Sent: 02/12/2022   8:51 AM EST To: Elayne Guerin, CMA; Gardiner Coins; Truitt Merle, MD  Dr. Burr Medico, Who are you talking about?  ----- Message ----- From: Truitt Merle, MD Sent: 02/12/2022   8:13 AM EST To: Elayne Guerin, CMA; Gardiner Coins; #  Please get his abdominal MRI scheduled ASAP, please let pt know also (we discussed in GI tumor board this morning and recommend MRI to evaluate his left adrenal lesion).   Thanks   Krista Blue

## 2022-02-12 NOTE — Progress Notes (Signed)
The proposed treatment discussed in conference is for discussion purpose only and is not a binding recommendation.  The patients have not been physically examined, or presented with their treatment options.  Therefore, final treatment plans cannot be decided.  

## 2022-02-13 ENCOUNTER — Telehealth: Payer: Self-pay

## 2022-02-13 ENCOUNTER — Other Ambulatory Visit: Payer: Self-pay

## 2022-02-13 MED FILL — Dexamethasone Sodium Phosphate Inj 100 MG/10ML: INTRAMUSCULAR | Qty: 1 | Status: AC

## 2022-02-13 NOTE — Progress Notes (Signed)
I spoke with Mrs Friday relayed the recommendation of genetic testing.  I explained that if this gastric cancer is from DNA flaw his siblings and children would be at higher risk for gastric cancer.  I provided my directed number.  They will et me know if they have any further questions and his decision on testing.

## 2022-02-13 NOTE — Telephone Encounter (Addendum)
Called patients wife and made her aware of telephone appointment for 2-16 at 320.     ----- Message from Evalee Jefferson, RN sent at 02/12/2022  1:13 PM EST ----- Pt scheduled for 02/19/2022.  Scheduling message sent to [facility] to schedule a telephone visit on 02/21/2022 to go over the results. ----- Message ----- From: Truitt Merle, MD Sent: 02/12/2022   9:00 AM EST To: Elayne Guerin, CMA; Gardiner Coins; #  Sorry, pt is attached now   ----- Message ----- From: Evalee Jefferson, RN Sent: 02/12/2022   8:51 AM EST To: Elayne Guerin, CMA; Gardiner Coins; Truitt Merle, MD  Dr. Burr Medico, Who are you talking about?  ----- Message ----- From: Truitt Merle, MD Sent: 02/12/2022   8:13 AM EST To: Elayne Guerin, CMA; Gardiner Coins; #  Please get his abdominal MRI scheduled ASAP, please let pt know also (we discussed in GI tumor board this morning and recommend MRI to evaluate his left adrenal lesion).   Thanks   Krista Blue

## 2022-02-14 ENCOUNTER — Inpatient Hospital Stay: Payer: BC Managed Care – PPO | Admitting: Hematology

## 2022-02-14 ENCOUNTER — Ambulatory Visit: Payer: BC Managed Care – PPO | Admitting: Hematology

## 2022-02-14 ENCOUNTER — Other Ambulatory Visit (HOSPITAL_COMMUNITY): Payer: Self-pay

## 2022-02-14 ENCOUNTER — Encounter: Payer: Self-pay | Admitting: Hematology

## 2022-02-14 ENCOUNTER — Inpatient Hospital Stay: Payer: BC Managed Care – PPO

## 2022-02-14 ENCOUNTER — Encounter: Payer: Self-pay | Admitting: Physician Assistant

## 2022-02-14 ENCOUNTER — Other Ambulatory Visit: Payer: Self-pay

## 2022-02-14 ENCOUNTER — Other Ambulatory Visit: Payer: BC Managed Care – PPO

## 2022-02-14 VITALS — BP 116/83 | HR 95 | Temp 98.2°F | Resp 18 | Wt 182.5 lb

## 2022-02-14 DIAGNOSIS — E278 Other specified disorders of adrenal gland: Secondary | ICD-10-CM | POA: Diagnosis not present

## 2022-02-14 DIAGNOSIS — R970 Elevated carcinoembryonic antigen [CEA]: Secondary | ICD-10-CM | POA: Diagnosis not present

## 2022-02-14 DIAGNOSIS — Z5111 Encounter for antineoplastic chemotherapy: Secondary | ICD-10-CM | POA: Diagnosis present

## 2022-02-14 DIAGNOSIS — Z8711 Personal history of peptic ulcer disease: Secondary | ICD-10-CM | POA: Diagnosis not present

## 2022-02-14 DIAGNOSIS — C163 Malignant neoplasm of pyloric antrum: Secondary | ICD-10-CM | POA: Diagnosis present

## 2022-02-14 DIAGNOSIS — Z5189 Encounter for other specified aftercare: Secondary | ICD-10-CM | POA: Diagnosis not present

## 2022-02-14 DIAGNOSIS — F17210 Nicotine dependence, cigarettes, uncomplicated: Secondary | ICD-10-CM | POA: Diagnosis not present

## 2022-02-14 DIAGNOSIS — Z95828 Presence of other vascular implants and grafts: Secondary | ICD-10-CM

## 2022-02-14 DIAGNOSIS — Z8619 Personal history of other infectious and parasitic diseases: Secondary | ICD-10-CM | POA: Diagnosis not present

## 2022-02-14 LAB — CBC WITH DIFFERENTIAL (CANCER CENTER ONLY)
Abs Immature Granulocytes: 4.05 10*3/uL — ABNORMAL HIGH (ref 0.00–0.07)
Basophils Absolute: 0 10*3/uL (ref 0.0–0.1)
Basophils Relative: 0 %
Eosinophils Absolute: 0 10*3/uL (ref 0.0–0.5)
Eosinophils Relative: 0 %
HCT: 33.6 % — ABNORMAL LOW (ref 39.0–52.0)
Hemoglobin: 10.8 g/dL — ABNORMAL LOW (ref 13.0–17.0)
Immature Granulocytes: 15 %
Lymphocytes Relative: 23 %
Lymphs Abs: 6 10*3/uL — ABNORMAL HIGH (ref 0.7–4.0)
MCH: 28.3 pg (ref 26.0–34.0)
MCHC: 32.1 g/dL (ref 30.0–36.0)
MCV: 88 fL (ref 80.0–100.0)
Monocytes Absolute: 1.9 10*3/uL — ABNORMAL HIGH (ref 0.1–1.0)
Monocytes Relative: 7 %
Neutro Abs: 14.7 10*3/uL — ABNORMAL HIGH (ref 1.7–7.7)
Neutrophils Relative %: 55 %
Platelet Count: 181 10*3/uL (ref 150–400)
RBC: 3.82 MIL/uL — ABNORMAL LOW (ref 4.22–5.81)
RDW: 19.1 % — ABNORMAL HIGH (ref 11.5–15.5)
WBC Count: 26.7 10*3/uL — ABNORMAL HIGH (ref 4.0–10.5)
nRBC: 1.4 % — ABNORMAL HIGH (ref 0.0–0.2)

## 2022-02-14 LAB — CMP (CANCER CENTER ONLY)
ALT: 17 U/L (ref 0–44)
AST: 16 U/L (ref 15–41)
Albumin: 3.3 g/dL — ABNORMAL LOW (ref 3.5–5.0)
Alkaline Phosphatase: 102 U/L (ref 38–126)
Anion gap: 6 (ref 5–15)
BUN: 8 mg/dL (ref 6–20)
CO2: 29 mmol/L (ref 22–32)
Calcium: 9 mg/dL (ref 8.9–10.3)
Chloride: 104 mmol/L (ref 98–111)
Creatinine: 0.79 mg/dL (ref 0.61–1.24)
GFR, Estimated: >60 mL/min (ref >60)
Glucose, Bld: 106 mg/dL — ABNORMAL HIGH (ref 70–99)
Potassium: 3.9 mmol/L (ref 3.5–5.1)
Sodium: 139 mmol/L (ref 135–145)
Total Bilirubin: 0.2 mg/dL — ABNORMAL LOW (ref 0.3–1.2)
Total Protein: 5.9 g/dL — ABNORMAL LOW (ref 6.5–8.1)

## 2022-02-14 MED ORDER — LEUCOVORIN CALCIUM INJECTION 350 MG
200.0000 mg/m2 | Freq: Once | INTRAVENOUS | Status: AC
  Administered 2022-02-14: 390 mg via INTRAVENOUS
  Filled 2022-02-14: qty 19.5

## 2022-02-14 MED ORDER — MAGIC MOUTHWASH: 5.0000 mL | Freq: Four times a day (QID) | ORAL | 0 refills | Status: DC | PRN

## 2022-02-14 MED ORDER — SODIUM CHLORIDE 0.9% FLUSH
10.0000 mL | Freq: Once | INTRAVENOUS | Status: AC
  Administered 2022-02-14: 10 mL

## 2022-02-14 MED ORDER — DEXTROSE 5 % IV SOLN: Freq: Once | INTRAVENOUS | Status: AC

## 2022-02-14 MED ORDER — STERILE WATER FOR INJECTION IJ SOLN
OROMUCOSAL | 0 refills | Status: DC
  Filled 2022-02-14: qty 480, 12d supply, fill #0

## 2022-02-14 MED ORDER — SODIUM CHLORIDE 0.9 % IV SOLN
50.0000 mg/m2 | Freq: Once | INTRAVENOUS | Status: AC
  Administered 2022-02-14: 98 mg via INTRAVENOUS
  Filled 2022-02-14: qty 9.8

## 2022-02-14 MED ORDER — PALONOSETRON HCL INJECTION 0.25 MG/5ML
0.2500 mg | Freq: Once | INTRAVENOUS | Status: AC
  Administered 2022-02-14: 0.25 mg via INTRAVENOUS
  Filled 2022-02-14: qty 5

## 2022-02-14 MED ORDER — SODIUM CHLORIDE 0.9 % IV SOLN
2600.0000 mg/m2 | INTRAVENOUS | Status: DC
  Administered 2022-02-14: 5000 mg via INTRAVENOUS
  Filled 2022-02-14: qty 100

## 2022-02-14 MED ORDER — SODIUM CHLORIDE 0.9 % IV SOLN
10.0000 mg | Freq: Once | INTRAVENOUS | Status: AC
  Administered 2022-02-14: 10 mg via INTRAVENOUS
  Filled 2022-02-14: qty 10

## 2022-02-14 MED ORDER — OXALIPLATIN CHEMO INJECTION 100 MG/20ML
85.0000 mg/m2 | Freq: Once | INTRAVENOUS | Status: AC
  Administered 2022-02-14: 165 mg via INTRAVENOUS
  Filled 2022-02-14: qty 33

## 2022-02-14 NOTE — Progress Notes (Signed)
Ok to run 5FU pump at 10.5 ml an hour per Dr. Burr Medico so patinet can have pump removed on Saturday in time.

## 2022-02-15 ENCOUNTER — Inpatient Hospital Stay: Payer: BC Managed Care – PPO

## 2022-02-15 VITALS — BP 129/71 | HR 91 | Temp 96.8°F | Resp 17

## 2022-02-15 DIAGNOSIS — Z5111 Encounter for antineoplastic chemotherapy: Secondary | ICD-10-CM | POA: Diagnosis not present

## 2022-02-15 DIAGNOSIS — C163 Malignant neoplasm of pyloric antrum: Secondary | ICD-10-CM

## 2022-02-15 MED ORDER — SODIUM CHLORIDE 0.9% FLUSH
10.0000 mL | INTRAVENOUS | Status: DC | PRN
  Administered 2022-02-15: 10 mL

## 2022-02-15 MED ORDER — PEGFILGRASTIM-JMDB 6 MG/0.6ML ~~LOC~~ SOSY
6.0000 mg | PREFILLED_SYRINGE | Freq: Once | SUBCUTANEOUS | Status: AC
  Administered 2022-02-15: 6 mg via SUBCUTANEOUS

## 2022-02-15 MED ORDER — HEPARIN SOD (PORK) LOCK FLUSH 100 UNIT/ML IV SOLN
500.0000 [IU] | Freq: Once | INTRAVENOUS | Status: AC | PRN
  Administered 2022-02-15: 500 [IU]

## 2022-02-19 ENCOUNTER — Ambulatory Visit (HOSPITAL_COMMUNITY)
Admission: RE | Admit: 2022-02-19 | Discharge: 2022-02-19 | Disposition: A | Discharge status: Home / Self Care | Payer: BC Managed Care – PPO | Source: Ambulatory Visit | Attending: Hematology | Admitting: Hematology

## 2022-02-19 DIAGNOSIS — C163 Malignant neoplasm of pyloric antrum: Secondary | ICD-10-CM | POA: Diagnosis present

## 2022-02-20 NOTE — Progress Notes (Signed)
[place]   Telephone:([phone]) [phone] [phone]   Clinic Follow up Note   Patient Care Team: Pcp, No as PCP - General Truitt Merle, MD as Consulting Physician (Hematology and Oncology)  Date of Service:  02/21/2022  I connected with Jonathan Maynard on 02/21/2022 at  3:20 PM EST by telephone visit and verified that I am speaking with the correct person using two identifiers.  I discussed the limitations, risks, security and privacy concerns of performing an evaluation and management service by telephone and the availability of in person appointments. I also discussed with the patient that there may be a patient responsible charge related to this service. The patient expressed understanding and agreed to proceed.   Other persons participating in the visit and their role in the encounter:  Wife  Patient's location:  work Provider's location:  Office  CHIEF COMPLAINT: f/u of  gastric cancer    CURRENT THERAPY:  Neoadjuvant FLOT, q14d, starting 11/22/21        ASSESSMENT & PLAN:  Jonathan Maynard is a 52 y.o. 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 Procedure showed mucosal intramucosal adenocarcinoma. MMR normal. -baseline CEA 11/01/21 significantly elevated at 1,989.58. -he met Dr. Kieth Brightly on 11/05/21. -EUS on 11/06/21 by Dr. Paulita Fujita, staged as T3 N3 (at least stage III) -staging CT CAP 11/08/21 showed: stable gastric antrum wall thickening; increased size of upper abdominal lymph nodes; stable left adrenal nodule, 1.9 cm. -he started neoadjuvant FLOT on 11/17.  -PET scan showed FDG avid lymph nodes within the gastrohepatic ligament, portacaval region, and mesentery. Tracer avid left supraclavicular lymph node and indeterminate left adrenal gland nodule with mild FDG uptake. -he  underwent [place] node biopsy on 1/17 which was negative for malignant cells, but no lymphoid tissue seen on biopsy  -he is tolerating chemo FLOT well, will continue -repeated PET on 02/07/22 showed resolved left supraclavicular lymph node, and significant improvement in regional lymph nodes and the primary tumor, stable and indeterminate left adrenal nodule.  -abdominal MRI on 02/19/22 showed Left adrenal nodule measuring 16 mm does not demonstrate loss of signal on out of phase imaging differential considerations include metastasis or lipid poor adenoma.  Given the concern of metastasis, I recommend IR biopsy. He agrees   PLAN: -Discuss MRI scan findings -Left Adrenal nodule measuring 16 mm, indeterminate -Referral to IR Biopsy -next chemo in 1 weeek    SUMMARY OF ONCOLOGIC HISTORY: Oncology History Overview Note   Cancer Staging  Gastric cancer [facility]) Staging form: Stomach, AJCC 8th Edition - Clinical stage from 10/18/2021: Stage III (cT3, cN3a, cM0) - Signed by Truitt Merle, MD on 11/10/2021     Gastric cancer ([place])  10/18/2021 Procedure   EGD performed under the care of Dr. Michail Sermon  Findings:  -A large, ulcerated, partially circumferential (involving two thirds of the lumen circumference) mass with no bleeding and stigmata of recent bleeding was found in the gastric antrum, at the pylorus and in the prepyloric region of the stomach. -Segmental severe inflammation characterized by congestion (edema) and erythema was found in the gastric antrum.   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 Negative for dysplasia.  Negative for helicobacter pylori  Mismatch Repair (MMR) Protein Imunohistochemistry (IHC): IHC Expression Result: MLH1: Preserved nuclear expression. MSH2: Preserved nuclear expression. MSH6: Preserved nuclear expression. PMS2: Preserved nuclear expression. Interpretation: NORMAL   10/31/2021 Initial Diagnosis   Gastric cancer ([place])   11/01/2021 Tumor Marker   CEA = [id number] (^)   11/06/2021 Procedure   Upper EUS, Dr. Paulita Fujita  Impression: - Normal esophagus. - A small amount of food (residue) in the stomach. - Congested, friable (with contact bleeding) and ulcerated mucosa in the prepyloric region of the stomach. - Normal examined duodenum. - There was no evidence of significant pathology in the left lobe of the liver. - Many abnormal lymph nodes (over 10) were visualized in the gastrohepatic ligament (level 18), celiac region (level 20), perigastric region, peripancreatic region and aortocaval region. - Wall thickening was seen in the prepyloric region of the stomach. The thickening appeared to be primarily within the deep mucosa (Layer 2) but extended through the muscularis propria. - Overall constellation of findings consistent with at least T3 N3 Mx (at least stage III) gastric adenocarcinoma.   11/08/2021 Imaging   EXAM: CT CHEST, ABDOMEN, AND PELVIS WITH CONTRAST  IMPRESSION: 1. Similar irregular wall thickening of the gastric antrum, consistent with patient's known primary gastric neoplasm. 2. Increased size of the upper abdominal lymph nodes, concerning for worsening nodal disease involvement. 3. Stable left adrenal nodule common nonspecific possibly a metastatic lesion or adenoma. 4. No evidence of metastatic disease in the chest. 5. Left-sided colonic diverticulosis without findings of acute diverticulitis. 6. Enlarged prostate gland. 7.  Aortic Atherosclerosis (ICD10-I70.0).   11/22/2021 -  Chemotherapy   Patient is on  Treatment Plan : GASTROESOPHAGEAL FLOT q14d X 4 cycles     11/27/2021 Imaging    IMPRESSION: 1. The mass within the gastric antrum is mildly decreased in size when compared with 11/08/2021. There is persistent FDG uptake associated with this mass compatible with residual tumor. 2. Persistent FDG avid lymph nodes within the gastrohepatic ligament, portacaval region, and mesentery. These are mildly decreased in size when compared with 11/08/2021. 3. Tracer avid left supraclavicular lymph node is also mildly decreased in size when compared with 11/08/2021. 4. Indeterminate left adrenal gland nodule with mild FDG uptake. This may represent a lipid poor adenoma. Metastatic disease not exclude. 5. Diffuse increased uptake throughout the bone marrow is favored to represent treatment related change. 6.  Aortic Atherosclerosis (ICD10-I70.0).     02/07/2022 Imaging    IMPRESSION: 1. No substantial change in hypermetabolism associated with the distal gastric lesion. Although difficult to discern on noncontrast CT imaging, the soft tissue component appears to be decreased since the prior PET-CT. 2. Interval resolution of the hypermetabolic left supraclavicular lymph node. 3. Interval marked decrease of the hypermetabolic gastrohepatic ligament lymphadenopathy. Similar decrease in size and hypermetabolism associated with index nodes identified previously in the 4. porta hepatis and hypogastric region. 5. Stable left adrenal nodule with slight hypermetabolism. Nonspecific finding could represent lipid poor adenoma or metastatic deposit. 6. No new suspicious hypermetabolic disease on today's study. 7.  Aortic Atherosclerosis (ICD10-I70.0).      INTERVAL HISTORY:  Morey Frerichs was contacted for a follow up of  gastric cancer  . He was last seen by me on 02/10/2022.  Pt reports that his hands had turned red. Pt  states he's able to do thing and the skin is peeling and itching.    All other systems were reviewed with the patient and are negative.  MEDICAL HISTORY:  Past Medical History:  Diagnosis Date   Acute upper GI bleed 04/12/2021   Class 1 obesity 04/12/2021   Diverticulosis 04/12/2021   Hepatic steatosis 04/12/2021    SURGICAL HISTORY: Past Surgical History:  Procedure Laterality Date   BIOPSY  04/12/2021   Procedure: BIOPSY;  Surgeon: Wilford Corner, MD;  Location: [facility];  Service: Gastroenterology;;   ESOPHAGOGASTRODUODENOSCOPY N/A 04/12/2021   Procedure: ESOPHAGOGASTRODUODENOSCOPY (EGD);  Surgeon: Wilford Corner, MD;  Location: [facility];  Service: Gastroenterology;  Laterality: N/A;   ESOPHAGOGASTRODUODENOSCOPY N/A 11/06/2021   Procedure: ESOPHAGOGASTRODUODENOSCOPY (EGD);  Surgeon: Arta Silence, MD;  Location: [facility];  Service: Gastroenterology;  Laterality: N/A;   HERNIA REPAIR     IR IMAGING GUIDED PORT INSERTION  11/15/2021   UPPER ESOPHAGEAL ENDOSCOPIC ULTRASOUND (EUS) Bilateral 11/06/2021   Procedure: UPPER ESOPHAGEAL ENDOSCOPIC ULTRASOUND (EUS);  Surgeon: Arta Silence, MD;  Location: [facility];  Service: Gastroenterology;  Laterality: Bilateral;    I have reviewed the social history and family history with the patient and they are unchanged from previous note.  ALLERGIES:  has No Known Allergies.  MEDICATIONS:  Current Outpatient Medications  Medication Sig Dispense Refill   dexamethasone (DECADRON) 4 MG tablet Take 1 tablet by mouth twice a day the day before chemo.  Then take 1 tablet by mouth once daily for 2 days after chemo. 30 tablet 1   ferrous sulfate 325 (65 FE) MG tablet Take 1 tablet (325 mg total) by mouth daily. 30 tablet 3   lidocaine-prilocaine (EMLA) cream Apply to affected area as directed once (Patient not taking: Reported on 01/03/2022) 30 g 3   magic mouthwash (lidocaine, diphenhydrAMINE, alum & mag hydroxide) suspension Swish and spit 15 mLs 3 (three) times daily. 100 mL 0   magic mouthwash  (multi-ingredient) oral suspension Swish, gargle and spit 5-10 mls by mouth every 6 hours as needed 480 mL 0   magic mouthwash SOLN Take 5 mLs by mouth 4 (four) times daily as needed for mouth pain. Swish, gargle, and spit one to two teaspoonfuls for 1 minute. Repeat every 6 hours as needed. 480 mL 0   ondansetron (ZOFRAN) 8 MG tablet Take 1 tablet (8 mg total) by mouth every 8 (eight) hours as needed for nausea or vomiting. Start on the third day after chemotherapy. 30 tablet 1   pantoprazole (PROTONIX) 40 MG tablet Take 1 tablet (40 mg total) by mouth 2 (two) times daily. 60 tablet 3   penicillin v potassium (VEETID) 500 MG tablet Take 1 tablet (500 mg total) by mouth 4 (four) times daily for 7 days. 28 tablet 0   prochlorperazine (COMPAZINE) 10 MG tablet Take 1 tablet (10 mg total) by mouth every 6 (six) hours as needed for nausea or vomiting. 30 tablet 1   No current facility-administered medications for this visit.    PHYSICAL EXAMINATION: ECOG PERFORMANCE STATUS: 1 - Symptomatic but completely ambulatory  There were no vitals filed for this visit. Wt Readings from Last 3 Encounters:  02/14/22 182 lb 8 oz (82.8 kg)  01/31/22 177 lb 4 oz (80.4 kg)  01/17/22 182 lb 11.2 oz (82.9 kg)     No vitals taken today, Exam not performed today  LABORATORY DATA:  I have reviewed the data as listed    Latest Ref Rng & Units 02/14/2022  8:08 AM 01/31/2022    7:51 AM 01/17/2022    8:17 AM  CBC  WBC 4.0 - 10.5 K/uL 26.7  25.5  22.1   Hemoglobin 13.0 - 17.0 g/dL 10.8  10.7  10.8   Hematocrit 39.0 - 52.0 % 33.6  32.7  32.4   Platelets 150 - 400 K/uL 181  170  202         Latest Ref Rng & Units 02/14/2022    8:08 AM 01/31/2022    7:51 AM 01/17/2022    8:17 AM  CMP  Glucose 70 - 99 mg/dL 106  131  109   BUN 6 - 20 mg/dL 8  10  12   $ Creatinine 0.61 - 1.24 mg/dL 0.79  0.89  0.75   Sodium 135 - 145 mmol/L 139  138  138   Potassium 3.5 - 5.1 mmol/L 3.9  3.7  3.8   Chloride 98 - 111 mmol/L 104   104  103   CO2 22 - 32 mmol/L 29  29  29   $ Calcium 8.9 - 10.3 mg/dL 9.0  8.8  9.1   Total Protein 6.5 - 8.1 g/dL 5.9  5.9  6.0   Total Bilirubin 0.3 - 1.2 mg/dL 0.2  0.3  0.2   Alkaline Phos 38 - 126 U/L 102  92  90   AST 15 - 41 U/L 16  15  15   $ ALT 0 - 44 U/L 17  17  18       $ RADIOGRAPHIC STUDIES: I have personally reviewed the radiological images as listed and agreed with the findings in the report. No results found.    Orders Placed This Encounter  Procedures   CT Biopsy    Standing Status:   Future    Standing Expiration Date:   02/21/2023    Order Specific Question:   Lab orders requested (DO NOT place separate lab orders, these will be automatically ordered during procedure specimen collection):    Answer:   Surgical Pathology    Order Specific Question:   Reason for Exam (SYMPTOM  OR DIAGNOSIS REQUIRED)    Answer:   left adrenal nodule, rule out metastasis    Order Specific Question:   Preferred location?    Answer:   [facility]   All questions were answered. The patient knows to call the clinic with any problems, questions or concerns. No barriers to learning was detected. The total time spent in the appointment was 22 minutes.     Truitt Merle, MD 02/21/2022   Felicity Coyer am acting as scribe for Truitt Merle, MD.   I have reviewed the above documentation for accuracy and completeness, and I agree with the above.

## 2022-02-21 ENCOUNTER — Other Ambulatory Visit (HOSPITAL_COMMUNITY): Payer: Self-pay

## 2022-02-21 ENCOUNTER — Encounter: Payer: Self-pay | Admitting: Hematology

## 2022-02-21 ENCOUNTER — Emergency Department (HOSPITAL_COMMUNITY)
Admission: EM | Admit: 2022-02-21 | Discharge: 2022-02-21 | Disposition: A | Discharge status: Home / Self Care | Payer: BC Managed Care – PPO | Attending: Emergency Medicine | Admitting: Emergency Medicine

## 2022-02-21 ENCOUNTER — Inpatient Hospital Stay (HOSPITAL_BASED_OUTPATIENT_CLINIC_OR_DEPARTMENT_OTHER): Payer: BC Managed Care – PPO | Admitting: Hematology

## 2022-02-21 DIAGNOSIS — C163 Malignant neoplasm of pyloric antrum: Secondary | ICD-10-CM | POA: Diagnosis not present

## 2022-02-21 DIAGNOSIS — K0889 Other specified disorders of teeth and supporting structures: Secondary | ICD-10-CM | POA: Diagnosis present

## 2022-02-21 DIAGNOSIS — K029 Dental caries, unspecified: Secondary | ICD-10-CM | POA: Insufficient documentation

## 2022-02-21 MED ORDER — PENICILLIN V POTASSIUM 500 MG PO TABS
500.0000 mg | ORAL_TABLET | Freq: Four times a day (QID) | ORAL | 0 refills | Status: DC
  Filled 2022-02-21: qty 28, 7d supply, fill #0

## 2022-02-21 MED ORDER — LIDOCAINE VISCOUS HCL 2 % MT SOLN: 15.0000 mL | Freq: Three times a day (TID) | OROMUCOSAL | 0 refills | Status: DC

## 2022-02-21 MED ORDER — LIDOCAINE VISCOUS HCL 2 % MT SOLN
15.0000 mL | Freq: Once | OROMUCOSAL | Status: AC
  Administered 2022-02-21: 15 mL via OROMUCOSAL
  Filled 2022-02-21: qty 15

## 2022-02-21 MED ORDER — PENICILLIN V POTASSIUM 500 MG PO TABS
500.0000 mg | ORAL_TABLET | Freq: Once | ORAL | Status: AC
  Administered 2022-02-21: 500 mg via ORAL
  Filled 2022-02-21: qty 1

## 2022-02-21 MED ORDER — PENICILLIN V POTASSIUM 500 MG PO TABS: 500.0000 mg | ORAL_TABLET | Freq: Four times a day (QID) | ORAL | 0 refills | Status: AC

## 2022-02-21 MED ORDER — LIDOCAINE VISCOUS HCL 2 % MT SOLN
15.0000 mL | Freq: Three times a day (TID) | OROMUCOSAL | 0 refills | Status: DC
  Filled 2022-02-21: qty 100, 2d supply, fill #0

## 2022-02-21 NOTE — ED Triage Notes (Signed)
Patient arrived with left sided dental pain that started today, states a piece is chipped off. Taking tylenol for pain, last dose 8pm.

## 2022-02-21 NOTE — ED Provider Notes (Signed)
[place] EMERGENCY DEPARTMENT AT [facility] Provider Note   CSN: [id number] Arrival date & time: 02/21/22  [id number]     History  Chief Complaint  Patient presents with   Dental Pain    Jonathan Maynard is a 52 y.o. male.  The history is provided by the patient.  Dental Pain Location:  Upper Upper teeth location:  12/LU 1st bicuspid Quality:  Aching Severity:  Severe Timing:  Constant Progression:  Unchanged Chronicity:  New Context: crown fracture, dental caries and enamel fracture   Previous work-up:  Dental exam Relieved by:  Nothing Worsened by:  Nothing Ineffective treatments:  None tried Associated symptoms: no drooling, no facial swelling, no fever, no neck pain, no neck swelling, no oral bleeding, no oral lesions and no trismus   Risk factors: no alcohol problem        Home Medications Prior to Admission medications   Medication Sig Start Date End Date Taking? Authorizing Provider  dexamethasone (DECADRON) 4 MG tablet Take 1 tablet by mouth twice a day the day before chemo.  Then take 1 tablet by mouth once daily for 2 days after chemo. 01/03/22   Truitt Merle, MD  ferrous sulfate 325 (65 FE) MG tablet Take 1 tablet (325 mg total) by mouth daily. 04/16/21 04/16/22  Elodia Florence., MD  lidocaine-prilocaine (EMLA) cream Apply to affected area as directed once Patient not taking: Reported on 01/03/2022 11/11/21   Truitt Merle, MD  magic mouthwash (lidocaine, diphenhydrAMINE, alum & mag hydroxide) suspension Swish and spit 15 mLs 3 (three) times daily. 02/21/22   Keyshun Elpers, MD  magic mouthwash (multi-ingredient) oral suspension Swish, gargle and spit 5-10 mls by mouth every 6 hours as needed 02/14/22   Truitt Merle, MD  magic mouthwash SOLN Take 5 mLs by mouth 4 (four) times daily as needed for mouth pain. Swish, gargle, and spit one to two teaspoonfuls for 1 minute. Repeat every 6 hours as needed. 02/14/22   Truitt Merle, MD  ondansetron (ZOFRAN) 8 MG  tablet Take 1 tablet (8 mg total) by mouth every 8 (eight) hours as needed for nausea or vomiting. Start on the third day after chemotherapy. 11/11/21   Truitt Merle, MD  pantoprazole (PROTONIX) 40 MG tablet Take 1 tablet (40 mg total) by mouth 2 (two) times daily. 10/23/21     penicillin v potassium (VEETID) 500 MG tablet Take 1 tablet (500 mg total) by mouth 4 (four) times daily for 7 days. 02/21/22 02/28/22  Marsella Suman, MD  prochlorperazine (COMPAZINE) 10 MG tablet Take 1 tablet (10 mg total) by mouth every 6 (six) hours as needed for nausea or vomiting. 01/03/22   Truitt Merle, MD      Allergies    Patient has no known allergies.    Review of Systems   Review of Systems  Constitutional:  Negative for fever.  HENT:  Positive for dental problem. Negative for drooling, facial swelling and mouth sores.   Respiratory:  Negative for wheezing and stridor.   Musculoskeletal:  Negative for neck pain and neck stiffness.  All other systems reviewed and are negative.   Physical Exam Updated Vital Signs BP (!) 154/95   Pulse 97   Temp 98.5 F (36.9 C) (Oral)   Resp 16   SpO2 100%  Physical Exam Vitals and nursing note reviewed.  Constitutional:      General: He is not in acute distress.    Appearance: He is well-developed. He is not diaphoretic.  HENT:     Head: Normocephalic and atraumatic.     Nose: Nose normal.     Mouth/Throat:     Mouth: Mucous membranes are moist.     Pharynx: Oropharynx is clear.     Comments: 3/4 of tooth 12 missing poor dentition Eyes:     Conjunctiva/sclera: Conjunctivae normal.     Pupils: Pupils are equal, round, and reactive to light.  Cardiovascular:     Rate and Rhythm: Normal rate and regular rhythm.     Pulses: Normal pulses.     Heart sounds: Normal heart sounds.  Pulmonary:     Effort: Pulmonary effort is normal.     Breath sounds: Normal breath sounds. No wheezing or rales.  Abdominal:     General: Bowel sounds are normal.     Palpations:  Abdomen is soft.     Tenderness: There is no abdominal tenderness. There is no guarding or rebound.  Musculoskeletal:        General: Normal range of motion.     Cervical back: Normal range of motion and neck supple.  Skin:    General: Skin is warm and dry.     Capillary Refill: Capillary refill takes less than 2 seconds.  Neurological:     General: No focal deficit present.     Mental Status: He is alert and oriented to person, place, and time.     Deep Tendon Reflexes: Reflexes normal.  Psychiatric:        Mood and Affect: Mood normal.        Behavior: Behavior normal.     ED Results / Procedures / Treatments   Labs (all labs ordered are listed, but only abnormal results are displayed) Labs Reviewed - No data to display  EKG None  Radiology MR Abdomen Wo Contrast  Result Date: 02/19/2022 CLINICAL DATA:  History of gastric cancer, further evaluation of left adrenal nodule EXAM: MRI ABDOMEN WITHOUT CONTRAST TECHNIQUE: Multiplanar multisequence MR imaging was performed without the administration of intravenous contrast. COMPARISON:  Multiple priors including PET-CT February 07, 2022 and CT abdomen pelvis Shaneca Orne 7, 2023 FINDINGS: Lower chest: No acute abnormality. Hepatobiliary: No suspicious hepatic lesion on noncontrast enhanced examination. Gallbladder is unremarkable no biliary ductal dilation. Pancreas: No pancreatic ductal dilation or evidence of acute inflammation. Spleen:  No splenomegaly. Adrenals/Urinary Tract: 16 mm left adrenal nodule measures intensity of 150 inphase and 148 out of phase. No right adrenal nodule. No hydronephrosis. Stomach/Bowel: Masslike thickening of the gastric antrum/pylorus corresponds with the areas of hypermetabolism seen on prior imaging and reflects patient's gastric neoplasm. No pathologic dilation or evidence of acute inflammation involving loops of large or small bowel in the abdomen. Vascular/Lymphatic: Normal caliber abdominal aorta. Smooth IVC  contours. Previously indexed porta hepatis lymph node measures 6 mm in short axis on image 21/3. Other:  No significant abdominal free fluid. Musculoskeletal: No suspicious bone lesions identified. IMPRESSION: 1. Left adrenal nodule measuring 16 mm does not demonstrate loss of signal on out of phase imaging differential considerations include metastasis or lipid poor adenoma. Assessment of long-term stability with comparison to more remote prior imaging would be highly beneficial for more definitive characterization. Alternately further evaluation could be obtained on imaging with pre and postcontrast enhanced adrenal protocol CT of the abdomen to assess washout characteristics of the lesion. Additionally direct tissue sampling could be considered for definitive histologic characterization. 2. Masslike thickening of the gastric antrum/pylorus corresponds with the areas of hypermetabolism seen on prior imaging and  reflects patient's known gastric neoplasm. Electronically Signed   By: Dahlia Bailiff M.D.   On: 02/19/2022 08:41    Procedures Procedures    Medications Ordered in ED Medications  lidocaine (XYLOCAINE) 2 % viscous mouth solution 15 mL (15 mLs Mouth/Throat Given 02/21/22 0323)  penicillin v potassium (VEETID) tablet 500 mg (500 mg Oral Given 02/21/22 [id number])    ED Course/ Medical Decision Making/ A&P                             Medical Decision Making Patient is a tooth fracture of tooth number 12.    Amount and/or Complexity of Data Reviewed External Data Reviewed: notes.    Details: Previous notes reviewed   Risk Prescription drug management. Risk Details: Patient with dental fracture without facial or neck swelling no trismus, poor dentition.  Inform oncology of the dental fracture.  Follow up with dentistry for ongoing testing and treatment.  Stable for discharge,  strict return.      Final Clinical Impression(s) / ED Diagnoses Final diagnoses:  Pain due to dental caries    Return for intractable cough, coughing up blood, fevers > 100.4 unrelieved by medication, shortness of breath, intractable vomiting, chest pain, shortness of breath, weakness, numbness, changes in speech, facial asymmetry, abdominal pain, passing out, Inability to tolerate liquids or food, cough, altered mental status or any concerns. No signs of systemic illness or infection. The patient is nontoxic-appearing on exam and vital signs are within normal limits.  I have reviewed the triage vital signs and the nursing notes. Pertinent labs & imaging results that were available during my care of the patient were reviewed by me and considered in my medical decision making (see chart for details). After history, exam, and medical workup I feel the patient has been appropriately medically screened and is safe for discharge home. Pertinent diagnoses were discussed with the patient. Patient was given return precautions.            Hardeep Reetz, MD 02/21/22 [phone]

## 2022-02-21 NOTE — Assessment & Plan Note (Signed)
stage III, cT3N3Mx, with hypermetabolic left supraclavicular node, and indeterminate adrenal nodule, MMR normal -diagnosed 10/18/21 by EGD for 6 month f/u of gastric ulcers. Procedure showed mucosal intramucosal adenocarcinoma. MMR normal. -baseline CEA 11/01/21 significantly elevated at 1,989.58. -he met Dr. Kieth Brightly on 11/05/21. -EUS on 11/06/21 by Dr. Paulita Fujita, staged as T3 N3 (at least stage III) -staging CT CAP 11/08/21 showed: stable gastric antrum wall thickening; increased size of upper abdominal lymph nodes; stable left adrenal nodule, 1.9 cm. -he started neoadjuvant FLOT on 11/17.  -PET scan showed FDG avid lymph nodes within the gastrohepatic ligament, portacaval region, and mesentery. Tracer avid left supraclavicular lymph node and indeterminate left adrenal gland nodule with mild FDG uptake. -he underwent [place] node biopsy on 1/17 which was negative for malignant cells, but no lymphoid tissue seen on biopsy  -he is tolerating chemo FLOT well, will continue -repeated PET on 02/07/22 showed resolved left supraclavicular lymph node, and significant improvement in regional lymph nodes and the primary tumor, stable and indeterminate left adrenal nodule.  -abdominal MRI on 02/19/22 showed Left adrenal nodule measuring 16 mm does not demonstrate loss of signal on out of phase imaging differential considerations include metastasis or lipid poor adenoma.  Given the concern of metastasis, I recommend IR biopsy.

## 2022-02-24 ENCOUNTER — Encounter: Payer: Self-pay | Admitting: Hematology

## 2022-02-24 ENCOUNTER — Other Ambulatory Visit: Payer: Self-pay | Admitting: Hematology

## 2022-02-24 ENCOUNTER — Other Ambulatory Visit (HOSPITAL_COMMUNITY): Payer: Self-pay

## 2022-02-24 DIAGNOSIS — C163 Malignant neoplasm of pyloric antrum: Secondary | ICD-10-CM

## 2022-02-24 NOTE — Addendum Note (Signed)
Addended by: Truitt Merle on: 02/24/2022 03:16 PM   Modules accepted: Orders

## 2022-02-24 NOTE — Progress Notes (Unsigned)
[name], MD  Donita Brooks D This biopsy is canceled for now, discussed with Burr Medico.  [place]

## 2022-02-25 ENCOUNTER — Other Ambulatory Visit: Payer: Self-pay

## 2022-02-25 ENCOUNTER — Other Ambulatory Visit (HOSPITAL_COMMUNITY): Payer: Self-pay

## 2022-02-25 ENCOUNTER — Telehealth: Payer: Self-pay

## 2022-02-25 NOTE — Telephone Encounter (Addendum)
Called patient to make him aware of his appointment for the CT scan on 2/24 at 3pm their was no answer, so I sent him a my chart message to let him know. Also tried to call wife with no answer.   ----- Message from Evalee Jefferson, RN sent at 02/24/2022  4:22 PM EST ----- Please call to get CT Scan scheduled. ----- Message ----- From: Truitt Merle, MD Sent: 02/24/2022   9:17 AM EST To: Gardiner Coins; Evalee Jefferson, RN  IR Dr. Anselm Pancoast recommend CT adrenal protocol to evaluate before biopsy. I ordered the scan, please get it approved and scheduled ASAP, please let pt know that also, thx   Krista Blue

## 2022-02-27 MED FILL — Dexamethasone Sodium Phosphate Inj 100 MG/10ML: INTRAMUSCULAR | Qty: 1 | Status: AC

## 2022-02-27 NOTE — Assessment & Plan Note (Signed)
stage III, cT3N3Mx, with hypermetabolic left supraclavicular node, and indeterminate adrenal nodule, MMR normal -diagnosed 10/18/21 by EGD for 6 month f/u of gastric ulcers. Procedure showed mucosal intramucosal adenocarcinoma. MMR normal. -baseline CEA 11/01/21 significantly elevated at 1,989.58. -he met Dr. Kieth Brightly on 11/05/21. -EUS on 11/06/21 by Dr. Paulita Fujita, staged as T3 N3 (at least stage III) -staging CT CAP 11/08/21 showed: stable gastric antrum wall thickening; increased size of upper abdominal lymph nodes; stable left adrenal nodule, 1.9 cm. -he started neoadjuvant FLOT on 11/17.  -PET scan showed FDG avid lymph nodes within the gastrohepatic ligament, portacaval region, and mesentery. Tracer avid left supraclavicular lymph node and indeterminate left adrenal gland nodule with mild FDG uptake. -he underwent [place] node biopsy on 1/17 which was negative for malignant cells, but no lymphoid tissue seen on biopsy  -he is tolerating chemo FLOT well, will continue -repeated PET on 02/07/22 showed resolved left supraclavicular lymph node, and significant improvement in regional lymph nodes and the primary tumor, stable and indeterminate left adrenal nodule.  -abdominal MRI on 02/19/22 showed Left adrenal nodule measuring 16 mm does not demonstrate loss of signal on out of phase imaging differential considerations include metastasis or lipid poor adenoma.  I again discussed with IR and CT adrenal protocol was recommended, which is scheduled for next week -continue chemo

## 2022-02-28 ENCOUNTER — Other Ambulatory Visit (HOSPITAL_COMMUNITY): Payer: Self-pay

## 2022-02-28 ENCOUNTER — Inpatient Hospital Stay: Payer: BC Managed Care – PPO

## 2022-02-28 ENCOUNTER — Inpatient Hospital Stay (HOSPITAL_BASED_OUTPATIENT_CLINIC_OR_DEPARTMENT_OTHER): Payer: BC Managed Care – PPO | Admitting: Hematology

## 2022-02-28 ENCOUNTER — Encounter: Payer: Self-pay | Admitting: Hematology

## 2022-02-28 VITALS — BP 102/71 | HR 87 | Temp 98.7°F | Resp 18 | Ht 65.0 in | Wt 183.2 lb

## 2022-02-28 DIAGNOSIS — C163 Malignant neoplasm of pyloric antrum: Secondary | ICD-10-CM

## 2022-02-28 DIAGNOSIS — Z5111 Encounter for antineoplastic chemotherapy: Secondary | ICD-10-CM | POA: Diagnosis not present

## 2022-02-28 DIAGNOSIS — Z95828 Presence of other vascular implants and grafts: Secondary | ICD-10-CM

## 2022-02-28 LAB — CBC WITH DIFFERENTIAL (CANCER CENTER ONLY)
Abs Immature Granulocytes: 3.8 10*3/uL — ABNORMAL HIGH (ref 0.00–0.07)
Basophils Absolute: 0 10*3/uL (ref 0.0–0.1)
Basophils Relative: 0 %
Eosinophils Absolute: 0 10*3/uL (ref 0.0–0.5)
Eosinophils Relative: 0 %
HCT: 32.7 % — ABNORMAL LOW (ref 39.0–52.0)
Hemoglobin: 10.5 g/dL — ABNORMAL LOW (ref 13.0–17.0)
Immature Granulocytes: 13 %
Lymphocytes Relative: 17 %
Lymphs Abs: 5 10*3/uL — ABNORMAL HIGH (ref 0.7–4.0)
MCH: 28.2 pg (ref 26.0–34.0)
MCHC: 32.1 g/dL (ref 30.0–36.0)
MCV: 87.9 fL (ref 80.0–100.0)
Monocytes Absolute: 1.7 10*3/uL — ABNORMAL HIGH (ref 0.1–1.0)
Monocytes Relative: 6 %
Neutro Abs: 18.4 10*3/uL — ABNORMAL HIGH (ref 1.7–7.7)
Neutrophils Relative %: 64 %
Platelet Count: 214 10*3/uL (ref 150–400)
RBC: 3.72 MIL/uL — ABNORMAL LOW (ref 4.22–5.81)
RDW: 19.8 % — ABNORMAL HIGH (ref 11.5–15.5)
Smear Review: NORMAL
WBC Count: 29 10*3/uL — ABNORMAL HIGH (ref 4.0–10.5)
nRBC: 1 % — ABNORMAL HIGH (ref 0.0–0.2)

## 2022-02-28 LAB — CMP (CANCER CENTER ONLY)
ALT: 15 U/L (ref 0–44)
AST: 15 U/L (ref 15–41)
Albumin: 3.3 g/dL — ABNORMAL LOW (ref 3.5–5.0)
Alkaline Phosphatase: 100 U/L (ref 38–126)
Anion gap: 7 (ref 5–15)
BUN: 10 mg/dL (ref 6–20)
CO2: 27 mmol/L (ref 22–32)
Calcium: 8.3 mg/dL — ABNORMAL LOW (ref 8.9–10.3)
Chloride: 107 mmol/L (ref 98–111)
Creatinine: 0.78 mg/dL (ref 0.61–1.24)
GFR, Estimated: >60 mL/min (ref >60)
Glucose, Bld: 128 mg/dL — ABNORMAL HIGH (ref 70–99)
Potassium: 3.6 mmol/L (ref 3.5–5.1)
Sodium: 141 mmol/L (ref 135–145)
Total Bilirubin: 0.2 mg/dL — ABNORMAL LOW (ref 0.3–1.2)
Total Protein: 5.8 g/dL — ABNORMAL LOW (ref 6.5–8.1)

## 2022-02-28 LAB — CEA (IN HOUSE-CHCC): CEA (CHCC-In House): 27.53 ng/mL — ABNORMAL HIGH (ref 0.00–5.00)

## 2022-02-28 MED ORDER — SODIUM CHLORIDE 0.9 % IV SOLN
2400.0000 mg/m2 | INTRAVENOUS | Status: DC
  Administered 2022-02-28: 5000 mg via INTRAVENOUS
  Filled 2022-02-28: qty 100

## 2022-02-28 MED ORDER — LEUCOVORIN CALCIUM INJECTION 350 MG
200.0000 mg/m2 | Freq: Once | INTRAVENOUS | Status: AC
  Administered 2022-02-28: 390 mg via INTRAVENOUS
  Filled 2022-02-28: qty 19.5

## 2022-02-28 MED ORDER — SODIUM CHLORIDE 0.9 % IV SOLN: INTRAVENOUS | Status: DC

## 2022-02-28 MED ORDER — SODIUM CHLORIDE 0.9% FLUSH
10.0000 mL | Freq: Once | INTRAVENOUS | Status: AC
  Administered 2022-02-28: 10 mL

## 2022-02-28 MED ORDER — SODIUM CHLORIDE 0.9 % IV SOLN
10.0000 mg | Freq: Once | INTRAVENOUS | Status: AC
  Administered 2022-02-28: 10 mg via INTRAVENOUS
  Filled 2022-02-28: qty 10

## 2022-02-28 MED ORDER — PALONOSETRON HCL INJECTION 0.25 MG/5ML
0.2500 mg | Freq: Once | INTRAVENOUS | Status: AC
  Administered 2022-02-28: 0.25 mg via INTRAVENOUS
  Filled 2022-02-28: qty 5

## 2022-02-28 MED ORDER — SODIUM CHLORIDE 0.9 % IV SOLN
50.0000 mg/m2 | Freq: Once | INTRAVENOUS | Status: AC
  Administered 2022-02-28: 98 mg via INTRAVENOUS
  Filled 2022-02-28: qty 9.8

## 2022-02-28 MED ORDER — DEXTROSE 5 % IV SOLN: Freq: Once | INTRAVENOUS | Status: AC

## 2022-02-28 MED ORDER — OXALIPLATIN CHEMO INJECTION 100 MG/20ML
85.0000 mg/m2 | Freq: Once | INTRAVENOUS | Status: AC
  Administered 2022-02-28: 165 mg via INTRAVENOUS
  Filled 2022-02-28: qty 33

## 2022-02-28 NOTE — Patient Instructions (Signed)
[facility]  Discharge Instructions: Thank you for choosing [facility] to provide your oncology and hematology care.   If you have a lab appointment with the [facility], please go directly to the [facility] and check in at the registration area.   Wear comfortable clothing and clothing appropriate for easy access to any Portacath or PICC line.   We strive to give you quality time with your provider. You may need to reschedule your appointment if you arrive late (15 or more minutes).  Arriving late affects you and other patients whose appointments are after yours.  Also, if you miss three or more appointments without notifying the office, you may be dismissed from the clinic at the provider's discretion.      For prescription refill requests, have your pharmacy contact our office and allow 72 hours for refills to be completed.    Today you received the following chemotherapy and/or immunotherapy agents: Docetaxel, Oxaliplatin, Fluorouracil       To help prevent nausea and vomiting after your treatment, we encourage you to take your nausea medication as directed.  BELOW ARE SYMPTOMS THAT SHOULD BE REPORTED IMMEDIATELY: *FEVER GREATER THAN 100.4 F (38 C) OR HIGHER *CHILLS OR SWEATING *NAUSEA AND VOMITING THAT IS NOT CONTROLLED WITH YOUR NAUSEA MEDICATION *UNUSUAL SHORTNESS OF BREATH *UNUSUAL BRUISING OR BLEEDING *URINARY PROBLEMS (pain or burning when urinating, or frequent urination) *BOWEL PROBLEMS (unusual diarrhea, constipation, pain near the anus) TENDERNESS IN MOUTH AND THROAT WITH OR WITHOUT PRESENCE OF ULCERS (sore throat, sores in mouth, or a toothache) UNUSUAL RASH, SWELLING OR PAIN  UNUSUAL VAGINAL DISCHARGE OR ITCHING   Items with * indicate a potential emergency and should be followed up as soon as possible or go to the Emergency Department if any problems should occur.  Please show the CHEMOTHERAPY ALERT CARD or  IMMUNOTHERAPY ALERT CARD at check-in to the Emergency Department and triage nurse.  Should you have questions after your visit or need to cancel or reschedule your appointment, please contact [facility]  Dept: [phone]  and follow the prompts.  Office hours are 8:00 a.m. to 4:30 p.m. Monday - Friday. Please note that voicemails left after 4:00 p.m. may not be returned until the following business day.  We are closed weekends and major holidays. You have access to a nurse at all times for urgent questions. Please call the main number to the clinic Dept: [phone] and follow the prompts.   For any non-urgent questions, you may also contact your provider using MyChart. We now offer e-Visits for anyone 29 and older to request care online for non-urgent symptoms. For details visit [email or web address].   Also download the MyChart app! Go to the app store, search "MyChart", open the app, select [facility], and log in with your MyChart username and password.

## 2022-02-28 NOTE — Progress Notes (Signed)
[facility]   [phone] [phone]) [phone]   Clinic Follow up Note   Patient Care Team: Pcp, No as PCP - General Truitt Merle, MD as Consulting Physician (Hematology and Oncology)  Date of Service:  02/28/2022  CHIEF COMPLAINT: f/u of gastric cancer    CURRENT THERAPY:   Neoadjuvant FLOT, q14d, starting 11/22/21    ASSESSMENT:  Jonathan Maynard is a 52 y.o. male with   Gastric cancer ([place]) stage III, cT3N3Mx, with hypermetabolic left supraclavicular node, and indeterminate adrenal nodule, MMR normal -diagnosed 10/18/21 by EGD for 6 month f/u of gastric ulcers. Procedure showed mucosal intramucosal adenocarcinoma. MMR normal. -baseline CEA 11/01/21 significantly elevated at 1,989.58. -he met Dr. Kieth Brightly on 11/05/21. -EUS on 11/06/21 by Dr. Paulita Fujita, staged as T3 N3 (at least stage III) -staging CT CAP 11/08/21 showed: stable gastric antrum wall thickening; increased size of upper abdominal lymph nodes; stable left adrenal nodule, 1.9 cm. -he started neoadjuvant FLOT on 11/17.  -PET scan showed FDG avid lymph nodes within the gastrohepatic ligament, portacaval region, and mesentery. Tracer avid left supraclavicular lymph node and indeterminate left adrenal gland nodule with mild FDG uptake. -he underwent [place] node biopsy on 1/17 which was negative for malignant cells, but no lymphoid tissue seen on biopsy  -he is tolerating chemo FLOT well, will continue -repeated PET on 02/07/22 showed resolved left supraclavicular lymph node, and significant improvement in regional lymph nodes and the primary tumor, stable and indeterminate left adrenal nodule.  -abdominal MRI on 02/19/22 showed Left adrenal nodule measuring 16 mm does not demonstrate loss of signal on out of phase imaging differential considerations include metastasis or lipid poor adenoma.  I again discussed with IR and CT adrenal protocol was recommended, which is scheduled for next week -continue chemo      PLAN: -lab reviewed  -proceed with  C8 FLOT reduce  dose of 5-fu due to skin toxicity  -CT scan 03/01/2022, will determine if he needs left adrenal mass biopsy based on CT results. -f/u in 2 weeks    SUMMARY OF ONCOLOGIC HISTORY: Oncology History Overview Note   Cancer Staging  Gastric cancer [facility]) Staging form: Stomach, AJCC 8th Edition - Clinical stage from 10/18/2021: Stage III (cT3, cN3a, cM0) - Signed by Truitt Merle, MD on 11/10/2021     Gastric cancer ([place])  10/18/2021 Procedure   EGD performed under the care of Dr. Michail Sermon  Findings:  -A large, ulcerated, partially circumferential (involving two thirds of the lumen circumference) mass with no bleeding and stigmata of recent bleeding was found in the gastric antrum, at the pylorus and in the prepyloric region of the stomach. -Segmental severe inflammation characterized by congestion (edema) and erythema was found in the gastric antrum.   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 Negative for dysplasia.  Negative for helicobacter pylori  Mismatch Repair (MMR) Protein Imunohistochemistry (IHC): IHC Expression Result: MLH1: Preserved nuclear expression. MSH2: Preserved nuclear expression. MSH6: Preserved nuclear expression. PMS2: Preserved nuclear expression. Interpretation: NORMAL   10/31/2021 Initial Diagnosis   Gastric cancer ([place])   11/01/2021 Tumor Marker   CEA = 1,989.58 (^)  11/06/2021 Procedure   Upper EUS, Dr. Paulita Fujita  Impression: - Normal esophagus. - A small amount of food  (residue) in the stomach. - Congested, friable (with contact bleeding) and ulcerated mucosa in the prepyloric region of the stomach. - Normal examined duodenum. - There was no evidence of significant pathology in the left lobe of the liver. - Many abnormal lymph nodes (over 10) were visualized in the gastrohepatic ligament (level 18), celiac region (level 20), perigastric region, peripancreatic region and aortocaval region. - Wall thickening was seen in the prepyloric region of the stomach. The thickening appeared to be primarily within the deep mucosa (Layer 2) but extended through the muscularis propria. - Overall constellation of findings consistent with at least T3 N3 Mx (at least stage III) gastric adenocarcinoma.   11/08/2021 Imaging   EXAM: CT CHEST, ABDOMEN, AND PELVIS WITH CONTRAST  IMPRESSION: 1. Similar irregular wall thickening of the gastric antrum, consistent with patient's known primary gastric neoplasm. 2. Increased size of the upper abdominal lymph nodes, concerning for worsening nodal disease involvement. 3. Stable left adrenal nodule common nonspecific possibly a metastatic lesion or adenoma. 4. No evidence of metastatic disease in the chest. 5. Left-sided colonic diverticulosis without findings of acute diverticulitis. 6. Enlarged prostate gland. 7.  Aortic Atherosclerosis (ICD10-I70.0).   11/22/2021 -  Chemotherapy   Patient is on Treatment Plan : GASTROESOPHAGEAL FLOT q14d X 4 cycles     11/27/2021 Imaging    IMPRESSION: 1. The mass within the gastric antrum is mildly decreased in size when compared with 11/08/2021. There is persistent FDG uptake associated with this mass compatible with residual tumor. 2. Persistent FDG avid lymph nodes within the gastrohepatic ligament, portacaval region, and mesentery. These are mildly decreased in size when compared with 11/08/2021. 3. Tracer avid left supraclavicular lymph node is also mildly decreased in size when  compared with 11/08/2021. 4. Indeterminate left adrenal gland nodule with mild FDG uptake. This may represent a lipid poor adenoma. Metastatic disease not exclude. 5. Diffuse increased uptake throughout the bone marrow is favored to represent treatment related change. 6.  Aortic Atherosclerosis (ICD10-I70.0).     02/07/2022 Imaging    IMPRESSION: 1. No substantial change in hypermetabolism associated with the distal gastric lesion. Although difficult to discern on noncontrast CT imaging, the soft tissue component appears to be decreased since the prior PET-CT. 2. Interval resolution of the hypermetabolic left supraclavicular lymph node. 3. Interval marked decrease of the hypermetabolic gastrohepatic ligament lymphadenopathy. Similar decrease in size and hypermetabolism associated with index nodes identified previously in the 4. porta hepatis and hypogastric region. 5. Stable left adrenal nodule with slight hypermetabolism. Nonspecific finding could represent lipid poor adenoma or metastatic deposit. 6. No new suspicious hypermetabolic disease on today's study. 7.  Aortic Atherosclerosis (ICD10-I70.0).      INTERVAL HISTORY:  Jonathan Maynard is here for a follow up of gastric cancer   He was last seen by me on  He presents to the clinic he has recover well from the last treatment. Pt took 2 days off cause of fatigue after treatment. Pt has some skin peeling and some cracking on his hands.Pt denies having numbness and tingling.        All other systems were reviewed with the patient and are negative.  MEDICAL HISTORY:  Past Medical History:  Diagnosis Date   Acute upper GI bleed 04/12/2021   Class 1 obesity 04/12/2021   Diverticulosis 04/12/2021   Hepatic steatosis 04/12/2021    SURGICAL HISTORY:  Past Surgical History:  Procedure Laterality Date   BIOPSY  04/12/2021   Procedure: BIOPSY;  Surgeon: Wilford Corner, MD;  Location: [facility] END[facility];  Service:  Gastroenterology;;   ESOPHAGOGASTRODUODENOSCOPY N/A 04/12/2021   Procedure: ESOPHAGOGASTRODUODENOSCOPY (EGD);  Surgeon: Wilford Corner, MD;  Location: [facility];  Service: Gastroenterology;  Laterality: N/A;   ESOPHAGOGASTRODUODENOSCOPY N/A 11/06/2021   Procedure: ESOPHAGOGASTRODUODENOSCOPY (EGD);  Surgeon: Arta Silence, MD;  Location: [facility];  Service: Gastroenterology;  Laterality: N/A;   HERNIA REPAIR     IR IMAGING GUIDED PORT INSERTION  11/15/2021   UPPER ESOPHAGEAL ENDOSCOPIC ULTRASOUND (EUS) Bilateral 11/06/2021   Procedure: UPPER ESOPHAGEAL ENDOSCOPIC ULTRASOUND (EUS);  Surgeon: Arta Silence, MD;  Location: [facility];  Service: Gastroenterology;  Laterality: Bilateral;    I have reviewed the social history and family history with the patient and they are unchanged from previous note.  ALLERGIES:  has No Known Allergies.  MEDICATIONS:  Current Outpatient Medications  Medication Sig Dispense Refill   dexamethasone (DECADRON) 4 MG tablet Take 1 tablet by mouth twice a day the day before chemo.  Then take 1 tablet by mouth once daily for 2 days after chemo. 30 tablet 1   ferrous sulfate 325 (65 FE) MG tablet Take 1 tablet (325 mg total) by mouth daily. 30 tablet 3   lidocaine-prilocaine (EMLA) cream Apply to affected area as directed once (Patient not taking: Reported on 01/03/2022) 30 g 3   magic mouthwash (lidocaine, diphenhydrAMINE, alum & mag hydroxide) suspension Swish and spit 15 mLs 3 (three) times daily. 100 mL 0   magic mouthwash (multi-ingredient) oral suspension Swish, gargle and spit 5-10 mls by mouth every 6 hours as needed 480 mL 0   magic mouthwash SOLN Take 5 mLs by mouth 4 (four) times daily as needed for mouth pain. Swish, gargle, and spit one to two teaspoonfuls for 1 minute. Repeat every 6 hours as needed. 480 mL 0   ondansetron (ZOFRAN) 8 MG tablet Take 1 tablet (8 mg total) by mouth every 8 (eight) hours as needed for nausea or vomiting. Start on the  third day after chemotherapy. 30 tablet 1   pantoprazole (PROTONIX) 40 MG tablet Take 1 tablet (40 mg total) by mouth 2 (two) times daily. 60 tablet 3   penicillin v potassium (VEETID) 500 MG tablet Take 1 tablet (500 mg total) by mouth 4 (four) times daily for 7 days. 28 tablet 0   prochlorperazine (COMPAZINE) 10 MG tablet Take 1 tablet (10 mg total) by mouth every 6 (six) hours as needed for nausea or vomiting. 30 tablet 1   No current facility-administered medications for this visit.    PHYSICAL EXAMINATION: ECOG PERFORMANCE STATUS: 1 - Symptomatic but completely ambulatory  Vitals:   02/28/22 0926  BP: 102/71  Pulse: 87  Resp: 18  Temp: 98.7 F (37.1 C)  SpO2: 100%   Wt Readings from Last 3 Encounters:  02/28/22 183 lb 3.2 oz (83.1 kg)  02/14/22 182 lb 8 oz (82.8 kg)  01/31/22 177 lb 4 oz (80.4 kg)     LUNGS:(-) clear to auscultation and percussion with normal breathing effort HEART:(-)  regular rate & rhythm and no murmurs and no lower extremity edema  LABORATORY DATA:  I have reviewed the data as listed    Latest Ref Rng & Units 02/28/2022    8:48 AM 02/14/2022    8:08 AM 01/31/2022    7:51 AM  CBC  WBC 4.0 - 10.5 K/uL 29.0  26.7  25.5  Hemoglobin 13.0 - 17.0 g/dL 10.5  10.8  10.7   Hematocrit 39.0 - 52.0 % 32.7  33.6  32.7   Platelets 150 - 400 K/uL 214  181  170         Latest Ref Rng & Units 02/14/2022    8:08 AM 01/31/2022    7:51 AM 01/17/2022    8:17 AM  CMP  Glucose 70 - 99 mg/dL 106  131  109   BUN 6 - 20 mg/dL '8  10  12   '$ Creatinine 0.61 - 1.24 mg/dL 0.79  0.89  0.75   Sodium 135 - 145 mmol/L 139  138  138   Potassium 3.5 - 5.1 mmol/L 3.9  3.7  3.8   Chloride 98 - 111 mmol/L 104  104  103   CO2 22 - 32 mmol/L '29  29  29   '$ Calcium 8.9 - 10.3 mg/dL 9.0  8.8  9.1   Total Protein 6.5 - 8.1 g/dL 5.9  5.9  6.0   Total Bilirubin 0.3 - 1.2 mg/dL 0.2  0.3  0.2   Alkaline Phos 38 - 126 U/L 102  92  90   AST 15 - 41 U/L '16  15  15   '$ ALT 0 - 44 U/L '17  17  18        '$ RADIOGRAPHIC STUDIES: I have personally reviewed the radiological images as listed and agreed with the findings in the report. No results found.    No orders of the defined types were placed in this encounter.  All questions were answered. The patient knows to call the clinic with any problems, questions or concerns. No barriers to learning was detected. The total time spent in the appointment was 30 minutes.     Truitt Merle, MD 02/28/2022   Felicity Coyer, CMA, am acting as scribe for Truitt Merle, MD.   I have reviewed the above documentation for accuracy and completeness, and I agree with the above.

## 2022-03-01 ENCOUNTER — Encounter: Payer: Self-pay | Admitting: Hematology

## 2022-03-01 ENCOUNTER — Encounter: Payer: Self-pay | Admitting: Physician Assistant

## 2022-03-01 ENCOUNTER — Inpatient Hospital Stay: Payer: BC Managed Care – PPO

## 2022-03-01 ENCOUNTER — Ambulatory Visit (HOSPITAL_BASED_OUTPATIENT_CLINIC_OR_DEPARTMENT_OTHER)
Admission: RE | Admit: 2022-03-01 | Discharge: 2022-03-01 | Disposition: A | Discharge status: Home / Self Care | Payer: BC Managed Care – PPO | Source: Ambulatory Visit | Attending: Hematology | Admitting: Hematology

## 2022-03-01 VITALS — BP 126/65 | HR 83 | Temp 99.7°F | Resp 16

## 2022-03-01 DIAGNOSIS — C163 Malignant neoplasm of pyloric antrum: Secondary | ICD-10-CM

## 2022-03-01 MED ORDER — PEGFILGRASTIM-JMDB 6 MG/0.6ML ~~LOC~~ SOSY
6.0000 mg | PREFILLED_SYRINGE | Freq: Once | SUBCUTANEOUS | Status: AC
  Administered 2022-03-01: 6 mg via SUBCUTANEOUS
  Filled 2022-03-01: qty 0.6

## 2022-03-01 MED ORDER — SODIUM CHLORIDE 0.9% FLUSH
10.0000 mL | INTRAVENOUS | Status: DC | PRN
  Administered 2022-03-01: 10 mL

## 2022-03-01 MED ORDER — IOHEXOL 300 MG/ML  SOLN
100.0000 mL | Freq: Once | INTRAMUSCULAR | Status: AC | PRN
  Administered 2022-03-01: 100 mL via INTRAVENOUS

## 2022-03-01 MED ORDER — HEPARIN SOD (PORK) LOCK FLUSH 100 UNIT/ML IV SOLN
500.0000 [IU] | Freq: Once | INTRAVENOUS | Status: AC | PRN
  Administered 2022-03-01: 500 [IU]

## 2022-03-02 ENCOUNTER — Other Ambulatory Visit: Payer: Self-pay

## 2022-03-03 ENCOUNTER — Telehealth: Payer: Self-pay | Admitting: Hematology

## 2022-03-03 NOTE — Telephone Encounter (Signed)
Contacted patient to scheduled appointments. Left message with appointment details and a call back number if patient had any questions or could not accommodate the time we provided.   

## 2022-03-04 ENCOUNTER — Encounter: Payer: Self-pay | Admitting: Hematology

## 2022-03-04 ENCOUNTER — Encounter: Payer: Self-pay | Admitting: Physician Assistant

## 2022-03-04 NOTE — Telephone Encounter (Signed)
I called Jonathan Maynard and was unable to verbally speak with him. I left a voice mail letting  him know his results and if he has any questions to call Dr. Ernestina Penna office at [phone]

## 2022-03-05 ENCOUNTER — Other Ambulatory Visit: Payer: Self-pay

## 2022-03-13 MED FILL — Dexamethasone Sodium Phosphate Inj 100 MG/10ML: INTRAMUSCULAR | Qty: 1 | Status: AC

## 2022-03-13 NOTE — Assessment & Plan Note (Signed)
stage III, cT3N3Mx, with hypermetabolic left supraclavicular node, and indeterminate adrenal nodule, MMR normal -diagnosed 10/18/21 by EGD for 6 month f/u of gastric ulcers. Procedure showed mucosal intramucosal adenocarcinoma. MMR normal. -baseline CEA 11/01/21 significantly elevated at 1,989.58. -he met Dr. Kieth Brightly on 11/05/21. -EUS on 11/06/21 by Dr. Paulita Fujita, staged as T3 N3 (at least stage III) -staging CT CAP 11/08/21 showed: stable gastric antrum wall thickening; increased size of upper abdominal lymph nodes; stable left adrenal nodule, 1.9 cm. -he started neoadjuvant FLOT on 11/17.  -PET scan showed FDG avid lymph nodes within the gastrohepatic ligament, portacaval region, and mesentery. Tracer avid left supraclavicular lymph node and indeterminate left adrenal gland nodule with mild FDG uptake. -he underwent [place] node biopsy on 1/17 which was negative for malignant cells, but no lymphoid tissue seen on biopsy  -he is tolerating chemo FLOT well, will continue -repeated PET on 02/07/22 showed resolved left supraclavicular lymph node, and significant improvement in regional lymph nodes and the primary tumor, stable and indeterminate left adrenal nodule.  -CT adrenal protocol showed the left adrenal nodule is likely benign adenoma, no biopsy is needed. -I will refer him back to surgeon to consider surgery after neoadjuvant chemotherapy.  Plan to give a total of up to 12 cycles of FLOT, depends on his tolerance

## 2022-03-14 ENCOUNTER — Inpatient Hospital Stay: Payer: BC Managed Care – PPO

## 2022-03-14 ENCOUNTER — Encounter: Payer: Self-pay | Admitting: Hematology

## 2022-03-14 ENCOUNTER — Telehealth: Payer: Self-pay

## 2022-03-14 ENCOUNTER — Inpatient Hospital Stay: Payer: BC Managed Care – PPO | Attending: Physician Assistant | Admitting: Hematology

## 2022-03-14 VITALS — BP 115/75 | HR 88 | Temp 97.6°F | Resp 18 | Ht 65.0 in | Wt 187.0 lb

## 2022-03-14 DIAGNOSIS — R970 Elevated carcinoembryonic antigen [CEA]: Secondary | ICD-10-CM | POA: Diagnosis not present

## 2022-03-14 DIAGNOSIS — Z8619 Personal history of other infectious and parasitic diseases: Secondary | ICD-10-CM | POA: Diagnosis not present

## 2022-03-14 DIAGNOSIS — Z8711 Personal history of peptic ulcer disease: Secondary | ICD-10-CM | POA: Diagnosis not present

## 2022-03-14 DIAGNOSIS — Z5189 Encounter for other specified aftercare: Secondary | ICD-10-CM | POA: Diagnosis not present

## 2022-03-14 DIAGNOSIS — E278 Other specified disorders of adrenal gland: Secondary | ICD-10-CM | POA: Diagnosis not present

## 2022-03-14 DIAGNOSIS — C163 Malignant neoplasm of pyloric antrum: Secondary | ICD-10-CM

## 2022-03-14 DIAGNOSIS — Z5111 Encounter for antineoplastic chemotherapy: Secondary | ICD-10-CM | POA: Insufficient documentation

## 2022-03-14 DIAGNOSIS — F17210 Nicotine dependence, cigarettes, uncomplicated: Secondary | ICD-10-CM | POA: Insufficient documentation

## 2022-03-14 DIAGNOSIS — Z95828 Presence of other vascular implants and grafts: Secondary | ICD-10-CM

## 2022-03-14 LAB — CMP (CANCER CENTER ONLY)
ALT: 15 U/L (ref 0–44)
AST: 14 U/L — ABNORMAL LOW (ref 15–41)
Albumin: 3.5 g/dL (ref 3.5–5.0)
Alkaline Phosphatase: 89 U/L (ref 38–126)
Anion gap: 5 (ref 5–15)
BUN: 13 mg/dL (ref 6–20)
CO2: 29 mmol/L (ref 22–32)
Calcium: 9 mg/dL (ref 8.9–10.3)
Chloride: 105 mmol/L (ref 98–111)
Creatinine: 0.76 mg/dL (ref 0.61–1.24)
GFR, Estimated: >60 mL/min (ref >60)
Glucose, Bld: 116 mg/dL — ABNORMAL HIGH (ref 70–99)
Potassium: 3.6 mmol/L (ref 3.5–5.1)
Sodium: 139 mmol/L (ref 135–145)
Total Bilirubin: 0.3 mg/dL (ref 0.3–1.2)
Total Protein: 6.1 g/dL — ABNORMAL LOW (ref 6.5–8.1)

## 2022-03-14 LAB — CBC WITH DIFFERENTIAL (CANCER CENTER ONLY)
Abs Immature Granulocytes: 3.13 10*3/uL — ABNORMAL HIGH (ref 0.00–0.07)
Basophils Absolute: 0 10*3/uL (ref 0.0–0.1)
Basophils Relative: 0 %
Eosinophils Absolute: 0 10*3/uL (ref 0.0–0.5)
Eosinophils Relative: 0 %
HCT: 33 % — ABNORMAL LOW (ref 39.0–52.0)
Hemoglobin: 10.7 g/dL — ABNORMAL LOW (ref 13.0–17.0)
Immature Granulocytes: 12 %
Lymphocytes Relative: 19 %
Lymphs Abs: 5.1 10*3/uL — ABNORMAL HIGH (ref 0.7–4.0)
MCH: 28.8 pg (ref 26.0–34.0)
MCHC: 32.4 g/dL (ref 30.0–36.0)
MCV: 88.7 fL (ref 80.0–100.0)
Monocytes Absolute: 1.5 10*3/uL — ABNORMAL HIGH (ref 0.1–1.0)
Monocytes Relative: 6 %
Neutro Abs: 17.1 10*3/uL — ABNORMAL HIGH (ref 1.7–7.7)
Neutrophils Relative %: 63 %
Platelet Count: 163 10*3/uL (ref 150–400)
RBC: 3.72 MIL/uL — ABNORMAL LOW (ref 4.22–5.81)
RDW: 20.1 % — ABNORMAL HIGH (ref 11.5–15.5)
Smear Review: NORMAL
WBC Count: 26.8 10*3/uL — ABNORMAL HIGH (ref 4.0–10.5)
nRBC: 0.9 % — ABNORMAL HIGH (ref 0.0–0.2)

## 2022-03-14 MED ORDER — SODIUM CHLORIDE 0.9% FLUSH
10.0000 mL | Freq: Once | INTRAVENOUS | Status: AC
  Administered 2022-03-14: 10 mL

## 2022-03-14 MED ORDER — OXALIPLATIN CHEMO INJECTION 100 MG/20ML
85.0000 mg/m2 | Freq: Once | INTRAVENOUS | Status: AC
  Administered 2022-03-14: 165 mg via INTRAVENOUS
  Filled 2022-03-14: qty 33

## 2022-03-14 MED ORDER — SODIUM CHLORIDE 0.9 % IV SOLN
10.0000 mg | Freq: Once | INTRAVENOUS | Status: AC
  Administered 2022-03-14: 10 mg via INTRAVENOUS
  Filled 2022-03-14: qty 10

## 2022-03-14 MED ORDER — SODIUM CHLORIDE 0.9 % IV SOLN
50.0000 mg/m2 | Freq: Once | INTRAVENOUS | Status: AC
  Administered 2022-03-14: 98 mg via INTRAVENOUS
  Filled 2022-03-14: qty 9.8

## 2022-03-14 MED ORDER — PALONOSETRON HCL INJECTION 0.25 MG/5ML
0.2500 mg | Freq: Once | INTRAVENOUS | Status: AC
  Administered 2022-03-14: 0.25 mg via INTRAVENOUS
  Filled 2022-03-14: qty 5

## 2022-03-14 MED ORDER — DEXTROSE 5 % IV SOLN: Freq: Once | INTRAVENOUS | Status: AC

## 2022-03-14 MED ORDER — SODIUM CHLORIDE 0.9 % IV SOLN
2400.0000 mg/m2 | INTRAVENOUS | Status: DC
  Administered 2022-03-14: 5000 mg via INTRAVENOUS
  Filled 2022-03-14: qty 100

## 2022-03-14 MED ORDER — LEUCOVORIN CALCIUM INJECTION 350 MG
200.0000 mg/m2 | Freq: Once | INTRAVENOUS | Status: AC
  Administered 2022-03-14: 390 mg via INTRAVENOUS
  Filled 2022-03-14: qty 19.5

## 2022-03-14 NOTE — Progress Notes (Signed)
Jonathan Maynard   [phone] [phone]   Clinic Follow up Note   Patient Care Team: Pcp, No as PCP - General Truitt Merle, MD as Consulting Physician (Hematology and Oncology)  Date of Service:  03/14/2022  CHIEF COMPLAINT: f/u of gastric cancer   CURRENT THERAPY:  Neoadjuvant FLOT, q14d, starting 11/22/21     ASSESSMENT:  Jonathan Maynard is a 52 y.o. 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 Procedure showed mucosal intramucosal adenocarcinoma. MMR normal. -baseline CEA 11/01/21 significantly elevated at 1,989.58. -he met Dr. Kieth Brightly on 11/05/21. -EUS on 11/06/21 by Dr. Paulita Fujita, staged as T3 N3 (at least stage III) -staging CT CAP 11/08/21 showed: stable gastric antrum wall thickening; increased size of upper abdominal lymph nodes; stable left adrenal nodule, 1.9 cm. -he started neoadjuvant FLOT on 11/17.  -PET scan showed FDG avid lymph nodes within the gastrohepatic ligament, portacaval region, and mesentery. Tracer avid left supraclavicular lymph node and indeterminate left adrenal gland nodule with mild FDG uptake. -he underwent [place] node biopsy on 1/17 which was negative for malignant cells, but no lymphoid tissue seen on biopsy  -he is tolerating chemo FLOT well, will continue -repeated PET on 02/07/22 showed resolved left supraclavicular lymph node, and significant improvement in regional lymph nodes and the primary tumor, stable and indeterminate left adrenal nodule.  -CT adrenal protocol showed the left adrenal nodule is likely benign adenoma, no biopsy is needed. -I will refer him back to surgeon to consider surgery after neoadjuvant chemotherapy.  Plan to give a total of up to 12 cycles of FLOT, depends on his tolerance  -He has mildly decreased vibration sensation on exam  today, will watch his neuropathy closely. -I will refer him back to Dr. Kieth Brightly to discuss surgery.   PLAN: -Reason the CT scan findings reviewed with patient -Lab reviewed, adequate for treatment, will proceed cycle 9 FLOT today -Refer him back to Dr. Kieth Brightly to discuss surgery -Patient agreed with genetic testing, referral made today   SUMMARY OF ONCOLOGIC HISTORY: Oncology History Overview Note   Cancer Staging  Gastric cancer [facility]) Staging form: Stomach, AJCC 8th Edition - Clinical stage from 10/18/2021: Stage III (cT3, cN3a, cM0) - Signed by Truitt Merle, MD on 11/10/2021     Gastric cancer ([place])  10/18/2021 Procedure   EGD performed under the care of Dr. Michail Sermon  Findings:  -A large, ulcerated, partially circumferential (involving two thirds of the lumen circumference) mass with no bleeding and stigmata of recent bleeding was found in the gastric antrum, at the pylorus and in the prepyloric region of the stomach. -Segmental severe inflammation characterized by congestion (edema) and erythema was found in the gastric antrum.   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 Negative for dysplasia.  Negative for helicobacter pylori  Mismatch Repair (MMR) Protein Imunohistochemistry (IHC): IHC Expression Result: MLH1: Preserved nuclear expression. MSH2: Preserved nuclear expression. MSH6: Preserved nuclear expression. PMS2: Preserved nuclear expression. Interpretation: NORMAL   10/31/2021 Initial Diagnosis  Gastric cancer ([place])   11/01/2021 Tumor  Marker   CEA = [id number] (^)   11/06/2021 Procedure   Upper EUS, Dr. Paulita Fujita  Impression: - Normal esophagus. - A small amount of food (residue) in the stomach. - Congested, friable (with contact bleeding) and ulcerated mucosa in the prepyloric region of the stomach. - Normal examined duodenum. - There was no evidence of significant pathology in the left lobe of the liver. - Many abnormal lymph nodes (over 10) were visualized in the gastrohepatic ligament (level 18), celiac region (level 20), perigastric region, peripancreatic region and aortocaval region. - Wall thickening was seen in the prepyloric region of the stomach. The thickening appeared to be primarily within the deep mucosa (Layer 2) but extended through the muscularis propria. - Overall constellation of findings consistent with at least T3 N3 Mx (at least stage III) gastric adenocarcinoma.   11/08/2021 Imaging   EXAM: CT CHEST, ABDOMEN, AND PELVIS WITH CONTRAST  IMPRESSION: 1. Similar irregular wall thickening of the gastric antrum, consistent with patient's known primary gastric neoplasm. 2. Increased size of the upper abdominal lymph nodes, concerning for worsening nodal disease involvement. 3. Stable left adrenal nodule common nonspecific possibly a metastatic lesion or adenoma. 4. No evidence of metastatic disease in the chest. 5. Left-sided colonic diverticulosis without findings of acute diverticulitis. 6. Enlarged prostate gland. 7.  Aortic Atherosclerosis (ICD10-I70.0).   11/22/2021 -  Chemotherapy   Patient is on Treatment Plan : GASTROESOPHAGEAL FLOT q14d X 4 cycles     11/27/2021 Imaging    IMPRESSION: 1. The mass within the gastric antrum is mildly decreased in size when compared with 11/08/2021. There is persistent FDG uptake associated with this mass compatible with residual tumor. 2. Persistent FDG avid lymph nodes within the gastrohepatic ligament, portacaval region, and mesentery. These are  mildly decreased in size when compared with 11/08/2021. 3. Tracer avid left supraclavicular lymph node is also mildly decreased in size when compared with 11/08/2021. 4. Indeterminate left adrenal gland nodule with mild FDG uptake. This may represent a lipid poor adenoma. Metastatic disease not exclude. 5. Diffuse increased uptake throughout the bone marrow is favored to represent treatment related change. 6.  Aortic Atherosclerosis (ICD10-I70.0).     02/07/2022 Imaging    IMPRESSION: 1. No substantial change in hypermetabolism associated with the distal gastric lesion. Although difficult to discern on noncontrast CT imaging, the soft tissue component appears to be decreased since the prior PET-CT. 2. Interval resolution of the hypermetabolic left supraclavicular lymph node. 3. Interval marked decrease of the hypermetabolic gastrohepatic ligament lymphadenopathy. Similar decrease in size and hypermetabolism associated with index nodes identified previously in the 4. porta hepatis and hypogastric region. 5. Stable left adrenal nodule with slight hypermetabolism. Nonspecific finding could represent lipid poor adenoma or metastatic deposit. 6. No new suspicious hypermetabolic disease on today's study. 7.  Aortic Atherosclerosis (ICD10-I70.0).      INTERVAL HISTORY:  Jonathan Maynard is here for a follow up of gastric cancer. He was last seen by me 2 weeks ago. He presents to the clinic accompanied by his wife.  He is clinically doing well, still has dark skin in his hands and feet, no significant skin peeling or crackling.  He is tolerating chemo well overall, no significant numbness, tingling in his hands or foot.  He is eating well, weight is stable.   All other systems were reviewed with the patient and are negative.  MEDICAL HISTORY:  Past Medical History:  Diagnosis Date  Acute upper GI bleed 04/12/2021   Class 1 obesity 04/12/2021   Diverticulosis 04/12/2021    Hepatic steatosis 04/12/2021    SURGICAL HISTORY: Past Surgical History:  Procedure Laterality Date   BIOPSY  04/12/2021   Procedure: BIOPSY;  Surgeon: Wilford Corner, MD;  Location: [facility];  Service: Gastroenterology;;   ESOPHAGOGASTRODUODENOSCOPY N/A 04/12/2021   Procedure: ESOPHAGOGASTRODUODENOSCOPY (EGD);  Surgeon: Wilford Corner, MD;  Location: [facility];  Service: Gastroenterology;  Laterality: N/A;   ESOPHAGOGASTRODUODENOSCOPY N/A 11/06/2021   Procedure: ESOPHAGOGASTRODUODENOSCOPY (EGD);  Surgeon: Arta Silence, MD;  Location: [facility];  Service: Gastroenterology;  Laterality: N/A;   HERNIA REPAIR     IR IMAGING GUIDED PORT INSERTION  11/15/2021   UPPER ESOPHAGEAL ENDOSCOPIC ULTRASOUND (EUS) Bilateral 11/06/2021   Procedure: UPPER ESOPHAGEAL ENDOSCOPIC ULTRASOUND (EUS);  Surgeon: Arta Silence, MD;  Location: [facility];  Service: Gastroenterology;  Laterality: Bilateral;    I have reviewed the social history and family history with the patient and they are unchanged from previous note.  ALLERGIES:  has No Known Allergies.  MEDICATIONS:  Current Outpatient Medications  Medication Sig Dispense Refill   dexamethasone (DECADRON) 4 MG tablet Take 1 tablet by mouth twice a day the day before chemo.  Then take 1 tablet by mouth once daily for 2 days after chemo. 30 tablet 1   ferrous sulfate 325 (65 FE) MG tablet Take 1 tablet (325 mg total) by mouth daily. 30 tablet 3   lidocaine-prilocaine (EMLA) cream Apply to affected area as directed once (Patient not taking: Reported on 01/03/2022) 30 g 3   magic mouthwash (lidocaine, diphenhydrAMINE, alum & mag hydroxide) suspension Swish and spit 15 mLs 3 (three) times daily. 100 mL 0   magic mouthwash (multi-ingredient) oral suspension Swish, gargle and spit 5-10 mls by mouth every 6 hours as needed 480 mL 0   magic mouthwash SOLN Take 5 mLs by mouth 4 (four) times daily as needed for mouth pain. Swish, gargle, and spit one  to two teaspoonfuls for 1 minute. Repeat every 6 hours as needed. 480 mL 0   ondansetron (ZOFRAN) 8 MG tablet Take 1 tablet (8 mg total) by mouth every 8 (eight) hours as needed for nausea or vomiting. Start on the third day after chemotherapy. 30 tablet 1   pantoprazole (PROTONIX) 40 MG tablet Take 1 tablet (40 mg total) by mouth 2 (two) times daily. 60 tablet 3   prochlorperazine (COMPAZINE) 10 MG tablet Take 1 tablet (10 mg total) by mouth every 6 (six) hours as needed for nausea or vomiting. 30 tablet 1   No current facility-administered medications for this visit.   Facility-Administered Medications Ordered in Other Visits  Medication Dose Route Frequency Provider Last Rate Last Admin   DOCEtaxel (TAXOTERE) 98 mg in sodium chloride 0.9 % 250 mL chemo infusion  50 mg/m2 (Treatment Plan Recorded) Intravenous Once Truitt Merle, MD 260 mL/hr at 03/14/22 1038 98 mg at 03/14/22 1038   fluorouracil (ADRUCIL) 5,000 mg in sodium chloride 0.9 % 150 mL chemo infusion  2,400 mg/m2 (Treatment Plan Recorded) Intravenous 1 day or 1 dose Truitt Merle, MD       leucovorin 390 mg in dextrose 5 % 250 mL infusion  200 mg/m2 (Treatment Plan Recorded) Intravenous Once Truitt Merle, MD       oxaliplatin (ELOXATIN) 165 mg in dextrose 5 % 500 mL chemo infusion  85 mg/m2 (Treatment Plan Recorded) Intravenous Once Truitt Merle, MD        PHYSICAL EXAMINATION: ECOG PERFORMANCE  STATUS: 1 - Symptomatic but completely ambulatory  Vitals:   03/14/22 0903  BP: 115/75  Pulse: 88  Resp: 18  Temp: 97.6 F (36.4 C)  SpO2: 99%   Wt Readings from Last 3 Encounters:  03/14/22 187 lb (84.8 kg)  02/28/22 183 lb 3.2 oz (83.1 kg)  02/14/22 182 lb 8 oz (82.8 kg)     GENERAL:alert, no distress and comfortable SKIN: skin color, texture, turgor are normal, no rashes or significant lesions EYES: normal, Conjunctiva are pink and non-injected, sclera clear NECK: supple, thyroid normal size, non-tender, without nodularity LYMPH:  no  palpable lymphadenopathy in the cervical, axillary  LUNGS: clear to auscultation and percussion with normal breathing effort HEART: regular rate & rhythm and no murmurs and no lower extremity edema ABDOMEN:abdomen soft, non-tender and normal bowel sounds Musculoskeletal:no cyanosis of digits and no clubbing  NEURO: alert & oriented x 3 with fluent speech, no focal motor/sensory deficits  LABORATORY DATA:  I have reviewed the data as listed    Latest Ref Rng & Units 03/14/2022    8:41 AM 02/28/2022    8:48 AM 02/14/2022    8:08 AM  CBC  WBC 4.0 - 10.5 K/uL 26.8  29.0  26.7   Hemoglobin 13.0 - 17.0 g/dL 10.7  10.5  10.8   Hematocrit 39.0 - 52.0 % 33.0  32.7  33.6   Platelets 150 - 400 K/uL 163  214  181         Latest Ref Rng & Units 03/14/2022    8:41 AM 02/28/2022    8:48 AM 02/14/2022    8:08 AM  CMP  Glucose 70 - 99 mg/dL 116  128  106   BUN 6 - 20 mg/dL '13  10  8   '$ Creatinine 0.61 - 1.24 mg/dL 0.76  0.78  0.79   Sodium 135 - 145 mmol/L 139  141  139   Potassium 3.5 - 5.1 mmol/L 3.6  3.6  3.9   Chloride 98 - 111 mmol/L 105  107  104   CO2 22 - 32 mmol/L '29  27  29   '$ Calcium 8.9 - 10.3 mg/dL 9.0  8.3  9.0   Total Protein 6.5 - 8.1 g/dL 6.1  5.8  5.9   Total Bilirubin 0.3 - 1.2 mg/dL 0.3  0.2  0.2   Alkaline Phos 38 - 126 U/L 89  100  102   AST 15 - 41 U/L '14  15  16   '$ ALT 0 - 44 U/L '15  15  17       '$ RADIOGRAPHIC STUDIES: I have personally reviewed the radiological images as listed and agreed with the findings in the report. No results found.    Orders Placed This Encounter  Procedures   Ambulatory referral to Genetics    Referral Priority:   Routine    Referral Type:   Consultation    Referral Reason:   Specialty Services Required    Number of Visits Requested:   1   All questions were answered. The patient knows to call the clinic with any problems, questions or concerns. No barriers to learning was detected. The total time spent in the appointment was 30  minutes.     Truitt Merle, MD 03/14/2022

## 2022-03-14 NOTE — Telephone Encounter (Signed)
Per 3/8 IB reached out to patient to schedule, unable to reach patient.

## 2022-03-14 NOTE — Patient Instructions (Signed)
[facility]  Discharge Instructions: Thank you for choosing [facility] to provide your oncology and hematology care.   If you have a lab appointment with the [facility], please go directly to the [facility] and check in at the registration area.   Wear comfortable clothing and clothing appropriate for easy access to any Portacath or PICC line.   We strive to give you quality time with your provider. You may need to reschedule your appointment if you arrive late (15 or more minutes).  Arriving late affects you and other patients whose appointments are after yours.  Also, if you miss three or more appointments without notifying the office, you may be dismissed from the clinic at the provider's discretion.      For prescription refill requests, have your pharmacy contact our office and allow 72 hours for refills to be completed.    Today you received the following chemotherapy and/or immunotherapy agents: docetaxel, oxaliplatin, leucovorin, fluorouracil      To help prevent nausea and vomiting after your treatment, we encourage you to take your nausea medication as directed.  BELOW ARE SYMPTOMS THAT SHOULD BE REPORTED IMMEDIATELY: *FEVER GREATER THAN 100.4 F (38 C) OR HIGHER *CHILLS OR SWEATING *NAUSEA AND VOMITING THAT IS NOT CONTROLLED WITH YOUR NAUSEA MEDICATION *UNUSUAL SHORTNESS OF BREATH *UNUSUAL BRUISING OR BLEEDING *URINARY PROBLEMS (pain or burning when urinating, or frequent urination) *BOWEL PROBLEMS (unusual diarrhea, constipation, pain near the anus) TENDERNESS IN MOUTH AND THROAT WITH OR WITHOUT PRESENCE OF ULCERS (sore throat, sores in mouth, or a toothache) UNUSUAL RASH, SWELLING OR PAIN  UNUSUAL VAGINAL DISCHARGE OR ITCHING   Items with * indicate a potential emergency and should be followed up as soon as possible or go to the Emergency Department if any problems should occur.  Please show the CHEMOTHERAPY ALERT  CARD or IMMUNOTHERAPY ALERT CARD at check-in to the Emergency Department and triage nurse.  Should you have questions after your visit or need to cancel or reschedule your appointment, please contact [facility]  Dept: [phone]  and follow the prompts.  Office hours are 8:00 a.m. to 4:30 p.m. Monday - Friday. Please note that voicemails left after 4:00 p.m. may not be returned until the following business day.  We are closed weekends and major holidays. You have access to a nurse at all times for urgent questions. Please call the main number to the clinic Dept: [phone] and follow the prompts.   For any non-urgent questions, you may also contact your provider using MyChart. We now offer e-Visits for anyone 59 and older to request care online for non-urgent symptoms. For details visit [email or web address].   Also download the MyChart app! Go to the app store, search "MyChart", open the app, select [facility], and log in with your MyChart username and password.

## 2022-03-15 ENCOUNTER — Inpatient Hospital Stay: Payer: BC Managed Care – PPO

## 2022-03-15 VITALS — BP 117/73 | HR 89 | Temp 99.0°F | Resp 18

## 2022-03-15 DIAGNOSIS — Z5111 Encounter for antineoplastic chemotherapy: Secondary | ICD-10-CM | POA: Diagnosis not present

## 2022-03-15 DIAGNOSIS — C163 Malignant neoplasm of pyloric antrum: Secondary | ICD-10-CM

## 2022-03-15 MED ORDER — HEPARIN SOD (PORK) LOCK FLUSH 100 UNIT/ML IV SOLN
500.0000 [IU] | Freq: Once | INTRAVENOUS | Status: AC | PRN
  Administered 2022-03-15: 500 [IU]

## 2022-03-15 MED ORDER — PEGFILGRASTIM-JMDB 6 MG/0.6ML ~~LOC~~ SOSY
6.0000 mg | PREFILLED_SYRINGE | Freq: Once | SUBCUTANEOUS | Status: AC
  Administered 2022-03-15: 6 mg via SUBCUTANEOUS

## 2022-03-15 MED ORDER — SODIUM CHLORIDE 0.9% FLUSH
10.0000 mL | INTRAVENOUS | Status: DC | PRN
  Administered 2022-03-15: 10 mL

## 2022-03-21 ENCOUNTER — Other Ambulatory Visit (HOSPITAL_COMMUNITY): Payer: Self-pay

## 2022-03-21 ENCOUNTER — Other Ambulatory Visit: Payer: Self-pay

## 2022-03-21 ENCOUNTER — Other Ambulatory Visit: Payer: Self-pay | Admitting: Hematology

## 2022-03-21 DIAGNOSIS — C163 Malignant neoplasm of pyloric antrum: Secondary | ICD-10-CM

## 2022-03-21 MED ORDER — PROCHLORPERAZINE MALEATE 10 MG PO TABS
10.0000 mg | ORAL_TABLET | Freq: Four times a day (QID) | ORAL | 1 refills | Status: DC | PRN
  Filled 2022-03-21: qty 30, 8d supply, fill #0
  Filled 2022-04-26: qty 30, 8d supply, fill #1

## 2022-03-21 MED ORDER — DEXAMETHASONE 4 MG PO TABS
4.0000 mg | ORAL_TABLET | Freq: Every day | ORAL | 1 refills | Status: DC
  Filled 2022-03-21: qty 30, 30d supply, fill #0
  Filled 2022-04-01: qty 30, 25d supply, fill #0
  Filled 2022-04-26: qty 30, 25d supply, fill #1

## 2022-03-24 ENCOUNTER — Telehealth: Payer: Self-pay | Admitting: Physician Assistant

## 2022-03-24 NOTE — Telephone Encounter (Signed)
Per 3/18 IB reached out to patient to answer questions regarding appointments.

## 2022-03-27 MED FILL — Dexamethasone Sodium Phosphate Inj 100 MG/10ML: INTRAMUSCULAR | Qty: 1 | Status: AC

## 2022-03-27 NOTE — Assessment & Plan Note (Signed)
stage III, cT3N3Mx, with hypermetabolic left supraclavicular node, and indeterminate adrenal nodule, MMR normal -diagnosed 10/18/21 by EGD for 6 month f/u of gastric ulcers. Procedure showed mucosal intramucosal adenocarcinoma. MMR normal. -baseline CEA 11/01/21 significantly elevated at 1,989.58. -he met Dr. Kieth Brightly on 11/05/21. -EUS on 11/06/21 by Dr. Paulita Fujita, staged as T3 N3 (at least stage III) -staging CT CAP 11/08/21 showed: stable gastric antrum wall thickening; increased size of upper abdominal lymph nodes; stable left adrenal nodule, 1.9 cm. -he started neoadjuvant FLOT on 11/17.  -PET scan showed FDG avid lymph nodes within the gastrohepatic ligament, portacaval region, and mesentery. Tracer avid left supraclavicular lymph node and indeterminate left adrenal gland nodule with mild FDG uptake. -he underwent [facility] node biopsy on 1/17 which was negative for malignant cells, but no lymphoid tissue seen on biopsy  -he is tolerating chemo FLOT well, will continue -repeated PET on 02/07/22 showed resolved left supraclavicular lymph node, and significant improvement in regional lymph nodes and the primary tumor, stable and indeterminate left adrenal nodule.  -CT adrenal protocol showed the left adrenal nodule is likely benign adenoma, no biopsy is needed. -he saw Dr. Kieth Brightly today, plan to have surgery 3-4 weeks after last cycle chemo on 4/19

## 2022-03-28 ENCOUNTER — Inpatient Hospital Stay: Payer: BC Managed Care – PPO

## 2022-03-28 ENCOUNTER — Encounter: Payer: Self-pay | Admitting: Hematology

## 2022-03-28 ENCOUNTER — Inpatient Hospital Stay (HOSPITAL_BASED_OUTPATIENT_CLINIC_OR_DEPARTMENT_OTHER): Payer: BC Managed Care – PPO | Admitting: Hematology

## 2022-03-28 VITALS — BP 117/67 | HR 87 | Temp 98.0°F | Ht 65.0 in | Wt 187.9 lb

## 2022-03-28 DIAGNOSIS — Z5111 Encounter for antineoplastic chemotherapy: Secondary | ICD-10-CM | POA: Diagnosis not present

## 2022-03-28 DIAGNOSIS — C163 Malignant neoplasm of pyloric antrum: Secondary | ICD-10-CM

## 2022-03-28 DIAGNOSIS — Z95828 Presence of other vascular implants and grafts: Secondary | ICD-10-CM

## 2022-03-28 LAB — CMP (CANCER CENTER ONLY)
ALT: 18 U/L (ref 0–44)
AST: 18 U/L (ref 15–41)
Albumin: 3.5 g/dL (ref 3.5–5.0)
Alkaline Phosphatase: 100 U/L (ref 38–126)
Anion gap: 7 (ref 5–15)
BUN: 10 mg/dL (ref 6–20)
CO2: 28 mmol/L (ref 22–32)
Calcium: 8.9 mg/dL (ref 8.9–10.3)
Chloride: 104 mmol/L (ref 98–111)
Creatinine: 0.81 mg/dL (ref 0.61–1.24)
GFR, Estimated: >60 mL/min (ref >60)
Glucose, Bld: 116 mg/dL — ABNORMAL HIGH (ref 70–99)
Potassium: 3.6 mmol/L (ref 3.5–5.1)
Sodium: 139 mmol/L (ref 135–145)
Total Bilirubin: 0.2 mg/dL — ABNORMAL LOW (ref 0.3–1.2)
Total Protein: 5.9 g/dL — ABNORMAL LOW (ref 6.5–8.1)

## 2022-03-28 LAB — CBC WITH DIFFERENTIAL (CANCER CENTER ONLY)
Abs Immature Granulocytes: 3.44 10*3/uL — ABNORMAL HIGH (ref 0.00–0.07)
Basophils Absolute: 0 10*3/uL (ref 0.0–0.1)
Basophils Relative: 0 %
Eosinophils Absolute: 0 10*3/uL (ref 0.0–0.5)
Eosinophils Relative: 0 %
HCT: 33.6 % — ABNORMAL LOW (ref 39.0–52.0)
Hemoglobin: 10.6 g/dL — ABNORMAL LOW (ref 13.0–17.0)
Immature Granulocytes: 13 %
Lymphocytes Relative: 17 %
Lymphs Abs: 4.7 10*3/uL — ABNORMAL HIGH (ref 0.7–4.0)
MCH: 28.2 pg (ref 26.0–34.0)
MCHC: 31.5 g/dL (ref 30.0–36.0)
MCV: 89.4 fL (ref 80.0–100.0)
Monocytes Absolute: 1.9 10*3/uL — ABNORMAL HIGH (ref 0.1–1.0)
Monocytes Relative: 7 %
Neutro Abs: 17 10*3/uL — ABNORMAL HIGH (ref 1.7–7.7)
Neutrophils Relative %: 63 %
Platelet Count: 174 10*3/uL (ref 150–400)
RBC: 3.76 MIL/uL — ABNORMAL LOW (ref 4.22–5.81)
RDW: 19.9 % — ABNORMAL HIGH (ref 11.5–15.5)
Smear Review: NORMAL
WBC Count: 27.1 10*3/uL — ABNORMAL HIGH (ref 4.0–10.5)
nRBC: 1.5 % — ABNORMAL HIGH (ref 0.0–0.2)

## 2022-03-28 LAB — CEA (IN HOUSE-CHCC): CEA (CHCC-In House): 31.4 ng/mL — ABNORMAL HIGH (ref 0.00–5.00)

## 2022-03-28 MED ORDER — SODIUM CHLORIDE 0.9 % IV SOLN
50.0000 mg/m2 | Freq: Once | INTRAVENOUS | Status: AC
  Administered 2022-03-28: 98 mg via INTRAVENOUS
  Filled 2022-03-28: qty 9.8

## 2022-03-28 MED ORDER — SODIUM CHLORIDE 0.9% FLUSH: 10.0000 mL | INTRAVENOUS | Status: DC | PRN

## 2022-03-28 MED ORDER — SODIUM CHLORIDE 0.9 % IV SOLN
2400.0000 mg/m2 | INTRAVENOUS | Status: DC
  Administered 2022-03-28: 5000 mg via INTRAVENOUS
  Filled 2022-03-28: qty 100

## 2022-03-28 MED ORDER — LEUCOVORIN CALCIUM INJECTION 350 MG
200.0000 mg/m2 | Freq: Once | INTRAVENOUS | Status: AC
  Administered 2022-03-28: 390 mg via INTRAVENOUS
  Filled 2022-03-28: qty 19.5

## 2022-03-28 MED ORDER — DEXTROSE 5 % IV SOLN: Freq: Once | INTRAVENOUS | Status: AC

## 2022-03-28 MED ORDER — HEPARIN SOD (PORK) LOCK FLUSH 100 UNIT/ML IV SOLN: 500.0000 [IU] | Freq: Once | INTRAVENOUS | Status: DC | PRN

## 2022-03-28 MED ORDER — SODIUM CHLORIDE 0.9% FLUSH
10.0000 mL | Freq: Once | INTRAVENOUS | Status: AC
  Administered 2022-03-28: 10 mL

## 2022-03-28 MED ORDER — PALONOSETRON HCL INJECTION 0.25 MG/5ML
0.2500 mg | Freq: Once | INTRAVENOUS | Status: AC
  Administered 2022-03-28: 0.25 mg via INTRAVENOUS
  Filled 2022-03-28: qty 5

## 2022-03-28 MED ORDER — SODIUM CHLORIDE 0.9 % IV SOLN
10.0000 mg | Freq: Once | INTRAVENOUS | Status: AC
  Administered 2022-03-28: 10 mg via INTRAVENOUS
  Filled 2022-03-28: qty 10

## 2022-03-28 MED ORDER — OXALIPLATIN CHEMO INJECTION 100 MG/20ML
85.0000 mg/m2 | Freq: Once | INTRAVENOUS | Status: AC
  Administered 2022-03-28: 165 mg via INTRAVENOUS
  Filled 2022-03-28: qty 20

## 2022-03-28 NOTE — Patient Instructions (Signed)
[facility]  Discharge Instructions: Thank you for choosing [facility] to provide your oncology and hematology care.   If you have a lab appointment with the [facility], please go directly to the [facility] and check in at the registration area.   Wear comfortable clothing and clothing appropriate for easy access to any Portacath or PICC line.   We strive to give you quality time with your provider. You may need to reschedule your appointment if you arrive late (15 or more minutes).  Arriving late affects you and other patients whose appointments are after yours.  Also, if you miss three or more appointments without notifying the office, you may be dismissed from the clinic at the provider's discretion.      For prescription refill requests, have your pharmacy contact our office and allow 72 hours for refills to be completed.    Today you received the following chemotherapy and/or immunotherapy agents: Docetaxel, Oxaliplatin, Leucovorin, Fluorouracil.       To help prevent nausea and vomiting after your treatment, we encourage you to take your nausea medication as directed.  BELOW ARE SYMPTOMS THAT SHOULD BE REPORTED IMMEDIATELY: *FEVER GREATER THAN 100.4 F (38 C) OR HIGHER *CHILLS OR SWEATING *NAUSEA AND VOMITING THAT IS NOT CONTROLLED WITH YOUR NAUSEA MEDICATION *UNUSUAL SHORTNESS OF BREATH *UNUSUAL BRUISING OR BLEEDING *URINARY PROBLEMS (pain or burning when urinating, or frequent urination) *BOWEL PROBLEMS (unusual diarrhea, constipation, pain near the anus) TENDERNESS IN MOUTH AND THROAT WITH OR WITHOUT PRESENCE OF ULCERS (sore throat, sores in mouth, or a toothache) UNUSUAL RASH, SWELLING OR PAIN  UNUSUAL VAGINAL DISCHARGE OR ITCHING   Items with * indicate a potential emergency and should be followed up as soon as possible or go to the Emergency Department if any problems should occur.  Please show the CHEMOTHERAPY ALERT  CARD or IMMUNOTHERAPY ALERT CARD at check-in to the Emergency Department and triage nurse.  Should you have questions after your visit or need to cancel or reschedule your appointment, please contact [facility]  Dept: [phone]  and follow the prompts.  Office hours are 8:00 a.m. to 4:30 p.m. Monday - Friday. Please note that voicemails left after 4:00 p.m. may not be returned until the following business day.  We are closed weekends and major holidays. You have access to a nurse at all times for urgent questions. Please call the main number to the clinic Dept: [phone] and follow the prompts.   For any non-urgent questions, you may also contact your provider using MyChart. We now offer e-Visits for anyone 20 and older to request care online for non-urgent symptoms. For details visit [email or web address].   Also download the MyChart app! Go to the app store, search "MyChart", open the app, select [facility], and log in with your MyChart username and password.

## 2022-03-28 NOTE — Progress Notes (Signed)
[place]   Telephone:([phone]) [phone] Fax:(336) [phone]   Clinic Follow up Note   Patient Care Team: Pcp, No as PCP - General Truitt Merle, MD as Consulting Physician (Hematology and Oncology)  Date of Service:  03/28/2022  CHIEF COMPLAINT: f/u of gastric cancer   CURRENT THERAPY:  Neoadjuvant FLOT, q14d, starting 11/22/21    ASSESSMENT:  Jonathan Maynard is a 52 y.o. 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 Procedure showed mucosal intramucosal adenocarcinoma. MMR normal. -baseline CEA 11/01/21 significantly elevated at 1,989.58. -he met Dr. Kieth Brightly on 11/05/21. -EUS on 11/06/21 by Dr. Paulita Fujita, staged as T3 N3 (at least stage III) -staging CT CAP 11/08/21 showed: stable gastric antrum wall thickening; increased size of upper abdominal lymph nodes; stable left adrenal nodule, 1.9 cm. -he started neoadjuvant FLOT on 11/17.  -PET scan showed FDG avid lymph nodes within the gastrohepatic ligament, portacaval region, and mesentery. Tracer avid left supraclavicular lymph node and indeterminate left adrenal gland nodule with mild FDG uptake. -he underwent [place] node biopsy on 1/17 which was negative for malignant cells, but no lymphoid tissue seen on biopsy  -he is tolerating chemo FLOT well, will continue -repeated PET on 02/07/22 showed resolved left supraclavicular lymph node, and significant improvement in regional lymph nodes and the primary tumor, stable and indeterminate left adrenal nodule.  -CT adrenal protocol showed the left adrenal nodule is likely benign adenoma, no biopsy is needed. -he saw Dr. Kieth Brightly today, plan to have surgery 3-4 weeks after last cycle chemo on 4/19      PLAN: -lab reviewed -Proceed with surgery in 3-4 wks after last cycle chemo on 4/19 -discuss Tumor Marker -proceed  with C10 FLOT today  -lab/flush, f/u 04/11/2022 for C11   SUMMARY OF ONCOLOGIC HISTORY: Oncology History Overview Note   Cancer Staging  Gastric cancer [facility]) Staging form: Stomach, AJCC 8th Edition - Clinical stage from 10/18/2021: Stage III (cT3, cN3a, cM0) - Signed by Truitt Merle, MD on 11/10/2021     Gastric cancer ([place])  10/18/2021 Procedure   EGD performed under the care of Dr. Michail Sermon  Findings:  -A large, ulcerated, partially circumferential (involving two thirds of the lumen circumference) mass with no bleeding and stigmata of recent bleeding was found in the gastric antrum, at the pylorus and in the prepyloric region of the stomach. -Segmental severe inflammation characterized by congestion (edema) and erythema was found in the gastric antrum.   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 Negative for dysplasia.  Negative for helicobacter pylori  Mismatch Repair (MMR) Protein Imunohistochemistry (IHC): IHC Expression Result: MLH1: Preserved nuclear expression. MSH2: Preserved nuclear expression. MSH6: Preserved nuclear expression. PMS2: Preserved nuclear expression. Interpretation: NORMAL   10/31/2021 Initial Diagnosis   Gastric cancer ([place])   11/01/2021 Tumor Marker   CEA = [id number] (^)   11/06/2021 Procedure   Upper EUS, Dr. Paulita Fujita  Impression: - Normal esophagus. - A small amount of food (residue) in the stomach. - Congested, friable (with contact bleeding)  and ulcerated mucosa in the prepyloric region of the stomach. - Normal examined  duodenum. - There was no evidence of significant pathology in the left lobe of the liver. - Many abnormal lymph nodes (over 10) were visualized in the gastrohepatic ligament (level 18), celiac region (level 20), perigastric region, peripancreatic region and aortocaval region. - Wall thickening was seen in the prepyloric region of the stomach. The thickening appeared to be primarily within the deep mucosa (Layer 2) but extended through the muscularis propria. - Overall constellation of findings consistent with at least T3 N3 Mx (at least stage III) gastric adenocarcinoma.   11/08/2021 Imaging   EXAM: CT CHEST, ABDOMEN, AND PELVIS WITH CONTRAST  IMPRESSION: 1. Similar irregular wall thickening of the gastric antrum, consistent with patient's known primary gastric neoplasm. 2. Increased size of the upper abdominal lymph nodes, concerning for worsening nodal disease involvement. 3. Stable left adrenal nodule common nonspecific possibly a metastatic lesion or adenoma. 4. No evidence of metastatic disease in the chest. 5. Left-sided colonic diverticulosis without findings of acute diverticulitis. 6. Enlarged prostate gland. 7.  Aortic Atherosclerosis (ICD10-I70.0).   11/22/2021 -  Chemotherapy   Patient is on Treatment Plan : GASTROESOPHAGEAL FLOT q14d X 4 cycles     11/27/2021 Imaging    IMPRESSION: 1. The mass within the gastric antrum is mildly decreased in size when compared with 11/08/2021. There is persistent FDG uptake associated with this mass compatible with residual tumor. 2. Persistent FDG avid lymph nodes within the gastrohepatic ligament, portacaval region, and mesentery. These are mildly decreased in size when compared with 11/08/2021. 3. Tracer avid left supraclavicular lymph node is also mildly decreased in size when compared with 11/08/2021. 4. Indeterminate left adrenal gland nodule with mild FDG uptake. This may represent a lipid poor adenoma. Metastatic disease  not exclude. 5. Diffuse increased uptake throughout the bone marrow is favored to represent treatment related change. 6.  Aortic Atherosclerosis (ICD10-I70.0).     02/07/2022 Imaging    IMPRESSION: 1. No substantial change in hypermetabolism associated with the distal gastric lesion. Although difficult to discern on noncontrast CT imaging, the soft tissue component appears to be decreased since the prior PET-CT. 2. Interval resolution of the hypermetabolic left supraclavicular lymph node. 3. Interval marked decrease of the hypermetabolic gastrohepatic ligament lymphadenopathy. Similar decrease in size and hypermetabolism associated with index nodes identified previously in the 4. porta hepatis and hypogastric region. 5. Stable left adrenal nodule with slight hypermetabolism. Nonspecific finding could represent lipid poor adenoma or metastatic deposit. 6. No new suspicious hypermetabolic disease on today's study. 7.  Aortic Atherosclerosis (ICD10-I70.0).      INTERVAL HISTORY:  Jonathan Maynard is here for a follow up of gastric cancer  He was last seen by me on 03/14/2022. He presents to the clinic accompanied by wife. 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     All other systems were reviewed with the patient and are negative.  MEDICAL HISTORY:  Past Medical History:  Diagnosis Date   Acute upper GI bleed 04/12/2021   Class 1 obesity 04/12/2021   Diverticulosis 04/12/2021   Hepatic steatosis 04/12/2021    SURGICAL HISTORY: Past Surgical History:  Procedure Laterality Date   BIOPSY  04/12/2021   Procedure: BIOPSY;  Surgeon: Wilford Corner, MD;  Location: [facility];  Service: Gastroenterology;;   ESOPHAGOGASTRODUODENOSCOPY N/A 04/12/2021   Procedure: ESOPHAGOGASTRODUODENOSCOPY (EGD);  Surgeon: Michail Sermon,  Evette Doffing, MD;  Location: [facility];  Service:  Gastroenterology;  Laterality: N/A;   ESOPHAGOGASTRODUODENOSCOPY N/A 11/06/2021   Procedure: ESOPHAGOGASTRODUODENOSCOPY (EGD);  Surgeon: Arta Silence, MD;  Location: [facility];  Service: Gastroenterology;  Laterality: N/A;   HERNIA REPAIR     IR IMAGING GUIDED PORT INSERTION  11/15/2021   UPPER ESOPHAGEAL ENDOSCOPIC ULTRASOUND (EUS) Bilateral 11/06/2021   Procedure: UPPER ESOPHAGEAL ENDOSCOPIC ULTRASOUND (EUS);  Surgeon: Arta Silence, MD;  Location: [facility];  Service: Gastroenterology;  Laterality: Bilateral;    I have reviewed the social history and family history with the patient and they are unchanged from previous note.  ALLERGIES:  has No Known Allergies.  MEDICATIONS:  Current Outpatient Medications  Medication Sig Dispense Refill   dexamethasone (DECADRON) 4 MG tablet Take 1 tablet by mouth twice a day the day before chemo.  Then take 1 tablet by mouth once daily for 2 days after chemo. 30 tablet 1   ferrous sulfate 325 (65 FE) MG tablet Take 1 tablet (325 mg total) by mouth daily. 30 tablet 3   lidocaine-prilocaine (EMLA) cream Apply to affected area as directed once (Patient not taking: Reported on 01/03/2022) 30 g 3   magic mouthwash (lidocaine, diphenhydrAMINE, alum & mag hydroxide) suspension Swish and spit 15 mLs 3 (three) times daily. 100 mL 0   magic mouthwash (multi-ingredient) oral suspension Swish, gargle and spit 5-10 mls by mouth every 6 hours as needed 480 mL 0   magic mouthwash SOLN Take 5 mLs by mouth 4 (four) times daily as needed for mouth pain. Swish, gargle, and spit one to two teaspoonfuls for 1 minute. Repeat every 6 hours as needed. 480 mL 0   ondansetron (ZOFRAN) 8 MG tablet Take 1 tablet (8 mg total) by mouth every 8 (eight) hours as needed for nausea or vomiting. Start on the third day after chemotherapy. 30 tablet 1   pantoprazole (PROTONIX) 40 MG tablet Take 1 tablet (40 mg total) by mouth 2 (two) times daily. 60 tablet 3   prochlorperazine  (COMPAZINE) 10 MG tablet Take 1 tablet (10 mg total) by mouth every 6 (six) hours as needed for nausea or vomiting. 30 tablet 1   No current facility-administered medications for this visit.   Facility-Administered Medications Ordered in Other Visits  Medication Dose Route Frequency Provider Last Rate Last Admin   DOCEtaxel (TAXOTERE) 98 mg in sodium chloride 0.9 % 250 mL chemo infusion  50 mg/m2 (Treatment Plan Recorded) Intravenous Once Truitt Merle, MD       fluorouracil (ADRUCIL) 5,000 mg in sodium chloride 0.9 % 150 mL chemo infusion  2,400 mg/m2 (Treatment Plan Recorded) Intravenous 1 day or 1 dose Truitt Merle, MD       heparin lock flush 100 unit/mL  500 Units Intracatheter Once PRN Truitt Merle, MD       leucovorin 390 mg in dextrose 5 % 250 mL infusion  200 mg/m2 (Treatment Plan Recorded) Intravenous Once Truitt Merle, MD       oxaliplatin (ELOXATIN) 165 mg in dextrose 5 % 500 mL chemo infusion  85 mg/m2 (Treatment Plan Recorded) Intravenous Once Truitt Merle, MD       sodium chloride flush (NS) 0.9 % injection 10 mL  10 mL Intracatheter PRN Truitt Merle, MD        PHYSICAL EXAMINATION: ECOG PERFORMANCE STATUS: 1 - Symptomatic but completely ambulatory  Vitals:   03/28/22 0835  BP: 117/67  Pulse: 87  Temp: 98 F (36.7 C)  SpO2: 99%  Wt Readings from Last 3 Encounters:  03/28/22 187 lb 14.4 oz (85.2 kg)  03/14/22 187 lb (84.8 kg)  02/28/22 183 lb 3.2 oz (83.1 kg)     GENERAL:alert, no distress and comfortable SKIN: skin color normal, no rashes or significant lesions EYES: normal, Conjunctiva are pink and non-injected, sclera clear  NEURO: alert & oriented x 3 with fluent speech   LABORATORY DATA:  I have reviewed the data as listed    Latest Ref Rng & Units 03/28/2022    8:17 AM 03/14/2022    8:41 AM 02/28/2022    8:48 AM  CBC  WBC 4.0 - 10.5 K/uL 27.1  26.8  29.0   Hemoglobin 13.0 - 17.0 g/dL 10.6  10.7  10.5   Hematocrit 39.0 - 52.0 % 33.6  33.0  32.7   Platelets 150 - 400 K/uL  174  163  214         Latest Ref Rng & Units 03/28/2022    8:17 AM 03/14/2022    8:41 AM 02/28/2022    8:48 AM  CMP  Glucose 70 - 99 mg/dL 116  116  128   BUN 6 - 20 mg/dL 10  13  10    Creatinine 0.61 - 1.24 mg/dL 0.81  0.76  0.78   Sodium 135 - 145 mmol/L 139  139  141   Potassium 3.5 - 5.1 mmol/L 3.6  3.6  3.6   Chloride 98 - 111 mmol/L 104  105  107   CO2 22 - 32 mmol/L 28  29  27    Calcium 8.9 - 10.3 mg/dL 8.9  9.0  8.3   Total Protein 6.5 - 8.1 g/dL 5.9  6.1  5.8   Total Bilirubin 0.3 - 1.2 mg/dL 0.2  0.3  0.2   Alkaline Phos 38 - 126 U/L 100  89  100   AST 15 - 41 U/L 18  14  15    ALT 0 - 44 U/L 18  15  15        RADIOGRAPHIC STUDIES: I have personally reviewed the radiological images as listed and agreed with the findings in the report. No results found.    No orders of the defined types were placed in this encounter.  All questions were answered. The patient knows to call the clinic with any problems, questions or concerns. No barriers to learning was detected. The total time spent in the appointment was 30 minutes.     Truitt Merle, MD 03/28/2022   Felicity Coyer, CMA, am acting as scribe for Truitt Merle, MD.   I have reviewed the above documentation for accuracy and completeness, and I agree with the above.

## 2022-03-29 ENCOUNTER — Inpatient Hospital Stay: Payer: BC Managed Care – PPO

## 2022-03-29 VITALS — BP 129/60 | HR 94 | Temp 99.0°F | Resp 18

## 2022-03-29 DIAGNOSIS — C163 Malignant neoplasm of pyloric antrum: Secondary | ICD-10-CM

## 2022-03-29 DIAGNOSIS — Z5111 Encounter for antineoplastic chemotherapy: Secondary | ICD-10-CM | POA: Diagnosis not present

## 2022-03-29 MED ORDER — HEPARIN SOD (PORK) LOCK FLUSH 100 UNIT/ML IV SOLN
500.0000 [IU] | Freq: Once | INTRAVENOUS | Status: AC | PRN
  Administered 2022-03-29: 500 [IU]

## 2022-03-29 MED ORDER — SODIUM CHLORIDE 0.9% FLUSH
10.0000 mL | INTRAVENOUS | Status: DC | PRN
  Administered 2022-03-29: 10 mL

## 2022-03-29 MED ORDER — PEGFILGRASTIM-JMDB 6 MG/0.6ML ~~LOC~~ SOSY
6.0000 mg | PREFILLED_SYRINGE | Freq: Once | SUBCUTANEOUS | Status: AC
  Administered 2022-03-29: 6 mg via SUBCUTANEOUS

## 2022-04-01 ENCOUNTER — Other Ambulatory Visit (HOSPITAL_COMMUNITY): Payer: Self-pay

## 2022-04-08 ENCOUNTER — Other Ambulatory Visit: Payer: Self-pay

## 2022-04-09 NOTE — Progress Notes (Signed)
[place] OFFICE PROGRESS NOTE  Pcp, No No address on file  DIAGNOSIS: f/u of gastric cancer   Oncology History Overview Note   Cancer Staging  Gastric cancer [facility]) Staging form: Stomach, AJCC 8th Edition - Clinical stage from 10/18/2021: Stage III (cT3, cN3a, cM0) - Signed by Truitt Merle, MD on 11/10/2021     Gastric cancer  10/18/2021 Procedure   EGD performed under the care of Dr. Michail Sermon  Findings:  -A large, ulcerated, partially circumferential (involving two thirds of the lumen circumference) mass with no bleeding and stigmata of recent bleeding was found in the gastric antrum, at the pylorus and in the prepyloric region of the stomach. -Segmental severe inflammation characterized by congestion (edema) and erythema was found in the gastric antrum.   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 Negative for dysplasia.  Negative for helicobacter pylori  Mismatch Repair (MMR) Protein Imunohistochemistry (IHC): IHC Expression Result: MLH1: Preserved nuclear expression. MSH2: Preserved nuclear expression. MSH6: Preserved nuclear expression. PMS2: Preserved nuclear expression. Interpretation: NORMAL   10/31/2021 Initial Diagnosis   Gastric cancer ([place])   11/01/2021 Tumor Marker   CEA = [id number] (^)   11/06/2021 Procedure   Upper EUS, Dr. Paulita Fujita  Impression: - Normal esophagus. - A small amount of food (residue) in the stomach. - Congested, friable (with contact bleeding) and ulcerated mucosa in the prepyloric region of the stomach. -  Normal examined duodenum. - There was no evidence of significant pathology in the left lobe of the liver. - Many abnormal lymph nodes (over 10) were visualized in the gastrohepatic ligament (level 18), celiac region (level 20), perigastric region, peripancreatic region and aortocaval region. - Wall thickening was seen in the prepyloric region of the stomach. The thickening appeared to be primarily within the deep mucosa (Layer 2) but extended through the muscularis propria. - Overall constellation of findings consistent with at least T3 N3 Mx (at least stage III) gastric adenocarcinoma.   11/08/2021 Imaging   EXAM: CT CHEST, ABDOMEN, AND PELVIS WITH CONTRAST  IMPRESSION: 1. Similar irregular wall thickening of the gastric antrum, consistent with patient's known primary gastric neoplasm. 2. Increased size of the upper abdominal lymph nodes, concerning for worsening nodal disease involvement. 3. Stable left adrenal nodule common nonspecific possibly a metastatic lesion or adenoma. 4. No evidence of metastatic disease in the chest. 5. Left-sided colonic diverticulosis without findings of acute diverticulitis. 6. Enlarged prostate gland. 7.  Aortic Atherosclerosis (ICD10-I70.0).   11/22/2021 -  Chemotherapy   Patient is on Treatment Plan : GASTROESOPHAGEAL FLOT q14d X 4 cycles     11/27/2021 Imaging    IMPRESSION: 1. The mass within the gastric antrum is mildly decreased in size when compared with 11/08/2021. There is persistent FDG uptake associated with this mass compatible with residual tumor. 2. Persistent FDG avid lymph nodes within the gastrohepatic ligament, portacaval region, and mesentery. These are mildly decreased in size when compared with 11/08/2021. 3. Tracer avid left supraclavicular lymph node is also mildly decreased in size when compared with 11/08/2021. 4. Indeterminate left adrenal gland nodule with mild FDG uptake. This may represent a lipid poor adenoma.  Metastatic disease not exclude. 5. Diffuse increased uptake throughout the bone marrow is favored to represent treatment related change. 6.  Aortic Atherosclerosis (ICD10-I70.0).     02/07/2022 Imaging    IMPRESSION: 1. No substantial change in hypermetabolism associated with the distal gastric lesion. Although difficult to discern on noncontrast CT imaging, the soft tissue component appears to be decreased since the prior PET-CT. 2. Interval resolution of the hypermetabolic left supraclavicular lymph node. 3. Interval marked decrease of the hypermetabolic gastrohepatic ligament lymphadenopathy. Similar decrease in size and hypermetabolism associated with index nodes identified previously in the 4. porta hepatis and hypogastric region. 5. Stable left adrenal nodule with slight hypermetabolism. Nonspecific finding could represent lipid poor adenoma or metastatic deposit. 6. No new suspicious hypermetabolic disease on today's study. 7.  Aortic Atherosclerosis (ICD10-I70.0).     CURRENT THERAPY:  Neoadjuvant FLOT, q14d, starting 11/22/21   INTERVAL HISTORY: Jonathan Maynard 52 y.o. male returns to the clinic today for a follow-up visit accompanied by his wife.  The patient is currently undergoing neoadjuvant chemotherapy with FLOT.  He is here for cycle #11 today.  He is scheduled for cycle #12 on 04/25/2022 which is his last cycle of neoadjuvant treatment.  He is very eager to finish this. He is also scheduled to see genetic counseling upcoming on 05/01/2022.  He is followed closely by Dr. Sheliah Hatch from surgery. It appears surgery is tenatively scheduled for 06/25/22.   Overall, the patient denies any new symptoms today.  He is feeling well today overall.  With his treatment, he typically experiences fatigue following treatment as well as watery eyes/runny nose and some skin darkening (hands, back, and leg) secondary to his chemo.  He also gets some sore throat/throat sensitivity.  He uses lotion at home on his skin darkening and dry skin.  The patient denies any fever, chills, night sweats, or significant unexplained weight loss except for 3 lbs since last being seen.  He states that his appetite is "okay".  He denies any odynophagia or dysphagia.  Denies any hematemesis, nausea, vomiting, diarrhea, or constipation.  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   Denies any chest pain, shortness of breath, cough, or hemoptysis.  He denies any signs and symptoms of infection today including skin infections, burning with urination, abdominal pain, etc. The patient is here today for evaluation and repeat blood work before undergoing cycle #11.  MEDICAL HISTORY: Past Medical History:  Diagnosis Date   Acute upper GI bleed 04/12/2021   Class 1 obesity 04/12/2021   Diverticulosis 04/12/2021   Hepatic steatosis 04/12/2021    ALLERGIES:  has No Known Allergies.  MEDICATIONS:  Current Outpatient Medications  Medication Sig Dispense Refill   dexamethasone (DECADRON) 4 MG tablet Take 1 tablet by mouth twice a day the day before chemo.  Then take 1 tablet by mouth once daily for 2 days after chemo. 30 tablet 1   ferrous sulfate 325 (65 FE) MG tablet Take 1 tablet (325 mg total) by mouth daily. 30 tablet 3   lidocaine-prilocaine (EMLA) cream Apply to affected area as directed once (Patient not taking: Reported on 01/03/2022) 30 g 3   magic mouthwash (lidocaine, diphenhydrAMINE, alum & mag hydroxide) suspension Swish and spit 15 mLs 3 (three) times daily. 100 mL 0   magic mouthwash (multi-ingredient) oral suspension Swish, gargle and spit 5-10 mls by mouth every 6 hours as needed 480 mL 0   magic mouthwash SOLN Take  5 mLs by mouth 4 (four) times daily as needed for mouth pain. Swish, gargle, and spit one to two teaspoonfuls for 1 minute. Repeat every 6 hours as needed. 480 mL 0   ondansetron (ZOFRAN) 8 MG tablet Take 1 tablet (8 mg total) by  mouth every 8 (eight) hours as needed for nausea or vomiting. Start on the third day after chemotherapy. 30 tablet 1   pantoprazole (PROTONIX) 40 MG tablet Take 1 tablet (40 mg total) by mouth 2 (two) times daily. 60 tablet 3   prochlorperazine (COMPAZINE) 10 MG tablet Take 1 tablet (10 mg total) by mouth every 6 (six) hours as needed for nausea or vomiting. 30 tablet 1   No current facility-administered medications for this visit.    SURGICAL HISTORY:  Past Surgical History:  Procedure Laterality Date   BIOPSY  04/12/2021   Procedure: BIOPSY;  Surgeon: Charlott Rakes, MD;  Location: [facility];  Service: Gastroenterology;;   ESOPHAGOGASTRODUODENOSCOPY N/A 04/12/2021   Procedure: ESOPHAGOGASTRODUODENOSCOPY (EGD);  Surgeon: Charlott Rakes, MD;  Location: [facility];  Service: Gastroenterology;  Laterality: N/A;   ESOPHAGOGASTRODUODENOSCOPY N/A 11/06/2021   Procedure: ESOPHAGOGASTRODUODENOSCOPY (EGD);  Surgeon: Willis Modena, MD;  Location: [facility];  Service: Gastroenterology;  Laterality: N/A;   HERNIA REPAIR     IR IMAGING GUIDED PORT INSERTION  11/15/2021   UPPER ESOPHAGEAL ENDOSCOPIC ULTRASOUND (EUS) Bilateral 11/06/2021   Procedure: UPPER ESOPHAGEAL ENDOSCOPIC ULTRASOUND (EUS);  Surgeon: Willis Modena, MD;  Location: [facility];  Service: Gastroenterology;  Laterality: Bilateral;    REVIEW OF SYSTEMS:   Review of Systems  Constitutional: Positive for fatigue following treatment.  Positive for 3 pound weight loss.  Negative for appetite change, chills, and fever. HENT: Negative for mouth sores, nosebleeds, sore throat and trouble swallowing.   Eyes: Positive for watery eyes.  Negative for icterus.  Respiratory: Negative for cough, hemoptysis, shortness of breath and wheezing.   Cardiovascular: Negative for chest pain and leg swelling.  Gastrointestinal: Negative for abdominal pain, constipation, diarrhea, nausea and vomiting.  Genitourinary: Negative for bladder  incontinence, difficulty urinating, dysuria, frequency and hematuria.   Musculoskeletal: Negative for back pain, gait problem, neck pain and neck stiffness.  Skin: Positive for skin darkening and dry appearing skin on his hands, back, and leg. Neurological: Negative for dizziness, extremity weakness, gait problem, headaches, light-headedness and seizures.  Hematological: Negative for adenopathy. Does not bruise/bleed easily.  Psychiatric/Behavioral: Negative for confusion, depression and sleep disturbance. The patient is not nervous/anxious.     PHYSICAL EXAMINATION:  Blood pressure 124/78, pulse 67, temperature 98.7 F (37.1 C), temperature source Oral, resp. rate 16, weight 184 lb 1.6 oz (83.5 kg), SpO2 100 %.  ECOG PERFORMANCE STATUS: 1  Physical Exam  Constitutional: Oriented to person, place, and time and well-developed, well-nourished, and in no distress.  HENT:  Head: Normocephalic and atraumatic.  Mouth/Throat: Oropharynx is clear and moist. No oropharyngeal exudate.  Eyes: Positive for watery eyes.  Conjunctivae are normal. Right eye exhibits no discharge. Left eye exhibits no discharge. No scleral icterus.  Neck: Normal range of motion. Neck supple.  Cardiovascular: Normal rate, regular rhythm, normal heart sounds and intact distal pulses.   Pulmonary/Chest: Effort normal and breath sounds normal. No respiratory distress. No wheezes. No rales.  Abdominal: Soft. Bowel sounds are normal. Exhibits no distension and no mass. There is no tenderness.  Musculoskeletal: Normal range of motion. Exhibits no edema.  Lymphadenopathy:    No cervical adenopathy.  Neurological: Alert and oriented to person, place, and  time. Exhibits normal muscle tone. Gait normal. Coordination normal.  Skin: Skin is warm and dry.  Positive for dry skin and skin darkening on the hands, back, and lower extremity.  Not diaphoretic. No erythema. No pallor.  Psychiatric: Mood, memory and judgment normal.  Vitals  reviewed.  LABORATORY DATA: Lab Results  Component Value Date   WBC 23.9 (H) 04/11/2022   HGB 10.6 (L) 04/11/2022   HCT 32.4 (L) 04/11/2022   MCV 88.8 04/11/2022   PLT 180 04/11/2022      Chemistry      Component Value Date/Time   NA 139 04/11/2022 0759   K 3.6 04/11/2022 0759   CL 105 04/11/2022 0759   CO2 28 04/11/2022 0759   BUN 9 04/11/2022 0759   CREATININE 0.92 04/11/2022 0759      Component Value Date/Time   CALCIUM 9.1 04/11/2022 0759   ALKPHOS 95 04/11/2022 0759   AST 20 04/11/2022 0759   ALT 18 04/11/2022 0759   BILITOT 0.3 04/11/2022 0759       RADIOGRAPHIC STUDIES:  No results found.   ASSESSMENT/PLAN:  Jonathan Maynard is a 52 y.o. male with    Gastric cancer (HCC) stage III, cT3N3Mx, with hypermetabolic left supraclavicular node, and indeterminate adrenal nodule, MMR normal -diagnosed 10/18/21 by EGD for 6 month f/u of gastric ulcers. Procedure showed mucosal intramucosal adenocarcinoma. MMR normal. -baseline CEA 11/01/21 significantly elevated at 1,989.58. -he met Dr. Sheliah Hatch on 11/05/21. -EUS on 11/06/21 by Dr. Dulce Sellar, staged as T3 N3 (at least stage III) -staging CT CAP 11/08/21 showed: stable gastric antrum wall thickening; increased size of upper abdominal lymph nodes; stable left adrenal nodule, 1.9 cm. -he started neoadjuvant FLOT on 11/17.  -PET scan showed FDG avid lymph nodes within the gastrohepatic ligament, portacaval region, and mesentery. Tracer avid left supraclavicular lymph node and indeterminate left adrenal gland nodule with mild FDG uptake. -He underwent [place] node biopsy on 1/17 which was negative for malignant cells, but no lymphoid tissue seen on biopsy  -He is tolerating chemo FLOT well, will continue -Repeated PET on 02/07/22 showed resolved left supraclavicular lymph node, and significant improvement in regional lymph nodes and the primary tumor, stable and indeterminate left adrenal nodule.  -CT adrenal protocol showed  the left adrenal nodule is likely benign adenoma, no biopsy is needed. -Dr. Sheliah Hatch, surgery tentatively scheduled for 06/25/22 -Labs were reviewed. Recommend that he proceed with cycle #11 today as scheduled -We discussed using Refresh Tears if needed for his watery eyes is likely secondary to docetaxel.  Will continue using his lotion and I encouraged him to increase using his lotion to at least twice a day skin darkening and cracking. -He will return for labs, return visit, and infusion with last cycle (cycle #12) in 2 weeks.  -Scheduled to see genetic counseling on 05/01/22  PLAN: -lab reviewed -Surgery tentatively planned for 06/25/22 -proceed with C11 FLOT today  -Labs/flush, return visit and infusion for C12 in 2 weeks.  -Scheduled to see genetic counseling on 05/01/22  No orders of the defined types were placed in this encounter.    The total time spent in the appointment was 20-29 minutes  Obryan Radu L Jayceon Troy, PA-C 04/11/22

## 2022-04-10 MED FILL — Dexamethasone Sodium Phosphate Inj 100 MG/10ML: INTRAMUSCULAR | Qty: 1 | Status: AC

## 2022-04-11 ENCOUNTER — Inpatient Hospital Stay: Payer: BC Managed Care – PPO

## 2022-04-11 ENCOUNTER — Inpatient Hospital Stay: Payer: BC Managed Care – PPO | Attending: Physician Assistant | Admitting: Physician Assistant

## 2022-04-11 VITALS — BP 124/78 | HR 67 | Temp 98.7°F | Resp 16 | Wt 184.1 lb

## 2022-04-11 DIAGNOSIS — Z5189 Encounter for other specified aftercare: Secondary | ICD-10-CM | POA: Diagnosis not present

## 2022-04-11 DIAGNOSIS — C163 Malignant neoplasm of pyloric antrum: Secondary | ICD-10-CM | POA: Diagnosis not present

## 2022-04-11 DIAGNOSIS — Z7952 Long term (current) use of systemic steroids: Secondary | ICD-10-CM | POA: Diagnosis not present

## 2022-04-11 DIAGNOSIS — Z8619 Personal history of other infectious and parasitic diseases: Secondary | ICD-10-CM | POA: Insufficient documentation

## 2022-04-11 DIAGNOSIS — Z8711 Personal history of peptic ulcer disease: Secondary | ICD-10-CM | POA: Insufficient documentation

## 2022-04-11 DIAGNOSIS — E278 Other specified disorders of adrenal gland: Secondary | ICD-10-CM | POA: Insufficient documentation

## 2022-04-11 DIAGNOSIS — G629 Polyneuropathy, unspecified: Secondary | ICD-10-CM | POA: Diagnosis not present

## 2022-04-11 DIAGNOSIS — Z5111 Encounter for antineoplastic chemotherapy: Secondary | ICD-10-CM | POA: Insufficient documentation

## 2022-04-11 DIAGNOSIS — Z79899 Other long term (current) drug therapy: Secondary | ICD-10-CM | POA: Insufficient documentation

## 2022-04-11 DIAGNOSIS — Z95828 Presence of other vascular implants and grafts: Secondary | ICD-10-CM

## 2022-04-11 DIAGNOSIS — R5383 Other fatigue: Secondary | ICD-10-CM | POA: Diagnosis not present

## 2022-04-11 DIAGNOSIS — R970 Elevated carcinoembryonic antigen [CEA]: Secondary | ICD-10-CM | POA: Diagnosis not present

## 2022-04-11 LAB — CBC WITH DIFFERENTIAL (CANCER CENTER ONLY)
Abs Immature Granulocytes: 2.24 10*3/uL — ABNORMAL HIGH (ref 0.00–0.07)
Basophils Absolute: 0.2 10*3/uL — ABNORMAL HIGH (ref 0.0–0.1)
Basophils Relative: 1 %
Eosinophils Absolute: 0 10*3/uL (ref 0.0–0.5)
Eosinophils Relative: 0 %
HCT: 32.4 % — ABNORMAL LOW (ref 39.0–52.0)
Hemoglobin: 10.6 g/dL — ABNORMAL LOW (ref 13.0–17.0)
Immature Granulocytes: 9 %
Lymphocytes Relative: 22 %
Lymphs Abs: 5.2 10*3/uL — ABNORMAL HIGH (ref 0.7–4.0)
MCH: 29 pg (ref 26.0–34.0)
MCHC: 32.7 g/dL (ref 30.0–36.0)
MCV: 88.8 fL (ref 80.0–100.0)
Monocytes Absolute: 1.6 10*3/uL — ABNORMAL HIGH (ref 0.1–1.0)
Monocytes Relative: 7 %
Neutro Abs: 14.7 10*3/uL — ABNORMAL HIGH (ref 1.7–7.7)
Neutrophils Relative %: 61 %
Platelet Count: 180 10*3/uL (ref 150–400)
RBC: 3.65 MIL/uL — ABNORMAL LOW (ref 4.22–5.81)
RDW: 19.3 % — ABNORMAL HIGH (ref 11.5–15.5)
Smear Review: NORMAL
WBC Count: 23.9 10*3/uL — ABNORMAL HIGH (ref 4.0–10.5)
nRBC: 1.3 % — ABNORMAL HIGH (ref 0.0–0.2)

## 2022-04-11 LAB — CMP (CANCER CENTER ONLY)
ALT: 18 U/L (ref 0–44)
AST: 20 U/L (ref 15–41)
Albumin: 3.5 g/dL (ref 3.5–5.0)
Alkaline Phosphatase: 95 U/L (ref 38–126)
Anion gap: 6 (ref 5–15)
BUN: 9 mg/dL (ref 6–20)
CO2: 28 mmol/L (ref 22–32)
Calcium: 9.1 mg/dL (ref 8.9–10.3)
Chloride: 105 mmol/L (ref 98–111)
Creatinine: 0.92 mg/dL (ref 0.61–1.24)
GFR, Estimated: >60 mL/min (ref >60)
Glucose, Bld: 93 mg/dL (ref 70–99)
Potassium: 3.6 mmol/L (ref 3.5–5.1)
Sodium: 139 mmol/L (ref 135–145)
Total Bilirubin: 0.3 mg/dL (ref 0.3–1.2)
Total Protein: 6.3 g/dL — ABNORMAL LOW (ref 6.5–8.1)

## 2022-04-11 MED ORDER — SODIUM CHLORIDE 0.9 % IV SOLN
50.0000 mg/m2 | Freq: Once | INTRAVENOUS | Status: AC
  Administered 2022-04-11: 98 mg via INTRAVENOUS
  Filled 2022-04-11: qty 9.8

## 2022-04-11 MED ORDER — LEUCOVORIN CALCIUM INJECTION 350 MG
200.0000 mg/m2 | Freq: Once | INTRAVENOUS | Status: AC
  Administered 2022-04-11: 390 mg via INTRAVENOUS
  Filled 2022-04-11: qty 19.5

## 2022-04-11 MED ORDER — DEXTROSE 5 % IV SOLN: Freq: Once | INTRAVENOUS | Status: AC

## 2022-04-11 MED ORDER — OXALIPLATIN CHEMO INJECTION 100 MG/20ML
85.0000 mg/m2 | Freq: Once | INTRAVENOUS | Status: AC
  Administered 2022-04-11: 165 mg via INTRAVENOUS
  Filled 2022-04-11: qty 33

## 2022-04-11 MED ORDER — SODIUM CHLORIDE 0.9% FLUSH
10.0000 mL | Freq: Once | INTRAVENOUS | Status: AC
  Administered 2022-04-11: 10 mL

## 2022-04-11 MED ORDER — SODIUM CHLORIDE 0.9 % IV SOLN
2400.0000 mg/m2 | INTRAVENOUS | Status: DC
  Administered 2022-04-11: 5000 mg via INTRAVENOUS
  Filled 2022-04-11: qty 100

## 2022-04-11 MED ORDER — PALONOSETRON HCL INJECTION 0.25 MG/5ML
0.2500 mg | Freq: Once | INTRAVENOUS | Status: AC
  Administered 2022-04-11: 0.25 mg via INTRAVENOUS
  Filled 2022-04-11: qty 5

## 2022-04-11 MED ORDER — SODIUM CHLORIDE 0.9 % IV SOLN
10.0000 mg | Freq: Once | INTRAVENOUS | Status: AC
  Administered 2022-04-11: 10 mg via INTRAVENOUS
  Filled 2022-04-11: qty 10

## 2022-04-11 NOTE — Progress Notes (Signed)
Patient seen by Cassie Heilingoetter, PA-C  Vitals are within treatment parameters.  Labs reviewed: and are within treatment parameters.  Per physician team, patient is ready for treatment and there are NO modifications to the treatment plan.  

## 2022-04-12 ENCOUNTER — Inpatient Hospital Stay: Payer: BC Managed Care – PPO

## 2022-04-12 VITALS — BP 125/71 | HR 65 | Temp 99.1°F | Resp 18

## 2022-04-12 DIAGNOSIS — Z5111 Encounter for antineoplastic chemotherapy: Secondary | ICD-10-CM | POA: Diagnosis not present

## 2022-04-12 DIAGNOSIS — C163 Malignant neoplasm of pyloric antrum: Secondary | ICD-10-CM

## 2022-04-12 MED ORDER — PEGFILGRASTIM-JMDB 6 MG/0.6ML ~~LOC~~ SOSY
6.0000 mg | PREFILLED_SYRINGE | Freq: Once | SUBCUTANEOUS | Status: AC
  Administered 2022-04-12: 6 mg via SUBCUTANEOUS

## 2022-04-12 MED ORDER — HEPARIN SOD (PORK) LOCK FLUSH 100 UNIT/ML IV SOLN
500.0000 [IU] | Freq: Once | INTRAVENOUS | Status: AC | PRN
  Administered 2022-04-12: 500 [IU]

## 2022-04-12 MED ORDER — SODIUM CHLORIDE 0.9% FLUSH
10.0000 mL | INTRAVENOUS | Status: DC | PRN
  Administered 2022-04-12: 10 mL

## 2022-04-24 MED FILL — Dexamethasone Sodium Phosphate Inj 100 MG/10ML: INTRAMUSCULAR | Qty: 1 | Status: AC

## 2022-04-25 ENCOUNTER — Inpatient Hospital Stay: Payer: BC Managed Care – PPO

## 2022-04-25 ENCOUNTER — Inpatient Hospital Stay (HOSPITAL_BASED_OUTPATIENT_CLINIC_OR_DEPARTMENT_OTHER): Payer: BC Managed Care – PPO | Admitting: Hematology

## 2022-04-25 ENCOUNTER — Other Ambulatory Visit: Payer: Self-pay

## 2022-04-25 ENCOUNTER — Encounter: Payer: Self-pay | Admitting: Hematology

## 2022-04-25 VITALS — BP 129/86 | HR 76 | Temp 98.8°F | Resp 16 | Wt 187.4 lb

## 2022-04-25 DIAGNOSIS — Z95828 Presence of other vascular implants and grafts: Secondary | ICD-10-CM

## 2022-04-25 DIAGNOSIS — Z5111 Encounter for antineoplastic chemotherapy: Secondary | ICD-10-CM | POA: Diagnosis not present

## 2022-04-25 DIAGNOSIS — C163 Malignant neoplasm of pyloric antrum: Secondary | ICD-10-CM

## 2022-04-25 LAB — CMP (CANCER CENTER ONLY)
ALT: 18 U/L (ref 0–44)
AST: 20 U/L (ref 15–41)
Albumin: 3.7 g/dL (ref 3.5–5.0)
Alkaline Phosphatase: 108 U/L (ref 38–126)
Anion gap: 7 (ref 5–15)
BUN: 10 mg/dL (ref 6–20)
CO2: 27 mmol/L (ref 22–32)
Calcium: 9 mg/dL (ref 8.9–10.3)
Chloride: 104 mmol/L (ref 98–111)
Creatinine: 0.89 mg/dL (ref 0.61–1.24)
GFR, Estimated: >60 mL/min (ref >60)
Glucose, Bld: 101 mg/dL — ABNORMAL HIGH (ref 70–99)
Potassium: 3.6 mmol/L (ref 3.5–5.1)
Sodium: 138 mmol/L (ref 135–145)
Total Bilirubin: 0.4 mg/dL (ref 0.3–1.2)
Total Protein: 6.3 g/dL — ABNORMAL LOW (ref 6.5–8.1)

## 2022-04-25 LAB — CBC WITH DIFFERENTIAL (CANCER CENTER ONLY)
Abs Immature Granulocytes: 2.95 10*3/uL — ABNORMAL HIGH (ref 0.00–0.07)
Basophils Absolute: 0 10*3/uL (ref 0.0–0.1)
Basophils Relative: 0 %
Eosinophils Absolute: 0 10*3/uL (ref 0.0–0.5)
Eosinophils Relative: 0 %
HCT: 33.2 % — ABNORMAL LOW (ref 39.0–52.0)
Hemoglobin: 10.5 g/dL — ABNORMAL LOW (ref 13.0–17.0)
Immature Granulocytes: 11 %
Lymphocytes Relative: 16 %
Lymphs Abs: 4.4 10*3/uL — ABNORMAL HIGH (ref 0.7–4.0)
MCH: 29.2 pg (ref 26.0–34.0)
MCHC: 31.6 g/dL (ref 30.0–36.0)
MCV: 92.2 fL (ref 80.0–100.0)
Monocytes Absolute: 2 10*3/uL — ABNORMAL HIGH (ref 0.1–1.0)
Monocytes Relative: 7 %
Neutro Abs: 18.7 10*3/uL — ABNORMAL HIGH (ref 1.7–7.7)
Neutrophils Relative %: 66 %
Platelet Count: 194 10*3/uL (ref 150–400)
RBC: 3.6 MIL/uL — ABNORMAL LOW (ref 4.22–5.81)
RDW: 19.8 % — ABNORMAL HIGH (ref 11.5–15.5)
WBC Count: 28.1 10*3/uL — ABNORMAL HIGH (ref 4.0–10.5)
nRBC: 1.2 % — ABNORMAL HIGH (ref 0.0–0.2)

## 2022-04-25 MED ORDER — PALONOSETRON HCL INJECTION 0.25 MG/5ML
0.2500 mg | Freq: Once | INTRAVENOUS | Status: AC
  Administered 2022-04-25: 0.25 mg via INTRAVENOUS
  Filled 2022-04-25: qty 5

## 2022-04-25 MED ORDER — DEXTROSE 5 % IV SOLN: Freq: Once | INTRAVENOUS | Status: AC

## 2022-04-25 MED ORDER — SODIUM CHLORIDE 0.9 % IV SOLN
50.0000 mg/m2 | Freq: Once | INTRAVENOUS | Status: AC
  Administered 2022-04-25: 98 mg via INTRAVENOUS
  Filled 2022-04-25: qty 9.8

## 2022-04-25 MED ORDER — OXALIPLATIN CHEMO INJECTION 100 MG/20ML
85.0000 mg/m2 | Freq: Once | INTRAVENOUS | Status: AC
  Administered 2022-04-25: 165 mg via INTRAVENOUS
  Filled 2022-04-25: qty 33

## 2022-04-25 MED ORDER — SODIUM CHLORIDE 0.9 % IV SOLN
10.0000 mg | Freq: Once | INTRAVENOUS | Status: AC
  Administered 2022-04-25: 10 mg via INTRAVENOUS
  Filled 2022-04-25: qty 10

## 2022-04-25 MED ORDER — LEUCOVORIN CALCIUM INJECTION 350 MG
200.0000 mg/m2 | Freq: Once | INTRAVENOUS | Status: AC
  Administered 2022-04-25: 390 mg via INTRAVENOUS
  Filled 2022-04-25: qty 19.5

## 2022-04-25 MED ORDER — SODIUM CHLORIDE 0.9% FLUSH: 10.0000 mL | INTRAVENOUS | Status: DC | PRN

## 2022-04-25 MED ORDER — SODIUM CHLORIDE 0.9 % IV SOLN
2400.0000 mg/m2 | INTRAVENOUS | Status: DC
  Administered 2022-04-25: 5000 mg via INTRAVENOUS
  Filled 2022-04-25: qty 100

## 2022-04-25 MED ORDER — HEPARIN SOD (PORK) LOCK FLUSH 100 UNIT/ML IV SOLN: 500.0000 [IU] | Freq: Once | INTRAVENOUS | Status: DC | PRN

## 2022-04-25 MED ORDER — SODIUM CHLORIDE 0.9% FLUSH
10.0000 mL | Freq: Once | INTRAVENOUS | Status: AC
  Administered 2022-04-25: 10 mL

## 2022-04-25 NOTE — Patient Instructions (Signed)
[facility]  Discharge Instructions: Thank you for choosing [facility] to provide your oncology and hematology care.   If you have a lab appointment with the Cancer Center, please go directly to the Cancer Center and check in at the registration area.   Wear comfortable clothing and clothing appropriate for easy access to any Portacath or PICC line.   We strive to give you quality time with your provider. You may need to reschedule your appointment if you arrive late (15 or more minutes).  Arriving late affects you and other patients whose appointments are after yours.  Also, if you miss three or more appointments without notifying the office, you may be dismissed from the clinic at the provider's discretion.      For prescription refill requests, have your pharmacy contact our office and allow 72 hours for refills to be completed.    Today you received the following chemotherapy and/or immunotherapy agents: Docetaxel, Oxaliplatin, Leucovorin, Fluorouracil.       To help prevent nausea and vomiting after your treatment, we encourage you to take your nausea medication as directed.  BELOW ARE SYMPTOMS THAT SHOULD BE REPORTED IMMEDIATELY: *FEVER GREATER THAN 100.4 F (38 C) OR HIGHER *CHILLS OR SWEATING *NAUSEA AND VOMITING THAT IS NOT CONTROLLED WITH YOUR NAUSEA MEDICATION *UNUSUAL SHORTNESS OF BREATH *UNUSUAL BRUISING OR BLEEDING *URINARY PROBLEMS (pain or burning when urinating, or frequent urination) *BOWEL PROBLEMS (unusual diarrhea, constipation, pain near the anus) TENDERNESS IN MOUTH AND THROAT WITH OR WITHOUT PRESENCE OF ULCERS (sore throat, sores in mouth, or a toothache) UNUSUAL RASH, SWELLING OR PAIN  UNUSUAL VAGINAL DISCHARGE OR ITCHING   Items with * indicate a potential emergency and should be followed up as soon as possible or go to the Emergency Department if any problems should occur.  Please show the CHEMOTHERAPY ALERT  CARD or IMMUNOTHERAPY ALERT CARD at check-in to the Emergency Department and triage nurse.  Should you have questions after your visit or need to cancel or reschedule your appointment, please contact [facility]  Dept: [phone]  and follow the prompts.  Office hours are 8:00 a.m. to 4:30 p.m. Monday - Friday. Please note that voicemails left after 4:00 p.m. may not be returned until the following business day.  We are closed weekends and major holidays. You have access to a nurse at all times for urgent questions. Please call the main number to the clinic Dept: [phone] and follow the prompts.   For any non-urgent questions, you may also contact your provider using MyChart. We now offer e-Visits for anyone 18 and older to request care online for non-urgent symptoms. For details visit [email or web address].   Also download the MyChart app! Go to the app store, search "MyChart", open the app, select [facility], and log in with your MyChart username and password.   

## 2022-04-25 NOTE — Progress Notes (Signed)
[facility]   Telephone:([phone] Fax:([phone]   Clinic Follow up Note   Patient Care Team: Pcp, No as PCP - General Malachy Mood, MD as Consulting Physician (Hematology and Oncology)  Date of Service:  04/25/2022  CHIEF COMPLAINT: f/u of gastric cancer   CURRENT THERAPY:  Neoadjuvant FLOT, q14d, starting 11/22/21    ASSESSMENT:  Jonathan Maynard is a 52 y.o. male with   Gastric cancer (HCC) stage III, cT3N3Mx, with hypermetabolic left supraclavicular node, and indeterminate adrenal nodule, MMR normal -diagnosed 10/18/21 by EGD for 6 month f/u of gastric ulcers. Procedure showed mucosal intramucosal adenocarcinoma. MMR normal. -baseline CEA 11/01/21 significantly elevated at 1,989.58. -he met Dr. Sheliah Hatch on 11/05/21. -EUS on 11/06/21 by Dr. Dulce Sellar, staged as T3 N3 (at least stage III) -staging CT CAP 11/08/21 showed: stable gastric antrum wall thickening; increased size of upper abdominal lymph nodes; stable left adrenal nodule, 1.9 cm. -he started neoadjuvant FLOT on 11/17.  -PET scan showed FDG avid lymph nodes within the gastrohepatic ligament, portacaval region, and mesentery. Tracer avid left supraclavicular lymph node and indeterminate left adrenal gland nodule with mild FDG uptake. -he underwent [place] node biopsy on 1/17 which was negative for malignant cells, but no lymphoid tissue seen on biopsy  -he is tolerating chemo FLOT well, s/p 11 cycles, will continue -repeated PET on 02/07/22 showed resolved left supraclavicular lymph node, and significant improvement in regional lymph nodes and the primary tumor, stable and indeterminate left adrenal nodule.  -CT adrenal protocol showed the left adrenal nodule is likely benign adenoma, no biopsy is needed. -he is scheduled for surgery on 06/25/2022, I will check with his surgeon Dr. Ellouise Newer to see if I he can move his surgery to late May -Lab reviewed, adequate for treatment, will proceed cycle 12 FLOT  today at the same dose, he is tolerating very well, no neuropathy.  This is last cycle scheduled. -Will obtain a PET scan in 2 to 3 weeks to evaluate his response, and ensure no cancer progression before his surgery.   PLAN: -lab reviewed -Proceed with cycle 12 FLOT today  -I messaged to his surgeon Dr. Ellouise Newer to see if we can move his surgery date to late May  -Follow-up in 3 weeks with lab and PET scan a few days before.  SUMMARY OF ONCOLOGIC HISTORY: Oncology History Overview Note   Cancer Staging  Gastric cancer [facility]) Staging form: Stomach, AJCC 8th Edition - Clinical stage from 10/18/2021: Stage III (cT3, cN3a, cM0) - Signed by Malachy Mood, MD on 11/10/2021     Gastric cancer  10/18/2021 Procedure   EGD performed under the care of Dr. Bosie Clos  Findings:  -A large, ulcerated, partially circumferential (involving two thirds of the lumen circumference) mass with no bleeding and stigmata of recent bleeding was found in the gastric antrum, at the pylorus and in the prepyloric region of the stomach. -Segmental severe inflammation characterized by congestion (edema) and erythema was found in the gastric antrum.   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 Negative for dysplasia.  Negative for helicobacter pylori  Mismatch Repair (MMR) Protein Imunohistochemistry (IHC): IHC  Expression Result: MLH1: Preserved nuclear expression. MSH2: Preserved nuclear expression. MSH6: Preserved nuclear expression. PMS2: Preserved nuclear expression. Interpretation: NORMAL    10/31/2021 Initial Diagnosis   Gastric cancer (HCC)   11/01/2021 Tumor Marker   CEA = 1,610.96 (^)   11/06/2021 Procedure   Upper EUS, Dr. Dulce Sellar  Impression: - Normal esophagus. - A small amount of food (residue) in the stomach. - Congested, friable (with contact bleeding) and ulcerated mucosa in the prepyloric region of the stomach. - Normal examined duodenum. - There was no evidence of significant pathology in the left lobe of the liver. - Many abnormal lymph nodes (over 10) were visualized in the gastrohepatic ligament (level 18), celiac region (level 20), perigastric region, peripancreatic region and aortocaval region. - Wall thickening was seen in the prepyloric region of the stomach. The thickening appeared to be primarily within the deep mucosa (Layer 2) but extended through the muscularis propria. - Overall constellation of findings consistent with at least T3 N3 Mx (at least stage III) gastric adenocarcinoma.   11/08/2021 Imaging   EXAM: CT CHEST, ABDOMEN, AND PELVIS WITH CONTRAST  IMPRESSION: 1. Similar irregular wall thickening of the gastric antrum, consistent with patient's known primary gastric neoplasm. 2. Increased size of the upper abdominal lymph nodes, concerning for worsening nodal disease involvement. 3. Stable left adrenal nodule common nonspecific possibly a metastatic lesion or adenoma. 4. No evidence of metastatic disease in the chest. 5. Left-sided colonic diverticulosis without findings of acute diverticulitis. 6. Enlarged prostate gland. 7.  Aortic Atherosclerosis (ICD10-I70.0).   11/22/2021 -  Chemotherapy   Patient is on Treatment Plan : GASTROESOPHAGEAL FLOT q14d X 4 cycles     11/27/2021 Imaging    IMPRESSION: 1. The mass within the gastric antrum is mildly decreased in size when compared with 11/08/2021. There is persistent FDG uptake associated with this mass compatible with residual tumor. 2. Persistent FDG avid lymph nodes within the  gastrohepatic ligament, portacaval region, and mesentery. These are mildly decreased in size when compared with 11/08/2021. 3. Tracer avid left supraclavicular lymph node is also mildly decreased in size when compared with 11/08/2021. 4. Indeterminate left adrenal gland nodule with mild FDG uptake. This may represent a lipid poor adenoma. Metastatic disease not exclude. 5. Diffuse increased uptake throughout the bone marrow is favored to represent treatment related change. 6.  Aortic Atherosclerosis (ICD10-I70.0).     02/07/2022 Imaging    IMPRESSION: 1. No substantial change in hypermetabolism associated with the distal gastric lesion. Although difficult to discern on noncontrast CT imaging, the soft tissue component appears to be decreased since the prior PET-CT. 2. Interval resolution of the hypermetabolic left supraclavicular lymph node. 3. Interval marked decrease of the hypermetabolic gastrohepatic ligament lymphadenopathy. Similar decrease in size and hypermetabolism associated with index nodes identified previously in the 4. porta hepatis and hypogastric region. 5. Stable left adrenal nodule with slight hypermetabolism. Nonspecific finding could represent lipid poor adenoma or metastatic deposit. 6. No new suspicious hypermetabolic disease on today's study. 7.  Aortic Atherosclerosis (ICD10-I70.0).      INTERVAL HISTORY:  Jonathan Maynard is here for a follow up of gastric cancer  He was last seen by me on 03/28/2022. He presents to the clinic accompanied by wife.  He has watery eyes, which is from his chemotherapy.  He is otherwise tolerating chemotherapy very well, he lost one of his toenails, no other no side effect from chemotherapy.  He denies any neuropathy.  He does take a few days off  work, but back to work for most the time through his chemotherapy.  All other systems were reviewed with the patient and are negative.  MEDICAL HISTORY:  Past Medical  History:  Diagnosis Date   Acute upper GI bleed 04/12/2021   Class 1 obesity 04/12/2021   Diverticulosis 04/12/2021   Hepatic steatosis 04/12/2021    SURGICAL HISTORY: Past Surgical History:  Procedure Laterality Date   BIOPSY  04/12/2021   Procedure: BIOPSY;  Surgeon: Charlott Rakes, MD;  Location: [facility];  Service: Gastroenterology;;   ESOPHAGOGASTRODUODENOSCOPY N/A 04/12/2021   Procedure: ESOPHAGOGASTRODUODENOSCOPY (EGD);  Surgeon: Charlott Rakes, MD;  Location: [facility];  Service: Gastroenterology;  Laterality: N/A;   ESOPHAGOGASTRODUODENOSCOPY N/A 11/06/2021   Procedure: ESOPHAGOGASTRODUODENOSCOPY (EGD);  Surgeon: Willis Modena, MD;  Location: [facility];  Service: Gastroenterology;  Laterality: N/A;   HERNIA REPAIR     IR IMAGING GUIDED PORT INSERTION  11/15/2021   UPPER ESOPHAGEAL ENDOSCOPIC ULTRASOUND (EUS) Bilateral 11/06/2021   Procedure: UPPER ESOPHAGEAL ENDOSCOPIC ULTRASOUND (EUS);  Surgeon: Willis Modena, MD;  Location: [facility];  Service: Gastroenterology;  Laterality: Bilateral;    I have reviewed the social history and family history with the patient and they are unchanged from previous note.  ALLERGIES:  has No Known Allergies.  MEDICATIONS:  Current Outpatient Medications  Medication Sig Dispense Refill   dexamethasone (DECADRON) 4 MG tablet Take 1 tablet by mouth twice a day the day before chemo.  Then take 1 tablet by mouth once daily for 2 days after chemo. 30 tablet 1   ferrous sulfate 325 (65 FE) MG tablet Take 1 tablet (325 mg total) by mouth daily. 30 tablet 3   lidocaine-prilocaine (EMLA) cream Apply to affected area as directed once (Patient not taking: Reported on 01/03/2022) 30 g 3   magic mouthwash (lidocaine, diphenhydrAMINE, alum & mag hydroxide) suspension Swish and spit 15 mLs 3 (three) times daily. 100 mL 0   magic mouthwash (multi-ingredient) oral suspension Swish, gargle and spit 5-10 mls by mouth every 6 hours as needed 480  mL 0   magic mouthwash SOLN Take 5 mLs by mouth 4 (four) times daily as needed for mouth pain. Swish, gargle, and spit one to two teaspoonfuls for 1 minute. Repeat every 6 hours as needed. 480 mL 0   ondansetron (ZOFRAN) 8 MG tablet Take 1 tablet (8 mg total) by mouth every 8 (eight) hours as needed for nausea or vomiting. Start on the third day after chemotherapy. 30 tablet 1   pantoprazole (PROTONIX) 40 MG tablet Take 1 tablet (40 mg total) by mouth 2 (two) times daily. 60 tablet 3   prochlorperazine (COMPAZINE) 10 MG tablet Take 1 tablet (10 mg total) by mouth every 6 (six) hours as needed for nausea or vomiting. 30 tablet 1   No current facility-administered medications for this visit.    PHYSICAL EXAMINATION: ECOG PERFORMANCE STATUS: 1 - Symptomatic but completely ambulatory  Vitals:   04/25/22 0819  BP: 129/86  Pulse: 76  Resp: 16  Temp: 98.8 F (37.1 C)  SpO2: 99%    Wt Readings from Last 3 Encounters:  04/25/22 187 lb 6.4 oz (85 kg)  04/11/22 184 lb 1.6 oz (83.5 kg)  03/28/22 187 lb 14.4 oz (85.2 kg)     GENERAL:alert, no distress and comfortable SKIN: skin color normal, no rashes or significant lesions EYES: normal, Conjunctiva are pink and non-injected, sclera clear  NEURO: alert & oriented x 3 with fluent speech   LABORATORY DATA:  I have  reviewed the data as listed    Latest Ref Rng & Units 04/25/2022    7:57 AM 04/11/2022    7:59 AM 03/28/2022    8:17 AM  CBC  WBC 4.0 - 10.5 K/uL 28.1  23.9  27.1   Hemoglobin 13.0 - 17.0 g/dL 16.1  09.6  04.5   Hematocrit 39.0 - 52.0 % 33.2  32.4  33.6   Platelets 150 - 400 K/uL 194  180  174         Latest Ref Rng & Units 04/25/2022    7:57 AM 04/11/2022    7:59 AM 03/28/2022    8:17 AM  CMP  Glucose 70 - 99 mg/dL 409  93  811   BUN 6 - 20 mg/dL Creatinine 0.61 - 1.24 mg/dL 9.14  7.82  9.56   Sodium 135 - 145 mmol/L 138  139  139   Potassium 3.5 - 5.1 mmol/L 3.6  3.6  3.6   Chloride 98 - 111 mmol/L 104   105  104   CO2 22 - 32 mmol/L Calcium 8.9 - 10.3 mg/dL 9.0  9.1  8.9   Total Protein 6.5 - 8.1 g/dL 6.3  6.3  5.9   Total Bilirubin 0.3 - 1.2 mg/dL 0.4  0.3  0.2   Alkaline Phos 38 - 126 U/L 108  95  100   AST 15 - 41 U/L ALT 0 - 44 U/L RADIOGRAPHIC STUDIES: I have personally reviewed the radiological images as listed and agreed with the findings in the report. No results found.    Orders Placed This Encounter  Procedures   NM PET Image Restag (PS) Skull Base To Thigh    Standing Status:   Future    Standing Expiration Date:   04/25/2023    Order Specific Question:   If indicated for the ordered procedure, I authorize the administration of a radiopharmaceutical per Radiology protocol    Answer:   Yes    Order Specific Question:   Preferred imaging location?    Answer:   [facility]   All questions were answered. The patient knows to call the clinic with any problems, questions or concerns. No barriers to learning was detected. The total time spent in the appointment was 30 minutes.     Malachy Mood, MD 04/25/2022

## 2022-04-26 ENCOUNTER — Inpatient Hospital Stay: Payer: BC Managed Care – PPO

## 2022-04-26 ENCOUNTER — Other Ambulatory Visit (HOSPITAL_COMMUNITY): Payer: Self-pay

## 2022-04-26 VITALS — BP 126/75 | HR 86 | Temp 97.7°F | Resp 16

## 2022-04-26 DIAGNOSIS — Z5111 Encounter for antineoplastic chemotherapy: Secondary | ICD-10-CM | POA: Diagnosis not present

## 2022-04-26 DIAGNOSIS — C163 Malignant neoplasm of pyloric antrum: Secondary | ICD-10-CM

## 2022-04-26 MED ORDER — SODIUM CHLORIDE 0.9% FLUSH
10.0000 mL | INTRAVENOUS | Status: DC | PRN
  Administered 2022-04-26: 10 mL

## 2022-04-26 MED ORDER — HEPARIN SOD (PORK) LOCK FLUSH 100 UNIT/ML IV SOLN
500.0000 [IU] | Freq: Once | INTRAVENOUS | Status: AC | PRN
  Administered 2022-04-26: 500 [IU]

## 2022-04-26 MED ORDER — PEGFILGRASTIM-JMDB 6 MG/0.6ML ~~LOC~~ SOSY
6.0000 mg | PREFILLED_SYRINGE | Freq: Once | SUBCUTANEOUS | Status: AC
  Administered 2022-04-26: 6 mg via SUBCUTANEOUS

## 2022-04-28 ENCOUNTER — Other Ambulatory Visit (HOSPITAL_COMMUNITY): Payer: Self-pay

## 2022-04-28 ENCOUNTER — Other Ambulatory Visit: Payer: Self-pay

## 2022-04-28 MED ORDER — PANTOPRAZOLE SODIUM 40 MG PO TBEC
40.0000 mg | DELAYED_RELEASE_TABLET | Freq: Two times a day (BID) | ORAL | 6 refills | Status: DC
  Filled 2022-04-28: qty 60, 30d supply, fill #0
  Filled 2022-07-15: qty 60, 30d supply, fill #1

## 2022-04-29 ENCOUNTER — Other Ambulatory Visit: Payer: Self-pay

## 2022-04-30 ENCOUNTER — Telehealth: Payer: Self-pay | Admitting: Hematology

## 2022-05-01 ENCOUNTER — Encounter: Payer: BC Managed Care – PPO | Admitting: Genetic Counselor

## 2022-05-01 ENCOUNTER — Other Ambulatory Visit: Payer: BC Managed Care – PPO

## 2022-05-06 ENCOUNTER — Other Ambulatory Visit: Payer: Self-pay

## 2022-05-09 ENCOUNTER — Other Ambulatory Visit (HOSPITAL_COMMUNITY): Payer: BC Managed Care – PPO

## 2022-05-09 NOTE — Progress Notes (Signed)
Sent message, via epic in basket, requesting orders in epic from surgeon.  

## 2022-05-10 ENCOUNTER — Ambulatory Visit: Payer: Self-pay | Admitting: General Surgery

## 2022-05-15 NOTE — Progress Notes (Addendum)
COVID Vaccine received:  []  No [x]  Yes Date of any COVID positive Test in last 90 days: None  PCP - none Cardiologist - none Oncology- Malachy Mood, MD  Chest x-ray -  EKG -  11-15-2021  Epic Stress Test -  ECHO -  Cardiac Cath -   PCR screen: []  Ordered & Completed           []   No Order but Needs PROFEND           [x]   N/A for this surgery  Surgery Plan:  []  Ambulatory                            []  Outpatient in bed                            [x]  Admit  Anesthesia:    [x]  General  []  Spinal                           []   Choice []   MAC  Bowel Prep - [x]  No  []   Yes ______  Pacemaker / ICD device [x]  No []  Yes   Spinal Cord Stimulator:[x]  No []  Yes       History of Sleep Apnea? [x]  No []  Yes   CPAP used?- [x]  No []  Yes    Does the patient monitor blood sugar?          []  No []  Yes  [x]  N/A  Patient has: [x]  NO Hx DM   []  Pre-DM                 []  DM1  []   DM2  Blood Thinner / Instructions:  none Aspirin Instructions:  none  ERAS Protocol Ordered: []  No  [x]  Yes PRE-SURGERY [x]  ENSURE  []  G2  Patient is to be NPO after: 10:00  Activity level: Patient is able to climb a flight of stairs without difficulty; [x]  No CP  [x]  No SOB, but would have foot pain and instability. Patient can perform ADLs without assistance.   Anesthesia review: Has PortaCath (Right Chest), Stomach cancer- current Chemo, Fatty liver, anemia  Patient denies shortness of breath, fever, cough and chest pain at PAT appointment.  Patient verbalized understanding and agreement to the Pre-Surgical Instructions that were given to them at this PAT appointment. Patient was also educated of the need to review these PAT instructions again prior to his surgery.I reviewed the appropriate phone numbers to call if they have any and questions or concerns.

## 2022-05-15 NOTE — Patient Instructions (Addendum)
________________________________________________________________________  SURGICAL WAITING ROOM VISITATION Patients having surgery or a procedure may have no more than 2 support people in the waiting area - these visitors may rotate in the visitor waiting room.   Due to an increase in RSV and influenza rates and associated hospitalizations, children ages 24 and under may not visit patients in [facility] hospitals. If the patient needs to stay at the hospital during part of their recovery, the visitor guidelines for inpatient rooms apply.  PRE-OP VISITATION  Pre-op nurse will coordinate an appropriate time for 1 support person to accompany the patient in pre-op.  This support person may not rotate.  This visitor will be contacted when the time is appropriate for the visitor to come back in the pre-op area.  Please refer to the [facility] website for the visitor guidelines for Inpatients (after your surgery is over and you are in a regular room).  You are not required to quarantine at this time prior to your surgery. However, you must do this: Hand Hygiene often Do NOT share personal items Notify your provider if you are in close contact with someone who has COVID or you develop fever 100.4 or greater, new onset of sneezing, cough, sore throat, shortness of breath or body aches.  If you test positive for Covid or have been in contact with anyone that has tested positive in the last 10 days please notify you surgeon.    Your procedure is scheduled on:  Tuesday  May 20, 2022  Report to [facility] Main Entrance: [address] entrance where the [employer] is available.   Report to admitting at: 10;45    AM  +++++Call this number if you have any questions or problems the morning of surgery [phone]  Do not eat food after Midnight the night prior to your surgery/procedure.  After Midnight you may have the following liquids until   10:00 AM DAY OF  SURGERY  Clear Liquid Diet Water Black Coffee (sugar ok, NO MILK/CREAM OR CREAMERS)  Tea (sugar ok, NO MILK/CREAM OR CREAMERS) regular and decaf                             Plain Jell-O  with no fruit (NO RED)                                           Fruit ices (not with fruit pulp, NO RED)                                     Popsicles (NO RED)                                                                  Juice: apple, WHITE grape, WHITE cranberry Sports drinks like Gatorade or Powerade (NO RED)                   The day of surgery:  Drink ONE (1) Pre-Surgery Clear Ensure at  10:00 AM the morning of surgery. Drink in  one sitting. Do not sip.  This drink was given to you during your hospital pre-op appointment visit. Nothing else to drink after completing the Pre-Surgery Clear Ensure: No candy, chewing gum or throat lozenges.    FOLLOW BOWEL PREP AND ANY ADDITIONAL PRE OP INSTRUCTIONS YOU RECEIVED FROM YOUR SURGEON'S OFFICE!!!   Oral Hygiene is also important to reduce your risk of infection.        Remember - BRUSH YOUR TEETH THE MORNING OF SURGERY WITH YOUR REGULAR TOOTHPASTE  Do NOT smoke after Midnight the night before surgery.  Take ONLY these medicines the morning of surgery with A SIP OF WATER: Pantoprazole (Protonix) and Tylenol if needed                    You may not have any metal on your body including  jewelry, and body piercing  Do not wear  lotions, powders, cologne, or deodorant   Men may shave face and neck.   You may bring a small overnight bag with you on the day of surgery, only pack items that are not valuable. [facility] IS NOT RESPONSIBLE   FOR VALUABLES THAT ARE LOST OR STOLEN.   Do not bring your home medications to the hospital. The Pharmacy will dispense medications listed on your medication list to you during your admission in the Hospital.  Please read over the following fact sheets you were given: IF YOU HAVE QUESTIONS ABOUT YOUR PRE-OP  INSTRUCTIONS, PLEASE CALL [phone].   [facility] - Preparing for Surgery Before surgery, you can play an important role.  Because skin is not sterile, your skin needs to be as free of germs as possible.  You can reduce the number of germs on your skin by washing with CHG (chlorahexidine gluconate) soap before surgery.  CHG is an antiseptic cleaner which kills germs and bonds with the skin to continue killing germs even after washing. Please DO NOT use if you have an allergy to CHG or antibacterial soaps.  If your skin becomes reddened/irritated stop using the CHG and inform your nurse when you arrive at Short Stay. Do not shave (including legs and underarms) for at least 48 hours prior to the first CHG shower.  You may shave your face/neck.  Please follow these instructions carefully:  1.  Shower with CHG Soap the night before surgery and the  morning of surgery.  2.  If you choose to wash your hair, wash your hair first as usual with your normal  shampoo.  3.  After you shampoo, rinse your hair and body thoroughly to remove the shampoo.                             4.  Use CHG as you would any other liquid soap.  You can apply chg directly to the skin and wash.  Gently with a scrungie or clean washcloth.  5.  Apply the CHG Soap to your body ONLY FROM THE NECK DOWN.   Do not use on face/ open                           Wound or open sores. Avoid contact with eyes, ears mouth and genitals (private parts).                       Wash face,  Genitals (private parts) with your  normal soap.             6.  Wash thoroughly, paying special attention to the area where your  surgery  will be performed.  7.  Thoroughly rinse your body with warm water from the neck down.  8.  DO NOT shower/wash with your normal soap after using and rinsing off the CHG Soap.            9.  Pat yourself dry with a clean towel.            10.  Wear clean pajamas.            11.  Place clean sheets on your bed the night of  your first shower and do not  sleep with pets.  ON THE DAY OF SURGERY : Do not apply any lotions/deodorants the morning of surgery.  Please wear clean clothes to the hospital/surgery center.    FAILURE TO FOLLOW THESE INSTRUCTIONS MAY RESULT IN THE CANCELLATION OF YOUR SURGERY  PATIENT SIGNATURE_________________________________  NURSE SIGNATURE__________________________________  ________________________________________________________________________

## 2022-05-16 ENCOUNTER — Other Ambulatory Visit: Payer: Self-pay

## 2022-05-16 ENCOUNTER — Encounter (HOSPITAL_COMMUNITY)
Admission: RE | Admit: 2022-05-16 | Discharge: 2022-05-16 | Disposition: A | Discharge status: Home / Self Care | Payer: BC Managed Care – PPO | Source: Ambulatory Visit | Attending: Hematology | Admitting: Hematology

## 2022-05-16 ENCOUNTER — Encounter (HOSPITAL_COMMUNITY)
Admission: RE | Admit: 2022-05-16 | Discharge: 2022-05-16 | Disposition: A | Discharge status: Home / Self Care | Payer: BC Managed Care – PPO | Source: Ambulatory Visit | Attending: General Surgery | Admitting: General Surgery

## 2022-05-16 ENCOUNTER — Encounter (HOSPITAL_COMMUNITY): Payer: Self-pay

## 2022-05-16 VITALS — BP 110/75 | HR 74 | Temp 98.7°F | Resp 18 | Ht 65.0 in | Wt 178.0 lb

## 2022-05-16 DIAGNOSIS — C163 Malignant neoplasm of pyloric antrum: Secondary | ICD-10-CM | POA: Insufficient documentation

## 2022-05-16 DIAGNOSIS — Z01818 Encounter for other preprocedural examination: Secondary | ICD-10-CM

## 2022-05-16 DIAGNOSIS — K760 Fatty (change of) liver, not elsewhere classified: Secondary | ICD-10-CM

## 2022-05-16 HISTORY — DX: Anemia, unspecified: D64.9

## 2022-05-16 HISTORY — DX: Malignant (primary) neoplasm, unspecified: C80.1

## 2022-05-16 HISTORY — DX: Gastro-esophageal reflux disease without esophagitis: K21.9

## 2022-05-16 HISTORY — DX: Myoneural disorder, unspecified: G70.9

## 2022-05-16 LAB — COMPREHENSIVE METABOLIC PANEL
ALT: 18 U/L (ref 0–44)
AST: 17 U/L (ref 15–41)
Albumin: 3.6 g/dL (ref 3.5–5.0)
Alkaline Phosphatase: 62 U/L (ref 38–126)
Anion gap: 7 (ref 5–15)
BUN: 10 mg/dL (ref 6–20)
CO2: 28 mmol/L (ref 22–32)
Calcium: 9.1 mg/dL (ref 8.9–10.3)
Chloride: 106 mmol/L (ref 98–111)
Creatinine, Ser: 0.96 mg/dL (ref 0.61–1.24)
GFR, Estimated: >60 mL/min (ref >60)
Glucose, Bld: 95 mg/dL (ref 70–99)
Potassium: 4.3 mmol/L (ref 3.5–5.1)
Sodium: 141 mmol/L (ref 135–145)
Total Bilirubin: 0.5 mg/dL (ref 0.3–1.2)
Total Protein: 6.6 g/dL (ref 6.5–8.1)

## 2022-05-16 LAB — CBC
HCT: 35.6 % — ABNORMAL LOW (ref 39.0–52.0)
Hemoglobin: 10.8 g/dL — ABNORMAL LOW (ref 13.0–17.0)
MCH: 29.2 pg (ref 26.0–34.0)
MCHC: 30.3 g/dL (ref 30.0–36.0)
MCV: 96.2 fL (ref 80.0–100.0)
Platelets: 223 10*3/uL (ref 150–400)
RBC: 3.7 MIL/uL — ABNORMAL LOW (ref 4.22–5.81)
RDW: 19.5 % — ABNORMAL HIGH (ref 11.5–15.5)
WBC: 11.3 10*3/uL — ABNORMAL HIGH (ref 4.0–10.5)
nRBC: 0 % (ref 0.0–0.2)

## 2022-05-16 LAB — GLUCOSE, CAPILLARY: Glucose-Capillary: 100 mg/dL — ABNORMAL HIGH (ref 70–99)

## 2022-05-16 MED ORDER — FLUDEOXYGLUCOSE F - 18 (FDG) INJECTION
9.2300 | Freq: Once | INTRAVENOUS | Status: AC
  Administered 2022-05-16: 9.23 via INTRAVENOUS

## 2022-05-18 NOTE — Assessment & Plan Note (Addendum)
stage III, cT3N3Mx, with hypermetabolic left supraclavicular node, and indeterminate adrenal nodule, MMR normal -diagnosed 10/18/21 by EGD for 6 month f/u of gastric ulcers. Procedure showed mucosal intramucosal adenocarcinoma. MMR normal. -baseline CEA 11/01/21 significantly elevated at 1,989.58. -he met Dr. Sheliah Hatch on 11/05/21. -EUS on 11/06/21 by Dr. Dulce Sellar, staged as T3 N3 (at least stage III) -staging CT CAP 11/08/21 showed: stable gastric antrum wall thickening; increased size of upper abdominal lymph nodes; stable left adrenal nodule, 1.9 cm. -he started neoadjuvant FLOT on 11/17.  -PET scan showed FDG avid lymph nodes within the gastrohepatic ligament, portacaval region, and mesentery. Tracer avid left supraclavicular lymph node and indeterminate left adrenal gland nodule with mild FDG uptake. -he underwent [place] node biopsy on 1/17 which was negative for malignant cells, but no lymphoid tissue seen on biopsy  -repeated PET on 02/07/22 showed resolved left supraclavicular lymph node, and significant improvement in regional lymph nodes and the primary tumor, stable and indeterminate left adrenal nodule.  -CT adrenal protocol showed the left adrenal nodule is likely benign adenoma, no biopsy is needed. -he received neoadjvant chemo FLOT well, s/p 12 cycles, completed on 04/27/2022 -restaGING PET on 05/16/22 showed  -He is scheduled for surgery on 5/14, I recommend him to keep the port

## 2022-05-19 ENCOUNTER — Encounter: Payer: Self-pay | Admitting: Hematology

## 2022-05-19 ENCOUNTER — Inpatient Hospital Stay: Payer: BC Managed Care – PPO | Attending: Physician Assistant | Admitting: Hematology

## 2022-05-19 ENCOUNTER — Other Ambulatory Visit: Payer: Self-pay

## 2022-05-19 VITALS — BP 140/88 | HR 86 | Temp 98.7°F | Resp 15 | Ht 65.0 in | Wt 183.5 lb

## 2022-05-19 DIAGNOSIS — C163 Malignant neoplasm of pyloric antrum: Secondary | ICD-10-CM

## 2022-05-19 NOTE — Progress Notes (Signed)
[facility]   Telephone:(336) [phone] Fax:(336) [phone]   Clinic Follow up Note   Patient Care Team: Pcp, No as PCP - General Malachy Mood, MD as Consulting Physician (Hematology and Oncology)  Date of Service:  05/19/2022  CHIEF COMPLAINT: f/u of gastric cancer   CURRENT THERAPY:  Surgery tomorrow   ASSESSMENT:  Jonathan Maynard is a 52 y.o. male with   Gastric cancer (HCC) stage III, cT3N3Mx, with hypermetabolic left supraclavicular node, and indeterminate adrenal nodule, MMR normal -diagnosed 10/18/21 by EGD for 6 month f/u of gastric ulcers. Procedure showed mucosal intramucosal adenocarcinoma. MMR normal. -baseline CEA 11/01/21 significantly elevated at 1,989.58. -he met Dr. Sheliah Hatch on 11/05/21. -EUS on 11/06/21 by Dr. Dulce Sellar, staged as T3 N3 (at least stage III) -staging CT CAP 11/08/21 showed: stable gastric antrum wall thickening; increased size of upper abdominal lymph nodes; stable left adrenal nodule, 1.9 cm. -he started neoadjuvant FLOT on 11/17.  -PET scan showed FDG avid lymph nodes within the gastrohepatic ligament, portacaval region, and mesentery. Tracer avid left supraclavicular lymph node and indeterminate left adrenal gland nodule with mild FDG uptake. -he underwent [place] node biopsy on 1/17 which was negative for malignant cells, but no lymphoid tissue seen on biopsy  -repeated PET on 02/07/22 showed resolved left supraclavicular lymph node, and significant improvement in regional lymph nodes and the primary tumor, stable and indeterminate left adrenal nodule.  -CT adrenal protocol showed the left adrenal nodule is likely benign adenoma, no biopsy is needed. -he received neoadjvant chemo FLOT well, s/p 12 cycles, completed on 04/27/2022 -restaging PET on 05/16/22 showed significantly hypermetabolic activity at the primary gastric cancer in pylorus, and a small hypermetabolic mesenteric lymph node, no other evidence of metastasis.  I personally  reviewed his scan images and discussed the findings with patient.  I have informed his surgeon Dr. Sheliah Hatch about his PET findings -He is scheduled for surgery on 5/14, I recommend him to keep the port     PLAN: -PET scan reviewed -He will proceed his gastric cancer surgery as scheduled tomorrow. -f/u in one month    SUMMARY OF ONCOLOGIC HISTORY: Oncology History Overview Note   Cancer Staging  Gastric cancer [facility]) Staging form: Stomach, AJCC 8th Edition - Clinical stage from 10/18/2021: Stage III (cT3, cN3a, cM0) - Signed by Malachy Mood, MD on 11/10/2021     Gastric cancer (HCC)  10/18/2021 Procedure   EGD performed under the care of Dr. Bosie Clos  Findings:  -A large, ulcerated, partially circumferential (involving two thirds of the lumen circumference) mass with no bleeding and stigmata of recent bleeding was found in the gastric antrum, at the pylorus and in the prepyloric region of the stomach. -Segmental severe inflammation characterized by congestion (edema) and erythema was found in the gastric antrum.   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 Negative for dysplasia.  Negative for helicobacter pylori  Mismatch Repair (MMR) Protein Imunohistochemistry (IHC): IHC Expression Result: MLH1: Preserved nuclear expression. MSH2: Preserved nuclear expression. MSH6: Preserved nuclear expression. PMS2: Preserved nuclear expression. Interpretation: NORMAL   10/31/2021 Initial Diagnosis   Gastric cancer (HCC)   11/01/2021 Tumor  Marker   CEA = 0,981.19  (^)   11/06/2021 Procedure   Upper EUS, Dr. Dulce Sellar  Impression: - Normal esophagus. - A small amount of food (residue) in the stomach. - Congested, friable (with contact bleeding) and ulcerated mucosa in the prepyloric region of the stomach. - Normal examined duodenum. - There was no evidence of significant pathology in the left lobe of the liver. - Many abnormal lymph nodes (over 10) were visualized in the gastrohepatic ligament (level 18), celiac region (level 20), perigastric region, peripancreatic region and aortocaval region. - Wall thickening was seen in the prepyloric region of the stomach. The thickening appeared to be primarily within the deep mucosa (Layer 2) but extended through the muscularis propria. - Overall constellation of findings consistent with at least T3 N3 Mx (at least stage III) gastric adenocarcinoma.   11/08/2021 Imaging   EXAM: CT CHEST, ABDOMEN, AND PELVIS WITH CONTRAST  IMPRESSION: 1. Similar irregular wall thickening of the gastric antrum, consistent with patient's known primary gastric neoplasm. 2. Increased size of the upper abdominal lymph nodes, concerning for worsening nodal disease involvement. 3. Stable left adrenal nodule common nonspecific possibly a metastatic lesion or adenoma. 4. No evidence of metastatic disease in the chest. 5. Left-sided colonic diverticulosis without findings of acute diverticulitis. 6. Enlarged prostate gland. 7.  Aortic Atherosclerosis (ICD10-I70.0).   11/22/2021 -  Chemotherapy   Patient is on Treatment Plan : GASTROESOPHAGEAL FLOT q14d X 4 cycles     11/27/2021 Imaging    IMPRESSION: 1. The mass within the gastric antrum is mildly decreased in size when compared with 11/08/2021. There is persistent FDG uptake associated with this mass compatible with residual tumor. 2. Persistent FDG avid lymph nodes within the gastrohepatic ligament, portacaval region, and mesentery. These are mildly decreased in size when  compared with 11/08/2021. 3. Tracer avid left supraclavicular lymph node is also mildly decreased in size when compared with 11/08/2021. 4. Indeterminate left adrenal gland nodule with mild FDG uptake. This may represent a lipid poor adenoma. Metastatic disease not exclude. 5. Diffuse increased uptake throughout the bone marrow is favored to represent treatment related change. 6.  Aortic Atherosclerosis (ICD10-I70.0).     02/07/2022 Imaging    IMPRESSION: 1. No substantial change in hypermetabolism associated with the distal gastric lesion. Although difficult to discern on noncontrast CT imaging, the soft tissue component appears to be decreased since the prior PET-CT. 2. Interval resolution of the hypermetabolic left supraclavicular lymph node. 3. Interval marked decrease of the hypermetabolic gastrohepatic ligament lymphadenopathy. Similar decrease in size and hypermetabolism associated with index nodes identified previously in the 4. porta hepatis and hypogastric region. 5. Stable left adrenal nodule with slight hypermetabolism. Nonspecific finding could represent lipid poor adenoma or metastatic deposit. 6. No new suspicious hypermetabolic disease on today's study. 7.  Aortic Atherosclerosis (ICD10-I70.0).      INTERVAL HISTORY:  Jonathan Maynard is here for a follow up of gastric cancer.  He was last seen by me on 04/25/2022 He presents to the clinic accompanied by his wife.  He has recovered well from chemotherapy, mild neuropathy is stable.  No other new complaints.   All other systems were reviewed with the patient and are negative.  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     SURGICAL HISTORY: Past Surgical History:  Procedure Laterality Date   BIOPSY  04/12/2021   Procedure: BIOPSY;  Surgeon: Charlott Rakes, MD;  Location: [facility];  Service: Gastroenterology;;   ESOPHAGOGASTRODUODENOSCOPY N/A 04/12/2021   Procedure: ESOPHAGOGASTRODUODENOSCOPY (EGD);  Surgeon: Charlott Rakes, MD;  Location: [facility];  Service: Gastroenterology;  Laterality: N/A;   ESOPHAGOGASTRODUODENOSCOPY N/A 11/06/2021   Procedure: ESOPHAGOGASTRODUODENOSCOPY (EGD);  Surgeon: Willis Modena, MD;  Location: [facility];  Service: Gastroenterology;  Laterality: N/A;   HERNIA REPAIR Left    inguinal   IR IMAGING GUIDED PORT INSERTION  11/15/2021   UPPER ESOPHAGEAL ENDOSCOPIC ULTRASOUND (EUS) Bilateral 11/06/2021   Procedure: UPPER ESOPHAGEAL ENDOSCOPIC ULTRASOUND (EUS);  Surgeon: Willis Modena, MD;  Location: [facility];  Service: Gastroenterology;  Laterality: Bilateral;    I have reviewed the social history and family history with the patient and they are unchanged from previous note.  ALLERGIES:  has No Known Allergies.  MEDICATIONS:  Current Outpatient Medications  Medication Sig Dispense Refill   acetaminophen (TYLENOL) 500 MG tablet Take 500 mg by mouth 2 (two) times daily.     dexamethasone (DECADRON) 4 MG tablet Take 1 tablet by mouth twice a day the day before chemo.  Then take 1 tablet by mouth once daily for 2 days after chemo. 30 tablet 1   ferrous sulfate 325 (65 FE) MG tablet Take 1 tablet (325 mg total) by mouth daily. (Patient not taking: Reported on 05/15/2022) 30 tablet 3   lidocaine-prilocaine (EMLA) cream Apply to affected area as directed once (Patient taking differently: Apply 1 Application topically daily as needed (port access).) 30 g 3   magic mouthwash (lidocaine, diphenhydrAMINE, alum & mag hydroxide) suspension Swish and spit 15 mLs 3 (three) times daily. (Patient taking differently: Swish and spit 15 mLs 3 (three) times daily as needed for  mouth pain.) 100 mL 0   magic mouthwash (multi-ingredient) oral suspension Swish, gargle and spit 5-10 mls by mouth every 6 hours as needed (Patient not taking: Reported on 05/15/2022) 480 mL 0   magic mouthwash SOLN Take 5 mLs by mouth 4 (four) times daily as needed for mouth pain. Swish, gargle, and spit one to two teaspoonfuls for 1 minute. Repeat every 6 hours as needed. (Patient not taking: Reported on 05/15/2022) 480 mL 0   ondansetron (ZOFRAN) 8 MG tablet Take 1 tablet (8 mg total) by mouth every 8 (eight) hours as needed for nausea or vomiting. Start on the third day after chemotherapy. 30 tablet 1   pantoprazole (PROTONIX) 40 MG tablet Take 1 tablet (40 mg) by mouth 2 times daily. 60 tablet 6   prochlorperazine (COMPAZINE) 10 MG tablet Take 1 tablet (10 mg total) by mouth every 6 (six) hours as needed for nausea or vomiting. 30 tablet 1   No current facility-administered medications for this visit.    PHYSICAL EXAMINATION: ECOG PERFORMANCE STATUS: 1 - Symptomatic but completely ambulatory  Vitals:   05/19/22 1222  BP: (!) 140/88  Pulse: 86  Resp: 15  Temp: 98.7 F (37.1 C)  SpO2: 100%   Wt Readings from Last 3 Encounters:  05/19/22 183 lb 8 oz (83.2 kg)  05/16/22 178 lb (80.7 kg)  04/25/22 187 lb 6.4 oz (85 kg)     GENERAL:alert, no distress and comfortable SKIN: skin color, texture, turgor are normal, no rashes or significant lesions EYES: normal, Conjunctiva are pink and non-injected,  sclera clear NECK: supple, thyroid normal size, non-tender, without nodularity LYMPH:  no palpable lymphadenopathy in the cervical, axillary  LUNGS: clear to auscultation and percussion with normal breathing effort HEART: regular rate & rhythm and no murmurs and no lower extremity edema ABDOMEN:abdomen soft, non-tender and normal bowel sounds Musculoskeletal:no cyanosis of digits and no clubbing  NEURO: alert & oriented x 3 with fluent speech, no focal motor/sensory deficits  LABORATORY  DATA:  I have reviewed the data as listed    Latest Ref Rng & Units 05/16/2022    8:42 AM 04/25/2022    7:57 AM 04/11/2022    7:59 AM  CBC  WBC 4.0 - 10.5 K/uL 11.3  28.1  23.9   Hemoglobin 13.0 - 17.0 g/dL 16.1  09.6  04.5   Hematocrit 39.0 - 52.0 % 35.6  33.2  32.4   Platelets 150 - 400 K/uL 223  194  180         Latest Ref Rng & Units 05/16/2022    8:42 AM 04/25/2022    7:57 AM 04/11/2022    7:59 AM  CMP  Glucose 70 - 99 mg/dL 95  409  93   BUN 6 - 20 mg/dL 10  10  9    Creatinine 0.61 - 1.24 mg/dL 8.11  9.14  7.82   Sodium 135 - 145 mmol/L 141  138  139   Potassium 3.5 - 5.1 mmol/L 4.3  3.6  3.6   Chloride 98 - 111 mmol/L 106  104  105   CO2 22 - 32 mmol/L 28  27  28    Calcium 8.9 - 10.3 mg/dL 9.1  9.0  9.1   Total Protein 6.5 - 8.1 g/dL 6.6  6.3  6.3   Total Bilirubin 0.3 - 1.2 mg/dL 0.5  0.4  0.3   Alkaline Phos 38 - 126 U/L 62  108  95   AST 15 - 41 U/L 17  20  20    ALT 0 - 44 U/L 18  18  18        RADIOGRAPHIC STUDIES: I have personally reviewed the radiological images as listed and agreed with the findings in the report. No results found.    No orders of the defined types were placed in this encounter.  All questions were answered. The patient knows to call the clinic with any problems, questions or concerns. No barriers to learning was detected. The total time spent in the appointment was 30 minutes.     Malachy Mood, MD 05/19/2022   Carolin Coy, CMA, am acting as scribe for Malachy Mood, MD.   I have reviewed the above documentation for accuracy and completeness, and I agree with the above.

## 2022-05-20 ENCOUNTER — Other Ambulatory Visit: Payer: Self-pay

## 2022-05-20 ENCOUNTER — Encounter (HOSPITAL_COMMUNITY): Payer: Self-pay | Admitting: General Surgery

## 2022-05-20 ENCOUNTER — Inpatient Hospital Stay (HOSPITAL_COMMUNITY): Payer: BC Managed Care – PPO | Admitting: Anesthesiology

## 2022-05-20 ENCOUNTER — Inpatient Hospital Stay (HOSPITAL_COMMUNITY)
Admission: RE | Admit: 2022-05-20 | Discharge: 2022-05-24 | DRG: 327 | Disposition: A | Discharge status: Home / Self Care | Payer: BC Managed Care – PPO | Attending: General Surgery | Admitting: General Surgery

## 2022-05-20 ENCOUNTER — Encounter (HOSPITAL_COMMUNITY)
Admission: RE | Disposition: A | Discharge status: Home / Self Care | Payer: Self-pay | Source: Home / Self Care | Attending: General Surgery

## 2022-05-20 DIAGNOSIS — C772 Secondary and unspecified malignant neoplasm of intra-abdominal lymph nodes: Secondary | ICD-10-CM | POA: Diagnosis present

## 2022-05-20 DIAGNOSIS — E669 Obesity, unspecified: Secondary | ICD-10-CM | POA: Diagnosis present

## 2022-05-20 DIAGNOSIS — Z5331 Laparoscopic surgical procedure converted to open procedure: Secondary | ICD-10-CM

## 2022-05-20 DIAGNOSIS — C169 Malignant neoplasm of stomach, unspecified: Principal | ICD-10-CM | POA: Diagnosis present

## 2022-05-20 DIAGNOSIS — Z6831 Body mass index (BMI) 31.0-31.9, adult: Secondary | ICD-10-CM | POA: Diagnosis not present

## 2022-05-20 DIAGNOSIS — C164 Malignant neoplasm of pylorus: Secondary | ICD-10-CM | POA: Diagnosis present

## 2022-05-20 DIAGNOSIS — Z87891 Personal history of nicotine dependence: Secondary | ICD-10-CM | POA: Diagnosis not present

## 2022-05-20 DIAGNOSIS — D62 Acute posthemorrhagic anemia: Secondary | ICD-10-CM | POA: Diagnosis not present

## 2022-05-20 DIAGNOSIS — Z9221 Personal history of antineoplastic chemotherapy: Secondary | ICD-10-CM

## 2022-05-20 DIAGNOSIS — Z833 Family history of diabetes mellitus: Secondary | ICD-10-CM

## 2022-05-20 HISTORY — PX: GASTRECTOMY: SHX58

## 2022-05-20 HISTORY — PX: LAPAROSCOPY: SHX197

## 2022-05-20 LAB — BASIC METABOLIC PANEL
Anion gap: 7 (ref 5–15)
BUN: 9 mg/dL (ref 6–20)
CO2: 26 mmol/L (ref 22–32)
Calcium: 8.4 mg/dL — ABNORMAL LOW (ref 8.9–10.3)
Chloride: 107 mmol/L (ref 98–111)
Creatinine, Ser: 0.94 mg/dL (ref 0.61–1.24)
GFR, Estimated: >60 mL/min (ref >60)
Glucose, Bld: 115 mg/dL — ABNORMAL HIGH (ref 70–99)
Potassium: 3.6 mmol/L (ref 3.5–5.1)
Sodium: 140 mmol/L (ref 135–145)

## 2022-05-20 LAB — CBC
HCT: 28.7 % — ABNORMAL LOW (ref 39.0–52.0)
Hemoglobin: 8.8 g/dL — ABNORMAL LOW (ref 13.0–17.0)
MCH: 29.8 pg (ref 26.0–34.0)
MCHC: 30.7 g/dL (ref 30.0–36.0)
MCV: 97.3 fL (ref 80.0–100.0)
Platelets: 174 10*3/uL (ref 150–400)
RBC: 2.95 MIL/uL — ABNORMAL LOW (ref 4.22–5.81)
RDW: 18.8 % — ABNORMAL HIGH (ref 11.5–15.5)
WBC: 16.1 10*3/uL — ABNORMAL HIGH (ref 4.0–10.5)
nRBC: 0 % (ref 0.0–0.2)

## 2022-05-20 LAB — HIV ANTIBODY (ROUTINE TESTING W REFLEX): HIV Screen 4th Generation wRfx: NONREACTIVE

## 2022-05-20 SURGERY — GASTRECTOMY, TOTAL
Anesthesia: General

## 2022-05-20 MED ORDER — ACETAMINOPHEN 10 MG/ML IV SOLN: 1000.0000 mg | Freq: Once | INTRAVENOUS | Status: DC | PRN

## 2022-05-20 MED ORDER — ONDANSETRON HCL 4 MG/2ML IJ SOLN: 4.0000 mg | Freq: Four times a day (QID) | INTRAMUSCULAR | Status: DC | PRN

## 2022-05-20 MED ORDER — HYDROMORPHONE HCL 2 MG/ML IJ SOLN
INTRAMUSCULAR | Status: AC
  Filled 2022-05-20: qty 1

## 2022-05-20 MED ORDER — ALBUMIN HUMAN 5 % IV SOLN
INTRAVENOUS | Status: AC
  Filled 2022-05-20: qty 500

## 2022-05-20 MED ORDER — LACTATED RINGERS IV SOLN: INTRAVENOUS | Status: DC

## 2022-05-20 MED ORDER — BUPIVACAINE-EPINEPHRINE 0.25% -1:200000 IJ SOLN
INTRAMUSCULAR | Status: DC | PRN
  Administered 2022-05-20: 10 mL

## 2022-05-20 MED ORDER — BUPIVACAINE-EPINEPHRINE 0.25% -1:200000 IJ SOLN
INTRAMUSCULAR | Status: AC
  Filled 2022-05-20: qty 1

## 2022-05-20 MED ORDER — DIPHENHYDRAMINE HCL 12.5 MG/5ML PO ELIX: 12.5000 mg | ORAL_SOLUTION | Freq: Four times a day (QID) | ORAL | Status: DC | PRN

## 2022-05-20 MED ORDER — MIDAZOLAM HCL 5 MG/5ML IJ SOLN
INTRAMUSCULAR | Status: DC | PRN
  Administered 2022-05-20: 2 mg via INTRAVENOUS

## 2022-05-20 MED ORDER — OXYCODONE HCL 5 MG/5ML PO SOLN: 5.0000 mg | Freq: Once | ORAL | Status: DC | PRN

## 2022-05-20 MED ORDER — DIPHENHYDRAMINE HCL 50 MG/ML IJ SOLN: 25.0000 mg | Freq: Four times a day (QID) | INTRAMUSCULAR | Status: DC | PRN

## 2022-05-20 MED ORDER — ALBUMIN HUMAN 5 % IV SOLN: INTRAVENOUS | Status: DC | PRN

## 2022-05-20 MED ORDER — 0.9 % SODIUM CHLORIDE (POUR BTL) OPTIME
TOPICAL | Status: DC | PRN
  Administered 2022-05-20: 2000 mL

## 2022-05-20 MED ORDER — DEXAMETHASONE SODIUM PHOSPHATE 10 MG/ML IJ SOLN
INTRAMUSCULAR | Status: AC
  Filled 2022-05-20: qty 1

## 2022-05-20 MED ORDER — ACETAMINOPHEN 650 MG RE SUPP: 650.0000 mg | Freq: Four times a day (QID) | RECTAL | Status: DC | PRN

## 2022-05-20 MED ORDER — HYDROMORPHONE HCL 1 MG/ML IJ SOLN
1.0000 mg | INTRAMUSCULAR | Status: DC | PRN
  Administered 2022-05-21 – 2022-05-22 (×3): 1 mg via INTRAVENOUS
  Filled 2022-05-20 (×4): qty 1

## 2022-05-20 MED ORDER — SUGAMMADEX SODIUM 200 MG/2ML IV SOLN
INTRAVENOUS | Status: DC | PRN
  Administered 2022-05-20: 200 mg via INTRAVENOUS

## 2022-05-20 MED ORDER — PROPOFOL 10 MG/ML IV BOLUS
INTRAVENOUS | Status: DC | PRN
  Administered 2022-05-20: 20 mg via INTRAVENOUS
  Administered 2022-05-20: 160 mg via INTRAVENOUS

## 2022-05-20 MED ORDER — ROCURONIUM BROMIDE 10 MG/ML (PF) SYRINGE
PREFILLED_SYRINGE | INTRAVENOUS | Status: AC
  Filled 2022-05-20: qty 10

## 2022-05-20 MED ORDER — ACETAMINOPHEN 500 MG PO TABS
1000.0000 mg | ORAL_TABLET | ORAL | Status: AC
  Administered 2022-05-20: 1000 mg via ORAL
  Filled 2022-05-20: qty 2

## 2022-05-20 MED ORDER — FENTANYL CITRATE PF 50 MCG/ML IJ SOSY
PREFILLED_SYRINGE | INTRAMUSCULAR | Status: AC
  Administered 2022-05-20: 50 ug via INTRAVENOUS
  Filled 2022-05-20: qty 3

## 2022-05-20 MED ORDER — CELECOXIB 200 MG PO CAPS
400.0000 mg | ORAL_CAPSULE | ORAL | Status: AC
  Administered 2022-05-20: 400 mg via ORAL
  Filled 2022-05-20: qty 2

## 2022-05-20 MED ORDER — ACETAMINOPHEN 325 MG PO TABS: 650.0000 mg | ORAL_TABLET | Freq: Four times a day (QID) | ORAL | Status: DC | PRN

## 2022-05-20 MED ORDER — ENOXAPARIN SODIUM 40 MG/0.4ML IJ SOSY
40.0000 mg | PREFILLED_SYRINGE | INTRAMUSCULAR | Status: DC
  Administered 2022-05-21 – 2022-05-23 (×3): 40 mg via SUBCUTANEOUS
  Filled 2022-05-20 (×4): qty 0.4

## 2022-05-20 MED ORDER — ONDANSETRON HCL 4 MG/2ML IJ SOLN
INTRAMUSCULAR | Status: DC | PRN
  Administered 2022-05-20: 4 mg via INTRAVENOUS

## 2022-05-20 MED ORDER — DEXTROSE-NACL 5-0.45 % IV SOLN: INTRAVENOUS | Status: DC

## 2022-05-20 MED ORDER — ORAL CARE MOUTH RINSE: 15.0000 mL | Freq: Once | OROMUCOSAL | Status: AC

## 2022-05-20 MED ORDER — KETAMINE HCL 10 MG/ML IJ SOLN
INTRAMUSCULAR | Status: DC | PRN
  Administered 2022-05-20: 50 mg via INTRAVENOUS

## 2022-05-20 MED ORDER — NALOXONE HCL 0.4 MG/ML IJ SOLN: 0.4000 mg | INTRAMUSCULAR | Status: DC | PRN

## 2022-05-20 MED ORDER — ENSURE PRE-SURGERY PO LIQD
296.0000 mL | Freq: Once | ORAL | Status: DC
  Filled 2022-05-20: qty 296

## 2022-05-20 MED ORDER — ROCURONIUM BROMIDE 10 MG/ML (PF) SYRINGE
PREFILLED_SYRINGE | INTRAVENOUS | Status: DC | PRN
  Administered 2022-05-20: 80 mg via INTRAVENOUS

## 2022-05-20 MED ORDER — FENTANYL CITRATE PF 50 MCG/ML IJ SOSY
25.0000 ug | PREFILLED_SYRINGE | INTRAMUSCULAR | Status: DC | PRN
  Administered 2022-05-20: 50 ug via INTRAVENOUS

## 2022-05-20 MED ORDER — CHLORHEXIDINE GLUCONATE 0.12 % MT SOLN
15.0000 mL | Freq: Once | OROMUCOSAL | Status: AC
  Administered 2022-05-20: 15 mL via OROMUCOSAL

## 2022-05-20 MED ORDER — CHLORHEXIDINE GLUCONATE CLOTH 2 % EX PADS: 6.0000 | MEDICATED_PAD | Freq: Once | CUTANEOUS | Status: DC

## 2022-05-20 MED ORDER — LACTATED RINGERS IV SOLN: INTRAVENOUS | Status: DC | PRN

## 2022-05-20 MED ORDER — KETAMINE HCL 50 MG/5ML IJ SOSY
PREFILLED_SYRINGE | INTRAMUSCULAR | Status: AC
  Filled 2022-05-20: qty 5

## 2022-05-20 MED ORDER — METOPROLOL TARTRATE 5 MG/5ML IV SOLN: 5.0000 mg | Freq: Four times a day (QID) | INTRAVENOUS | Status: DC | PRN

## 2022-05-20 MED ORDER — ARTIFICIAL TEARS OPHTHALMIC OINT
TOPICAL_OINTMENT | OPHTHALMIC | Status: AC
  Filled 2022-05-20: qty 3.5

## 2022-05-20 MED ORDER — DEXAMETHASONE SODIUM PHOSPHATE 10 MG/ML IJ SOLN
INTRAMUSCULAR | Status: DC | PRN
  Administered 2022-05-20: 10 mg via INTRAVENOUS

## 2022-05-20 MED ORDER — SODIUM CHLORIDE 0.9 % IV SOLN
2.0000 g | INTRAVENOUS | Status: AC
  Administered 2022-05-20: 2 g via INTRAVENOUS
  Filled 2022-05-20: qty 2

## 2022-05-20 MED ORDER — MIDAZOLAM HCL 2 MG/2ML IJ SOLN
INTRAMUSCULAR | Status: AC
  Filled 2022-05-20: qty 2

## 2022-05-20 MED ORDER — OXYCODONE HCL 5 MG PO TABS: 5.0000 mg | ORAL_TABLET | Freq: Once | ORAL | Status: DC | PRN

## 2022-05-20 MED ORDER — SODIUM CHLORIDE 0.9% FLUSH: 9.0000 mL | INTRAVENOUS | Status: DC | PRN

## 2022-05-20 MED ORDER — DIPHENHYDRAMINE HCL 50 MG/ML IJ SOLN: 12.5000 mg | Freq: Four times a day (QID) | INTRAMUSCULAR | Status: DC | PRN

## 2022-05-20 MED ORDER — PANTOPRAZOLE SODIUM 40 MG IV SOLR
40.0000 mg | Freq: Every day | INTRAVENOUS | Status: DC
  Administered 2022-05-20 – 2022-05-23 (×4): 40 mg via INTRAVENOUS
  Filled 2022-05-20 (×4): qty 10

## 2022-05-20 MED ORDER — FENTANYL CITRATE (PF) 100 MCG/2ML IJ SOLN
INTRAMUSCULAR | Status: DC | PRN
  Administered 2022-05-20 (×2): 50 ug via INTRAVENOUS

## 2022-05-20 MED ORDER — ACETAMINOPHEN 500 MG PO TABS: 1000.0000 mg | ORAL_TABLET | Freq: Once | ORAL | Status: DC | PRN

## 2022-05-20 MED ORDER — LIDOCAINE 2% (20 MG/ML) 5 ML SYRINGE
INTRAMUSCULAR | Status: DC | PRN
  Administered 2022-05-20: 60 mg via INTRAVENOUS

## 2022-05-20 MED ORDER — FENTANYL CITRATE (PF) 100 MCG/2ML IJ SOLN
INTRAMUSCULAR | Status: AC
  Filled 2022-05-20: qty 2

## 2022-05-20 MED ORDER — PROPOFOL 10 MG/ML IV BOLUS
INTRAVENOUS | Status: AC
  Filled 2022-05-20: qty 20

## 2022-05-20 MED ORDER — ONDANSETRON HCL 4 MG/2ML IJ SOLN
INTRAMUSCULAR | Status: AC
  Filled 2022-05-20: qty 2

## 2022-05-20 MED ORDER — OXYCODONE HCL 5 MG PO TABS
5.0000 mg | ORAL_TABLET | ORAL | Status: DC | PRN
  Administered 2022-05-22 – 2022-05-23 (×3): 5 mg via ORAL
  Filled 2022-05-20 (×3): qty 1

## 2022-05-20 MED ORDER — DIPHENHYDRAMINE HCL 25 MG PO CAPS: 25.0000 mg | ORAL_CAPSULE | Freq: Four times a day (QID) | ORAL | Status: DC | PRN

## 2022-05-20 MED ORDER — ACETAMINOPHEN 160 MG/5ML PO SOLN: 1000.0000 mg | Freq: Once | ORAL | Status: DC | PRN

## 2022-05-20 MED ORDER — ONDANSETRON 4 MG PO TBDP: 4.0000 mg | ORAL_TABLET | Freq: Four times a day (QID) | ORAL | Status: DC | PRN

## 2022-05-20 MED ORDER — HYDROMORPHONE HCL 1 MG/ML IJ SOLN
INTRAMUSCULAR | Status: DC | PRN
  Administered 2022-05-20 (×2): 1 mg via INTRAVENOUS

## 2022-05-20 MED ORDER — MELATONIN 3 MG PO TABS: 3.0000 mg | ORAL_TABLET | Freq: Every evening | ORAL | Status: DC | PRN

## 2022-05-20 MED ORDER — MORPHINE SULFATE 1 MG/ML IV SOLN PCA
INTRAVENOUS | Status: DC
  Filled 2022-05-20: qty 30

## 2022-05-20 SURGICAL SUPPLY — 47 items
ADH SKN CLS APL DERMABOND .7 (GAUZE/BANDAGES/DRESSINGS)
BAG COUNTER SPONGE SURGICOUNT (BAG) IMPLANT
BAG SPNG CNTER NS LX DISP (BAG) ×1
BLADE EXTENDED COATED 6.5IN (ELECTRODE) IMPLANT
BLADE SURG SZ10 CARB STEEL (BLADE) IMPLANT
CLSR STERI-STRIP ANTIMIC 1/2X4 (GAUZE/BANDAGES/DRESSINGS) IMPLANT
COVER SURGICAL LIGHT HANDLE (MISCELLANEOUS) ×1 IMPLANT
DERMABOND ADVANCED .7 DNX12 (GAUZE/BANDAGES/DRESSINGS) IMPLANT
DRAPE SHEET LG 3/4 BI-LAMINATE (DRAPES) IMPLANT
DRAPE WARM FLUID 44X44 (DRAPES) ×1 IMPLANT
DRSG OPSITE POSTOP 4X12 (GAUZE/BANDAGES/DRESSINGS) IMPLANT
ELECT REM PT RETURN 15FT ADLT (MISCELLANEOUS) ×1 IMPLANT
GLOVE BIOGEL PI IND STRL 7.0 (GLOVE) ×1 IMPLANT
GLOVE SURG SS PI 7.0 STRL IVOR (GLOVE) ×2 IMPLANT
GOWN STRL REUS W/ TWL LRG LVL3 (GOWN DISPOSABLE) ×1 IMPLANT
GOWN STRL REUS W/ TWL XL LVL3 (GOWN DISPOSABLE) IMPLANT
GOWN STRL REUS W/TWL LRG LVL3 (GOWN DISPOSABLE) ×1
GOWN STRL REUS W/TWL XL LVL3 (GOWN DISPOSABLE)
IRRIG SUCT STRYKERFLOW 2 WTIP (MISCELLANEOUS)
IRRIGATION SUCT STRKRFLW 2 WTP (MISCELLANEOUS) IMPLANT
KIT BASIN OR (CUSTOM PROCEDURE TRAY) ×1 IMPLANT
KIT TURNOVER KIT A (KITS) IMPLANT
LIGASURE IMPACT 36 18CM CVD LR (INSTRUMENTS) IMPLANT
RELOAD PROXIMATE 75MM BLUE (ENDOMECHANICALS) ×2 IMPLANT
RELOAD STAPLE 75 3.8 BLU REG (ENDOMECHANICALS) IMPLANT
SHEARS FOC LG CVD HARMONIC 17C (MISCELLANEOUS) IMPLANT
SHEARS HARMONIC ACE PLUS 36CM (ENDOMECHANICALS) IMPLANT
SLEEVE Z-THREAD 5X100MM (TROCAR) ×1 IMPLANT
SPIKE FLUID TRANSFER (MISCELLANEOUS) IMPLANT
SPONGE T-LAP 18X18 ~~LOC~~+RFID (SPONGE) IMPLANT
STAPLER PROXIMATE 75MM BLUE (STAPLE) IMPLANT
SUT MNCRL AB 4-0 PS2 18 (SUTURE) ×1 IMPLANT
SUT PDS AB 0 CT1 36 (SUTURE) IMPLANT
SUT PDS AB 3-0 SH 27 (SUTURE) IMPLANT
SUT PROLENE 3 0 SH 48 (SUTURE) IMPLANT
SUT SILK 2 0 (SUTURE) ×1
SUT SILK 2 0 SH CR/8 (SUTURE) IMPLANT
SUT SILK 2-0 18XBRD TIE 12 (SUTURE) IMPLANT
SUT SILK 3 0 (SUTURE) ×1
SUT SILK 3 0 SH CR/8 (SUTURE) IMPLANT
SUT SILK 3-0 18XBRD TIE 12 (SUTURE) IMPLANT
TOWEL OR 17X26 10 PK STRL BLUE (TOWEL DISPOSABLE) ×1 IMPLANT
TOWEL OR NON WOVEN STRL DISP B (DISPOSABLE) ×1 IMPLANT
TRAY FOLEY MTR SLVR 16FR STAT (SET/KITS/TRAYS/PACK) IMPLANT
TRAY LAPAROSCOPIC (CUSTOM PROCEDURE TRAY) ×1 IMPLANT
TROCAR ADV FIXATION 12X100MM (TROCAR) IMPLANT
TROCAR Z-THREAD OPTICAL 5X100M (TROCAR) ×1 IMPLANT

## 2022-05-20 NOTE — Progress Notes (Signed)
Patient's Morphine PCA was declined by patient and Amil Amen RN returned it to pharmacy at pharmacy's request at @ 1825.

## 2022-05-20 NOTE — Anesthesia Procedure Notes (Signed)
Procedure Name: Intubation Date/Time: 05/20/2022 12:27 PM  Performed by: Johnette Abraham, CRNAPre-anesthesia Checklist: Patient identified, Emergency Drugs available, Suction available and Patient being monitored Patient Re-evaluated:Patient Re-evaluated prior to induction Oxygen Delivery Method: Circle System Utilized Preoxygenation: Pre-oxygenation with 100% oxygen Induction Type: IV induction Ventilation: Mask ventilation without difficulty Laryngoscope Size: Mac and 4 Grade View: Grade I Tube type: Oral Tube size: 8.0 mm Number of attempts: 1 Airway Equipment and Method: Stylet and Oral airway Placement Confirmation: ETT inserted through vocal cords under direct vision, positive ETCO2 and breath sounds checked- equal and bilateral Secured at: 23 cm Tube secured with: Tape Dental Injury: Teeth and Oropharynx as per pre-operative assessment

## 2022-05-20 NOTE — Op Note (Signed)
Preoperative diagnosis: gastric cancer  Postoperative diagnosis: same   Procedure: distal gastrectomy  Surgeon: Feliciana Rossetti, M.D.  Asst: Almond Lint  Anesthesia: GETA  Indications for procedure: Jonathan Maynard is a 52 y.o. year old male with gastric cancer who has undergone neoadjuvant therapy with response. He presents today for gastrectomy.  Description of procedure: The patient was brought into the operative suite. Anesthesia was administered with General endotracheal anesthesia. WHO checklist was applied. The patient was then placed in supine position. The area was prepped and draped in the usual sterile fashion.  Next, a left subcostal incision was made. A 5mm trocar was used to gain access to the peritoneal cavity by optical entry technique. Pneumoperitoneum was applied with a high flow and low pressure. The laparoscope was reinserted to confirm position.  The abdomen was inspected.  There were no peritoneal implants, the liver was free of masses, the omentum was flat and yellow.  Decision was made to proceed with open surgery.  An upper midline incision was made.  Cautery was used to dissect down through subcutaneous tissues and fascia was entered midline.  A Bookwalter was put in place.  Next the omentum was freed from the abdominal wall with cautery.  The omentum was then dissected away from the transverse colon.  This allowed the omentum to be completely flipped up and dissection was continued up to the mid stomach on the greater curve.  Attachments of the stomach to the retroperitoneum were freed this allowed mobilization of the lesser curve. The mass was present in the distal stomach just proximal to the pylorus. It was fairly mobile. There were palpable nodes along the porta.   Portion along the proximal stomach was chosen for transection and the lesser curve vessels were divided with LigaSure.  Next, the stomach was divided with 2 firings of a 75 mm GIA blue load  stapler. Next, the colon was completely separated from the distal stomach and duodenum. The gallbladder was separated away from the duodenum and distal stomach. The gastroepiploic was identified. The omentum was separated away from the stomach with ligasure and sent as specimen.  Next, the distal stomach was separated from the pancreas. The vessels and nodes along the stomach were taken and multiple vessels ligated with harmonic scalpel and 3-0 silk ties. The right gastric vessel was identified and taken near the origin with 3-0 prolene. He had a bulky node along the right gastric vessels that was removed with specimen. Next, the proximal duodenum was divided with 75 mm GIA stapler.  Next, the porta was skeletonized by removing multiple lymph nodes with harmonic scalpel. Clips were used to hemostasis.  Next, the left gastric vessels were skeletonized by removing multiple enlarged lymph nodes. Hemostasis was applied with clips and 3-0 silk sutures.  The abdomen was irrigated. Fibrillar was placed over the head of the pancreas and left gastric vessels.  Next, the ligament of trietz was identified. A portion of intestine 50 cm distal was brought up in an antecolic position. The 75 mm GIA was used to create an gastrojejunostomy on the posterior side of the stomach. The enterotomy was closed with running 3-0 PDS Kennel stitch. One 3-0 silk was used to offload tension by suturing the jejunum just distal to the anastomosis to the medial stomach.  The abdomen was irrigated. The fascia was closed with 0 PDS in running fashion. The skin was closed with 4-0 monocryl in running fashion. Bandage was put in place. All count were correct. The patient  awoke and was brought to PACU in stable condition.  Findings: distal gastric mass, enlarged lymph nodes around porta and left gastric vessels  Specimen: omentum, distal stomach, portal lymph nodes, left gastric lymph nodes  Implant: none   Blood loss: 600  ml  Local anesthesia:  10 ml marcaine   Complications: none  Feliciana Rossetti, M.D. General, Bariatric, & Minimally Invasive Surgery [facility]

## 2022-05-20 NOTE — Progress Notes (Signed)
Patient refused need for Morphine PCA at this time.

## 2022-05-20 NOTE — Anesthesia Preprocedure Evaluation (Signed)
Anesthesia Evaluation  Patient identified by MRN, date of birth, ID band Patient awake    Reviewed: Allergy & Precautions, NPO status , Patient's Chart, lab work & pertinent test results  History of Anesthesia Complications Negative for: history of anesthetic complications  Airway Mallampati: III  TM Distance: >3 FB     Dental  (+) Dental Advisory Given, Teeth Intact   Pulmonary neg shortness of breath, neg sleep apnea, neg COPD, neg recent URI, Patient abstained from smoking., former smoker   breath sounds clear to auscultation       Cardiovascular negative cardio ROS  Rhythm:Regular     Neuro/Psych negative neurological ROS  negative psych ROS   GI/Hepatic Neg liver ROS, PUD,GERD  ,,GASTRIC CANCER   Endo/Other  negative endocrine ROS    Renal/GU negative Renal ROS     Musculoskeletal negative musculoskeletal ROS (+)    Abdominal   Peds  Hematology  (+) Blood dyscrasia, anemia Lab Results      Component                Value               Date                      WBC                      11.3 (H)            05/16/2022                HGB                      10.8 (L)            05/16/2022                HCT                      35.6 (L)            05/16/2022                MCV                      96.2                05/16/2022                PLT                      223                 05/16/2022              Anesthesia Other Findings   Reproductive/Obstetrics                              Anesthesia Physical Anesthesia Plan  ASA: 2  Anesthesia Plan: General   Post-op Pain Management: Tylenol PO (pre-op)*, Gabapentin PO (pre-op)*, Toradol IV (intra-op)* and Ketamine IV*   Induction: Intravenous  PONV Risk Score and Plan: 2 and Ondansetron, Dexamethasone and Midazolam  Airway Management Planned: Oral ETT  Additional Equipment: None  Intra-op Plan:   Post-operative Plan:  Extubation in OR  Informed Consent: I have reviewed the patients History and Physical, chart, labs and discussed the procedure  including the risks, benefits and alternatives for the proposed anesthesia with the patient or authorized representative who has indicated his/her understanding and acceptance.     Dental advisory given  Plan Discussed with: CRNA  Anesthesia Plan Comments:          Anesthesia Quick Evaluation

## 2022-05-20 NOTE — H&P (Signed)
Chief Complaint: Discuss partial gastrectomy  History of Present Illness: Jonathan Maynard is a 52 y.o. male who is seen today for gastric cancer.  He is feeling well. He has regained a substantial amount of weight.  Review of Systems: A complete review of systems was obtained from the patient. I have reviewed this information and discussed as appropriate with the patient. See HPI as well for other ROS.  Review of Systems  Constitutional: Negative.  HENT: Negative.  Eyes: Negative.  Respiratory: Negative.  Cardiovascular: Negative.  Gastrointestinal: Negative.  Genitourinary: Negative.  Musculoskeletal: Negative.  Skin: Negative.  Neurological: Negative.  Endo/Heme/Allergies: Negative.  Psychiatric/Behavioral: Negative.    Medical History: Past Medical History:  Diagnosis Date  Anemia  Gastric cancer (CMS-HCC)   There is no problem list on file for this patient.  Past Surgical History:  Procedure Laterality Date  HERNIA REPAIR    No Known Allergies  Current Outpatient Medications on File Prior to Visit  Medication Sig Dispense Refill  ferrous sulfate 325 (65 FE) MG tablet Take by mouth (Patient not taking: Reported on 03/27/2022)  pantoprazole (PROTONIX) 40 MG DR tablet Take 1 tablet by mouth 2 (two) times daily   No current facility-administered medications on file prior to visit.   Family History  Problem Relation Age of Onset  Diabetes Mother    Social History   Tobacco Use  Smoking Status Former  Types: Cigarettes  Smokeless Tobacco Never    Social History   Socioeconomic History  Marital status: Married  Tobacco Use  Smoking status: Former  Types: Cigarettes  Smokeless tobacco: Never  Substance and Sexual Activity  Alcohol use: Defer  Drug use: Defer   Objective:   Vitals:  03/27/22 1343  Pulse: 110  Temp: 36.9 C (98.5 F)  SpO2: 98%  Weight: 86.6 kg (191 lb)  Height: 165.1 cm (5\' 5" )  PainSc: 2   Body mass index is 31.78  kg/m.  Physical Exam Constitutional:  Appearance: Normal appearance.  HENT:  Head: Normocephalic and atraumatic.  Pulmonary:  Effort: Pulmonary effort is normal.  Musculoskeletal:  General: Normal range of motion.  Cervical back: Normal range of motion.  Neurological:  General: No focal deficit present.  Mental Status: He is alert and oriented to person, place, and time. Mental status is at baseline.  Psychiatric:  Mood and Affect: Mood normal.  Behavior: Behavior normal.  Thought Content: Thought content normal.    Labs, Imaging and Diagnostic Testing:  I reviewed PET images, I reviewed IR biopsy results of supraclavicular node that was negative for metastasis.  Assessment and Plan:   Diagnoses and all orders for this visit:  Gastric adenocarcinoma (CMS-HCC)  Regional lymph node metastasis present (CMS-HCC)  He appears to be responding to the FLOT. He is set to continue treatments through 4/19. I think he is now a candidate for surgery. We discussed the plan for surgery for distal gastrectomy with gastrojejunostomy construction, nodal harvest and j-tube insertion. We discussed approximately 7 day hospital admission with possible complications of more advanced cancer at time of surgery that would result in not proceeding with resection, bleeding, injury to surrounding organs, ileus, afferent and efferent limb syndromes, delayed emptying, nausea, infection, ulcers and connection stenosis. He showed good understanding and wanted to proceed.

## 2022-05-20 NOTE — Transfer of Care (Signed)
Immediate Anesthesia Transfer of Care Note  Patient: Jonathan Maynard  Procedure(s) Performed: OPEN PARTIAL GASTRECTOMY WITH GASTROJEJUNAL ANASTOMOSIS LAPAROSCOPY DIAGNOSTIC  Patient Location: PACU  Anesthesia Type:General  Level of Consciousness: awake, alert , oriented, and patient cooperative  Airway & Oxygen Therapy: Patient Spontanous Breathing and Patient connected to face mask oxygen  Post-op Assessment: Report given to RN, Post -op Vital signs reviewed and stable, and Patient moving all extremities  Post vital signs: Reviewed and stable  Last Vitals:  Vitals Value Taken Time  BP 151/101 05/20/22 1530  Temp    Pulse 87 05/20/22 1533  Resp 13 05/20/22 1533  SpO2 100 % 05/20/22 1533  Vitals shown include unvalidated device data.  Last Pain:  Vitals:   05/20/22 1039  TempSrc: Oral         Complications: No notable events documented.

## 2022-05-21 ENCOUNTER — Encounter (HOSPITAL_COMMUNITY): Payer: Self-pay | Admitting: General Surgery

## 2022-05-21 LAB — PHOSPHORUS: Phosphorus: 3.3 mg/dL (ref 2.5–4.6)

## 2022-05-21 LAB — BASIC METABOLIC PANEL
Anion gap: 7 (ref 5–15)
BUN: 9 mg/dL (ref 6–20)
CO2: 24 mmol/L (ref 22–32)
Calcium: 8.6 mg/dL — ABNORMAL LOW (ref 8.9–10.3)
Chloride: 105 mmol/L (ref 98–111)
Creatinine, Ser: 0.83 mg/dL (ref 0.61–1.24)
GFR, Estimated: >60 mL/min (ref >60)
Glucose, Bld: 144 mg/dL — ABNORMAL HIGH (ref 70–99)
Potassium: 4.7 mmol/L (ref 3.5–5.1)
Sodium: 136 mmol/L (ref 135–145)

## 2022-05-21 LAB — CBC
HCT: 28.5 % — ABNORMAL LOW (ref 39.0–52.0)
Hemoglobin: 8.7 g/dL — ABNORMAL LOW (ref 13.0–17.0)
MCH: 29.7 pg (ref 26.0–34.0)
MCHC: 30.5 g/dL (ref 30.0–36.0)
MCV: 97.3 fL (ref 80.0–100.0)
Platelets: 174 10*3/uL (ref 150–400)
RBC: 2.93 MIL/uL — ABNORMAL LOW (ref 4.22–5.81)
RDW: 18.3 % — ABNORMAL HIGH (ref 11.5–15.5)
WBC: 25.3 10*3/uL — ABNORMAL HIGH (ref 4.0–10.5)
nRBC: 0 % (ref 0.0–0.2)

## 2022-05-21 LAB — MAGNESIUM: Magnesium: 1.5 mg/dL — ABNORMAL LOW (ref 1.7–2.4)

## 2022-05-21 MED ORDER — MAGNESIUM SULFATE 4 GM/100ML IV SOLN
4.0000 g | Freq: Once | INTRAVENOUS | Status: AC
  Administered 2022-05-21: 4 g via INTRAVENOUS
  Filled 2022-05-21: qty 100

## 2022-05-21 NOTE — Progress Notes (Signed)
Male patient 52 year old with diagnosis of gastric cancer. Patient is alert and oriented times four. Patient states pain is a 2 on a scale on a 1-10 scale. Patients heart beats are in normal rhythm and free of murmur. Lungs clear bilaterally with no evidence of crackles or wheezes. Patients NG tube was removed this morning, waiting on doctors orders for further instructions.       Student RN, Waldo Laine

## 2022-05-21 NOTE — Progress Notes (Signed)
  1 Day Post-Op   Chief Complaint/Subjective: Pain controlled, +BM, no nausea  Objective: Vital signs in last 24 hours: Temp:  [97.7 F (36.5 C)-98.6 F (37 C)] 98.6 F (37 C) (05/15 0513) Pulse Rate:  [81-93] 86 (05/15 0513) Resp:  [12-18] 18 (05/15 0513) BP: (117-157)/(80-103) 125/90 (05/15 0513) SpO2:  [95 %-100 %] 99 % (05/15 0513) Weight:  [83.2 kg] 83.2 kg (05/14 1036) Last BM Date : 05/20/22 Intake/Output from previous day: 05/14 0701 - 05/15 0700 In: 4300 [I.V.:3700; IV Piggyback:600] Out: 3325 [Urine:2725; Blood:600]  PE: Gen: NAD Resp: nonlabored Card: RRR Abd: soft, incision c/d/i  Lab Results:  Recent Labs    05/20/22 1623 05/21/22 0445  WBC 16.1* 25.3*  HGB 8.8* 8.7*  HCT 28.7* 28.5*  PLT 174 174   Recent Labs    05/20/22 1623 05/21/22 0445  NA 140 136  K 3.6 4.7  CL 107 105  CO2 26 24  GLUCOSE 115* 144*  BUN 9 9  CREATININE 0.94 0.83  CALCIUM 8.4* 8.6*   No results for input(s): "LABPROT", "INR" in the last 72 hours.    Component Value Date/Time   NA 136 05/21/2022 0445   K 4.7 05/21/2022 0445   CL 105 05/21/2022 0445   CO2 24 05/21/2022 0445   GLUCOSE 144 (H) 05/21/2022 0445   BUN 9 05/21/2022 0445   CREATININE 0.83 05/21/2022 0445   CREATININE 0.89 04/25/2022 0757   CALCIUM 8.6 (L) 05/21/2022 0445   PROT 6.6 05/16/2022 0842   ALBUMIN 3.6 05/16/2022 0842   AST 17 05/16/2022 0842   AST 20 04/25/2022 0757   ALT 18 05/16/2022 0842   ALT 18 04/25/2022 0757   ALKPHOS 62 05/16/2022 0842   BILITOT 0.5 05/16/2022 0842   BILITOT 0.4 04/25/2022 0757   GFRNONAA >60 05/21/2022 0445   GFRNONAA >60 04/25/2022 0757    Assessment/Plan  s/p Procedure(s): OPEN PARTIAL GASTRECTOMY WITH GASTROJEJUNAL ANASTOMOSIS LAPAROSCOPY DIAGNOSTIC 05/20/2022   Hypomagnesemia - replace mag sulfate  FEN - ice chips, remove NG tube VTE - lovenox ID - periop abx Disposition - inpatient, dc tele and continuous pulse ox and PCA   LOS: 1 day   I  reviewed last 24 h vitals and pain scores, last 48 h intake and output, last 24 h labs and trends, and last 24 h imaging results.  This care required high  level of medical decision making.   De Blanch [facility] Surgery at [facility] 05/21/2022, 8:32 AM Please see Amion for pager number during day hours 7:00am-4:30pm or 7:00am -11:30am on weekends

## 2022-05-21 NOTE — TOC CM/SW Note (Signed)
Transition of Care [facility]) Screening Note  Patient Details  Name: Jonathan Maynard Date of Birth: [birth year]  Transition of Care [facility]) CM/SW Contact:    Ewing Schlein, LCSW Phone Number: 05/21/2022, 9:45 AM  Transition of Care Department [facility]) has reviewed patient and no TOC needs have been identified at this time. We will continue to monitor patient advancement through interdisciplinary progression rounds. If new patient transition needs arise, please place a TOC consult.

## 2022-05-22 ENCOUNTER — Telehealth: Payer: Self-pay | Admitting: Hematology

## 2022-05-22 LAB — CBC
HCT: 22.5 % — ABNORMAL LOW (ref 39.0–52.0)
Hemoglobin: 6.9 g/dL — CL (ref 13.0–17.0)
MCH: 29.7 pg (ref 26.0–34.0)
MCHC: 30.7 g/dL (ref 30.0–36.0)
MCV: 97 fL (ref 80.0–100.0)
Platelets: 162 10*3/uL (ref 150–400)
RBC: 2.32 MIL/uL — ABNORMAL LOW (ref 4.22–5.81)
RDW: 18.3 % — ABNORMAL HIGH (ref 11.5–15.5)
WBC: 13 10*3/uL — ABNORMAL HIGH (ref 4.0–10.5)
nRBC: 0 % (ref 0.0–0.2)

## 2022-05-22 LAB — TYPE AND SCREEN
ABO/RH(D): O POS
Unit division: 0

## 2022-05-22 LAB — BPAM RBC
Blood Product Expiration Date: 202406162359
ISSUE DATE / TIME: 202405160933

## 2022-05-22 LAB — PREPARE RBC (CROSSMATCH)

## 2022-05-22 MED ORDER — BOOST / RESOURCE BREEZE PO LIQD CUSTOM
1.0000 | Freq: Three times a day (TID) | ORAL | Status: DC
  Administered 2022-05-22 (×2): 1 via ORAL

## 2022-05-22 MED ORDER — SODIUM CHLORIDE 0.9% IV SOLUTION: Freq: Once | INTRAVENOUS | Status: AC

## 2022-05-22 NOTE — Progress Notes (Signed)
Date and time results received: 05/22/22 0520  Test: Hgb  Critical Value: 6.9  Name of Provider Notified: Dr. Romie Levee  Orders Received? Or Actions Taken?  No new orders at this time.

## 2022-05-22 NOTE — Telephone Encounter (Signed)
Contacted patient to scheduled appointments. Patient is aware of appointments that are scheduled.   

## 2022-05-22 NOTE — Progress Notes (Signed)
  2 Days Post-Op   Chief Complaint/Subjective: Tolerating ice well, abdominal pain controlled, ambulating yesterday  Objective: Vital signs in last 24 hours: Temp:  [98.3 F (36.8 C)-98.8 F (37.1 C)] 98.8 F (37.1 C) (05/16 0511) Pulse Rate:  [78-87] 78 (05/16 0511) Resp:  [16-18] 18 (05/16 0511) BP: (116-136)/(73-90) 118/73 (05/16 0511) SpO2:  [91 %-100 %] 96 % (05/16 0511) Last BM Date : 05/20/22 Intake/Output from previous day: 05/15 0701 - 05/16 0700 In: 2230.4 [I.V.:2130.4; IV Piggyback:100] Out: 3150 [Urine:3150]  PE: Gen: NAD Resp: nonlabored Card: RRR Abd: incisions c/d/I, nondistended  Lab Results:  Recent Labs    05/21/22 0445 05/22/22 0438  WBC 25.3* 13.0*  HGB 8.7* 6.9*  HCT 28.5* 22.5*  PLT 174 162   Recent Labs    05/20/22 1623 05/21/22 0445  NA 140 136  K 3.6 4.7  CL 107 105  CO2 26 24  GLUCOSE 115* 144*  BUN 9 9  CREATININE 0.94 0.83  CALCIUM 8.4* 8.6*   No results for input(s): "LABPROT", "INR" in the last 72 hours.    Component Value Date/Time   NA 136 05/21/2022 0445   K 4.7 05/21/2022 0445   CL 105 05/21/2022 0445   CO2 24 05/21/2022 0445   GLUCOSE 144 (H) 05/21/2022 0445   BUN 9 05/21/2022 0445   CREATININE 0.83 05/21/2022 0445   CREATININE 0.89 04/25/2022 0757   CALCIUM 8.6 (L) 05/21/2022 0445   PROT 6.6 05/16/2022 0842   ALBUMIN 3.6 05/16/2022 0842   AST 17 05/16/2022 0842   AST 20 04/25/2022 0757   ALT 18 05/16/2022 0842   ALT 18 04/25/2022 0757   ALKPHOS 62 05/16/2022 0842   BILITOT 0.5 05/16/2022 0842   BILITOT 0.4 04/25/2022 0757   GFRNONAA >60 05/21/2022 0445   GFRNONAA >60 04/25/2022 0757    Assessment/Plan  s/p Procedure(s): OPEN PARTIAL GASTRECTOMY WITH GASTROJEJUNAL ANASTOMOSIS LAPAROSCOPY DIAGNOSTIC 05/20/2022   FEN - clear liquids, breeze VTE - lovenox,  ABLA - give 1 pRBC for downtrending Hgb 6.9 from 8.6 with significant blood loss at time of surgery ID - no issues Disposition - inpatient   LOS:  2 days   I reviewed last 24 h vitals and pain scores, last 48 h intake and output, last 24 h labs and trends, and last 24 h imaging results.  This care required high  level of medical decision making.   De Blanch [facility] Surgery at [facility] 05/22/2022, 8:53 AM Please see Amion for pager number during day hours 7:00am-4:30pm or 7:00am -11:30am on weekends

## 2022-05-23 ENCOUNTER — Encounter: Payer: Self-pay | Admitting: Physician Assistant

## 2022-05-23 ENCOUNTER — Encounter: Payer: Self-pay | Admitting: Hematology

## 2022-05-23 ENCOUNTER — Other Ambulatory Visit (HOSPITAL_COMMUNITY): Payer: Self-pay

## 2022-05-23 LAB — SURGICAL PATHOLOGY

## 2022-05-23 LAB — TYPE AND SCREEN: Antibody Screen: NEGATIVE

## 2022-05-23 LAB — CBC
HCT: 26.5 % — ABNORMAL LOW (ref 39.0–52.0)
Hemoglobin: 8.5 g/dL — ABNORMAL LOW (ref 13.0–17.0)
MCH: 30.7 pg (ref 26.0–34.0)
MCHC: 32.1 g/dL (ref 30.0–36.0)
MCV: 95.7 fL (ref 80.0–100.0)
Platelets: 198 10*3/uL (ref 150–400)
RBC: 2.77 MIL/uL — ABNORMAL LOW (ref 4.22–5.81)
RDW: 17.4 % — ABNORMAL HIGH (ref 11.5–15.5)
WBC: 13.1 10*3/uL — ABNORMAL HIGH (ref 4.0–10.5)
nRBC: 0 % (ref 0.0–0.2)

## 2022-05-23 LAB — BPAM RBC: Unit Type and Rh: 5100

## 2022-05-23 MED ORDER — ENSURE ENLIVE PO LIQD
237.0000 mL | Freq: Two times a day (BID) | ORAL | Status: DC
  Administered 2022-05-23 (×2): 237 mL via ORAL

## 2022-05-23 MED ORDER — OXYCODONE HCL 5 MG PO TABS
5.0000 mg | ORAL_TABLET | Freq: Four times a day (QID) | ORAL | 0 refills | Status: DC | PRN
Start: 2022-05-23 — End: 2022-07-18
  Filled 2022-05-23: qty 15, 4d supply, fill #0

## 2022-05-23 MED ORDER — ONDANSETRON 4 MG PO TBDP
4.0000 mg | ORAL_TABLET | Freq: Four times a day (QID) | ORAL | 0 refills | Status: DC | PRN
  Filled 2022-05-23: qty 20, 5d supply, fill #0

## 2022-05-23 NOTE — Anesthesia Postprocedure Evaluation (Signed)
Anesthesia Post Note  Patient: Ayren Stalnaker  Procedure(s) Performed: OPEN PARTIAL GASTRECTOMY WITH GASTROJEJUNAL ANASTOMOSIS LAPAROSCOPY DIAGNOSTIC     Patient location during evaluation: PACU Anesthesia Type: General Level of consciousness: awake and alert Pain management: pain level controlled Vital Signs Assessment: post-procedure vital signs reviewed and stable Respiratory status: spontaneous breathing, nonlabored ventilation, respiratory function stable and patient connected to nasal cannula oxygen Cardiovascular status: blood pressure returned to baseline and stable Postop Assessment: no apparent nausea or vomiting Anesthetic complications: no   No notable events documented.  Last Vitals:  Vitals:   05/23/22 0953 05/23/22 1430  BP: 120/85 (!) 127/90  Pulse: (!) 102 96  Resp: 14 15  Temp: 37.1 C 36.9 C  SpO2: 100% 95%    Last Pain:  Vitals:   05/23/22 1430  TempSrc: Oral  PainSc:                  Ezariah Dartanion Teo

## 2022-05-23 NOTE — Progress Notes (Signed)
  3 Days Post-Op   Chief Complaint/Subjective: Tolerating liquids well, ambulating with walker, +flatus  Objective: Vital signs in last 24 hours: Temp:  [98.5 F (36.9 C)-98.8 F (37.1 C)] 98.6 F (37 C) (05/17 0550) Pulse Rate:  [77-97] 97 (05/17 0550) Resp:  [17-18] 18 (05/17 0550) BP: (102-117)/(64-77) 113/77 (05/17 0550) SpO2:  [97 %-100 %] 100 % (05/17 0550) Last BM Date : 05/20/22 Intake/Output from previous day: 05/16 0701 - 05/17 0700 In: 1824 [P.O.:1200; I.V.:250; Blood:374] Out: 1700 [Urine:1700]  PE: Gen: NAd Resp: nonlabored Card: RRR Abd: soft, incision c/d/i  Lab Results:  Recent Labs    05/22/22 0438 05/23/22 0445  WBC 13.0* 13.1*  HGB 6.9* 8.5*  HCT 22.5* 26.5*  PLT 162 198   Recent Labs    05/20/22 1623 05/21/22 0445  NA 140 136  K 3.6 4.7  CL 107 105  CO2 26 24  GLUCOSE 115* 144*  BUN 9 9  CREATININE 0.94 0.83  CALCIUM 8.4* 8.6*   No results for input(s): "LABPROT", "INR" in the last 72 hours.    Component Value Date/Time   NA 136 05/21/2022 0445   K 4.7 05/21/2022 0445   CL 105 05/21/2022 0445   CO2 24 05/21/2022 0445   GLUCOSE 144 (H) 05/21/2022 0445   BUN 9 05/21/2022 0445   CREATININE 0.83 05/21/2022 0445   CREATININE 0.89 04/25/2022 0757   CALCIUM 8.6 (L) 05/21/2022 0445   PROT 6.6 05/16/2022 0842   ALBUMIN 3.6 05/16/2022 0842   AST 17 05/16/2022 0842   AST 20 04/25/2022 0757   ALT 18 05/16/2022 0842   ALT 18 04/25/2022 0757   ALKPHOS 62 05/16/2022 0842   BILITOT 0.5 05/16/2022 0842   BILITOT 0.4 04/25/2022 0757   GFRNONAA >60 05/21/2022 0445   GFRNONAA >60 04/25/2022 0757    Assessment/Plan  s/p Procedure(s): OPEN PARTIAL GASTRECTOMY WITH GASTROJEJUNAL ANASTOMOSIS LAPAROSCOPY DIAGNOSTIC 05/20/2022    FEN - reg diet VTE - lovenox, appropriate rise after blood transfusion ID - no issues Disposition - advancing diet, home over the weekend   LOS: 3 days   I reviewed last 24 h vitals and pain scores, last 48 h  intake and output, last 24 h labs and trends, and last 24 h imaging results.  This care required moderate level of medical decision making.   De Blanch [facility] Surgery at [facility] 05/23/2022, 7:29 AM Please see Amion for pager number during day hours 7:00am-4:30pm or 7:00am -11:30am on weekends

## 2022-05-24 ENCOUNTER — Other Ambulatory Visit (HOSPITAL_COMMUNITY): Payer: Self-pay

## 2022-05-24 LAB — CBC
HCT: 27.7 % — ABNORMAL LOW (ref 39.0–52.0)
Hemoglobin: 8.7 g/dL — ABNORMAL LOW (ref 13.0–17.0)
MCH: 30.3 pg (ref 26.0–34.0)
MCHC: 31.4 g/dL (ref 30.0–36.0)
MCV: 96.5 fL (ref 80.0–100.0)
Platelets: 259 10*3/uL (ref 150–400)
RBC: 2.87 MIL/uL — ABNORMAL LOW (ref 4.22–5.81)
RDW: 17 % — ABNORMAL HIGH (ref 11.5–15.5)
WBC: 11 10*3/uL — ABNORMAL HIGH (ref 4.0–10.5)
nRBC: 0 % (ref 0.0–0.2)

## 2022-05-24 NOTE — Plan of Care (Signed)

## 2022-05-24 NOTE — Progress Notes (Signed)
Assessment unchanged. Pt verbalized understanding of dc instructions including medications, follow up care and when to call the doctor. Pt discharged via wc to front entrance accompanied by wife and NT.

## 2022-05-24 NOTE — Discharge Instructions (Signed)
ABDOMINAL SURGERY: POST OP INSTRUCTIONS  DIET: Follow a light bland diet the first 24 hours after arrival home, such as soup, liquids, crackers, etc.  Be sure to include lots of fluids daily.  Avoid fast food or heavy meals as your are more likely to get nauseated.  Eat a low fat the next few days after surgery.    Take your usually prescribed home medications unless otherwise directed.  PAIN CONTROL: Pain is best controlled by a usual combination of three different methods TOGETHER: Ice/Heat Over the counter pain medication Prescription pain medication Most patients will experience some swelling and bruising around the incisions.  Ice packs or heating pads (30-60 minutes up to 6 times a day) will help. Use ice for the first few days to help decrease swelling and bruising, then switch to heat to help relax tight/sore spots and speed recovery.  Some people prefer to use ice alone, heat alone, alternating between ice & heat.  Experiment to what works for you.  Swelling and bruising can take several weeks to resolve.   It is helpful to take an over-the-counter pain medication regularly for the first few weeks.  Choose one of the following that works best for you: Naproxen (Aleve, etc)  Two 220mg tabs twice a day Ibuprofen (Advil, etc) Three 200mg tabs four times a day (every meal & bedtime) Acetaminophen (Tylenol, etc) 500-650mg four times a day (every meal & bedtime) A  prescription for pain medication (such as oxycodone, hydrocodone, etc) should be given to you upon discharge.  Take your pain medication as prescribed.  If you are having problems/concerns with the prescription medicine (does not control pain, nausea, vomiting, rash, itching, etc), please call us [phone] to see if we need to switch you to a different pain medicine that will work better for you and/or control your side effect better. If you need a refill on your pain medication, please contact your pharmacy.  They will contact  our office to request authorization. Prescriptions will not be filled after 5 pm or on week-ends.  Avoid getting constipated.  Between the surgery and the pain medications, it is common to experience some constipation.  Increasing fluid intake and taking a fiber supplement (such as Metamucil, Citrucel, FiberCon, MiraLax, etc) 1-2 times a day regularly will usually help prevent this problem from occurring.  A mild laxative (prune juice, Milk of Magnesia, MiraLax, etc) should be taken according to package directions if there are no bowel movements after 48 hours.   Watch out for diarrhea.  If you have many loose bowel movements, simplify your diet to bland foods & liquids for a few days.  Stop any stool softeners and decrease your fiber supplement.  Switching to mild anti-diarrheal medications (Loperamide/Imodium, Kayopectate, Pepto Bismol) can help.  If this worsens or does not improve, please call us.  Wash / shower every day.  You may shower over the incision / wound.  Avoid baths until the skin is fully healed.  Continue to shower over incision(s) after the dressing is off. Remove your waterproof bandages 5 days after surgery.  You may leave the incision open to air.  Remove any wicks or ribbons in your wound.  If you have an open wound, please see wound care instructions. You may replace a dressing/Band-Aid to cover the incision for comfort if you wish.  ACTIVITIES as tolerated:   You may resume regular (light) daily activities beginning the next day--such as daily self-care, walking, climbing stairs--gradually increasing activities as   tolerated.  If you can walk 30 minutes without difficulty, it is safe to try more intense activity such as jogging, treadmill, bicycling, low-impact aerobics, swimming, etc. Save the most intensive and strenuous activity for last such as sit-ups, heavy lifting, contact sports, etc  Refrain from any heavy lifting or straining until you are off narcotics for pain control.    DO NOT PUSH THROUGH PAIN.  Let pain be your guide: If it hurts to do something, don't do it.  Pain is your body warning you to avoid that activity for another week until the pain goes down. You may drive when you are no longer taking prescription pain medication, you can comfortably wear a seatbelt, and you can safely maneuver your car and apply brakes. You may have sexual intercourse when it is comfortable.   FOLLOW UP in our office Please call [facility] at [phone] to set up an appointment to see your surgeon in the office for a follow-up appointment approximately 1-2 weeks after your surgery. Make sure that you call for this appointment the day you arrive home to insure a convenient appointment time. 10. IF YOU HAVE DISABILITY OR FAMILY LEAVE FORMS, BRING THEM TO THE OFFICE FOR PROCESSING.  DO NOT GIVE THEM TO YOUR DOCTOR.   WHEN TO CALL US [phone]: Poor pain control Reactions / problems with new medications (rash/itching, nausea, etc)  Fever over 101.5 F (38.5 C) Inability to urinate Nausea and/or vomiting Worsening swelling or bruising Continued bleeding from incision. Increased pain, redness, or drainage from the incision  The clinic staff is available to answer your questions during regular business hours (8:30am-5pm).  Please don't hesitate to call and ask to speak to one of our nurses for clinical concerns.   A surgeon from [facility] is always on call at the hospitals   If you have a medical emergency, go to the nearest emergency room or call 911.    [facility] [address], [address], [place], [place]  [address] ? MAIN: [phone] ? TOLL FREE: [phone] ? FAX [phone] [email or web address]   

## 2022-05-24 NOTE — Discharge Summary (Signed)
Physician Discharge Summary  Patient ID: Jonathan Maynard MRN: [id number] DOB/AGE: 08-13-1970 52 y.o.  Admit date: 05/20/2022 Discharge date: 05/24/2022  Admission Diagnoses: Gastric adenocarcinoma  Discharge Diagnoses:  Principal Problem:   Gastric cancer [facility]) Status post distal gastrectomy  Discharged Condition: good  Hospital Course: Patient was admitted secondary to gastric adenocarcinoma.  Patient went distal gastrectomy.  Please see op note for full details. Postoperative patient was transition from a liquid diet to regular diet.  He was tolerating this well.  Patient otherwise had good bowel function.  He also had good pain control.  He was ambulate well on his own.  He was seen stable for discharge and discharged home.  Consults: None  Significant Diagnostic Studies: None  Treatments: surgery: As above  Discharge Exam: Blood pressure 119/83, pulse 86, temperature 98.6 F (37 C), temperature source Oral, resp. rate 17, height 5\' 5"  (1.651 m), weight 83.2 kg, SpO2 100 %. General appearance: alert and cooperative GI: soft, non-tender; bowel sounds normal; no masses,  no organomegaly and incision clean dry and intact.  Disposition: Discharge disposition: 01-Home or Self Care       Discharge Instructions     Call MD for:  persistant nausea and vomiting   Complete by: As directed    Call MD for:  redness, tenderness, or signs of infection (pain, swelling, redness, odor or Zaffino/yellow discharge around incision site)   Complete by: As directed    Call MD for:  severe uncontrolled pain   Complete by: As directed    Call MD for:  temperature >100.4   Complete by: As directed    Diet - low sodium heart healthy   Complete by: As directed    Diet - low sodium heart healthy   Complete by: As directed    Discharge wound care:   Complete by: As directed    Remove dressing, steristrips will stay on an additional week or so. Ok to shower   Increase activity slowly    Complete by: As directed    Increase activity slowly   Complete by: As directed       Allergies as of 05/24/2022   No Known Allergies      Medication List     STOP taking these medications    dexamethasone 4 MG tablet Commonly known as: DECADRON   lidocaine-prilocaine cream Commonly known as: EMLA   magic mouthwash (multi-ingredient) oral suspension   magic mouthwash Soln   ondansetron 8 MG tablet Commonly known as: Zofran       TAKE these medications    acetaminophen 500 MG tablet Commonly known as: TYLENOL Take 500 mg by mouth 2 (two) times daily.   FeroSul 325 (65 FE) MG tablet Generic drug: ferrous sulfate Take 1 tablet (325 mg total) by mouth daily.   magic mouthwash (lidocaine, diphenhydrAMINE, alum & mag hydroxide) suspension Swish and spit 15 mLs 3 (three) times daily. What changed:  when to take this reasons to take this   ondansetron 4 MG disintegrating tablet Commonly known as: ZOFRAN-ODT Dissolve 1 tablet (4 mg total) by mouth every 6 (six) hours as needed for up to 20 doses for nausea or vomiting.   oxyCODONE 5 MG immediate release tablet Commonly known as: Oxy IR/ROXICODONE Take 1 tablet (5 mg total) by mouth every 6 (six) hours as needed for severe pain.   pantoprazole 40 MG tablet Commonly known as: PROTONIX Take 1 tablet (40 mg) by mouth 2 times daily.   prochlorperazine 10  MG tablet Commonly known as: COMPAZINE Take 1 tablet (10 mg total) by mouth every 6 (six) hours as needed for nausea or vomiting.               Discharge Care Instructions  (From admission, onward)           Start     Ordered   05/23/22 0000  Discharge wound care:       Comments: Remove dressing, steristrips will stay on an additional week or so. Ok to shower   05/23/22 0728             Signed: Axel Filler 05/24/2022, 8:13 AM

## 2022-05-25 ENCOUNTER — Other Ambulatory Visit: Payer: Self-pay

## 2022-05-26 ENCOUNTER — Telehealth: Payer: Self-pay | Admitting: *Deleted

## 2022-05-26 ENCOUNTER — Encounter: Payer: Self-pay | Admitting: *Deleted

## 2022-05-26 NOTE — Transitions of Care (Post Inpatient/ED Visit) (Signed)
05/26/2022  Name: Jonathan Maynard MRN: [id number] DOB: 12-Feb-1970  Today's TOC FU Call Status: Today's TOC FU Call Status:: Successful TOC FU Call Competed TOC FU Call Complete Date: 05/26/22  Transition Care Management Follow-up Telephone Call Date of Discharge: 05/24/22 Discharge Facility: [facility]) Type of Discharge: Inpatient Admission Primary Inpatient Discharge Diagnosis:: Gastric CA- gastrectomy How have you been since you were released from the hospital?: Better (per spouse: "He is doing very well; seems more like himself every day.  We have an appointment to see the surgeon like they told us in mid- June.  I will start working on getting him a PCP- thanks for the information") Any questions or concerns?: No  Items Reviewed: Did you receive and understand the discharge instructions provided?: Yes (thoroughly reviewed with patient who verbalizes good understanding of same) Medications obtained,verified, and reconciled?: Yes (Medications Reviewed) (Full medication reconciliation/ review completed; no concerns or discrepancies identified; confirmed patient obtained/ is taking all newly Rx'd medications as instructed; self-manages medications and denies questions/ concerns around medications today) Any new allergies since your discharge?: No Dietary orders reviewed?: Yes Type of Diet Ordered:: "liquid to soft, bland" Do you have support at home?: Yes People in Home: spouse Name of Support/Comfort Primary Source: Reports independent in self-care activities; supportive spouse assists as/ if needed/ indicated  Medications Reviewed Today: Medications Reviewed Today     Reviewed by Michaela Corner, RN (Registered Nurse) on 05/26/22 at 1626  Med List Status: <None>   Medication Order Taking? Sig Documenting Provider Last Dose Status Informant  acetaminophen (TYLENOL) 500 MG tablet [id number]  Take 500 mg by mouth 2 (two) times daily. [provider]  Active Self   ferrous sulfate 325 (65 FE) MG tablet [id number]  Take 1 tablet (325 mg total) by mouth daily.  Patient not taking: Reported on 05/15/2022   Zigmund Daniel., MD  Expired 05/15/22 2359 Self  magic mouthwash (lidocaine, diphenhydrAMINE, alum & mag hydroxide) suspension [id number]  Swish and spit 15 mLs 3 (three) times daily.  Patient taking differently: Swish and spit 15 mLs 3 (three) times daily as needed for mouth pain.   Palumbo, April, MD  Active Self  ondansetron (ZOFRAN-ODT) 4 MG disintegrating tablet [id number]  Dissolve 1 tablet (4 mg total) by mouth every 6 (six) hours as needed for up to 20 doses for nausea or vomiting. Kinsinger, De Blanch, MD  Active   oxyCODONE (OXY IR/ROXICODONE) 5 MG immediate release tablet [id number]  Take 1 tablet (5 mg total) by mouth every 6 (six) hours as needed for severe pain. Kinsinger, De Blanch, MD  Active   pantoprazole (PROTONIX) 40 MG tablet [id number]  Take 1 tablet (40 mg) by mouth 2 times daily.   Active Self  prochlorperazine (COMPAZINE) 10 MG tablet [id number]  Take 1 tablet (10 mg total) by mouth every 6 (six) hours as needed for nausea or vomiting. Malachy Mood, MD  Active Self            Home Care and Equipment/Supplies: Were Home Health Services Ordered?: No Any new equipment or medical supplies ordered?: No  Functional Questionnaire: Do you need assistance with bathing/showering or dressing?: Yes (spouse assists as needed post-recent surgery) Do you need assistance with meal preparation?: Yes (spouse assists as needed post-recent surgery) Do you need assistance with eating?: No Do you have difficulty maintaining continence: No Do you need assistance with getting out of bed/getting out of a chair/moving?: No Do you have difficulty managing or  taking your medications?: Yes (spouse assists as needed post-recent surgery)  Follow up appointments reviewed: PCP Follow-up appointment confirmed?: No MD Provider Line Number:[phone]  Given: Yes (discussed options to obtain PCP; provided education around importance/ role of PCP) Specialist Hospital Follow-up appointment confirmed?: Yes Date of Specialist follow-up appointment?: 06/20/22 (verified this is recommended time frame for follow up per hospital discharging provider notes) Follow-Up Specialty Provider:: surgical provider Do you need transportation to your follow-up appointment?: No Do you understand care options if your condition(s) worsen?: Yes-patient verbalized understanding  SDOH Interventions Today    Flowsheet Row Most Recent Value  SDOH Interventions   Food Insecurity Interventions Intervention Not Indicated  Transportation Interventions Intervention Not Indicated  [normally drives self,  spouse assisting post-recent surgery]      TOC Interventions Today    Flowsheet Row Most Recent Value  TOC Interventions   TOC Interventions Discussed/Reviewed TOC Interventions Discussed      Interventions Today    Flowsheet Row Most Recent Value  Chronic Disease   Chronic disease during today's visit Other  [gastric CA with surgical gastrectomy]  General Interventions   General Interventions Discussed/Reviewed General Interventions Discussed, Doctor Visits  Doctor Visits Discussed/Reviewed Doctor Visits Discussed, Specialist, PCP  PCP/Specialist Visits Compliance with follow-up visit  Education Interventions   Education Provided Provided Education  Provided Verbal Education On Other, When to see the doctor  [discussed options to obtain PCP,  provided education around importance/ role of PCP]  Nutrition Interventions   Nutrition Discussed/Reviewed Nutrition Discussed  Pharmacy Interventions   Pharmacy Dicussed/Reviewed Pharmacy Topics Discussed  [Full medication review with updating medication list in EHR per patient report]      Caryl Pina, RN, BSN, CCRN Alumnus RN CM Care Coordination/ Transition of Care- [facility] Care Management [phone]: direct office

## 2022-06-04 ENCOUNTER — Other Ambulatory Visit: Payer: Self-pay | Admitting: Nurse Practitioner

## 2022-06-04 DIAGNOSIS — C163 Malignant neoplasm of pyloric antrum: Secondary | ICD-10-CM

## 2022-06-05 ENCOUNTER — Other Ambulatory Visit: Payer: Self-pay | Admitting: Nurse Practitioner

## 2022-06-05 ENCOUNTER — Inpatient Hospital Stay: Payer: BC Managed Care – PPO

## 2022-06-05 DIAGNOSIS — R970 Elevated carcinoembryonic antigen [CEA]: Secondary | ICD-10-CM | POA: Diagnosis not present

## 2022-06-05 DIAGNOSIS — Z7952 Long term (current) use of systemic steroids: Secondary | ICD-10-CM | POA: Insufficient documentation

## 2022-06-05 DIAGNOSIS — G629 Polyneuropathy, unspecified: Secondary | ICD-10-CM | POA: Insufficient documentation

## 2022-06-05 DIAGNOSIS — C163 Malignant neoplasm of pyloric antrum: Secondary | ICD-10-CM | POA: Insufficient documentation

## 2022-06-05 DIAGNOSIS — Z8619 Personal history of other infectious and parasitic diseases: Secondary | ICD-10-CM | POA: Insufficient documentation

## 2022-06-05 DIAGNOSIS — Z8711 Personal history of peptic ulcer disease: Secondary | ICD-10-CM | POA: Insufficient documentation

## 2022-06-05 DIAGNOSIS — Z79899 Other long term (current) drug therapy: Secondary | ICD-10-CM | POA: Insufficient documentation

## 2022-06-05 DIAGNOSIS — E278 Other specified disorders of adrenal gland: Secondary | ICD-10-CM | POA: Insufficient documentation

## 2022-06-05 DIAGNOSIS — Z95828 Presence of other vascular implants and grafts: Secondary | ICD-10-CM

## 2022-06-05 LAB — CMP (CANCER CENTER ONLY)
ALT: 7 U/L (ref 0–44)
AST: 13 U/L — ABNORMAL LOW (ref 15–41)
Albumin: 3.5 g/dL (ref 3.5–5.0)
Alkaline Phosphatase: 65 U/L (ref 38–126)
Anion gap: 6 (ref 5–15)
BUN: 5 mg/dL — ABNORMAL LOW (ref 6–20)
CO2: 27 mmol/L (ref 22–32)
Calcium: 8.7 mg/dL — ABNORMAL LOW (ref 8.9–10.3)
Chloride: 107 mmol/L (ref 98–111)
Creatinine: 0.8 mg/dL (ref 0.61–1.24)
GFR, Estimated: >60 mL/min (ref >60)
Glucose, Bld: 86 mg/dL (ref 70–99)
Potassium: 3.4 mmol/L — ABNORMAL LOW (ref 3.5–5.1)
Sodium: 140 mmol/L (ref 135–145)
Total Bilirubin: 0.4 mg/dL (ref 0.3–1.2)
Total Protein: 7 g/dL (ref 6.5–8.1)

## 2022-06-05 LAB — IRON AND IRON BINDING CAPACITY (CC-WL,HP ONLY)
Iron: 31 ug/dL — ABNORMAL LOW (ref 45–182)
Saturation Ratios: 12 % — ABNORMAL LOW (ref 17.9–39.5)
TIBC: 263 ug/dL (ref 250–450)
UIBC: 232 ug/dL (ref 117–376)

## 2022-06-05 LAB — CBC WITH DIFFERENTIAL (CANCER CENTER ONLY)
Abs Immature Granulocytes: 0.02 10*3/uL (ref 0.00–0.07)
Basophils Absolute: 0 10*3/uL (ref 0.0–0.1)
Basophils Relative: 1 %
Eosinophils Absolute: 0.1 10*3/uL (ref 0.0–0.5)
Eosinophils Relative: 1 %
HCT: 26.1 % — ABNORMAL LOW (ref 39.0–52.0)
Hemoglobin: 8.7 g/dL — ABNORMAL LOW (ref 13.0–17.0)
Immature Granulocytes: 0 %
Lymphocytes Relative: 33 %
Lymphs Abs: 2.2 10*3/uL (ref 0.7–4.0)
MCH: 30.4 pg (ref 26.0–34.0)
MCHC: 33.3 g/dL (ref 30.0–36.0)
MCV: 91.3 fL (ref 80.0–100.0)
Monocytes Absolute: 0.7 10*3/uL (ref 0.1–1.0)
Monocytes Relative: 10 %
Neutro Abs: 3.7 10*3/uL (ref 1.7–7.7)
Neutrophils Relative %: 55 %
Platelet Count: 375 10*3/uL (ref 150–400)
RBC: 2.86 MIL/uL — ABNORMAL LOW (ref 4.22–5.81)
RDW: 15.9 % — ABNORMAL HIGH (ref 11.5–15.5)
WBC Count: 6.8 10*3/uL (ref 4.0–10.5)
nRBC: 0 % (ref 0.0–0.2)

## 2022-06-05 LAB — VITAMIN B12: Vitamin B-12: 945 pg/mL — ABNORMAL HIGH (ref 180–914)

## 2022-06-05 MED ORDER — HEPARIN SOD (PORK) LOCK FLUSH 100 UNIT/ML IV SOLN
500.0000 [IU] | Freq: Once | INTRAVENOUS | Status: AC
  Administered 2022-06-05: 500 [IU]

## 2022-06-05 MED ORDER — SODIUM CHLORIDE 0.9% FLUSH
10.0000 mL | Freq: Once | INTRAVENOUS | Status: AC
  Administered 2022-06-05: 10 mL

## 2022-06-06 LAB — FERRITIN: Ferritin: 60 ng/mL (ref 24–336)

## 2022-06-06 LAB — CEA (IN HOUSE-CHCC): CEA (CHCC-In House): 171.73 ng/mL — ABNORMAL HIGH (ref 0.00–5.00)

## 2022-06-09 ENCOUNTER — Other Ambulatory Visit (HOSPITAL_COMMUNITY): Payer: BC Managed Care – PPO

## 2022-06-10 ENCOUNTER — Telehealth: Payer: Self-pay

## 2022-06-10 NOTE — Telephone Encounter (Addendum)
Called patient and relayed message below as per Santiago Glad NP. Patient stated he is feeling much better and he voiced full understanding of results. He asked if he could take the blood band off and I instructed him that he could since he didn't need blood.    ----- Message from Pollyann Samples, NP sent at 06/09/2022  9:27 PM EDT ----- Please call pt to see how he's feeling, review labs which show moderate anemia, no B12 or iron deficiency. His anemia is from recent chemo and surgery and that's likely why he has low energy. Please encourage him to eat/drink, mobilize safely at home, and he can take a mens daily vitamin but he does not need blood, IV iron, or B12 injection. Next appt 6/11.   Thanks, Jonathan Heron NP

## 2022-06-15 NOTE — Progress Notes (Unsigned)
Patient Care Team: Pcp, No as PCP - General Malachy Mood, MD as Consulting Physician (Hematology and Oncology)   CHIEF COMPLAINT: Follow-up gastric cancer  Oncology History Overview Note   Cancer Staging  Gastric cancer [facility]) Staging form: Stomach, AJCC 8th Edition - Clinical stage from 10/18/2021: Stage III (cT3, cN3a, cM0) - Signed by Malachy Mood, MD on 11/10/2021     Gastric cancer (HCC)  10/18/2021 Procedure   EGD performed under the care of Dr. Bosie Clos  Findings:  -A large, ulcerated, partially circumferential (involving two thirds of the lumen circumference) mass with no bleeding and stigmata of recent bleeding was found in the gastric antrum, at the pylorus and in the prepyloric region of the stomach. -Segmental severe inflammation characterized by congestion (edema) and erythema was found in the gastric antrum.   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 Negative for dysplasia.  Negative for helicobacter pylori  Mismatch Repair (MMR) Protein Imunohistochemistry (IHC): IHC Expression Result: MLH1: Preserved nuclear expression. MSH2: Preserved nuclear expression. MSH6: Preserved nuclear expression. PMS2: Preserved nuclear expression. Interpretation: NORMAL   10/31/2021 Initial Diagnosis   Gastric cancer (HCC)   11/01/2021 Tumor Marker   CEA = 1,610.96 (^)   11/06/2021 Procedure   Upper EUS, Dr. Dulce Sellar  Impression: - Normal esophagus. - A small amount of food (residue) in the stomach. - Congested, friable (with contact bleeding) and ulcerated  mucosa in the prepyloric region of the stomach. - Normal examined duodenum. - There was no evidence of significant pathology in the left lobe of the liver. - Many abnormal lymph nodes (over 10) were visualized in the gastrohepatic ligament (level 18), celiac region (level 20), perigastric region, peripancreatic region and aortocaval region. - Wall thickening was seen in the prepyloric region of the stomach. The thickening appeared to be primarily within the deep mucosa (Layer 2) but extended through the muscularis propria. - Overall constellation of findings consistent with at least T3 N3 Mx (at least stage III) gastric adenocarcinoma.   11/08/2021 Imaging   EXAM: CT CHEST, ABDOMEN, AND PELVIS WITH CONTRAST  IMPRESSION: 1. Similar irregular wall thickening of the gastric antrum, consistent with patient's known primary gastric neoplasm. 2. Increased size of the upper abdominal lymph nodes, concerning for worsening nodal disease involvement. 3. Stable left adrenal nodule common nonspecific possibly a metastatic lesion or adenoma. 4. No evidence of metastatic disease in the chest. 5. Left-sided colonic diverticulosis without findings of acute diverticulitis. 6. Enlarged prostate gland. 7.  Aortic Atherosclerosis (ICD10-I70.0).   11/22/2021 -  Chemotherapy   Patient is on Treatment Plan : GASTROESOPHAGEAL FLOT q14d X 4 cycles     11/27/2021 Imaging    IMPRESSION: 1. The mass within the gastric antrum is mildly decreased in size when compared with 11/08/2021. There is persistent FDG uptake associated with this mass compatible with residual tumor. 2. Persistent FDG avid lymph nodes within the gastrohepatic ligament, portacaval region, and mesentery. These are mildly decreased in size when compared with 11/08/2021. 3. Tracer avid left supraclavicular lymph node is also mildly decreased in size when compared with 11/08/2021. 4. Indeterminate left adrenal gland nodule with mild FDG  uptake. This may represent a lipid poor adenoma. Metastatic disease not exclude. 5. Diffuse increased uptake throughout the bone marrow is favored to represent treatment related change. 6.  Aortic Atherosclerosis (ICD10-I70.0).     02/07/2022 Imaging    IMPRESSION: 1. No substantial change in hypermetabolism associated with the distal gastric lesion. Although difficult to discern on noncontrast CT imaging, the soft tissue component appears to be decreased since the prior PET-CT. 2. Interval resolution of the hypermetabolic left supraclavicular lymph node. 3. Interval marked decrease of the hypermetabolic gastrohepatic ligament lymphadenopathy. Similar decrease in size and hypermetabolism associated with index nodes identified previously in the 4. porta hepatis and hypogastric region. 5. Stable left adrenal nodule with slight hypermetabolism. Nonspecific finding could represent lipid poor adenoma or metastatic deposit. 6. No new suspicious hypermetabolic disease on today's study. 7.  Aortic Atherosclerosis (ICD10-I70.0).      CURRENT THERAPY: S/p 12 cycles neoadjuvant FLOT followed by distal gastrectomy by Dr. Alvan Dame 05/20/2022  INTERVAL HISTORY Jonathan Maynard returns for follow-up as scheduled, last seen by Dr. Mosetta Putt 05/19/2022.  He underwent distal gastrectomy the next day, postop course was uneventful and he was discharged home on 05/24/2022.  He messaged [place] about low energy, came in for lab 5/30 which showed moderate anemia Hgb 8.7, ferritin 60, B12 945, and a CEA of 171 (previously 43 --> 27 --> 31 over the course of neoadjuvant chemo). Since then the patient now feels "great," back to his normal. Po intake is fair. He is walking 20 minutes a day. Bowels moving normally. 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 Will see surgery on 6/20. Denies n/v, pain, fever, chills, cough, chest pain, dyspnea, leg edema, or other new/specific complaints.   ROS  All other  systems reviewed and negative   Past Medical History:  Diagnosis Date   Acute upper GI bleed 04/12/2021   Anemia    Cancer (HCC)    stomach   Class 1 obesity 04/12/2021   Diverticulosis 04/12/2021   GERD (gastroesophageal reflux disease)    Hepatic steatosis 04/12/2021   Neuromuscular disorder (HCC)    neuropathy from chemo     Past Surgical History:  Procedure Laterality Date   BIOPSY  04/12/2021   Procedure: BIOPSY;  Surgeon: Charlott Rakes, MD;  Location: [facility];  Service: Gastroenterology;;   ESOPHAGOGASTRODUODENOSCOPY N/A 04/12/2021   Procedure: ESOPHAGOGASTRODUODENOSCOPY (EGD);  Surgeon: Charlott Rakes, MD;  Location: [facility];  Service: Gastroenterology;  Laterality: N/A;   ESOPHAGOGASTRODUODENOSCOPY N/A 11/06/2021   Procedure: ESOPHAGOGASTRODUODENOSCOPY (EGD);  Surgeon: Willis Modena, MD;  Location: [facility];  Service: Gastroenterology;  Laterality: N/A;   GASTRECTOMY N/A 05/20/2022   Procedure: OPEN PARTIAL GASTRECTOMY WITH GASTROJEJUNAL ANASTOMOSIS;  Surgeon: Sheliah Hatch, De Blanch, MD;  Location: [facility];  Service: General;  Laterality: N/A;   HERNIA REPAIR Left    inguinal   IR IMAGING GUIDED PORT INSERTION  11/15/2021   LAPAROSCOPY N/A 05/20/2022   Procedure: LAPAROSCOPY DIAGNOSTIC;  Surgeon: Rodman Pickle, MD;  Location: [facility];  Service: General;  Laterality: N/A;   UPPER ESOPHAGEAL ENDOSCOPIC ULTRASOUND (EUS) Bilateral 11/06/2021   Procedure: UPPER ESOPHAGEAL ENDOSCOPIC ULTRASOUND (EUS);  Surgeon: Willis Modena, MD;  Location: [facility];  Service: Gastroenterology;  Laterality: Bilateral;     Outpatient Encounter Medications as of 06/17/2022  Medication Sig   pantoprazole (PROTONIX) 40 MG tablet Take 1 tablet (40 mg) by mouth 2 times daily.   acetaminophen (TYLENOL) 500 MG tablet Take 500 mg by mouth 2 (two) times daily. (Patient not  taking: Reported on 05/26/2022)   ferrous sulfate 325 (65 FE) MG tablet Take 1 tablet (325 mg total)  by mouth daily. (Patient not taking: Reported on 05/15/2022)   magic mouthwash (lidocaine, diphenhydrAMINE, alum & mag hydroxide) suspension Swish and spit 15 mLs 3 (three) times daily. (Patient not taking: Reported on 05/26/2022)   ondansetron (ZOFRAN-ODT) 4 MG disintegrating tablet Dissolve 1 tablet (4 mg total) by mouth every 6 (six) hours as needed for up to 20 doses for nausea or vomiting. (Patient not taking: Reported on 06/17/2022)   oxyCODONE (OXY IR/ROXICODONE) 5 MG immediate release tablet Take 1 tablet (5 mg total) by mouth every 6 (six) hours as needed for severe pain. (Patient not taking: Reported on 06/17/2022)   prochlorperazine (COMPAZINE) 10 MG tablet Take 1 tablet (10 mg total) by mouth every 6 (six) hours as needed for nausea or vomiting. (Patient not taking: Reported on 05/26/2022)   No facility-administered encounter medications on file as of 06/17/2022.     Today's Vitals   06/17/22 1200 06/17/22 1212  BP:  107/71  Pulse:  81  Resp:  17  Temp:  98.4 F (36.9 C)  TempSrc:  Oral  SpO2:  100%  Weight:  168 lb 8 oz (76.4 kg)  PainSc: 0-No pain    Body mass index is 28.04 kg/m.   PHYSICAL EXAM GENERAL:alert, no distress and comfortable SKIN: no rash  EYES: sclera clear NECK: without mass LYMPH:  no palpable cervical or supraclavicular lymphadenopathy  LUNGS: clear with normal breathing effort HEART: regular rate & rhythm, no lower extremity edema ABDOMEN: abdomen soft, non-tender and normal bowel sounds. Midline incision healed. No erythema, drainage, or nodularity about the scar NEURO: alert & oriented x 3 with fluent speech, no focal motor deficits PAC without erythema    CBC    Component Value Date/Time   WBC 6.5 06/17/2022 1156   WBC 11.0 (H) 05/24/2022 0504   RBC 2.96 (L) 06/17/2022 1156   HGB 9.0 (L) 06/17/2022 1156   HCT 27.1 (L) 06/17/2022 1156   PLT 252 06/17/2022 1156   MCV 91.6 06/17/2022 1156   MCH 30.4 06/17/2022 1156   MCHC 33.2 06/17/2022 1156    RDW 16.1 (H) 06/17/2022 1156   LYMPHSABS 2.0 06/17/2022 1156   MONOABS 0.6 06/17/2022 1156   EOSABS 0.1 06/17/2022 1156   BASOSABS 0.0 06/17/2022 1156     CMP     Component Value Date/Time   NA 141 06/17/2022 1156   K 4.0 06/17/2022 1156   CL 110 06/17/2022 1156   CO2 27 06/17/2022 1156   GLUCOSE 98 06/17/2022 1156   BUN 7 06/17/2022 1156   CREATININE 0.81 06/17/2022 1156   CALCIUM 8.7 (L) 06/17/2022 1156   PROT 6.5 06/17/2022 1156   ALBUMIN 3.4 (L) 06/17/2022 1156   AST 12 (L) 06/17/2022 1156   ALT 6 06/17/2022 1156   ALKPHOS 64 06/17/2022 1156   BILITOT 0.3 06/17/2022 1156   GFRNONAA >60 06/17/2022 1156     ASSESSMENT & PLAN:Jonathan Maynard is a 52 y.o. male with    Gastric cancer, stage III cT3N3Mx, with hypermetabolic left supraclavicular node, and indeterminate adrenal nodule, MMR normal ypT3ypN3a -diagnosed 10/18/21 by EGD for 6 month f/u of gastric ulcers, showed mucosal intramucosal adenocarcinoma. MMR normal. -baseline CEA 11/01/21 significantly elevated at 1,989.58. -EUS on 11/06/21 by Dr. Dulce Sellar, staged as T3 N3 (at least stage III) -staging CT CAP 11/08/21 showed: stable gastric antrum wall thickening; increased size of upper abdominal lymph  nodes; stable left adrenal nodule, 1.9 cm. -PET scan showed FDG avid lymph nodes within the gastrohepatic ligament, portacaval region, and mesentery. Tracer avid left supraclavicular lymph node and indeterminate left adrenal gland nodule with mild FDG uptake. -he underwent [facility] node biopsy on 1/17 which was negative for malignant cells, but no lymphoid tissue seen on biopsy  -repeated PET on 02/07/22 after 3 months neoadjuvant chemo showed resolved left supraclavicular lymph node, and significant improvement in regional lymph nodes and the primary tumor, stable and indeterminate left adrenal nodule.  -CT adrenal protocol showed the left adrenal nodule is likely benign adenoma, no biopsy is needed. -Completed neoadjvant  chemo FLOT s/p 12 cycles 11/22/21 - 04/27/2022 -restaging PET on 05/16/22 showed significantly hypermetabolic activity at the primary gastric cancer in pylorus, and a small hypermetabolic mesenteric lymph node, no other evidence of metastasis. surgeon Dr. Sheliah Hatch aware of PET findings -S/p distal gastrectomy 05/20/22, path showed ypT3N3a with clear margins, 7/18 + LNs, and lymphovascular invasion identified. There was some treatment effect in a few lymph nodes nut not in the primary tumor -He recovered uneventfully and feels very well now 1 month post op. He attributes weight loss to early after surgery and increasing activity. Thinks he has stabilized. I encouraged him to check weight at home and let us know if still losing.  -I reviewed the above pathology with the pt and discussed the case with Dr. Mosetta Putt. Likely no adjuvant chemo will be recommended. -I reviewed the high risk of recurrence, given stage III gastric cancer. CEA was elevated 171 post-op (20-40's on neoadjuvant chemo). Will check today. If still high will repeat imaging. If improved, will begin close surveillance  -Will review his case in GI conference  -If CEA improving and consensus for no adjuvant therapy after GI conference, next lab/flush, f/up in 6 weeks   PLAN: -Surgical path and today's labs reviewed, CEA is pending -Likely no adjuvant chemo, review case in GI conference -If today's CEA still rising, proceed with imaging; if improving, begin close surveillance with lab/fup in 6 weeks -Genetics today -Case discussed with Dr. Mosetta Putt   All questions were answered. The patient knows to call the clinic with any problems, questions or concerns. No barriers to learning were detected. I spent 20 minutes counseling the patient face to face. The total time spent in the appointment was 30 minutes and more than 50% was on counseling, review of test results, and coordination of care.   Santiago Glad, NP-C 06/17/2022

## 2022-06-17 ENCOUNTER — Other Ambulatory Visit: Payer: Self-pay | Admitting: Nurse Practitioner

## 2022-06-17 ENCOUNTER — Inpatient Hospital Stay: Payer: BC Managed Care – PPO | Attending: Physician Assistant | Admitting: Genetic Counselor

## 2022-06-17 ENCOUNTER — Inpatient Hospital Stay (HOSPITAL_BASED_OUTPATIENT_CLINIC_OR_DEPARTMENT_OTHER): Payer: BC Managed Care – PPO | Admitting: Nurse Practitioner

## 2022-06-17 ENCOUNTER — Other Ambulatory Visit: Payer: Self-pay | Admitting: Genetic Counselor

## 2022-06-17 ENCOUNTER — Telehealth: Payer: Self-pay

## 2022-06-17 ENCOUNTER — Inpatient Hospital Stay: Payer: BC Managed Care – PPO

## 2022-06-17 ENCOUNTER — Other Ambulatory Visit: Payer: Self-pay

## 2022-06-17 ENCOUNTER — Encounter: Payer: Self-pay | Admitting: Nurse Practitioner

## 2022-06-17 VITALS — BP 107/71 | HR 81 | Temp 98.4°F | Resp 17 | Wt 168.5 lb

## 2022-06-17 DIAGNOSIS — R970 Elevated carcinoembryonic antigen [CEA]: Secondary | ICD-10-CM | POA: Insufficient documentation

## 2022-06-17 DIAGNOSIS — Z9221 Personal history of antineoplastic chemotherapy: Secondary | ICD-10-CM | POA: Insufficient documentation

## 2022-06-17 DIAGNOSIS — C163 Malignant neoplasm of pyloric antrum: Secondary | ICD-10-CM | POA: Insufficient documentation

## 2022-06-17 DIAGNOSIS — Z1379 Encounter for other screening for genetic and chromosomal anomalies: Secondary | ICD-10-CM

## 2022-06-17 DIAGNOSIS — Z79899 Other long term (current) drug therapy: Secondary | ICD-10-CM | POA: Insufficient documentation

## 2022-06-17 DIAGNOSIS — Z95828 Presence of other vascular implants and grafts: Secondary | ICD-10-CM

## 2022-06-17 LAB — CMP (CANCER CENTER ONLY)
ALT: 6 U/L (ref 0–44)
AST: 12 U/L — ABNORMAL LOW (ref 15–41)
Albumin: 3.4 g/dL — ABNORMAL LOW (ref 3.5–5.0)
Alkaline Phosphatase: 64 U/L (ref 38–126)
Anion gap: 4 — ABNORMAL LOW (ref 5–15)
BUN: 7 mg/dL (ref 6–20)
CO2: 27 mmol/L (ref 22–32)
Calcium: 8.7 mg/dL — ABNORMAL LOW (ref 8.9–10.3)
Chloride: 110 mmol/L (ref 98–111)
Creatinine: 0.81 mg/dL (ref 0.61–1.24)
GFR, Estimated: >60 mL/min (ref >60)
Glucose, Bld: 98 mg/dL (ref 70–99)
Potassium: 4 mmol/L (ref 3.5–5.1)
Sodium: 141 mmol/L (ref 135–145)
Total Bilirubin: 0.3 mg/dL (ref 0.3–1.2)
Total Protein: 6.5 g/dL (ref 6.5–8.1)

## 2022-06-17 LAB — CBC WITH DIFFERENTIAL (CANCER CENTER ONLY)
Abs Immature Granulocytes: 0.01 10*3/uL (ref 0.00–0.07)
Basophils Absolute: 0 10*3/uL (ref 0.0–0.1)
Basophils Relative: 1 %
Eosinophils Absolute: 0.1 10*3/uL (ref 0.0–0.5)
Eosinophils Relative: 1 %
HCT: 27.1 % — ABNORMAL LOW (ref 39.0–52.0)
Hemoglobin: 9 g/dL — ABNORMAL LOW (ref 13.0–17.0)
Immature Granulocytes: 0 %
Lymphocytes Relative: 31 %
Lymphs Abs: 2 10*3/uL (ref 0.7–4.0)
MCH: 30.4 pg (ref 26.0–34.0)
MCHC: 33.2 g/dL (ref 30.0–36.0)
MCV: 91.6 fL (ref 80.0–100.0)
Monocytes Absolute: 0.6 10*3/uL (ref 0.1–1.0)
Monocytes Relative: 9 %
Neutro Abs: 3.7 10*3/uL (ref 1.7–7.7)
Neutrophils Relative %: 58 %
Platelet Count: 252 10*3/uL (ref 150–400)
RBC: 2.96 MIL/uL — ABNORMAL LOW (ref 4.22–5.81)
RDW: 16.1 % — ABNORMAL HIGH (ref 11.5–15.5)
WBC Count: 6.5 10*3/uL (ref 4.0–10.5)
nRBC: 0 % (ref 0.0–0.2)

## 2022-06-17 LAB — CEA (ACCESS): CEA (CHCC): 361.45 ng/mL — ABNORMAL HIGH (ref 0.00–5.00)

## 2022-06-17 LAB — GENETIC SCREENING ORDER

## 2022-06-17 MED ORDER — SODIUM CHLORIDE 0.9% FLUSH
10.0000 mL | Freq: Once | INTRAVENOUS | Status: AC
  Administered 2022-06-17: 10 mL

## 2022-06-17 MED ORDER — HEPARIN SOD (PORK) LOCK FLUSH 100 UNIT/ML IV SOLN
500.0000 [IU] | Freq: Once | INTRAVENOUS | Status: AC
  Administered 2022-06-17: 500 [IU]

## 2022-06-17 NOTE — Progress Notes (Signed)
REFERRING PROVIDER: Malachy Mood, MD [address],  [place] [address]  PRIMARY PROVIDER:  Pcp, No  PRIMARY REASON FOR VISIT:  Encounter Diagnosis  Name Primary?   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 male, was seen for a [place] cancer genetics consultation at the request of Dr. Mosetta Putt due to a personal history of cancer.  Jonathan Maynard presents to clinic today to discuss the possibility of a hereditary predisposition to cancer, to discuss genetic testing, and to further clarify his future cancer risks, as well as potential cancer risks for family members.   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 The tumor showed normal IHC/MMR.   CANCER HISTORY:  Oncology History Overview Note   Cancer Staging  Gastric cancer [facility]) Staging form: Stomach, AJCC 8th Edition - Clinical stage from 10/18/2021: Stage III (cT3, cN3a, cM0) - Signed by Malachy Mood, MD on 11/10/2021     Gastric cancer (HCC)  10/18/2021 Procedure   EGD performed under the care of Dr. Bosie Clos  Findings:  -A large, ulcerated, partially circumferential (involving two thirds of the lumen circumference) mass with no bleeding and stigmata of recent bleeding was found in the gastric antrum, at the pylorus and in the prepyloric region of the stomach. -Segmental severe inflammation characterized by congestion (edema) and erythema was found in the gastric antrum.   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 Negative for dysplasia.  Negative for helicobacter pylori  Mismatch Repair (MMR) Protein Imunohistochemistry (IHC): IHC Expression Result: MLH1: Preserved nuclear expression. MSH2: Preserved nuclear expression. MSH6: Preserved nuclear expression. PMS2: Preserved nuclear expression. Interpretation: NORMAL   10/31/2021 Initial Diagnosis   Gastric cancer (HCC)   11/01/2021 Tumor Marker   CEA = 6,045.40 (^)   11/06/2021 Procedure   Upper EUS, Dr. Dulce Sellar  Impression: - Normal esophagus. - A small amount of food (residue) in the stomach. - Congested, friable (with contact bleeding) and ulcerated mucosa in the prepyloric region of the stomach. - Normal examined duodenum. - There was no evidence of significant pathology in the left lobe of the liver. - Many abnormal lymph nodes (over 10) were visualized in the gastrohepatic ligament (level 18), celiac region (level 20), perigastric region, peripancreatic region and aortocaval region. - Wall thickening was seen in the prepyloric region of the stomach. The thickening appeared to be primarily within the deep mucosa (Layer 2) but extended through the muscularis propria. - Overall constellation of findings consistent with at least T3 N3 Mx (at least stage III) gastric adenocarcinoma.   11/08/2021 Imaging   EXAM: CT CHEST, ABDOMEN, AND PELVIS WITH CONTRAST  IMPRESSION: 1. Similar irregular wall thickening of the gastric antrum, consistent with patient's known primary gastric neoplasm. 2. Increased size of the upper abdominal lymph nodes, concerning for worsening nodal disease involvement. 3. Stable left adrenal nodule common nonspecific possibly a metastatic lesion or adenoma. 4. No evidence of metastatic disease in the chest. 5. Left-sided colonic diverticulosis without findings of acute diverticulitis. 6. Enlarged prostate gland. 7.  Aortic Atherosclerosis (  ICD10-I70.0).   11/22/2021 -   Chemotherapy   Patient is on Treatment Plan : GASTROESOPHAGEAL FLOT q14d X 4 cycles     11/27/2021 Imaging    IMPRESSION: 1. The mass within the gastric antrum is mildly decreased in size when compared with 11/08/2021. There is persistent FDG uptake associated with this mass compatible with residual tumor. 2. Persistent FDG avid lymph nodes within the gastrohepatic ligament, portacaval region, and mesentery. These are mildly decreased in size when compared with 11/08/2021. 3. Tracer avid left supraclavicular lymph node is also mildly decreased in size when compared with 11/08/2021. 4. Indeterminate left adrenal gland nodule with mild FDG uptake. This may represent a lipid poor adenoma. Metastatic disease not exclude. 5. Diffuse increased uptake throughout the bone marrow is favored to represent treatment related change. 6.  Aortic Atherosclerosis (ICD10-I70.0).     02/07/2022 Imaging    IMPRESSION: 1. No substantial change in hypermetabolism associated with the distal gastric lesion. Although difficult to discern on noncontrast CT imaging, the soft tissue component appears to be decreased since the prior PET-CT. 2. Interval resolution of the hypermetabolic left supraclavicular lymph node. 3. Interval marked decrease of the hypermetabolic gastrohepatic ligament lymphadenopathy. Similar decrease in size and hypermetabolism associated with index nodes identified previously in the 4. porta hepatis and hypogastric region. 5. Stable left adrenal nodule with slight hypermetabolism. Nonspecific finding could represent lipid poor adenoma or metastatic deposit. 6. No new suspicious hypermetabolic disease on today's study. 7.  Aortic Atherosclerosis (ICD10-I70.0).     Past Medical History:  Diagnosis Date   Acute upper GI bleed 04/12/2021   Anemia    Cancer (HCC)    stomach   Class 1 obesity 04/12/2021   Diverticulosis 04/12/2021   GERD (gastroesophageal reflux disease)     Hepatic steatosis 04/12/2021   Neuromuscular disorder (HCC)    neuropathy from chemo    Past Surgical History:  Procedure Laterality Date   BIOPSY  04/12/2021   Procedure: BIOPSY;  Surgeon: Charlott Rakes, MD;  Location: [facility];  Service: Gastroenterology;;   ESOPHAGOGASTRODUODENOSCOPY N/A 04/12/2021   Procedure: ESOPHAGOGASTRODUODENOSCOPY (EGD);  Surgeon: Charlott Rakes, MD;  Location: [facility];  Service: Gastroenterology;  Laterality: N/A;   ESOPHAGOGASTRODUODENOSCOPY N/A 11/06/2021   Procedure: ESOPHAGOGASTRODUODENOSCOPY (EGD);  Surgeon: Willis Modena, MD;  Location: [facility];  Service: Gastroenterology;  Laterality: N/A;   GASTRECTOMY N/A 05/20/2022   Procedure: OPEN PARTIAL GASTRECTOMY WITH GASTROJEJUNAL ANASTOMOSIS;  Surgeon: Sheliah Hatch, De Blanch, MD;  Location: [facility];  Service: General;  Laterality: N/A;   HERNIA REPAIR Left    inguinal   IR IMAGING GUIDED PORT INSERTION  11/15/2021   LAPAROSCOPY N/A 05/20/2022   Procedure: LAPAROSCOPY DIAGNOSTIC;  Surgeon: Rodman Pickle, MD;  Location: [facility];  Service: General;  Laterality: N/A;   UPPER ESOPHAGEAL ENDOSCOPIC ULTRASOUND (EUS) Bilateral 11/06/2021   Procedure: UPPER ESOPHAGEAL ENDOSCOPIC ULTRASOUND (EUS);  Surgeon: Willis Modena, MD;  Location: [facility];  Service: Gastroenterology;  Laterality: Bilateral;    Social History   Socioeconomic History   Marital status: Married    Spouse name: Not on file   Number of children: Not on file   Years of education: Not on file   Highest education level: Not on file  Occupational History   Not on file  Tobacco Use   Smoking status: Former    Packs/day: 0.50    Years: 35.00    Additional pack years: 0.00    Total pack years: 17.50    Types: Cigarettes  Smokeless tobacco: Never  Vaping Use   Vaping Use: Never used  Substance and Sexual Activity   Alcohol use: Not Currently   Drug use: Never   Sexual activity: Yes  Other Topics Concern    Not on file  Social History Narrative   Not on file   Social Determinants of Health   Financial Resource Strain: Not on file  Food Insecurity: No Food Insecurity (05/26/2022)   Hunger Vital Sign    Worried About Running Out of Food in the Last Year: Never true    Ran Out of Food in the Last Year: Never true  Transportation Needs: No Transportation Needs (05/26/2022)   PRAPARE - Administrator, Civil Service (Medical): No    Lack of Transportation (Non-Medical): No  Physical Activity: Not on file  Stress: Not on file  Social Connections: Not on file     FAMILY HISTORY:  We obtained a detailed, 4-generation family history.  He reports no family history of cancer or hereditary cancer genetic testing. There is no reported Ashkenazi Jewish ancestry.     GENETIC COUNSELING ASSESSMENT: Jonathan Maynard is a 52 y.o. male with a personal history of gastric cancer which is somewhat suggestive of a hereditary predisposition to cancer. We, therefore, discussed and recommended the following at today's visit.   DISCUSSION: We discussed that 5 - 10% of cancer is hereditary, with many cases of stomach cancer associated with Lynch Syndrome.  There are other genes that can be associated with hereditary stomach cancer syndromes.  We discussed that testing is beneficial for several reasons including knowing how to follow individuals after completing their treatment, identifying whether potential treatment options would be beneficial, and understanding if other family members could be at risk for cancer and allowing them to undergo genetic testing.   We reviewed the characteristics, features and inheritance patterns of hereditary cancer syndromes. We also discussed genetic testing, including the appropriate family members to test, the process of testing, insurance coverage and turn-around-time for results. We discussed the implications of a negative, positive, carrier and/or variant of uncertain  significant result. We recommended Jonathan Maynard pursue genetic testing for a panel that includes genes associated with gastric cancer.   Jonathan Maynard  was offered a common hereditary cancer panel (49 genes) and an expanded pan-cancer panel (70 genes). Jonathan Maynard was informed of the benefits and limitations of each panel, including that expanded pan-cancer panels contain genes that do not have clear management guidelines at this point in time.  We also discussed that as the number of genes included on a panel increases, the chances of variants of uncertain significance increases. After considering the benefits and limitations of each gene panel, Jonathan Maynard elected to have [facility] Custom Panel.  The Custom Hereditary Cancers Panel offered by [facility] includes sequencing and/or deletion duplication testing of the following 49 genes: APC, ATM, AXIN2, BAP1, BARD1, BMPR1A, BRCA1, BRCA2, BRIP1, CDH1, CDK4, CDKN2A (p14ARF and p16INK4a only), CHEK2, CTNNA1, DICER1, EPCAM (Deletion/duplication testing only), FH, GREM1 (promoter region duplication testing only), HOXB13, KIT, MBD4, MEN1, MLH1, MSH2, MSH3, MSH6, MUTYH, NF1, NHTL1, PALB2, PDGFRA, PMS2, POLD1, POLE, PTEN, RAD51C, RAD51D, RHBDF2, SDHA (sequencing analysis only except exon 14), SDHB, SDHC, SDHD, SMAD4, SMARCA4. STK11, TP53, TSC1, TSC2, and VHL.  Based on Jonathan Maynard personal history of cancer, he does not meet NCCN medical criteria for genetic testing. He should not have an out of pocket cost because he has [place] Medicaid. Marland Kitchen   PLAN: After considering the  risks, benefits, and limitations, Jonathan Maynard provided informed consent to pursue genetic testing and the blood sample was sent to [facility] for analysis of the Custom Panel. Results should be available within approximately 2-3 weeks' time, at which point they will be disclosed by telephone to Mr. Vaughan, as will any additional recommendations warranted by these results. Mr. Wade will receive a summary of his  genetic counseling visit and a copy of his results once available. This information will also be available in Epic.   Mr. Lunz questions were answered to his satisfaction today. Our contact information was provided should additional questions or concerns arise. Thank you for the referral and allowing [place] to share in the care of your patient.   Lalla Brothers, MS, [facility] Genetic Counselor [email or web address] (P) [phone]  The patient was seen for a total of 20 minutes in face-to-face genetic counseling. The patient was seen alone.  Drs. Pamelia Hoit and/or Mosetta Putt were available to discuss this case as needed.   _______________________________________________________________________ For Office Staff:  Number of people involved in session: 1 Was an Intern/ student involved with case: no

## 2022-06-17 NOTE — Telephone Encounter (Signed)
Notified Patient of completion of Disability forms. Fax transmission confirmation received. Copy of forms mailed to patient as requested. No other needs or concerns voiced at this time.

## 2022-06-18 ENCOUNTER — Other Ambulatory Visit: Payer: Self-pay

## 2022-06-18 ENCOUNTER — Telehealth: Payer: Self-pay | Admitting: Nurse Practitioner

## 2022-06-18 NOTE — Telephone Encounter (Signed)
Patient's spouse is aware of upcoming appointment time/dates. Stated they would also talk with the patient about the upcoming appointment

## 2022-06-19 ENCOUNTER — Other Ambulatory Visit: Payer: Self-pay

## 2022-06-19 ENCOUNTER — Telehealth: Payer: Self-pay | Admitting: Hematology

## 2022-06-19 ENCOUNTER — Other Ambulatory Visit: Payer: Self-pay | Admitting: Nurse Practitioner

## 2022-06-19 DIAGNOSIS — C163 Malignant neoplasm of pyloric antrum: Secondary | ICD-10-CM

## 2022-06-19 NOTE — Progress Notes (Signed)
Rising CEA 171 -- > 361 in 2 weeks, s/p distal gastrectomy 5/14. A PET scan is being recommended to r/o recurrence. I called pt to review the plan, he was not available by phone. I spoke to his wife, Garrick Midgley, who agrees and will relay the message. F/up after PET.   Santiago Glad, NP

## 2022-06-20 ENCOUNTER — Other Ambulatory Visit: Payer: Self-pay

## 2022-06-27 ENCOUNTER — Telehealth: Payer: Self-pay | Admitting: Hematology

## 2022-06-27 ENCOUNTER — Telehealth: Payer: Self-pay | Admitting: Genetic Counselor

## 2022-06-27 ENCOUNTER — Encounter: Payer: Self-pay | Admitting: Genetic Counselor

## 2022-06-27 DIAGNOSIS — Z1379 Encounter for other screening for genetic and chromosomal anomalies: Secondary | ICD-10-CM | POA: Insufficient documentation

## 2022-06-27 NOTE — Telephone Encounter (Signed)
I contacted Mr. Opdahl to discuss his genetic testing results. No pathogenic variants were identified in the 49 genes analyzed. Detailed clinic note to follow.  The test report has been scanned into EPIC and is located under the Molecular Pathology section of the Results Review tab.  A portion of the result report is included below for reference.   Lalla Brothers, MS, [facility] Genetic Counselor [email or web address] (P) [phone]

## 2022-07-07 ENCOUNTER — Ambulatory Visit (HOSPITAL_COMMUNITY)
Admission: RE | Admit: 2022-07-07 | Discharge: 2022-07-07 | Disposition: A | Discharge status: Home / Self Care | Payer: BC Managed Care – PPO | Source: Ambulatory Visit | Attending: Nurse Practitioner | Admitting: Nurse Practitioner

## 2022-07-07 DIAGNOSIS — C163 Malignant neoplasm of pyloric antrum: Secondary | ICD-10-CM | POA: Insufficient documentation

## 2022-07-07 LAB — GLUCOSE, CAPILLARY: Glucose-Capillary: 91 mg/dL (ref 70–99)

## 2022-07-07 MED ORDER — FLUDEOXYGLUCOSE F - 18 (FDG) INJECTION
8.4000 | Freq: Once | INTRAVENOUS | Status: AC
  Administered 2022-07-07: 8.4 via INTRAVENOUS

## 2022-07-09 ENCOUNTER — Other Ambulatory Visit: Payer: Self-pay

## 2022-07-09 ENCOUNTER — Telehealth: Payer: Self-pay

## 2022-07-09 NOTE — Progress Notes (Signed)
The proposed treatment discussed in conference is for discussion purpose only and is not a binding recommendation.  The patients have not been physically examined, or presented with their treatment options.  Therefore, final treatment plans cannot be decided.  

## 2022-07-09 NOTE — Telephone Encounter (Signed)
Spoke with pt's wife Shelby Dubin regarding pt's appt scheduled on 07/11/2022.  Informed Tawanda that Dr. Mosetta Putt is requesting if the pt could come into the office to go over recent PET Scan results.  Pt's original appt was scheduled as a telephone visit but d/t results Dr. Mosetta Putt would like for the pt to come in person to discuss PET Scan Results.  Tawanda verbalized understanding and stated the pt can come into the office on Friday for the PET Scan results.

## 2022-07-09 NOTE — Progress Notes (Signed)
Email to [employer] in [facility] Pathology department requesting HER2, PD-L1, and Foundation One testing on specimen: [id number] per Dr Latanya Maudlin request and GI conference discussion.

## 2022-07-10 ENCOUNTER — Encounter: Payer: Self-pay | Admitting: Hematology

## 2022-07-10 NOTE — Assessment & Plan Note (Addendum)
cT3N3Mx, with hypermetabolic left supraclavicular node, and indeterminate adrenal nodule, MMR normal ypT3ypN3a -diagnosed 10/18/21 by EGD for 6 month f/u of gastric ulcers, showed mucosal intramucosal adenocarcinoma. MMR normal. -baseline CEA 11/01/21 significantly elevated at 1,989.58. -EUS on 11/06/21 by Dr. Dulce Sellar, staged as T3 N3 (at least stage III) -staging CT CAP 11/08/21 showed: stable gastric antrum wall thickening; increased size of upper abdominal lymph nodes; stable left adrenal nodule, 1.9 cm. -PET scan showed FDG avid lymph nodes within the gastrohepatic ligament, portacaval region, and mesentery. Tracer avid left supraclavicular lymph node and indeterminate left adrenal gland nodule with mild FDG uptake. -he underwent [place] node biopsy on 1/17 which was negative for malignant cells, but no lymphoid tissue seen on biopsy  -repeated PET on 02/07/22 after 3 months neoadjuvant chemo showed resolved left supraclavicular lymph node, and significant improvement in regional lymph nodes and the primary tumor, stable and indeterminate left adrenal nodule.  -CT adrenal protocol showed the left adrenal nodule is likely benign adenoma, no biopsy is needed. -Completed neoadjvant chemo FLOT s/p 12 cycles 11/22/21 - 04/27/2022 -restaging PET on 05/16/22 showed significantly hypermetabolic activity at the primary gastric cancer in pylorus, and a small hypermetabolic mesenteric lymph node, no other evidence of metastasis. surgeon Dr. Sheliah Hatch aware of PET findings -S/p distal gastrectomy 05/20/22, path showed ypT3N3a with clear margins, 7/18 + LNs, and lymphovascular invasion identified. There was some treatment effect in a few lymph nodes nut not in the primary tumor -due to rising tumor marker, he underwent PET scan on July 07, 2022, which unfortunately showed hypermetabolic new adenopathy in right hilar and mesentery, diffuse liver metastasis, consistent with metastatic recurrence.  I personally reviewed the PET  scan images with patient -Unfortunately his disease is not curable at this stage. -I recommend second line chemotherapy with paclitaxel and ramucirumab --Chemotherapy consent: Side effects including but does not not limited to, fatigue, nausea, vomiting, diarrhea, hair loss, neuropathy, fluid retention, renal and kidney dysfunction, neutropenic fever, needed for blood transfusion, bleeding, were discussed with patient in great detail. He agrees to proceed. -The goal of chemotherapy is palliative, to prolong his life -I will request [place] testing on his surgical sample, including HER2, PD-L1 and Tempus, to see if he is a candidate for immunotherapy and targeted therapy.

## 2022-07-10 NOTE — Progress Notes (Signed)
DISCONTINUE ON PATHWAY REGIMEN - Gastroesophageal     A cycle is every 14 days:     Docetaxel      Oxaliplatin      Leucovorin      Fluorouracil   **Always confirm dose/schedule in your pharmacy ordering system**  REASON: Disease Progression PRIOR TREATMENT: [id number]: FLOT q14 Days x 4 Cycles (2 Months) Preoperative, FLOT q14 Days x 4 Cycles (2 Months) Postoperative TREATMENT RESPONSE: Partial Response (PR)  START ON PATHWAY REGIMEN - Gastroesophageal     A cycle is every 28 days:     Ramucirumab      Paclitaxel   **Always confirm dose/schedule in your pharmacy ordering system**  Patient Characteristics: Distant Metastases (cM1/pM1) / Locally Recurrent Disease, Adenocarcinoma - Esophageal, [place], and Gastric, Second Line, MSS/pMMR or MSI Unknown Therapeutic Status: Distant Metastases (No Additional Staging) Histology: Adenocarcinoma Disease Classification: Gastric Line of Therapy: Second Line Microsatellite/Mismatch Repair Status: MSS/pMMR Intent of Therapy: Non-Curative / Palliative Intent, Discussed with Patient

## 2022-07-11 ENCOUNTER — Inpatient Hospital Stay: Payer: BC Managed Care – PPO | Attending: Physician Assistant | Admitting: Hematology

## 2022-07-11 ENCOUNTER — Other Ambulatory Visit: Payer: Self-pay

## 2022-07-11 ENCOUNTER — Encounter: Payer: Self-pay | Admitting: Hematology

## 2022-07-11 VITALS — BP 135/84 | HR 73 | Temp 98.7°F | Resp 16 | Ht 65.0 in | Wt 169.3 lb

## 2022-07-11 DIAGNOSIS — G620 Drug-induced polyneuropathy: Secondary | ICD-10-CM | POA: Diagnosis not present

## 2022-07-11 DIAGNOSIS — Z9221 Personal history of antineoplastic chemotherapy: Secondary | ICD-10-CM | POA: Insufficient documentation

## 2022-07-11 DIAGNOSIS — R970 Elevated carcinoembryonic antigen [CEA]: Secondary | ICD-10-CM | POA: Diagnosis not present

## 2022-07-11 DIAGNOSIS — Z5111 Encounter for antineoplastic chemotherapy: Secondary | ICD-10-CM | POA: Diagnosis not present

## 2022-07-11 DIAGNOSIS — E669 Obesity, unspecified: Secondary | ICD-10-CM | POA: Insufficient documentation

## 2022-07-11 DIAGNOSIS — C787 Secondary malignant neoplasm of liver and intrahepatic bile duct: Secondary | ICD-10-CM | POA: Insufficient documentation

## 2022-07-11 DIAGNOSIS — C163 Malignant neoplasm of pyloric antrum: Secondary | ICD-10-CM | POA: Insufficient documentation

## 2022-07-11 DIAGNOSIS — T451X5A Adverse effect of antineoplastic and immunosuppressive drugs, initial encounter: Secondary | ICD-10-CM | POA: Diagnosis not present

## 2022-07-11 NOTE — Progress Notes (Signed)
[facility]   [phone] [phone]   Clinic Follow up Note   Patient Care Team: Pcp, No as PCP - General Malachy Mood, MD as Consulting Physician (Hematology and Oncology)  Date of Service:  07/11/2022  CHIEF COMPLAINT: f/u of gastric cancer   CURRENT THERAPY:  Ramucirumab D1, 15 + Paclitaxel D1,8,15, q2128d  ASSESSMENT:  Jonathan Maynard is a 52 y.o. male with   Gastric cancer (HCC) cT3N3Mx, with hypermetabolic left supraclavicular node, and indeterminate adrenal nodule, MMR normal ypT3ypN3a -diagnosed 10/18/21 by EGD for 6 month f/u of gastric ulcers, showed mucosal intramucosal adenocarcinoma. MMR normal. -baseline CEA 11/01/21 significantly elevated at 1,989.58. -EUS on 11/06/21 by Dr. Dulce Sellar, staged as T3 N3 (at least stage III) -staging CT CAP 11/08/21 showed: stable gastric antrum wall thickening; increased size of upper abdominal lymph nodes; stable left adrenal nodule, 1.9 cm. -PET scan showed FDG avid lymph nodes within the gastrohepatic ligament, portacaval region, and mesentery. Tracer avid left supraclavicular lymph node and indeterminate left adrenal gland nodule with mild FDG uptake. -he underwent [place] node biopsy on 1/17 which was negative for malignant cells, but no lymphoid tissue seen on biopsy  -repeated PET on 02/07/22 after 3 months neoadjuvant chemo showed resolved left supraclavicular lymph node, and significant improvement in regional lymph nodes and the primary tumor, stable and indeterminate left adrenal nodule.  -CT adrenal protocol showed the left adrenal nodule is likely benign adenoma, no biopsy is needed. -Completed neoadjvant chemo FLOT s/p 12 cycles 11/22/21 - 04/27/2022 -restaging PET on 05/16/22 showed significantly hypermetabolic activity at the primary gastric cancer in pylorus, and a small hypermetabolic mesenteric lymph node, no other evidence of metastasis. surgeon Dr. Sheliah Hatch aware of PET findings -S/p distal  gastrectomy 05/20/22, path showed ypT3N3a with clear margins, 7/18 + LNs, and lymphovascular invasion identified. There was some treatment effect in a few lymph nodes nut not in the primary tumor -due to rising tumor marker, he underwent PET scan on July 07, 2022, which unfortunately showed hypermetabolic new adenopathy in right hilar and mesentery, diffuse liver metastasis, consistent with metastatic recurrence.  I personally reviewed the PET scan images with patient -Unfortunately his disease is not curable at this stage. -I recommend second line chemotherapy with paclitaxel and ramucirumab --Chemotherapy consent: Side effects including but does not not limited to, fatigue, nausea, vomiting, diarrhea, hair loss, neuropathy, fluid retention, renal and kidney dysfunction, neutropenic fever, needed for blood transfusion, bleeding, were discussed with patient in great detail. He agrees to proceed. -The goal of chemotherapy is palliative, to prolong his life -Due to his peripheral neuropathy and the walking schedule, will plan to give Taxol every other week -I will request [facility] testing on his surgical sample, including HER2, PD-L1 and Tempus, to see if he is a candidate for immunotherapy and targeted therapy.  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 -Overall mild to moderate, he is able to function well without significant impact -Not on any medication for neuropathy   PLAN: -reviewed PET scan w/pt -order Tempus,PD-L1 HER2 for target therapy -Discussing different chemo regiments -Start On TAXOL  and ramucirumab in 2 weeks and continue every 2 weeks  -f/u in 2 weeks    SUMMARY OF ONCOLOGIC HISTORY: Oncology History Overview Note   Cancer Staging  Gastric cancer [facility]) Staging form: Stomach, AJCC 8th Edition - Clinical stage from 10/18/2021: Stage III (cT3, cN3a, cM0) - Signed by Malachy Mood, MD on 11/10/2021     Gastric cancer (HCC)  10/18/2021  Procedure    EGD performed under the care of Dr. Bosie Clos  Findings:  -A large, ulcerated, partially circumferential (involving two thirds of the lumen circumference) mass with no bleeding and stigmata of recent bleeding was found in the gastric antrum, at the pylorus and in the prepyloric region of the stomach. -Segmental severe inflammation characterized by congestion (edema) and erythema was found in the gastric antrum.   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 Negative for dysplasia.  Negative for helicobacter pylori  Mismatch Repair (MMR) Protein Imunohistochemistry (IHC): IHC Expression Result: MLH1: Preserved nuclear expression. MSH2: Preserved nuclear expression. MSH6: Preserved nuclear expression. PMS2: Preserved nuclear expression. Interpretation: NORMAL   10/31/2021 Initial Diagnosis   Gastric cancer (HCC)   11/01/2021 Tumor Marker   CEA = 1,610.96 (^)   11/06/2021 Procedure   Upper EUS, Dr. Dulce Sellar  Impression: - Normal esophagus. - A small amount of food (residue) in the stomach. - Congested, friable (with contact bleeding) and ulcerated mucosa in the prepyloric region of the stomach. - Normal examined duodenum. - There was no evidence of significant pathology in the left lobe of the liver. - Many abnormal lymph nodes (over 10) were visualized in the gastrohepatic ligament (level 18), celiac region (level 20), perigastric region, peripancreatic region and aortocaval region. - Wall thickening was seen in the prepyloric region of the stomach. The thickening  appeared to be primarily within the deep mucosa (Layer 2) but extended through the muscularis propria. - Overall constellation of findings consistent with at least T3 N3 Mx (at least stage III) gastric adenocarcinoma.   11/08/2021 Imaging   EXAM: CT CHEST, ABDOMEN, AND PELVIS WITH CONTRAST  IMPRESSION: 1. Similar irregular wall thickening of the gastric antrum, consistent with patient's known primary gastric neoplasm. 2. Increased size of the upper abdominal lymph nodes, concerning for worsening nodal disease involvement. 3. Stable left adrenal nodule common nonspecific possibly a metastatic lesion or adenoma. 4. No evidence of metastatic disease in the chest. 5. Left-sided colonic diverticulosis without findings of acute diverticulitis. 6. Enlarged prostate gland. 7.  Aortic Atherosclerosis (ICD10-I70.0).   11/22/2021 - 04/26/2022 Chemotherapy   Patient is on Treatment Plan : GASTROESOPHAGEAL FLOT q14d X 4 cycles     11/27/2021 Imaging    IMPRESSION: 1. The mass within the gastric antrum is mildly decreased in size when compared with 11/08/2021. There is persistent FDG uptake associated with this mass compatible with residual tumor. 2. Persistent FDG avid lymph nodes within the gastrohepatic ligament, portacaval region, and mesentery. These are mildly decreased in size when compared with 11/08/2021. 3. Tracer avid left supraclavicular lymph node is also mildly decreased in size when compared with 11/08/2021. 4. Indeterminate left adrenal gland nodule with mild FDG uptake. This may represent a lipid poor adenoma. Metastatic disease not exclude. 5. Diffuse increased uptake throughout the bone marrow is favored to represent treatment related change. 6.  Aortic Atherosclerosis (ICD10-I70.0).     02/07/2022 Imaging    IMPRESSION: 1. No substantial change in hypermetabolism associated with the distal gastric lesion. Although difficult to discern on noncontrast CT imaging, the  soft tissue component appears to be decreased since the  prior PET-CT. 2. Interval resolution of the hypermetabolic left supraclavicular lymph node. 3. Interval marked decrease of the hypermetabolic gastrohepatic ligament lymphadenopathy. Similar decrease in size and hypermetabolism associated with index nodes identified previously in the 4. porta hepatis and hypogastric region. 5. Stable left adrenal nodule with slight hypermetabolism. Nonspecific finding could represent lipid poor adenoma or metastatic deposit. 6. No new suspicious hypermetabolic disease on today's study. 7.  Aortic Atherosclerosis (ICD10-I70.0).   08/01/2022 -  Chemotherapy   Patient is on Treatment Plan : GASTROESOPHAGEAL Ramucirumab D1, 15 + Paclitaxel D1,8,15 q28d        INTERVAL HISTORY:  Jonathan Maynard is here for a follow up of gastric cancer. He was last seen by me on 06/17/2022. He presents to the clinic accompanied by wife. Pt state he feels great and that he denies having pain. Pt state that he does have some neuropathy. He has picked up a second job.     All other systems were reviewed with the patient and are negative.  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    SURGICAL HISTORY: Past Surgical History:  Procedure Laterality Date   BIOPSY  04/12/2021   Procedure: BIOPSY;  Surgeon: Charlott Rakes, MD;  Location: [facility];  Service: Gastroenterology;;   ESOPHAGOGASTRODUODENOSCOPY N/A 04/12/2021   Procedure: ESOPHAGOGASTRODUODENOSCOPY (EGD);  Surgeon: Charlott Rakes, MD;  Location: [facility];  Service: Gastroenterology;  Laterality: N/A;   ESOPHAGOGASTRODUODENOSCOPY N/A 11/06/2021   Procedure:  ESOPHAGOGASTRODUODENOSCOPY (EGD);  Surgeon: Willis Modena, MD;  Location: [facility];  Service: Gastroenterology;  Laterality: N/A;   GASTRECTOMY N/A 05/20/2022   Procedure: OPEN PARTIAL GASTRECTOMY WITH GASTROJEJUNAL ANASTOMOSIS;  Surgeon: Sheliah Hatch, De Blanch, MD;  Location: [facility];  Service: General;  Laterality: N/A;   HERNIA REPAIR Left    inguinal   IR IMAGING GUIDED PORT INSERTION  11/15/2021   LAPAROSCOPY N/A 05/20/2022   Procedure: LAPAROSCOPY DIAGNOSTIC;  Surgeon: Rodman Pickle, MD;  Location: [facility];  Service: General;  Laterality: N/A;   UPPER ESOPHAGEAL ENDOSCOPIC ULTRASOUND (EUS) Bilateral 11/06/2021   Procedure: UPPER ESOPHAGEAL ENDOSCOPIC ULTRASOUND (EUS);  Surgeon: Willis Modena, MD;  Location: [facility];  Service: Gastroenterology;  Laterality: Bilateral;    I have reviewed the social history and family history with the patient and they are unchanged from previous note.  ALLERGIES:  has No Known Allergies.  MEDICATIONS:  Current Outpatient Medications  Medication Sig Dispense Refill   acetaminophen (TYLENOL) 500 MG tablet Take 500 mg by mouth 2 (two) times daily. (Patient not taking: Reported on 05/26/2022)     ferrous sulfate 325 (65 FE) MG tablet Take 1 tablet (325 mg total) by mouth daily. (Patient not taking: Reported on 05/15/2022) 30 tablet 3   magic mouthwash (lidocaine, diphenhydrAMINE, alum & mag hydroxide) suspension Swish and spit 15 mLs 3 (three) times daily. (Patient not taking: Reported on 05/26/2022) 100 mL 0   ondansetron (ZOFRAN-ODT) 4 MG disintegrating tablet Dissolve 1 tablet (4 mg total) by mouth every 6 (six) hours as needed for up to 20 doses for nausea or vomiting. (Patient not taking: Reported on 06/17/2022) 20 tablet 0   oxyCODONE (OXY IR/ROXICODONE) 5 MG immediate release tablet Take 1 tablet (  5 mg total) by mouth every 6 (six) hours as needed for severe pain. (Patient not taking: Reported on 06/17/2022) 15 tablet 0   pantoprazole  (PROTONIX) 40 MG tablet Take 1 tablet (40 mg) by mouth 2 times daily. 60 tablet 6   No current facility-administered medications for this visit.    PHYSICAL EXAMINATION: ECOG PERFORMANCE STATUS: 0 - Asymptomatic  Vitals:   07/11/22 1126  BP: 135/84  Pulse: 73  Resp: 16  Temp: 98.7 F (37.1 C)  SpO2: 100%   Wt Readings from Last 3 Encounters:  07/11/22 169 lb 4.8 oz (76.8 kg)  06/17/22 168 lb 8 oz (76.4 kg)  05/20/22 183 lb 8 oz (83.2 kg)     GENERAL:alert, no distress and comfortable SKIN: skin color normal, no rashes or significant lesions EYES: normal, Conjunctiva are pink and non-injected, sclera clear  NEURO: alert & oriented x 3 with fluent speech LABORATORY DATA:  I have reviewed the data as listed    Latest Ref Rng & Units 06/17/2022   11:56 AM 06/05/2022    3:15 PM 05/24/2022    5:04 AM  CBC  WBC 4.0 - 10.5 K/uL 6.5  6.8  11.0   Hemoglobin 13.0 - 17.0 g/dL 9.0  8.7  8.7   Hematocrit 39.0 - 52.0 % 27.1  26.1  27.7   Platelets 150 - 400 K/uL 252  375  259         Latest Ref Rng & Units 06/17/2022   11:56 AM 06/05/2022    3:15 PM 05/21/2022    4:45 AM  CMP  Glucose 70 - 99 mg/dL 98  86  952   BUN 6 - 20 mg/dL 7  5  9    Creatinine 0.61 - 1.24 mg/dL 8.41  3.24  4.01   Sodium 135 - 145 mmol/L 141  140  136   Potassium 3.5 - 5.1 mmol/L 4.0  3.4  4.7   Chloride 98 - 111 mmol/L 110  107  105   CO2 22 - 32 mmol/L 27  27  24    Calcium 8.9 - 10.3 mg/dL 8.7  8.7  8.6   Total Protein 6.5 - 8.1 g/dL 6.5  7.0    Total Bilirubin 0.3 - 1.2 mg/dL 0.3  0.4    Alkaline Phos 38 - 126 U/L 64  65    AST 15 - 41 U/L 12  13    ALT 0 - 44 U/L 6  7        RADIOGRAPHIC STUDIES: I have personally reviewed the radiological images as listed and agreed with the findings in the report. No results found.    Orders Placed This Encounter  Procedures   CBC with Differential (Cancer Center Only)    Standing Status:   Future    Standing Expiration Date:   08/01/2023   CMP (Cancer  Center only)    Standing Status:   Future    Standing Expiration Date:   08/01/2023   CBC with Differential (Cancer Center Only)    Standing Status:   Future    Standing Expiration Date:   08/08/2023   CMP (Cancer Center only)    Standing Status:   Future    Standing Expiration Date:   08/08/2023   All questions were answered. The patient knows to call the clinic with any problems, questions or concerns. No barriers to learning was detected. The total time spent in the appointment was 40 minutes.     Terrace Arabia  Mosetta Putt, MD 07/11/2022   Carolin Coy, CMA, am acting as scribe for Malachy Mood, MD.   I have reviewed the above documentation for accuracy and completeness, and I agree with the above.

## 2022-07-14 ENCOUNTER — Encounter: Payer: Self-pay | Admitting: Genetic Counselor

## 2022-07-14 ENCOUNTER — Ambulatory Visit: Payer: Self-pay | Admitting: Genetic Counselor

## 2022-07-14 ENCOUNTER — Other Ambulatory Visit: Payer: Self-pay

## 2022-07-14 DIAGNOSIS — Z1379 Encounter for other screening for genetic and chromosomal anomalies: Secondary | ICD-10-CM

## 2022-07-14 DIAGNOSIS — C163 Malignant neoplasm of pyloric antrum: Secondary | ICD-10-CM

## 2022-07-14 NOTE — Progress Notes (Signed)
HPI:   Mr. Bermudes was previously seen in the [facility] due to a personal history of cancer and concerns regarding a hereditary predisposition to cancer. Please refer to our prior cancer genetics clinic note for more information regarding our discussion, assessment and recommendations, at the time. Mr. Nelan recent genetic test results were disclosed to him, as were recommendations warranted by these results. These results and recommendations are discussed in more detail below.  CANCER HISTORY:  Oncology History Overview Note   Cancer Staging  Gastric cancer [facility]) Staging form: Stomach, AJCC 8th Edition - Clinical stage from 10/18/2021: Stage III (cT3, cN3a, cM0) - Signed by Malachy Mood, MD on 11/10/2021     Gastric cancer (HCC)  10/18/2021 Procedure   EGD performed under the care of Dr. Bosie Clos  Findings:  -A large, ulcerated, partially circumferential (involving two thirds of the lumen circumference) mass with no bleeding and stigmata of recent bleeding was found in the gastric antrum, at the pylorus and in the prepyloric region of the stomach. -Segmental severe inflammation characterized by congestion (edema) and erythema was found in the gastric antrum.   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 Negative for dysplasia.  Negative for helicobacter pylori  Mismatch Repair (MMR) Protein Imunohistochemistry (IHC): IHC Expression Result: MLH1: Preserved nuclear expression. MSH2: Preserved nuclear expression. MSH6: Preserved nuclear  expression. PMS2: Preserved nuclear expression. Interpretation: NORMAL   10/31/2021 Initial Diagnosis   Gastric cancer (HCC)   11/01/2021 Tumor Marker   CEA = 1,610.96 (^)   11/06/2021 Procedure   Upper EUS, Dr. Dulce Sellar  Impression: - Normal esophagus. - A small amount of food (residue) in the stomach. - Congested, friable (with contact bleeding) and ulcerated mucosa in the prepyloric region of the stomach. - Normal examined duodenum. - There was no evidence of significant pathology in the left lobe of the liver. - Many abnormal lymph nodes (over 10) were visualized in the gastrohepatic ligament (level 18), celiac region (level 20), perigastric region, peripancreatic region and aortocaval region. - Wall thickening was seen in the prepyloric region of the stomach. The thickening appeared to be primarily within the deep mucosa (Layer 2) but extended through the muscularis propria. - Overall constellation of findings consistent with at least T3 N3 Mx (at least stage III) gastric adenocarcinoma.   11/08/2021 Imaging   EXAM: CT CHEST, ABDOMEN, AND PELVIS WITH CONTRAST  IMPRESSION: 1. Similar irregular wall thickening of the gastric antrum, consistent with patient's known primary gastric neoplasm. 2. Increased size of the upper abdominal lymph nodes, concerning for worsening nodal disease involvement. 3. Stable left adrenal nodule common nonspecific possibly a metastatic lesion or adenoma. 4. No evidence of metastatic disease in the chest. 5. Left-sided colonic diverticulosis without findings of acute diverticulitis. 6. Enlarged prostate gland. 7.  Aortic Atherosclerosis (ICD10-I70.0).   11/22/2021 - 04/26/2022 Chemotherapy   Patient is on Treatment Plan : GASTROESOPHAGEAL FLOT q14d X 4 cycles     11/27/2021 Imaging    IMPRESSION: 1. The mass within the gastric antrum is mildly decreased in size when compared with 11/08/2021. There is persistent FDG uptake associated with this  mass  compatible with residual tumor. 2. Persistent FDG avid lymph nodes within the gastrohepatic ligament, portacaval region, and mesentery. These are mildly decreased in size when compared with 11/08/2021. 3. Tracer avid left supraclavicular lymph node is also mildly decreased in size when compared with 11/08/2021. 4. Indeterminate left adrenal gland nodule with mild FDG uptake. This may represent a lipid poor adenoma. Metastatic disease not exclude. 5. Diffuse increased uptake throughout the bone marrow is favored to represent treatment related change. 6.  Aortic Atherosclerosis (ICD10-I70.0).     02/07/2022 Imaging    IMPRESSION: 1. No substantial change in hypermetabolism associated with the distal gastric lesion. Although difficult to discern on noncontrast CT imaging, the soft tissue component appears to be decreased since the prior PET-CT. 2. Interval resolution of the hypermetabolic left supraclavicular lymph node. 3. Interval marked decrease of the hypermetabolic gastrohepatic ligament lymphadenopathy. Similar decrease in size and hypermetabolism associated with index nodes identified previously in the 4. porta hepatis and hypogastric region. 5. Stable left adrenal nodule with slight hypermetabolism. Nonspecific finding could represent lipid poor adenoma or metastatic deposit. 6. No new suspicious hypermetabolic disease on today's study. 7.  Aortic Atherosclerosis (ICD10-I70.0).   08/01/2022 -  Chemotherapy   Patient is on Treatment Plan : GASTROESOPHAGEAL Ramucirumab D1, 15 + Paclitaxel D1,8,15 q28d       FAMILY HISTORY:  We obtained a detailed, 4-generation family history.  He reports no family history of cancer or hereditary cancer genetic testing. There is no reported Ashkenazi Jewish ancestry.         GENETIC TEST RESULTS:  The [facility] Custom Panel found no pathogenic mutations.   The Custom Hereditary Cancers Panel offered by [facility] includes sequencing  and/or deletion duplication testing of the following 49 genes: APC, ATM, AXIN2, BAP1, BARD1, BMPR1A, BRCA1, BRCA2, BRIP1, CDH1, CDK4, CDKN2A (p14ARF and p16INK4a only), CHEK2, CTNNA1, DICER1, EPCAM (Deletion/duplication testing only), FH, GREM1 (promoter region duplication testing only), HOXB13, KIT, MBD4, MEN1, MLH1, MSH2, MSH3, MSH6, MUTYH, NF1, NHTL1, PALB2, PDGFRA, PMS2, POLD1, POLE, PTEN, RAD51C, RAD51D, RHBDF2, SDHA (sequencing analysis only except exon 14), SDHB, SDHC, SDHD, SMAD4, SMARCA4. STK11, TP53, TSC1, TSC2, and VHL.  The test report has been scanned into EPIC and is located under the Molecular Pathology section of the Results Review tab.  A portion of the result report is included below for reference. Genetic testing reported out on 06/25/2022.     Even though a pathogenic variant was not identified, possible explanations for the cancer in the family may include: There may be no hereditary risk for cancer in the family. The cancers in Mr. Keeley and/or his family may be due to other genetic or environmental factors. There may be a gene mutation in one of these genes that current testing methods cannot detect, but that chance is small. There could be another gene that has not yet been discovered, or that we have not yet tested, that is responsible for the cancer diagnoses in the family.   Therefore, it is important to remain in touch with cancer genetics in the future so that we can continue to offer Mr. Nuncio the most up to date genetic testing.   ADDITIONAL GENETIC TESTING:  We discussed with Mr. Viverito that his genetic testing was fairly extensive.  If there are genes identified to increase cancer risk that can be analyzed in the future, we would be happy to discuss and coordinate this testing at that time.    CANCER SCREENING RECOMMENDATIONS:  Mr. Bagby test result is  considered negative (normal).  This means that we have not identified a hereditary cause for his personal and family  history of cancer at this time. Most cancers happen by chance and this negative test suggests that his cancer may fall into this category.    An individual's cancer risk and medical management are not determined by genetic test results alone. Overall cancer risk assessment incorporates additional factors, including personal medical history, family history, and any available genetic information that may result in a personalized plan for cancer prevention and surveillance. Therefore, it is recommended he continue to follow the cancer management and screening guidelines provided by his oncology and primary healthcare provider.  RECOMMENDATIONS FOR FAMILY MEMBERS:   Since he did not inherit a mutation in a cancer predisposition gene included on this panel, his children could not have inherited a mutation from him in one of these genes.  FOLLOW-UP:  Cancer genetics is a rapidly advancing field and it is possible that new genetic tests will be appropriate for him and/or his family members in the future. We encouraged him to remain in contact with cancer genetics on an annual basis so we can update his personal and family histories and let him know of advances in cancer genetics that may benefit this family.   Our contact number was provided. Mr. Aroyo questions were answered to his satisfaction, and he knows he is welcome to call us at anytime with additional questions or concerns.   Lalla Brothers, MS, [facility] Genetic Counselor [email or web address] (P) [phone]

## 2022-07-14 NOTE — Progress Notes (Signed)
Per email from Dr. Mosetta Putt to this nurse and Jill Poling in [facility] Pathology, Please order HER2, PD-L1 (first) and then Tempus on the following case# [id number].  Order placed and pt is scheduled to come in on 07/25/2022 for liquid biopsy.  Tempus Kit given to [employer] in lab.

## 2022-07-15 ENCOUNTER — Other Ambulatory Visit (HOSPITAL_COMMUNITY): Payer: Self-pay

## 2022-07-15 ENCOUNTER — Other Ambulatory Visit: Payer: Self-pay

## 2022-07-16 ENCOUNTER — Other Ambulatory Visit: Payer: Self-pay

## 2022-07-16 ENCOUNTER — Encounter (HOSPITAL_COMMUNITY): Payer: Self-pay | Admitting: Hematology

## 2022-07-17 ENCOUNTER — Encounter (HOSPITAL_COMMUNITY): Payer: Self-pay | Admitting: Hematology

## 2022-07-18 ENCOUNTER — Other Ambulatory Visit (HOSPITAL_COMMUNITY): Payer: Self-pay

## 2022-07-18 ENCOUNTER — Other Ambulatory Visit: Payer: Self-pay | Admitting: Hematology

## 2022-07-19 ENCOUNTER — Other Ambulatory Visit: Payer: Self-pay

## 2022-07-21 ENCOUNTER — Other Ambulatory Visit (HOSPITAL_COMMUNITY): Payer: Self-pay

## 2022-07-22 ENCOUNTER — Encounter: Payer: Self-pay | Admitting: Hematology

## 2022-07-22 ENCOUNTER — Other Ambulatory Visit (HOSPITAL_COMMUNITY): Payer: Self-pay

## 2022-07-22 ENCOUNTER — Encounter: Payer: Self-pay | Admitting: Physician Assistant

## 2022-07-22 MED ORDER — ONDANSETRON 4 MG PO TBDP
4.0000 mg | ORAL_TABLET | Freq: Four times a day (QID) | ORAL | 0 refills | Status: DC | PRN
  Filled 2022-07-22: qty 20, 5d supply, fill #0

## 2022-07-22 MED ORDER — OXYCODONE HCL 5 MG PO TABS
5.0000 mg | ORAL_TABLET | Freq: Four times a day (QID) | ORAL | 0 refills | Status: DC | PRN
  Filled 2022-07-22: qty 15, 4d supply, fill #0

## 2022-07-24 ENCOUNTER — Other Ambulatory Visit (HOSPITAL_COMMUNITY): Payer: Self-pay

## 2022-07-24 MED FILL — Dexamethasone Sodium Phosphate Inj 100 MG/10ML: INTRAMUSCULAR | Qty: 1 | Status: AC

## 2022-07-25 ENCOUNTER — Other Ambulatory Visit (HOSPITAL_COMMUNITY): Payer: Self-pay

## 2022-07-25 ENCOUNTER — Inpatient Hospital Stay (HOSPITAL_BASED_OUTPATIENT_CLINIC_OR_DEPARTMENT_OTHER): Payer: BC Managed Care – PPO | Admitting: Hematology

## 2022-07-25 ENCOUNTER — Inpatient Hospital Stay: Payer: BC Managed Care – PPO

## 2022-07-25 ENCOUNTER — Encounter: Payer: Self-pay | Admitting: Hematology

## 2022-07-25 ENCOUNTER — Other Ambulatory Visit: Payer: Self-pay

## 2022-07-25 VITALS — BP 151/83 | HR 67 | Temp 98.2°F | Resp 18 | Ht 65.0 in | Wt 169.3 lb

## 2022-07-25 VITALS — BP 157/97 | HR 58 | Temp 98.2°F | Resp 17

## 2022-07-25 DIAGNOSIS — C163 Malignant neoplasm of pyloric antrum: Secondary | ICD-10-CM | POA: Diagnosis not present

## 2022-07-25 DIAGNOSIS — Z5111 Encounter for antineoplastic chemotherapy: Secondary | ICD-10-CM | POA: Diagnosis not present

## 2022-07-25 DIAGNOSIS — Z95828 Presence of other vascular implants and grafts: Secondary | ICD-10-CM

## 2022-07-25 LAB — CBC WITH DIFFERENTIAL (CANCER CENTER ONLY)
Abs Immature Granulocytes: 0.01 10*3/uL (ref 0.00–0.07)
Basophils Absolute: 0.1 10*3/uL (ref 0.0–0.1)
Basophils Relative: 1 %
Eosinophils Absolute: 0.1 10*3/uL (ref 0.0–0.5)
Eosinophils Relative: 2 %
HCT: 30 % — ABNORMAL LOW (ref 39.0–52.0)
Hemoglobin: 10 g/dL — ABNORMAL LOW (ref 13.0–17.0)
Immature Granulocytes: 0 %
Lymphocytes Relative: 28 %
Lymphs Abs: 1.2 10*3/uL (ref 0.7–4.0)
MCH: 29.8 pg (ref 26.0–34.0)
MCHC: 33.3 g/dL (ref 30.0–36.0)
MCV: 89.3 fL (ref 80.0–100.0)
Monocytes Absolute: 0.5 10*3/uL (ref 0.1–1.0)
Monocytes Relative: 11 %
Neutro Abs: 2.5 10*3/uL (ref 1.7–7.7)
Neutrophils Relative %: 58 %
Platelet Count: 239 10*3/uL (ref 150–400)
RBC: 3.36 MIL/uL — ABNORMAL LOW (ref 4.22–5.81)
RDW: 15.8 % — ABNORMAL HIGH (ref 11.5–15.5)
WBC Count: 4.4 10*3/uL (ref 4.0–10.5)
nRBC: 0 % (ref 0.0–0.2)

## 2022-07-25 LAB — CMP (CANCER CENTER ONLY)
ALT: 7 U/L (ref 0–44)
AST: 15 U/L (ref 15–41)
Albumin: 3.5 g/dL (ref 3.5–5.0)
Alkaline Phosphatase: 86 U/L (ref 38–126)
Anion gap: 3 — ABNORMAL LOW (ref 5–15)
BUN: 5 mg/dL — ABNORMAL LOW (ref 6–20)
CO2: 28 mmol/L (ref 22–32)
Calcium: 9 mg/dL (ref 8.9–10.3)
Chloride: 109 mmol/L (ref 98–111)
Creatinine: 0.7 mg/dL (ref 0.61–1.24)
GFR, Estimated: >60 mL/min (ref >60)
Glucose, Bld: 98 mg/dL (ref 70–99)
Potassium: 3.6 mmol/L (ref 3.5–5.1)
Sodium: 140 mmol/L (ref 135–145)
Total Bilirubin: 0.6 mg/dL (ref 0.3–1.2)
Total Protein: 6.4 g/dL — ABNORMAL LOW (ref 6.5–8.1)

## 2022-07-25 LAB — CEA (IN HOUSE-CHCC): CEA (CHCC-In House): 978.21 ng/mL — ABNORMAL HIGH (ref 0.00–5.00)

## 2022-07-25 MED ORDER — SODIUM CHLORIDE 0.9% FLUSH
10.0000 mL | Freq: Once | INTRAVENOUS | Status: AC
  Administered 2022-07-25: 10 mL

## 2022-07-25 MED ORDER — DIPHENHYDRAMINE HCL 50 MG/ML IJ SOLN
50.0000 mg | Freq: Once | INTRAMUSCULAR | Status: AC
  Administered 2022-07-25: 50 mg via INTRAVENOUS

## 2022-07-25 MED ORDER — GABAPENTIN 100 MG PO CAPS
100.0000 mg | ORAL_CAPSULE | Freq: Every day | ORAL | 0 refills | Status: DC
  Filled 2022-07-25: qty 60, 20d supply, fill #0

## 2022-07-25 MED ORDER — SODIUM CHLORIDE 0.9% FLUSH
10.0000 mL | INTRAVENOUS | Status: DC | PRN
  Administered 2022-07-25: 10 mL

## 2022-07-25 MED ORDER — SODIUM CHLORIDE 0.9 % IV SOLN
10.0000 mg | Freq: Once | INTRAVENOUS | Status: AC
  Administered 2022-07-25: 10 mg via INTRAVENOUS
  Filled 2022-07-25: qty 10

## 2022-07-25 MED ORDER — SODIUM CHLORIDE 0.9 % IV SOLN
80.0000 mg/m2 | Freq: Once | INTRAVENOUS | Status: AC
  Administered 2022-07-25: 150 mg via INTRAVENOUS
  Filled 2022-07-25: qty 25

## 2022-07-25 MED ORDER — SODIUM CHLORIDE 0.9 % IV SOLN: Freq: Once | INTRAVENOUS | Status: AC

## 2022-07-25 MED ORDER — FAMOTIDINE IN NACL 20-0.9 MG/50ML-% IV SOLN
20.0000 mg | Freq: Once | INTRAVENOUS | Status: AC
  Administered 2022-07-25: 20 mg via INTRAVENOUS

## 2022-07-25 MED ORDER — HEPARIN SOD (PORK) LOCK FLUSH 100 UNIT/ML IV SOLN
500.0000 [IU] | Freq: Once | INTRAVENOUS | Status: AC | PRN
  Administered 2022-07-25: 500 [IU]

## 2022-07-25 NOTE — Progress Notes (Addendum)
[facility]   [phone] [phone]   Clinic Follow up Note   Patient Care Team: Pcp, No as PCP - General Malachy Mood, MD as Consulting Physician (Hematology and Oncology)  Date of Service:  07/25/2022  CHIEF COMPLAINT: f/u of gastric cancer   CURRENT THERAPY:  Ramucirumab D1, 15 + Paclitaxel D1,8,15, q2128d  ASSESSMENT:  Jonathan Maynard is a 52 y.o. male with   Gastric cancer (HCC) cT3N3Mx, with hypermetabolic left supraclavicular node, and indeterminate adrenal nodule, MMR normal ypT3ypN3a -diagnosed 10/18/21 by EGD for 6 month f/u of gastric ulcers, showed mucosal intramucosal adenocarcinoma. MMR normal. -baseline CEA 11/01/21 significantly elevated at 1,989.58. -EUS on 11/06/21 by Dr. Dulce Sellar, staged as T3 N3 (at least stage III) -staging CT CAP 11/08/21 showed: stable gastric antrum wall thickening; increased size of upper abdominal lymph nodes; stable left adrenal nodule, 1.9 cm. -PET scan showed FDG avid lymph nodes within the gastrohepatic ligament, portacaval region, and mesentery. Tracer avid left supraclavicular lymph node and indeterminate left adrenal gland nodule with mild FDG uptake. -he underwent [place] node biopsy on 1/17 which was negative for malignant cells, but no lymphoid tissue seen on biopsy  -repeated PET on 02/07/22 after 3 months neoadjuvant chemo showed resolved left supraclavicular lymph node, and significant improvement in regional lymph nodes and the primary tumor, stable and indeterminate left adrenal nodule.  -CT adrenal protocol showed the left adrenal nodule is likely benign adenoma, no biopsy is needed. -Completed neoadjvant chemo FLOT s/p 12 cycles 11/22/21 - 04/27/2022 -restaging PET on 05/16/22 showed significantly hypermetabolic activity at the primary gastric cancer in pylorus, and a small hypermetabolic mesenteric lymph node, no other evidence of metastasis. surgeon Dr. Sheliah Hatch aware of PET findings -S/p  distal gastrectomy 05/20/22, path showed ypT3N3a with clear margins, 7/18 + LNs, and lymphovascular invasion identified. There was some treatment effect in a few lymph nodes nut not in the primary tumor -due to rising tumor marker, he underwent PET scan on July 07, 2022, which unfortunately showed hypermetabolic new adenopathy in right hilar and mesentery, diffuse liver metastasis, consistent with metastatic recurrence.  I personally reviewed the PET scan images with patient -Unfortunately his disease is not curable at this stage. -I recommend second line chemotherapy with paclitaxel and ramucirumab --Chemotherapy consent: Side effects including but does not not limited to, fatigue, nausea, vomiting, diarrhea, hair loss, neuropathy, fluid retention, renal and kidney dysfunction, neutropenic fever, needed for blood transfusion, bleeding, were discussed with patient in great detail. He agrees to proceed. -The goal of chemotherapy is palliative, to prolong his life -Due to his peripheral neuropathy and the walking schedule, will plan to give Taxol every other week -I discussed his next January sequencing foundation one result, which showed EGFR amplification, no other targetable mutations.  His tumor was previously tested for HER2 which was negative (0-1+).  PD-L1 CPS score was 2%, no significant benefit of immunotherapy. -He was scheduled for first cycle chemotherapy today, but unfortunately chemo was not able to give due to the down of electronic medical system.  Will reschedule to next week.  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 -Overall mild to moderate, he is able to function well without significant impact -Not on any medication for neuropathy   PLAN: -Unfortunately he is not able to receive the first cycle chemotherapy today due to the issue of electronic medical system today. -Will reschedule to next week.     SUMMARY OF ONCOLOGIC HISTORY: Oncology History  Overview Note  Cancer Staging  Gastric cancer [facility]) Staging form: Stomach, AJCC 8th Edition - Clinical stage from 10/18/2021: Stage III (cT3, cN3a, cM0) - Signed by Malachy Mood, MD on 11/10/2021     Gastric cancer (HCC)  10/18/2021 Procedure   EGD performed under the care of Dr. Bosie Clos  Findings:  -A large, ulcerated, partially circumferential (involving two thirds of the lumen circumference) mass with no bleeding and stigmata of recent bleeding was found in the gastric antrum, at the pylorus and in the prepyloric region of the stomach. -Segmental severe inflammation characterized by congestion (edema) and erythema was found in the gastric antrum.   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 Negative for dysplasia.  Negative for helicobacter pylori  Mismatch Repair (MMR) Protein Imunohistochemistry (IHC): IHC Expression Result: MLH1: Preserved nuclear expression. MSH2: Preserved nuclear expression. MSH6: Preserved nuclear expression. PMS2: Preserved nuclear expression. Interpretation: NORMAL   10/31/2021 Initial Diagnosis   Gastric cancer (HCC)   11/01/2021 Tumor Marker   CEA = 1,610.96 (^)   11/06/2021 Procedure   Upper EUS, Dr. Dulce Sellar  Impression: - Normal esophagus. - A small amount of food (residue) in the stomach. - Congested, friable (with contact bleeding) and ulcerated mucosa in the prepyloric region of the stomach. - Normal examined duodenum. - There was no evidence of significant pathology in the left lobe of the liver. - Many abnormal lymph  nodes (over 10) were visualized in the gastrohepatic ligament (level 18), celiac region (level 20), perigastric region, peripancreatic region and aortocaval region. - Wall thickening was seen in the prepyloric region of the stomach. The thickening appeared to be primarily within the deep mucosa (Layer 2) but extended through the muscularis propria. - Overall constellation of findings consistent with at least T3 N3 Mx (at least stage III) gastric adenocarcinoma.   11/08/2021 Imaging   EXAM: CT CHEST, ABDOMEN, AND PELVIS WITH CONTRAST  IMPRESSION: 1. Similar irregular wall thickening of the gastric antrum, consistent with patient's known primary gastric neoplasm. 2. Increased size of the upper abdominal lymph nodes, concerning for worsening nodal disease involvement. 3. Stable left adrenal nodule common nonspecific possibly a metastatic lesion or adenoma. 4. No evidence of metastatic disease in the chest. 5. Left-sided colonic diverticulosis without findings of acute diverticulitis. 6. Enlarged prostate gland. 7.  Aortic Atherosclerosis (ICD10-I70.0).   11/22/2021 - 04/26/2022 Chemotherapy   Patient is on Treatment Plan : GASTROESOPHAGEAL FLOT q14d X 4 cycles     11/27/2021 Imaging    IMPRESSION: 1. The mass within the gastric antrum is mildly decreased in size when compared with 11/08/2021. There is persistent FDG uptake associated with this mass compatible with residual tumor. 2. Persistent FDG avid lymph nodes within the gastrohepatic ligament, portacaval region, and mesentery. These are mildly decreased in size when compared with 11/08/2021. 3. Tracer avid left supraclavicular lymph node is also mildly decreased in size when compared with 11/08/2021. 4. Indeterminate left adrenal gland nodule with mild FDG uptake. This may represent a lipid poor adenoma. Metastatic disease not exclude. 5. Diffuse increased uptake throughout the bone marrow is favored to represent treatment  related change. 6.  Aortic Atherosclerosis (ICD10-I70.0).  02/07/2022 Imaging    IMPRESSION: 1. No substantial change in hypermetabolism associated with the distal gastric lesion. Although difficult to discern on noncontrast CT imaging, the soft tissue component appears to be decreased since the prior PET-CT. 2. Interval resolution of the hypermetabolic left supraclavicular lymph node. 3. Interval marked decrease of the hypermetabolic gastrohepatic ligament lymphadenopathy. Similar decrease in size and hypermetabolism associated with index nodes identified previously in the 4. porta hepatis and hypogastric region. 5. Stable left adrenal nodule with slight hypermetabolism. Nonspecific finding could represent lipid poor adenoma or metastatic deposit. 6. No new suspicious hypermetabolic disease on today's study. 7.  Aortic Atherosclerosis (ICD10-I70.0).   07/25/2022 -  Chemotherapy   Patient is on Treatment Plan : GASTROESOPHAGEAL Ramucirumab D1, 15 + Paclitaxel D1,8,15 q28d        INTERVAL HISTORY:  Jonathan Maynard is here for a follow up of gastric cancer. He was last seen by me on 07/11/2022.  He is accompanied by his wife to clinic today.  He is clinically doing well, mild neuropathy in hands and feet are stable, he wears compression stocks which helps his neuropathy.  No other complaints.  He has 2 jobs, but plan to quit the job at the [employer].    All other systems were reviewed with the patient and are negative.  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    SURGICAL HISTORY: Past Surgical History:  Procedure Laterality Date   BIOPSY  04/12/2021   Procedure: BIOPSY;  Surgeon: Charlott Rakes, MD;  Location: [facility];  Service: Gastroenterology;;   ESOPHAGOGASTRODUODENOSCOPY N/A 04/12/2021   Procedure: ESOPHAGOGASTRODUODENOSCOPY (EGD);  Surgeon: Charlott Rakes, MD;  Location: [facility];  Service: Gastroenterology;  Laterality: N/A;   ESOPHAGOGASTRODUODENOSCOPY N/A 11/06/2021   Procedure: ESOPHAGOGASTRODUODENOSCOPY (EGD);  Surgeon: Willis Modena, MD;  Location: [facility];  Service: Gastroenterology;  Laterality: N/A;   GASTRECTOMY N/A 05/20/2022   Procedure: OPEN PARTIAL GASTRECTOMY WITH GASTROJEJUNAL ANASTOMOSIS;  Surgeon: Sheliah Hatch, De Blanch, MD;  Location: [facility];  Service: General;  Laterality: N/A;   HERNIA REPAIR Left    inguinal   IR IMAGING GUIDED PORT INSERTION  11/15/2021   LAPAROSCOPY N/A 05/20/2022   Procedure: LAPAROSCOPY DIAGNOSTIC;  Surgeon: Rodman Pickle, MD;  Location: [facility];  Service: General;  Laterality: N/A;   UPPER ESOPHAGEAL ENDOSCOPIC ULTRASOUND (EUS) Bilateral 11/06/2021   Procedure: UPPER ESOPHAGEAL ENDOSCOPIC ULTRASOUND (EUS);  Surgeon: Willis Modena, MD;  Location: [facility];  Service: Gastroenterology;  Laterality: Bilateral;    I have reviewed the social history and family history with the patient and they are unchanged from previous note.  ALLERGIES:  has No Known Allergies.  MEDICATIONS:  Current Outpatient Medications  Medication Sig Dispense Refill   gabapentin (NEURONTIN) 100 MG capsule Take 1 capsule (100 mg total) by mouth at bedtime. OK to increase to 2 or 3 capsules in a few weeks if tolerates well 60 capsule 0   acetaminophen (TYLENOL) 500 MG tablet Take 500 mg by mouth 2 (two) times daily. (Patient not taking: Reported on 05/26/2022)     ferrous sulfate 325 (65 FE) MG tablet Take 1 tablet (325 mg total) by mouth daily. (Patient not taking: Reported  on 05/15/2022) 30 tablet 3   magic mouthwash (lidocaine, diphenhydrAMINE, alum & mag hydroxide) suspension Swish and spit 15 mLs 3 (three) times daily. (Patient  not taking: Reported on 05/26/2022) 100 mL 0   ondansetron (ZOFRAN-ODT) 4 MG disintegrating tablet Dissolve 1 tablet (4 mg total) by mouth every 6 (six) hours as needed for nausea or vomiting. 20 tablet 0   oxyCODONE (OXY IR/ROXICODONE) 5 MG immediate release tablet Take 1 tablet (5 mg total) by mouth every 6 (six) hours as needed for severe pain. 15 tablet 0   pantoprazole (PROTONIX) 40 MG tablet Take 1 tablet (40 mg) by mouth 2 times daily. 60 tablet 6   No current facility-administered medications for this visit.    PHYSICAL EXAMINATION: ECOG PERFORMANCE STATUS: 0 - Asymptomatic  Vitals:   07/25/22 0838  BP: (!) 151/83  Pulse: 67  Resp: 18  Temp: 98.2 F (36.8 C)  SpO2: 100%   Wt Readings from Last 3 Encounters:  07/25/22 169 lb 4.8 oz (76.8 kg)  07/11/22 169 lb 4.8 oz (76.8 kg)  06/17/22 168 lb 8 oz (76.4 kg)     GENERAL:alert, no distress and comfortable SKIN: skin color normal, no rashes or significant lesions EYES: normal, Conjunctiva are pink and non-injected, sclera clear  NEURO: alert & oriented x 3 with fluent speech LABORATORY DATA:  I have reviewed the data as listed    Latest Ref Rng & Units 07/25/2022    8:09 AM 06/17/2022   11:56 AM 06/05/2022    3:15 PM  CBC  WBC 4.0 - 10.5 K/uL 4.4  6.5  6.8   Hemoglobin 13.0 - 17.0 g/dL 11.9  9.0  8.7   Hematocrit 39.0 - 52.0 % 30.0  27.1  26.1   Platelets 150 - 400 K/uL 239  252  375         Latest Ref Rng & Units 07/25/2022    8:09 AM 06/17/2022   11:56 AM 06/05/2022    3:15 PM  CMP  Glucose 70 - 99 mg/dL 98  98  86   BUN 6 - 20 mg/dL 5  7  5    Creatinine 0.61 - 1.24 mg/dL 1.47  8.29  5.62   Sodium 135 - 145 mmol/L 140  141  140   Potassium 3.5 - 5.1 mmol/L 3.6  4.0  3.4   Chloride 98 - 111 mmol/L 109  110  107   CO2 22 - 32 mmol/L 28  27  27    Calcium 8.9 - 10.3 mg/dL 9.0  8.7  8.7   Total Protein 6.5 - 8.1 g/dL 6.4  6.5  7.0   Total Bilirubin 0.3 - 1.2 mg/dL 0.6  0.3  0.4   Alkaline Phos 38 - 126 U/L 86  64   65   AST 15 - 41 U/L 15  12  13    ALT 0 - 44 U/L 7  6  7        RADIOGRAPHIC STUDIES: I have personally reviewed the radiological images as listed and agreed with the findings in the report. No results found.    Orders Placed This Encounter  Procedures   CBC with Differential (Cancer Center Only)    Standing Status:   Future    Standing Expiration Date:   08/22/2023   CMP (Cancer Center only)    Standing Status:   Future    Standing Expiration Date:   08/22/2023   CBC with Differential (Cancer Center Only)    Standing  Status:   Future    Standing Expiration Date:   08/29/2023   CMP (Cancer Center only)    Standing Status:   Future    Standing Expiration Date:   08/29/2023   CBC with Differential (Cancer Center Only)    Standing Status:   Future    Standing Expiration Date:   09/05/2023   CMP (Cancer Center only)    Standing Status:   Future    Standing Expiration Date:   09/05/2023   All questions were answered. The patient knows to call the clinic with any problems, questions or concerns. No barriers to learning was detected. The total time spent in the appointment was 40 minutes.     Malachy Mood, MD 07/25/2022

## 2022-07-25 NOTE — Patient Instructions (Signed)
[facility]  Discharge Instructions: Thank you for choosing [facility] to provide your oncology and hematology care.   If you have a lab appointment with the Cancer Center, please go directly to the Cancer Center and check in at the registration area.   Wear comfortable clothing and clothing appropriate for easy access to any Portacath or PICC line.   We strive to give you quality time with your provider. You may need to reschedule your appointment if you arrive late (15 or more minutes).  Arriving late affects you and other patients whose appointments are after yours.  Also, if you miss three or more appointments without notifying the office, you may be dismissed from the clinic at the provider's discretion.      For prescription refill requests, have your pharmacy contact our office and allow 72 hours for refills to be completed.    Today you received the following chemotherapy and/or immunotherapy agents : Paclitaxel      To help prevent nausea and vomiting after your treatment, we encourage you to take your nausea medication as directed.  BELOW ARE SYMPTOMS THAT SHOULD BE REPORTED IMMEDIATELY: *FEVER GREATER THAN 100.4 F (38 C) OR HIGHER *CHILLS OR SWEATING *NAUSEA AND VOMITING THAT IS NOT CONTROLLED WITH YOUR NAUSEA MEDICATION *UNUSUAL SHORTNESS OF BREATH *UNUSUAL BRUISING OR BLEEDING *URINARY PROBLEMS (pain or burning when urinating, or frequent urination) *BOWEL PROBLEMS (unusual diarrhea, constipation, pain near the anus) TENDERNESS IN MOUTH AND THROAT WITH OR WITHOUT PRESENCE OF ULCERS (sore throat, sores in mouth, or a toothache) UNUSUAL RASH, SWELLING OR PAIN  UNUSUAL VAGINAL DISCHARGE OR ITCHING   Items with * indicate a potential emergency and should be followed up as soon as possible or go to the Emergency Department if any problems should occur.  Please show the CHEMOTHERAPY ALERT CARD or IMMUNOTHERAPY ALERT CARD at  check-in to the Emergency Department and triage nurse.  Should you have questions after your visit or need to cancel or reschedule your appointment, please contact [place] [facility]  Dept: [phone]  and follow the prompts.  Office hours are 8:00 a.m. to 4:30 p.m. Monday - Friday. Please note that voicemails left after 4:00 p.m. may not be returned until the following business day.  We are closed weekends and major holidays. You have access to a nurse at all times for urgent questions. Please call the main number to the clinic Dept: [phone] and follow the prompts.   For any non-urgent questions, you may also contact your provider using MyChart. We now offer e-Visits for anyone 24 and older to request care online for non-urgent symptoms. For details visit [email or web address].   Also download the MyChart app! Go to the app store, search "MyChart", open the app, select [place], and log in with your MyChart username and password.  Paclitaxel Injection What is this medication? PACLITAXEL (PAK li TAX el) treats some types of cancer. It works by slowing down the growth of cancer cells. This medicine may be used for other purposes; ask your health care provider or pharmacist if you have questions. COMMON BRAND NAME(S): Onxol, Taxol What should I tell my care team before I take this medication? They need to know if you have any of these conditions: Heart disease Liver disease Low white blood cell levels An unusual or allergic reaction to paclitaxel, other medications, foods, dyes, or preservatives If you or your partner are pregnant or trying to  get pregnant Breast-feeding How should I use this medication? This medication is injected into a vein. It is given by your care team in a hospital or clinic setting. Talk to your care team about the use of this medication in children. While it may be given to children for selected conditions, precautions do  apply. Overdosage: If you think you have taken too much of this medicine contact a poison control center or emergency room at once. NOTE: This medicine is only for you. Do not share this medicine with others. What if I miss a dose? Keep appointments for follow-up doses. It is important not to miss your dose. Call your care team if you are unable to keep an appointment. What may interact with this medication? Do not take this medication with any of the following: Live virus vaccines Other medications may affect the way this medication works. Talk with your care team about all of the medications you take. They may suggest changes to your treatment plan to lower the risk of side effects and to make sure your medications work as intended. This list may not describe all possible interactions. Give your health care provider a list of all the medicines, herbs, non-prescription drugs, or dietary supplements you use. Also tell them if you smoke, drink alcohol, or use illegal drugs. Some items may interact with your medicine. What should I watch for while using this medication? Your condition will be monitored carefully while you are receiving this medication. You may need blood work while taking this medication. This medication may make you feel generally unwell. This is not uncommon as chemotherapy can affect healthy cells as well as cancer cells. Report any side effects. Continue your course of treatment even though you feel ill unless your care team tells you to stop. This medication can cause serious allergic reactions. To reduce the risk, your care team may give you other medications to take before receiving this one. Be sure to follow the directions from your care team. This medication may increase your risk of getting an infection. Call your care team for advice if you get a fever, chills, sore throat, or other symptoms of a cold or flu. Do not treat yourself. Try to avoid being around people who are  sick. This medication may increase your risk to bruise or bleed. Call your care team if you notice any unusual bleeding. Be careful brushing or flossing your teeth or using a toothpick because you may get an infection or bleed more easily. If you have any dental work done, tell your dentist you are receiving this medication. Talk to your care team if you may be pregnant. Serious birth defects can occur if you take this medication during pregnancy. Talk to your care team before breastfeeding. Changes to your treatment plan may be needed. What side effects may I notice from receiving this medication? Side effects that you should report to your care team as soon as possible: Allergic reactions--skin rash, itching, hives, swelling of the face, lips, tongue, or throat Heart rhythm changes--fast or irregular heartbeat, dizziness, feeling faint or lightheaded, chest pain, trouble breathing Increase in blood pressure Infection--fever, chills, cough, sore throat, wounds that don't heal, pain or trouble when passing urine, general feeling of discomfort or being unwell Low blood pressure--dizziness, feeling faint or lightheaded, blurry vision Low red blood cell level--unusual weakness or fatigue, dizziness, headache, trouble breathing Painful swelling, warmth, or redness of the skin, blisters or sores at the infusion site Pain, tingling, or numbness  in the hands or feet Slow heartbeat--dizziness, feeling faint or lightheaded, confusion, trouble breathing, unusual weakness or fatigue Unusual bruising or bleeding Side effects that usually do not require medical attention (report to your care team if they continue or are bothersome): Diarrhea Hair loss Joint pain Loss of appetite Muscle pain Nausea Vomiting This list may not describe all possible side effects. Call your doctor for medical advice about side effects. You may report side effects to FDA at 1-800-FDA-1088. Where should I keep my  medication? This medication is given in a hospital or clinic. It will not be stored at home. NOTE: This sheet is a summary. It may not cover all possible information. If you have questions about this medicine, talk to your doctor, pharmacist, or health care provider.  2024 Elsevier/Gold Standard (2021-05-14 00:00:00)

## 2022-07-28 ENCOUNTER — Other Ambulatory Visit: Payer: Self-pay

## 2022-07-30 ENCOUNTER — Other Ambulatory Visit: Payer: BC Managed Care – PPO

## 2022-07-30 ENCOUNTER — Ambulatory Visit: Payer: BC Managed Care – PPO | Admitting: Nurse Practitioner

## 2022-07-30 ENCOUNTER — Other Ambulatory Visit: Payer: Self-pay

## 2022-07-31 DIAGNOSIS — T451X5A Adverse effect of antineoplastic and immunosuppressive drugs, initial encounter: Secondary | ICD-10-CM | POA: Insufficient documentation

## 2022-07-31 MED FILL — Dexamethasone Sodium Phosphate Inj 100 MG/10ML: INTRAMUSCULAR | Qty: 1 | Status: AC

## 2022-07-31 NOTE — Assessment & Plan Note (Addendum)
cT3N3Mx, with hypermetabolic left supraclavicular node, and indeterminate adrenal nodule, MMR normal ypT3ypN3a -diagnosed 10/18/21 by EGD for 6 month f/u of gastric ulcers, showed mucosal intramucosal adenocarcinoma. MMR normal. -baseline CEA 11/01/21 significantly elevated at 1,989.58. -EUS on 11/06/21 by Dr. Dulce Sellar, staged as T3 N3 (at least stage III) -staging CT CAP 11/08/21 showed: stable gastric antrum wall thickening; increased size of upper abdominal lymph nodes; stable left adrenal nodule, 1.9 cm. -PET scan showed FDG avid lymph nodes within the gastrohepatic ligament, portacaval region, and mesentery. Tracer avid left supraclavicular lymph node and indeterminate left adrenal gland nodule with mild FDG uptake. -he underwent [place] node biopsy on 1/17 which was negative for malignant cells, but no lymphoid tissue seen on biopsy  -repeated PET on 02/07/22 after 3 months neoadjuvant chemo showed resolved left supraclavicular lymph node, and significant improvement in regional lymph nodes and the primary tumor, stable and indeterminate left adrenal nodule.  -CT adrenal protocol showed the left adrenal nodule is likely benign adenoma, no biopsy is needed. -Completed neoadjvant chemo FLOT s/p 12 cycles 11/22/21 - 04/27/2022 -restaging PET on 05/16/22 showed significantly hypermetabolic activity at the primary gastric cancer in pylorus, and a small hypermetabolic mesenteric lymph node, no other evidence of metastasis. surgeon Dr. Sheliah Hatch aware of PET findings -S/p distal gastrectomy 05/20/22, path showed ypT3N3a with clear margins, 7/18 + LNs, and lymphovascular invasion identified. There was some treatment effect in a few lymph nodes nut not in the primary tumor -due to rising tumor marker, he underwent PET scan on July 07, 2022, which unfortunately showed hypermetabolic new adenopathy in right hilar and mesentery, diffuse liver metastasis, consistent with metastatic recurrence.  I personally reviewed the PET  scan images with patient -Unfortunately his disease is not curable at this stage. -I recommend second line chemotherapy with paclitaxel and ramucirumab --Chemotherapy consent: Side effects including but does not not limited to, fatigue, nausea, vomiting, diarrhea, hair loss, neuropathy, fluid retention, renal and kidney dysfunction, neutropenic fever, needed for blood transfusion, bleeding, were discussed with patient in great detail. He agrees to proceed. -The goal of chemotherapy is palliative, to prolong his life -I discussed his next January sequencing foundation one result, which showed EGFR amplification, no other targetable mutations.  His tumor was previously tested for HER2 which was negative (0-1+).  PD-L1 CPS score was 2%, no significant benefit of immunotherapy.  -He started treatment on 07/25/2022  Due to his pre-existing neuropathy, will give paclitaxel and ramucirumab every 2 weeks after today's treatment

## 2022-07-31 NOTE — Assessment & Plan Note (Signed)
[employer] Custom Panel+RNA was Negative.

## 2022-07-31 NOTE — Assessment & Plan Note (Signed)
-  From previous hemotherapy especially oxaliplatin -Overall mild to moderate, he is able to function well without significant impact -Not on any medication for neuropathy

## 2022-08-01 ENCOUNTER — Inpatient Hospital Stay: Payer: BC Managed Care – PPO

## 2022-08-01 ENCOUNTER — Encounter: Payer: Self-pay | Admitting: Hematology

## 2022-08-01 ENCOUNTER — Inpatient Hospital Stay (HOSPITAL_BASED_OUTPATIENT_CLINIC_OR_DEPARTMENT_OTHER): Payer: BC Managed Care – PPO | Admitting: Hematology

## 2022-08-01 ENCOUNTER — Other Ambulatory Visit: Payer: Self-pay

## 2022-08-01 ENCOUNTER — Other Ambulatory Visit: Payer: BC Managed Care – PPO

## 2022-08-01 ENCOUNTER — Ambulatory Visit: Payer: BC Managed Care – PPO | Admitting: Hematology

## 2022-08-01 VITALS — BP 122/70 | HR 84 | Temp 97.9°F | Resp 18 | Ht 65.0 in | Wt 170.2 lb

## 2022-08-01 DIAGNOSIS — C163 Malignant neoplasm of pyloric antrum: Secondary | ICD-10-CM

## 2022-08-01 DIAGNOSIS — G620 Drug-induced polyneuropathy: Secondary | ICD-10-CM

## 2022-08-01 DIAGNOSIS — T451X5A Adverse effect of antineoplastic and immunosuppressive drugs, initial encounter: Secondary | ICD-10-CM

## 2022-08-01 DIAGNOSIS — Z1379 Encounter for other screening for genetic and chromosomal anomalies: Secondary | ICD-10-CM | POA: Diagnosis not present

## 2022-08-01 DIAGNOSIS — Z5111 Encounter for antineoplastic chemotherapy: Secondary | ICD-10-CM | POA: Diagnosis not present

## 2022-08-01 DIAGNOSIS — Z95828 Presence of other vascular implants and grafts: Secondary | ICD-10-CM

## 2022-08-01 LAB — CBC WITH DIFFERENTIAL (CANCER CENTER ONLY)
Abs Immature Granulocytes: 0.1 10*3/uL — ABNORMAL HIGH (ref 0.00–0.07)
Basophils Absolute: 0.1 10*3/uL (ref 0.0–0.1)
Basophils Relative: 1 %
Eosinophils Absolute: 0.1 10*3/uL (ref 0.0–0.5)
Eosinophils Relative: 1 %
HCT: 29.4 % — ABNORMAL LOW (ref 39.0–52.0)
Hemoglobin: 10.1 g/dL — ABNORMAL LOW (ref 13.0–17.0)
Immature Granulocytes: 1 %
Lymphocytes Relative: 22 %
Lymphs Abs: 2.2 10*3/uL (ref 0.7–4.0)
MCH: 30.2 pg (ref 26.0–34.0)
MCHC: 34.4 g/dL (ref 30.0–36.0)
MCV: 88 fL (ref 80.0–100.0)
Monocytes Absolute: 0.4 10*3/uL (ref 0.1–1.0)
Monocytes Relative: 4 %
Neutro Abs: 7.4 10*3/uL (ref 1.7–7.7)
Neutrophils Relative %: 71 %
Platelet Count: 230 10*3/uL (ref 150–400)
RBC: 3.34 MIL/uL — ABNORMAL LOW (ref 4.22–5.81)
RDW: 15.6 % — ABNORMAL HIGH (ref 11.5–15.5)
WBC Count: 10.3 10*3/uL (ref 4.0–10.5)
nRBC: 0 % (ref 0.0–0.2)

## 2022-08-01 LAB — CMP (CANCER CENTER ONLY)
ALT: 8 U/L (ref 0–44)
AST: 11 U/L — ABNORMAL LOW (ref 15–41)
Albumin: 3.8 g/dL (ref 3.5–5.0)
Alkaline Phosphatase: 80 U/L (ref 38–126)
Anion gap: 7 (ref 5–15)
BUN: 10 mg/dL (ref 6–20)
CO2: 25 mmol/L (ref 22–32)
Calcium: 9.1 mg/dL (ref 8.9–10.3)
Chloride: 106 mmol/L (ref 98–111)
Creatinine: 0.74 mg/dL (ref 0.61–1.24)
GFR, Estimated: >60 mL/min (ref >60)
Glucose, Bld: 117 mg/dL — ABNORMAL HIGH (ref 70–99)
Potassium: 3.2 mmol/L — ABNORMAL LOW (ref 3.5–5.1)
Sodium: 138 mmol/L (ref 135–145)
Total Bilirubin: 0.5 mg/dL (ref 0.3–1.2)
Total Protein: 6.3 g/dL — ABNORMAL LOW (ref 6.5–8.1)

## 2022-08-01 MED ORDER — DIPHENHYDRAMINE HCL 50 MG/ML IJ SOLN
50.0000 mg | Freq: Once | INTRAMUSCULAR | Status: AC
  Administered 2022-08-01: 50 mg via INTRAVENOUS
  Filled 2022-08-01: qty 1

## 2022-08-01 MED ORDER — SODIUM CHLORIDE 0.9% FLUSH
10.0000 mL | INTRAVENOUS | Status: DC | PRN
  Administered 2022-08-01: 10 mL

## 2022-08-01 MED ORDER — FAMOTIDINE IN NACL 20-0.9 MG/50ML-% IV SOLN
20.0000 mg | Freq: Once | INTRAVENOUS | Status: AC
  Administered 2022-08-01: 20 mg via INTRAVENOUS
  Filled 2022-08-01: qty 50

## 2022-08-01 MED ORDER — SODIUM CHLORIDE 0.9 % IV SOLN: Freq: Once | INTRAVENOUS | Status: AC

## 2022-08-01 MED ORDER — SODIUM CHLORIDE 0.9% FLUSH
10.0000 mL | Freq: Once | INTRAVENOUS | Status: AC
  Administered 2022-08-01: 10 mL

## 2022-08-01 MED ORDER — SODIUM CHLORIDE 0.9 % IV SOLN
80.0000 mg/m2 | Freq: Once | INTRAVENOUS | Status: AC
  Administered 2022-08-01: 150 mg via INTRAVENOUS
  Filled 2022-08-01: qty 25

## 2022-08-01 MED ORDER — HEPARIN SOD (PORK) LOCK FLUSH 100 UNIT/ML IV SOLN
500.0000 [IU] | Freq: Once | INTRAVENOUS | Status: AC | PRN
  Administered 2022-08-01: 500 [IU]

## 2022-08-01 MED ORDER — SODIUM CHLORIDE 0.9 % IV SOLN
10.0000 mg | Freq: Once | INTRAVENOUS | Status: AC
  Administered 2022-08-01: 10 mg via INTRAVENOUS
  Filled 2022-08-01: qty 10

## 2022-08-01 NOTE — Progress Notes (Signed)
[facility]   Telephone:(336) [phone] Fax:(336) [phone]   Clinic Follow up Note   Patient Care Team: Pcp, No as PCP - General Malachy Mood, MD as Consulting Physician (Hematology and Oncology)  Date of Service:  08/01/2022  CHIEF COMPLAINT: f/u of gastric cancer    CURRENT THERAPY:  Ramucirumab D1, 15 + Paclitaxel D1,8,15, q2128d    ASSESSMENT:  Jonathan Maynard is a 52 y.o. male with   Gastric cancer (HCC) cT3N3Mx, with hypermetabolic left supraclavicular node, and indeterminate adrenal nodule, MMR normal ypT3ypN3a -diagnosed 10/18/21 by EGD for 6 month f/u of gastric ulcers, showed mucosal intramucosal adenocarcinoma. MMR normal. -baseline CEA 11/01/21 significantly elevated at 1,989.58. -EUS on 11/06/21 by Dr. Dulce Sellar, staged as T3 N3 (at least stage III) -staging CT CAP 11/08/21 showed: stable gastric antrum wall thickening; increased size of upper abdominal lymph nodes; stable left adrenal nodule, 1.9 cm. -PET scan showed FDG avid lymph nodes within the gastrohepatic ligament, portacaval region, and mesentery. Tracer avid left supraclavicular lymph node and indeterminate left adrenal gland nodule with mild FDG uptake. -he underwent [place] node biopsy on 1/17 which was negative for malignant cells, but no lymphoid tissue seen on biopsy  -repeated PET on 02/07/22 after 3 months neoadjuvant chemo showed resolved left supraclavicular lymph node, and significant improvement in regional lymph nodes and the primary tumor, stable and indeterminate left adrenal nodule.  -CT adrenal protocol showed the left adrenal nodule is likely benign adenoma, no biopsy is needed. -Completed neoadjvant chemo FLOT s/p 12 cycles 11/22/21 - 04/27/2022 -restaging PET on 05/16/22 showed significantly hypermetabolic activity at the primary gastric cancer in pylorus, and a small hypermetabolic mesenteric lymph node, no other evidence of metastasis. surgeon Dr. Sheliah Hatch aware of PET findings -S/p  distal gastrectomy 05/20/22, path showed ypT3N3a with clear margins, 7/18 + LNs, and lymphovascular invasion identified. There was some treatment effect in a few lymph nodes nut not in the primary tumor -due to rising tumor marker, he underwent PET scan on July 07, 2022, which unfortunately showed hypermetabolic new adenopathy in right hilar and mesentery, diffuse liver metastasis, consistent with metastatic recurrence.  I personally reviewed the PET scan images with patient -Unfortunately his disease is not curable at this stage. -I recommend second line chemotherapy with paclitaxel and ramucirumab --Chemotherapy consent: Side effects including but does not not limited to, fatigue, nausea, vomiting, diarrhea, hair loss, neuropathy, fluid retention, renal and kidney dysfunction, neutropenic fever, needed for blood transfusion, bleeding, were discussed with patient in great detail. He agrees to proceed. -The goal of chemotherapy is palliative, to prolong his life -I discussed his next January sequencing foundation one result, which showed EGFR amplification, no other targetable mutations.  His tumor was previously tested for HER2 which was negative (0-1+).  PD-L1 CPS score was 2%, no significant benefit of immunotherapy.  -He started treatment on 07/25/2022  Due to his pre-existing neuropathy, will give paclitaxel and ramucirumab every 2 weeks after today's treatment    Genetic testing [employer] Custom Panel+RNA was Negative.  Peripheral neuropathy due to chemotherapy (HCC) -From previous hemotherapy especially oxaliplatin -Overall mild to moderate, he is able to function well without significant impact -Not on any medication for neuropathy    PLAN: - proceed with treatment today, Taxol only  - reviewed FO results - reviewed meds for refills - reviewed labs -f/u in 2 weeks with next cycle treatment     SUMMARY OF ONCOLOGIC HISTORY: Oncology History Overview Note   Cancer Staging  Gastric  cancer [facility]) Staging form: Stomach, AJCC 8th Edition - Clinical stage from 10/18/2021: Stage III (cT3, cN3a, cM0) - Signed by Malachy Mood, MD on 11/10/2021     Gastric cancer (HCC)  10/18/2021 Procedure   EGD performed under the care of Dr. Bosie Clos  Findings:  -A large, ulcerated, partially circumferential (involving two thirds of the lumen circumference) mass with no bleeding and stigmata of recent bleeding was found in the gastric antrum, at the pylorus and in the prepyloric region of the stomach. -Segmental severe inflammation characterized by congestion (edema) and erythema was found in the gastric antrum.   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 Negative for dysplasia.  Negative for helicobacter pylori  Mismatch Repair (MMR) Protein Imunohistochemistry (IHC): IHC Expression Result: MLH1: Preserved nuclear expression. MSH2: Preserved nuclear expression. MSH6: Preserved nuclear expression. PMS2: Preserved nuclear expression. Interpretation: NORMAL   10/31/2021 Initial Diagnosis   Gastric cancer (HCC)   11/01/2021 Tumor Marker   CEA = 5,284.13 (^)   11/06/2021 Procedure   Upper EUS, Dr. Dulce Sellar  Impression: - Normal esophagus. - A small amount of food (residue) in the stomach. - Congested, friable (with contact bleeding) and ulcerated mucosa in the prepyloric region of the stomach. - Normal examined duodenum. - There was no evidence of significant pathology in the left lobe of the liver. - Many abnormal lymph nodes (over 10) were visualized in the  gastrohepatic ligament (level 18), celiac region (level 20), perigastric region, peripancreatic region and aortocaval region. - Wall thickening was seen in the prepyloric region of the stomach. The thickening appeared to be primarily within the deep mucosa (Layer 2) but extended through the muscularis propria. - Overall constellation of findings consistent with at least T3 N3 Mx (at least stage III) gastric adenocarcinoma.   11/08/2021 Imaging   EXAM: CT CHEST, ABDOMEN, AND PELVIS WITH CONTRAST  IMPRESSION: 1. Similar irregular wall thickening of the gastric antrum, consistent with patient's known primary gastric neoplasm. 2. Increased size of the upper abdominal lymph nodes, concerning for worsening nodal disease involvement. 3. Stable left adrenal nodule common nonspecific possibly a metastatic lesion or adenoma. 4. No evidence of metastatic disease in the chest. 5. Left-sided colonic diverticulosis without findings of acute diverticulitis. 6. Enlarged prostate gland. 7.  Aortic Atherosclerosis (ICD10-I70.0).   11/22/2021 - 04/26/2022 Chemotherapy   Patient is on Treatment Plan : GASTROESOPHAGEAL FLOT q14d X 4 cycles     11/27/2021 Imaging    IMPRESSION: 1. The mass within the gastric antrum is mildly decreased in size when compared with 11/08/2021. There is persistent FDG uptake associated with this mass compatible with residual tumor. 2. Persistent FDG avid lymph nodes within the gastrohepatic ligament, portacaval region, and mesentery. These are mildly decreased in size when compared with 11/08/2021. 3. Tracer avid left supraclavicular lymph node is also mildly decreased in size when compared with 11/08/2021. 4. Indeterminate left adrenal gland nodule with mild FDG uptake. This may represent a lipid poor adenoma. Metastatic disease not exclude. 5. Diffuse increased uptake throughout the bone marrow is favored to represent treatment related change. 6.  Aortic Atherosclerosis  (ICD10-I70.0).     02/07/2022 Imaging  IMPRESSION: 1. No substantial change in hypermetabolism associated with the distal gastric lesion. Although difficult to discern on noncontrast CT imaging, the soft tissue component appears to be decreased since the prior PET-CT. 2. Interval resolution of the hypermetabolic left supraclavicular lymph node. 3. Interval marked decrease of the hypermetabolic gastrohepatic ligament lymphadenopathy. Similar decrease in size and hypermetabolism associated with index nodes identified previously in the 4. porta hepatis and hypogastric region. 5. Stable left adrenal nodule with slight hypermetabolism. Nonspecific finding could represent lipid poor adenoma or metastatic deposit. 6. No new suspicious hypermetabolic disease on today's study. 7.  Aortic Atherosclerosis (ICD10-I70.0).   07/25/2022 -  Chemotherapy   Patient is on Treatment Plan : GASTROESOPHAGEAL Ramucirumab D1, 15 + Paclitaxel D1,8,15 q28d        INTERVAL HISTORY:  Jonathan Maynard is here for a follow up of gastric cancer. He was last seen by me on 07/25/2022.  He is accompanied by his wife to clinic today.  He is clinically doing well.     All other systems were reviewed with the patient and are negative.  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    SURGICAL HISTORY: Past Surgical History:  Procedure Laterality Date   BIOPSY  04/12/2021   Procedure: BIOPSY;  Surgeon: Charlott Rakes, MD;  Location: [facility];  Service: Gastroenterology;;   ESOPHAGOGASTRODUODENOSCOPY N/A 04/12/2021   Procedure: ESOPHAGOGASTRODUODENOSCOPY (EGD);  Surgeon: Charlott Rakes, MD;  Location: [facility];   Service: Gastroenterology;  Laterality: N/A;   ESOPHAGOGASTRODUODENOSCOPY N/A 11/06/2021   Procedure: ESOPHAGOGASTRODUODENOSCOPY (EGD);  Surgeon: Willis Modena, MD;  Location: [facility];  Service: Gastroenterology;  Laterality: N/A;   GASTRECTOMY N/A 05/20/2022   Procedure: OPEN PARTIAL GASTRECTOMY WITH GASTROJEJUNAL ANASTOMOSIS;  Surgeon: Sheliah Hatch, De Blanch, MD;  Location: [facility];  Service: General;  Laterality: N/A;   HERNIA REPAIR Left    inguinal   IR IMAGING GUIDED PORT INSERTION  11/15/2021   LAPAROSCOPY N/A 05/20/2022   Procedure: LAPAROSCOPY DIAGNOSTIC;  Surgeon: Rodman Pickle, MD;  Location: [facility];  Service: General;  Laterality: N/A;   UPPER ESOPHAGEAL ENDOSCOPIC ULTRASOUND (EUS) Bilateral 11/06/2021   Procedure: UPPER ESOPHAGEAL ENDOSCOPIC ULTRASOUND (EUS);  Surgeon: Willis Modena, MD;  Location: [facility];  Service: Gastroenterology;  Laterality: Bilateral;    I have reviewed the social history and family history with the patient and they are unchanged from previous note.  ALLERGIES:  has No Known Allergies.  MEDICATIONS:  Current Outpatient Medications  Medication Sig Dispense Refill   acetaminophen (TYLENOL) 500 MG tablet Take 500 mg by mouth 2 (two) times daily. (Patient not taking: Reported on 05/26/2022)     ferrous sulfate 325 (65 FE) MG tablet Take 1 tablet (325 mg total) by mouth daily. (Patient not taking: Reported on 05/15/2022) 30 tablet 3   gabapentin (NEURONTIN) 100 MG capsule Take 1 capsule (100 mg total) by mouth at bedtime. OK to increase to 2 or 3 capsules in a few weeks if tolerates well 60 capsule 0   magic mouthwash (lidocaine, diphenhydrAMINE, alum & mag hydroxide) suspension Swish and spit 15 mLs 3 (three) times daily. (Patient not taking: Reported on 05/26/2022) 100 mL 0   ondansetron (ZOFRAN-ODT) 4  MG disintegrating tablet Dissolve 1 tablet (4 mg total) by mouth every 6 (six) hours as needed for nausea or vomiting. 20 tablet 0   oxyCODONE  (OXY IR/ROXICODONE) 5 MG immediate release tablet Take 1 tablet (5 mg total) by mouth every 6 (six) hours as needed for severe pain. 15 tablet 0   pantoprazole (PROTONIX) 40 MG tablet Take 1 tablet (40 mg) by mouth 2 times daily. 60 tablet 6   No current facility-administered medications for this visit.   Facility-Administered Medications Ordered in Other Visits  Medication Dose Route Frequency Provider Last Rate Last Admin   sodium chloride flush (NS) 0.9 % injection 10 mL  10 mL Intracatheter PRN Malachy Mood, MD   10 mL at 08/01/22 1347    PHYSICAL EXAMINATION: ECOG PERFORMANCE STATUS: 1 - Symptomatic but completely ambulatory  Vitals:   08/01/22 0940  BP: 122/70  Pulse: 84  Resp: 18  Temp: 97.9 F (36.6 C)  SpO2: 100%   Wt Readings from Last 3 Encounters:  08/01/22 170 lb 3.2 oz (77.2 kg)  07/25/22 169 lb 4.8 oz (76.8 kg)  07/11/22 169 lb 4.8 oz (76.8 kg)     GENERAL:alert, no distress and comfortable SKIN: skin color, texture, turgor are normal, no rashes or significant lesions EYES: normal, Conjunctiva are pink and non-injected, sclera clear NECK: supple, thyroid normal size, non-tender, without nodularity LYMPH:  no palpable lymphadenopathy in the cervical, axillary  LUNGS: clear to auscultation and percussion with normal breathing effort HEART: regular rate & rhythm and no murmurs and no lower extremity edema ABDOMEN:abdomen soft, non-tender and normal bowel sounds Musculoskeletal:no cyanosis of digits and no clubbing  NEURO: alert & oriented x 3 with fluent speech, no focal motor/sensory deficits  LABORATORY DATA:  I have reviewed the data as listed    Latest Ref Rng & Units 08/01/2022    9:20 AM 07/25/2022    8:09 AM 06/17/2022   11:56 AM  CBC  WBC 4.0 - 10.5 K/uL 10.3  4.4  6.5   Hemoglobin 13.0 - 17.0 g/dL 16.1  09.6  9.0   Hematocrit 39.0 - 52.0 % 29.4  30.0  27.1   Platelets 150 - 400 K/uL 230  239  252         Latest Ref Rng & Units 08/01/2022    9:20  AM 07/25/2022    8:09 AM 06/17/2022   11:56 AM  CMP  Glucose 70 - 99 mg/dL 045  98  98   BUN 6 - 20 mg/dL 10  5  7    Creatinine 0.61 - 1.24 mg/dL 4.09  8.11  9.14   Sodium 135 - 145 mmol/L 138  140  141   Potassium 3.5 - 5.1 mmol/L 3.2  3.6  4.0   Chloride 98 - 111 mmol/L 106  109  110   CO2 22 - 32 mmol/L 25  28  27    Calcium 8.9 - 10.3 mg/dL 9.1  9.0  8.7   Total Protein 6.5 - 8.1 g/dL 6.3  6.4  6.5   Total Bilirubin 0.3 - 1.2 mg/dL 0.5  0.6  0.3   Alkaline Phos 38 - 126 U/L 80  86  64   AST 15 - 41 U/L 11  15  12    ALT 0 - 44 U/L 8  7  6        RADIOGRAPHIC STUDIES: I have personally reviewed the radiological images as listed and agreed with the findings in the report. No results  found.    Orders Placed This Encounter  Procedures   CEA (IN HOUSE-[facility])    Standing Status:   Standing    Number of Occurrences:   5    Standing Expiration Date:   08/01/2023   CBC with Differential (Cancer Center Only)    Standing Status:   Future    Standing Expiration Date:   09/12/2023   CMP (Cancer Center only)    Standing Status:   Future    Standing Expiration Date:   09/12/2023   T4    Standing Status:   Future    Standing Expiration Date:   09/12/2023   TSH    Standing Status:   Future    Standing Expiration Date:   09/12/2023   Total Protein, Urine dipstick    Standing Status:   Future    Standing Expiration Date:   09/12/2023   CBC with Differential (Cancer Center Only)    Standing Status:   Future    Standing Expiration Date:   09/26/2023   CMP (Cancer Center only)    Standing Status:   Future    Standing Expiration Date:   09/26/2023   All questions were answered. The patient knows to call the clinic with any problems, questions or concerns. No barriers to learning was detected. The total time spent in the appointment was 25 minutes.     Malachy Mood, MD 08/01/2022   I, Sharlette Dense, CMA, am acting as scribe for Malachy Mood, MD.   I have reviewed the above documentation for accuracy and  completeness, and I agree with the above.

## 2022-08-01 NOTE — Patient Instructions (Signed)
[facility]  Discharge Instructions: Thank you for choosing [facility] to provide your oncology and hematology care.   If you have a lab appointment with the Cancer Center, please go directly to the Cancer Center and check in at the registration area.   Wear comfortable clothing and clothing appropriate for easy access to any Portacath or PICC line.   We strive to give you quality time with your provider. You may need to reschedule your appointment if you arrive late (15 or more minutes).  Arriving late affects you and other patients whose appointments are after yours.  Also, if you miss three or more appointments without notifying the office, you may be dismissed from the clinic at the provider's discretion.      For prescription refill requests, have your pharmacy contact our office and allow 72 hours for refills to be completed.    Today you received the following chemotherapy and/or immunotherapy agents Paclitaxel      To help prevent nausea and vomiting after your treatment, we encourage you to take your nausea medication as directed.  BELOW ARE SYMPTOMS THAT SHOULD BE REPORTED IMMEDIATELY: *FEVER GREATER THAN 100.4 F (38 C) OR HIGHER *CHILLS OR SWEATING *NAUSEA AND VOMITING THAT IS NOT CONTROLLED WITH YOUR NAUSEA MEDICATION *UNUSUAL SHORTNESS OF BREATH *UNUSUAL BRUISING OR BLEEDING *URINARY PROBLEMS (pain or burning when urinating, or frequent urination) *BOWEL PROBLEMS (unusual diarrhea, constipation, pain near the anus) TENDERNESS IN MOUTH AND THROAT WITH OR WITHOUT PRESENCE OF ULCERS (sore throat, sores in mouth, or a toothache) UNUSUAL RASH, SWELLING OR PAIN  UNUSUAL VAGINAL DISCHARGE OR ITCHING   Items with * indicate a potential emergency and should be followed up as soon as possible or go to the Emergency Department if any problems should occur.  Please show the CHEMOTHERAPY ALERT CARD or IMMUNOTHERAPY ALERT CARD at  check-in to the Emergency Department and triage nurse.  Should you have questions after your visit or need to cancel or reschedule your appointment, please contact [facility]  Dept: [phone]  and follow the prompts.  Office hours are 8:00 a.m. to 4:30 p.m. Monday - Friday. Please note that voicemails left after 4:00 p.m. may not be returned until the following business day.  We are closed weekends and major holidays. You have access to a nurse at all times for urgent questions. Please call the main number to the clinic Dept: [phone] and follow the prompts.   For any non-urgent questions, you may also contact your provider using MyChart. We now offer e-Visits for anyone 18 and older to request care online for non-urgent symptoms. For details visit [email or web address].   Also download the MyChart app! Go to the app store, search "MyChart", open the app, select [place], and log in with your MyChart username and password.   

## 2022-08-01 NOTE — Progress Notes (Signed)
Per Dr Mosetta Putt, 1st time Cyramza will not be given today. Consent form has already been signed and filed.

## 2022-08-03 ENCOUNTER — Other Ambulatory Visit: Payer: Self-pay

## 2022-08-05 ENCOUNTER — Telehealth: Payer: Self-pay

## 2022-08-05 NOTE — Telephone Encounter (Signed)
Notified Patient of completion of FMLA forms. Fax transmission confirmation received. Copy of forms and fax confirmation mailed to Patient as requested. No other needs or concerns voiced at this time.

## 2022-08-14 MED FILL — Dexamethasone Sodium Phosphate Inj 100 MG/10ML: INTRAMUSCULAR | Qty: 1 | Status: AC

## 2022-08-14 NOTE — Progress Notes (Addendum)
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 MMR normal. -baseline CEA 11/01/21 significantly elevated at 1,989.58. -EUS on 11/06/21 by Dr. Dulce Sellar, staged as T3 N3 (at least stage III) -staging CT CAP 11/08/21 showed: stable gastric antrum wall thickening; increased size of upper abdominal lymph nodes; stable left adrenal nodule, 1.9 cm. -PET scan showed FDG avid lymph nodes within the gastrohepatic ligament, portacaval region, and mesentery. Tracer avid left supraclavicular lymph node and indeterminate left adrenal gland nodule with mild FDG uptake. -he underwent [place] node biopsy on 1/17 which was negative for malignant cells, but no lymphoid tissue seen on biopsy  -repeated PET on 02/07/22 after 3 months neoadjuvant chemo showed resolved left supraclavicular lymph node, and significant improvement in regional lymph nodes and the primary tumor, stable and indeterminate left adrenal nodule.  -CT adrenal protocol showed the left adrenal nodule is likely benign adenoma, no biopsy is needed. -Completed neoadjvant chemo FLOT s/p 12 cycles 11/22/21 - 04/27/2022 -restaging PET on 05/16/22 showed significantly hypermetabolic activity at the primary gastric cancer in pylorus, and a small hypermetabolic mesenteric lymph node, no other evidence of metastasis. surgeon Dr. Sheliah Hatch aware of PET findings -S/p distal gastrectomy 05/20/22, path showed ypT3N3a with clear margins, 7/18 + LNs, and lymphovascular invasion identified. There was some treatment effect in a few lymph nodes nut not in the primary tumor -due to  rising tumor marker, he underwent PET scan on July 07, 2022, which unfortunately showed hypermetabolic new adenopathy in right hilar and mesentery, diffuse liver metastasis, consistent with metastatic recurrence.  I personally reviewed the PET scan images with patient -Unfortunately his disease is not curable at this stage. -I recommend second line chemotherapy with paclitaxel and ramucirumab --Chemotherapy consent: Side effects including but does not not limited to, fatigue, nausea, vomiting, diarrhea, hair loss, neuropathy, fluid retention, renal and kidney dysfunction, neutropenic fever, needed for blood transfusion, bleeding, were discussed with patient in great detail. He agrees to proceed. -The goal of chemotherapy is palliative, to prolong his life -I discussed his next January sequencing foundation one result, which showed EGFR amplification, no other targetable mutations.  His tumor was previously tested for HER2 which was negative (0-1+).  PD-L1 CPS score was 2%, no significant benefit of immunotherapy.  -He started treatment on 07/25/2022  Due to his pre-existing neuropathy, will give paclitaxel and ramucirumab every 2 weeks after today's treatment  08/15/2022 - he is scheduled for Cycle 3 today  -he is reporting a significant increase in neuropathy since his last visit. This is mostly effecting his hands. They feel swollen and they throb. Sometimes, the hands are hard to move. His feet hurt when he stands or walks for too long. He is currently taking gabapentin 100mg  up to four times daily. He states that neuropathy is worse In the mornings. He does use his hands a lot at work. Neuropathy is beginning to negatively impact his work.  -states that he does have decreased taste sensation. Makes him not feel like eating. Eats anyway because he know he has to. Drinking electrolyte drinks. States that he does get some nausea for which he takes prescribed medications. Tries to stay  ahead of the nausea with  medication. States that his bowels move "ok." Gets very mild constipation intermittently.  -denies fever, chills, or unusual night sweats. He reports some fatigue which is more severe the first few days after chemotherapy.  -he continues to work full time. -he did bring from from [facility] for handicap parking pass. He states that neuropathy in his feet is getting worse and standing or walking for long periods does make this worse. Form was completed during his visit and returned to him. Application is approved for 6 months.  -due to severity of neuropathy, patient will receive reduced dose paclitaxel and ramucirumab. Treatment to be changed for next infusion in 2 weeks.    Peripheral neuropathy due to chemotherapy [facility]) -From previous hemotherapy especially oxaliplatin -worsened since cycle 2 of new chemotherapy. Beginning to interfere with his work and life.  -increased dosing parameters for gabapentin. Will keep dose at 100 mg. He may take 1 to 2 capsules up to 4 times daily as needed. A new prescription was sent to [facility].  -patient to receive reduced dose paclitaxel and ramucirumab today.  -chemotherapy treatment to be changed for next infusion in 2 weeks.    Iron deficiency anemia due to chronic blood loss Hgb and Hct slightly lower today than previous lab checks. Encouraged patient to restart oral iron.   08/15/2022 - dose reduction to 60% due to worsening neuropathy.    Plan: Reviewed labs. -Hgb and Hct slightly lower than prior checks. Recommend he restart oral iron supplement.  -WBC and Plt normal. ANC 5.7 -K 3.2; Ca 8.5. normal LFTs and normal kidney functions. He has started to drink electrolyte containing waters. Recommended OTC potassium supplement.  -increased dosing parameters of gabapentin 100 mg. He may take 1 to 2 capsules up to four times daily as needed for neuropathy.  -will receive reduced dose paclitaxel and remucirumab today.  -treatment change expected for  next treatment in 2 weeks.  -labs/flush, follow up, new treatment in 2 weeks.    The patient understands the plans discussed today and is in agreement with them.  He knows to contact our office if he develops concerns prior to his next appointment.  I provided 30 minutes of face-to-face time during this encounter and > 50% was spent counseling as documented under my assessment and plan.    Carlean Jews, NP  [place] [facility] [address] [place] [address] Dept: [phone] Dept Fax: [phone]   No orders of the defined types were placed in this encounter.     CHIEF COMPLAINT:  CC: f/u metastatic gastric cancer  Current Treatment:  paclitaxel and ramucirumab every 14 days  INTERVAL HISTORY:  Jonathan Maynard is here today for repeat clinical assessment. He denies fevers or chills. He denies pain. His appetite is good. His weight has increased 2 pounds over last 2 weeks .  Gastric cancer -He started second line treatment on 07/25/2022  Due to his pre-existing neuropathy, was given paclitaxel and ramucirumab every 2 weeks.  08/15/2022 - he is scheduled for Cycle 3 today  -he is reporting a significant increase in neuropathy since his last visit. This is mostly effecting his hands. They feel swollen and they throb. Sometimes, the hands are hard to move. His feet hurt when he stands or walks for too long. He is currently taking gabapentin 100mg  up to four times daily. He states that neuropathy is worse In the mornings.  He does use his hands a lot at work. Neuropathy is beginning to negatively impact his work.  -states that he does have decreased taste sensation. Makes him not feel like eating. Eats anyway because he know he has to. Drinking electrolyte drinks. States that he does get some nausea for which he takes prescribed medications. Tries to stay ahead of the nausea with medication. States that his bowels move  "ok." Gets very mild constipation intermittently.  -denies fever, chills, or unusual night sweats. He reports some fatigue which is more severe the first few days after chemotherapy.  -he continues to work full time. -he did bring from from [facility] for handicap parking pass. He states that neuropathy in his feet is getting worse and standing or walking for long periods does make this worse. Form was completed during his visit and returned to him. Application is approved for 6 months.  -due to severity of neuropathy, patient will receive reduced dose paclitaxel and ramucirumab. Treatment to be changed for next infusion in 2 weeks.   I have reviewed the past medical history, past surgical history, social history and family history with the patient and they are unchanged from previous note.  ALLERGIES:  has No Known Allergies.  MEDICATIONS:  Current Outpatient Medications  Medication Sig Dispense Refill   acetaminophen (TYLENOL) 500 MG tablet Take 500 mg by mouth 2 (two) times daily.     ondansetron (ZOFRAN-ODT) 4 MG disintegrating tablet Dissolve 1 tablet (4 mg total) by mouth every 6 (six) hours as needed for nausea or vomiting. 20 tablet 0   oxyCODONE (OXY IR/ROXICODONE) 5 MG immediate release tablet Take 1 tablet (5 mg total) by mouth every 6 (six) hours as needed for severe pain. 15 tablet 0   pantoprazole (PROTONIX) 40 MG tablet Take 1 tablet (40 mg) by mouth 2 times daily. 60 tablet 6   gabapentin (NEURONTIN) 100 MG capsule Take 1-2 capsules (100-200 mg total) by mouth 4 (four) times daily as needed. OK to increase to 2 or 3 capsules in a few weeks if tolerates well 240 capsule 0   magic mouthwash (lidocaine, diphenhydrAMINE, alum & mag hydroxide) suspension Swish and spit 15 mLs 3 (three) times daily. (Patient not taking: Reported on 05/26/2022) 100 mL 0   No current facility-administered medications for this visit.   Facility-Administered Medications Ordered in Other Visits  Medication Dose  Route Frequency Provider Last Rate Last Admin   dexamethasone (DECADRON) 10 mg in sodium chloride 0.9 % 50 mL IVPB  10 mg Intravenous Once Malachy Mood, MD 204 mL/hr at 08/15/22 1305 10 mg at 08/15/22 1305   heparin lock flush 100 unit/mL  500 Units Intracatheter Once PRN Malachy Mood, MD       PACLitaxel (TAXOL) 114 mg in sodium chloride 0.9 % 250 mL chemo infusion (</= 80mg /m2)  60 mg/m2 (Order-Specific) Intravenous Once Malachy Mood, MD       ramucirumab [facility]) 600 mg in sodium chloride 0.9 % 190 mL chemo infusion  8 mg/kg (Order-Specific) Intravenous Once Malachy Mood, MD       sodium chloride flush (NS) 0.9 % injection 10 mL  10 mL Intracatheter PRN Malachy Mood, MD        HISTORY OF PRESENT ILLNESS:   Oncology History Overview Note   Cancer Staging  Gastric cancer [facility]) Staging form: Stomach, AJCC 8th Edition - Clinical stage from 10/18/2021: Stage III (cT3, cN3a, cM0) - Signed by Malachy Mood, MD on 11/10/2021     Gastric cancer (HCC)  10/18/2021 Procedure   EGD performed under the care of Dr. Bosie Clos  Findings:  -A large, ulcerated, partially circumferential (involving two thirds of the lumen circumference) mass with no bleeding and stigmata of recent bleeding was found in the gastric antrum, at the pylorus and in the prepyloric region of the stomach. -Segmental severe inflammation characterized by congestion (edema) and erythema was found in the gastric antrum.   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 Negative for dysplasia.  Negative for helicobacter  pylori  Mismatch Repair (MMR) Protein Imunohistochemistry (IHC): IHC Expression Result: MLH1: Preserved nuclear expression. MSH2: Preserved nuclear expression. MSH6: Preserved nuclear expression. PMS2: Preserved nuclear expression. Interpretation: NORMAL   10/31/2021 Initial Diagnosis   Gastric cancer (HCC)   11/01/2021 Tumor Marker   CEA = 1,610.96 (^)   11/06/2021 Procedure   Upper EUS, Dr. Dulce Sellar  Impression: - Normal esophagus. - A small amount of food (residue) in the stomach. - Congested, friable (with contact bleeding) and ulcerated mucosa in the prepyloric region of the stomach. - Normal examined duodenum. - There was no evidence of significant pathology in the left lobe of the liver. - Many abnormal lymph nodes (over 10) were visualized in the gastrohepatic ligament (level 18), celiac region (level 20), perigastric region, peripancreatic region and aortocaval region. - Wall thickening was seen in the prepyloric region of the stomach. The thickening appeared to be primarily within the deep mucosa (Layer 2) but extended through the muscularis propria. - Overall constellation of findings consistent with at least T3 N3 Mx (at least stage III) gastric adenocarcinoma.   11/08/2021 Imaging   EXAM: CT CHEST, ABDOMEN, AND PELVIS WITH CONTRAST  IMPRESSION: 1. Similar irregular wall thickening of the gastric antrum, consistent with patient's known primary gastric neoplasm. 2. Increased size of the upper abdominal lymph nodes, concerning for worsening nodal disease involvement. 3. Stable left adrenal nodule common nonspecific possibly a metastatic lesion or adenoma. 4. No evidence of metastatic disease in the chest. 5. Left-sided colonic diverticulosis without findings of acute diverticulitis. 6. Enlarged prostate gland. 7.  Aortic Atherosclerosis (ICD10-I70.0).   11/22/2021 - 04/26/2022 Chemotherapy   Patient is on Treatment Plan : GASTROESOPHAGEAL FLOT q14d X 4 cycles      11/27/2021 Imaging    IMPRESSION: 1. The mass within the gastric antrum is mildly decreased in size when compared with 11/08/2021. There is persistent FDG uptake associated with this mass compatible with residual tumor. 2. Persistent FDG avid lymph nodes within the gastrohepatic ligament, portacaval region, and mesentery. These are mildly decreased in size when compared with 11/08/2021. 3. Tracer avid left supraclavicular lymph node is also mildly decreased in size when compared with 11/08/2021. 4. Indeterminate left adrenal gland nodule with mild FDG uptake. This may represent a lipid poor adenoma. Metastatic disease not exclude. 5. Diffuse increased uptake throughout the bone marrow is favored to represent treatment related change. 6.  Aortic Atherosclerosis (ICD10-I70.0).     02/07/2022 Imaging    IMPRESSION: 1. No substantial change in hypermetabolism associated with the distal gastric lesion. Although difficult to discern on noncontrast CT imaging, the soft tissue component appears to be decreased since the  prior PET-CT. 2. Interval resolution of the hypermetabolic left supraclavicular lymph node. 3. Interval marked decrease of the hypermetabolic gastrohepatic ligament lymphadenopathy. Similar decrease in size and hypermetabolism associated with index nodes identified previously in the 4. porta hepatis and hypogastric region. 5. Stable left adrenal nodule with slight hypermetabolism. Nonspecific finding could represent lipid poor adenoma or metastatic deposit. 6. No new suspicious hypermetabolic disease on today's study. 7.  Aortic Atherosclerosis (ICD10-I70.0).   07/25/2022 -  Chemotherapy   Patient is on Treatment Plan : GASTROESOPHAGEAL Ramucirumab D1, 15 + Paclitaxel D1,8,15 q28d         REVIEW OF SYSTEMS:   Constitutional: Denies fevers, chills or abnormal weight loss. Does admit to decreased appetite and fatigue.  Eyes: Denies blurriness of vision Ears,  nose, mouth, throat, and face: Denies mucositis or sore throat Respiratory: Denies cough, dyspnea or wheezes Cardiovascular: Denies palpitation, chest discomfort or lower extremity swelling Gastrointestinal:  Denies nausea, heartburn or change in bowel habits Skin: Denies abnormal skin rashes Lymphatics: Denies new lymphadenopathy or easy bruising Neurological: patient reporting moderate neuropathy in feet and more significantly in his hands. Starting to interfere with his ability to work and participate in enjoyable and everyday activities.  Behavioral/Psych: Mood is stable, no new changes  All other systems were reviewed with the patient and are negative.   VITALS:   Today's Vitals   08/15/22 1026 08/15/22 1031  BP: 113/73   Pulse: 73   Resp: 18   Temp: 98.7 F (37.1 C)   TempSrc: Oral   SpO2: 100%   Weight: 174 lb (78.9 kg)   PainSc:  0-No pain   Body mass index is 28.96 kg/m.   Wt Readings from Last 3 Encounters:  08/15/22 174 lb (78.9 kg)  08/01/22 170 lb 3.2 oz (77.2 kg)  07/25/22 169 lb 4.8 oz (76.8 kg)    Body mass index is 28.96 kg/m.  Performance status (ECOG): 1 - Symptomatic but completely ambulatory  PHYSICAL EXAM:   GENERAL:alert, no distress and comfortable SKIN: skin color, texture, turgor are normal, no rashes or significant lesions EYES: normal, Conjunctiva are pink and non-injected, sclera clear OROPHARYNX:no exudate, no erythema and lips, buccal mucosa, and tongue normal  NECK: supple, thyroid normal size, non-tender, without nodularity LYMPH:  no palpable lymphadenopathy in the cervical, axillary or inguinal LUNGS: clear to auscultation and percussion with normal breathing effort HEART: regular rate & rhythm and no murmurs and no lower extremity edema ABDOMEN:abdomen soft, non-tender and normal bowel sounds Musculoskeletal:no cyanosis of digits and no clubbing  NEURO: alert & oriented x 3 with fluent speech, no focal motor/sensory  deficits  LABORATORY DATA:  I have reviewed the data as listed    Component Value Date/Time   NA 140 08/15/2022 0947   K 3.2 (L) 08/15/2022 0947   CL 108 08/15/2022 0947   CO2 26 08/15/2022 0947   GLUCOSE 96 08/15/2022 0947   BUN 11 08/15/2022 0947   CREATININE 0.79 08/15/2022 0947   CALCIUM 8.5 (L) 08/15/2022 0947   PROT 6.1 (L) 08/15/2022 0947   ALBUMIN 3.6 08/15/2022 0947   AST 15 08/15/2022 0947   ALT 15 08/15/2022 0947   ALKPHOS 89 08/15/2022 0947   BILITOT 0.3 08/15/2022 0947   GFRNONAA >60 08/15/2022 0947    Lab Results  Component Value Date   WBC 10.1 08/15/2022   NEUTROABS 5.1 08/15/2022   HGB 9.9 (L) 08/15/2022   HCT 29.7 (L) 08/15/2022   MCV 87.6 08/15/2022   PLT 239  08/15/2022   Addendum I have seen the patient, examined him. I agree with the assessment and and plan and have edited the notes.   Jonathan Maynard is tolerating chemotherapy well, but has developed a worsening neuropathy.  Will increase his Neurontin dose.  Due to the concern of significant impact of neuropathy on his function status, I recommended changing treatment to next line therapy.  I discussed option of FOLFIRI, [place]f,   Addendum Patient is clinically doing well, however his peripheral neuropathy has gotten worse since he started Taxol.  His hand function is slightly impacted by neuropathy.  He still wants to continue walking.  Due to the significant toxicity, I will reduce Taxol dose today, and switch his treatment to FOLFIRI.  Benefit and potential side effects discussed with patient and his wife in detail, especially risk of diarrhea, neutropenia, infection, dehydration, he voiced good understanding and agrees to proceed.  He understand the treatment is palliative to control his disease and prolong his life.  Plan to start in 2 weeks. I spent a total of 40 mins for his visit today and >50% on face-to-face counseling.  Malachy Mood MD 08/15/2022

## 2022-08-15 ENCOUNTER — Inpatient Hospital Stay: Payer: BC Managed Care – PPO

## 2022-08-15 ENCOUNTER — Other Ambulatory Visit (HOSPITAL_COMMUNITY): Payer: Self-pay

## 2022-08-15 ENCOUNTER — Other Ambulatory Visit: Payer: Self-pay

## 2022-08-15 ENCOUNTER — Inpatient Hospital Stay (HOSPITAL_BASED_OUTPATIENT_CLINIC_OR_DEPARTMENT_OTHER): Payer: BC Managed Care – PPO | Admitting: Nurse Practitioner

## 2022-08-15 ENCOUNTER — Inpatient Hospital Stay: Payer: BC Managed Care – PPO | Attending: Physician Assistant

## 2022-08-15 VITALS — BP 131/81 | HR 60 | Resp 16

## 2022-08-15 VITALS — BP 113/73 | HR 73 | Temp 98.7°F | Resp 18 | Wt 174.0 lb

## 2022-08-15 DIAGNOSIS — T451X5A Adverse effect of antineoplastic and immunosuppressive drugs, initial encounter: Secondary | ICD-10-CM | POA: Diagnosis not present

## 2022-08-15 DIAGNOSIS — R970 Elevated carcinoembryonic antigen [CEA]: Secondary | ICD-10-CM | POA: Insufficient documentation

## 2022-08-15 DIAGNOSIS — T451X5D Adverse effect of antineoplastic and immunosuppressive drugs, subsequent encounter: Secondary | ICD-10-CM | POA: Insufficient documentation

## 2022-08-15 DIAGNOSIS — G620 Drug-induced polyneuropathy: Secondary | ICD-10-CM

## 2022-08-15 DIAGNOSIS — C787 Secondary malignant neoplasm of liver and intrahepatic bile duct: Secondary | ICD-10-CM | POA: Diagnosis not present

## 2022-08-15 DIAGNOSIS — D500 Iron deficiency anemia secondary to blood loss (chronic): Secondary | ICD-10-CM | POA: Diagnosis not present

## 2022-08-15 DIAGNOSIS — Z79899 Other long term (current) drug therapy: Secondary | ICD-10-CM | POA: Insufficient documentation

## 2022-08-15 DIAGNOSIS — K5900 Constipation, unspecified: Secondary | ICD-10-CM | POA: Diagnosis not present

## 2022-08-15 DIAGNOSIS — Z5111 Encounter for antineoplastic chemotherapy: Secondary | ICD-10-CM | POA: Diagnosis present

## 2022-08-15 DIAGNOSIS — Z5112 Encounter for antineoplastic immunotherapy: Secondary | ICD-10-CM | POA: Insufficient documentation

## 2022-08-15 DIAGNOSIS — C163 Malignant neoplasm of pyloric antrum: Secondary | ICD-10-CM

## 2022-08-15 DIAGNOSIS — R110 Nausea: Secondary | ICD-10-CM | POA: Insufficient documentation

## 2022-08-15 DIAGNOSIS — Z95828 Presence of other vascular implants and grafts: Secondary | ICD-10-CM

## 2022-08-15 LAB — CBC WITH DIFFERENTIAL (CANCER CENTER ONLY)
Abs Immature Granulocytes: 0.29 10*3/uL — ABNORMAL HIGH (ref 0.00–0.07)
Basophils Absolute: 0.1 10*3/uL (ref 0.0–0.1)
Basophils Relative: 1 %
Eosinophils Absolute: 0.1 10*3/uL (ref 0.0–0.5)
Eosinophils Relative: 1 %
HCT: 29.7 % — ABNORMAL LOW (ref 39.0–52.0)
Hemoglobin: 9.9 g/dL — ABNORMAL LOW (ref 13.0–17.0)
Immature Granulocytes: 3 %
Lymphocytes Relative: 33 %
Lymphs Abs: 3.3 10*3/uL (ref 0.7–4.0)
MCH: 29.2 pg (ref 26.0–34.0)
MCHC: 33.3 g/dL (ref 30.0–36.0)
MCV: 87.6 fL (ref 80.0–100.0)
Monocytes Absolute: 1.3 10*3/uL — ABNORMAL HIGH (ref 0.1–1.0)
Monocytes Relative: 13 %
Neutro Abs: 5.1 10*3/uL (ref 1.7–7.7)
Neutrophils Relative %: 49 %
Platelet Count: 239 10*3/uL (ref 150–400)
RBC: 3.39 MIL/uL — ABNORMAL LOW (ref 4.22–5.81)
RDW: 16.3 % — ABNORMAL HIGH (ref 11.5–15.5)
WBC Count: 10.1 10*3/uL (ref 4.0–10.5)
nRBC: 0 % (ref 0.0–0.2)

## 2022-08-15 LAB — CMP (CANCER CENTER ONLY)
ALT: 15 U/L (ref 0–44)
AST: 15 U/L (ref 15–41)
Albumin: 3.6 g/dL (ref 3.5–5.0)
Alkaline Phosphatase: 89 U/L (ref 38–126)
Anion gap: 6 (ref 5–15)
BUN: 11 mg/dL (ref 6–20)
CO2: 26 mmol/L (ref 22–32)
Calcium: 8.5 mg/dL — ABNORMAL LOW (ref 8.9–10.3)
Chloride: 108 mmol/L (ref 98–111)
Creatinine: 0.79 mg/dL (ref 0.61–1.24)
GFR, Estimated: >60 mL/min (ref >60)
Glucose, Bld: 96 mg/dL (ref 70–99)
Potassium: 3.2 mmol/L — ABNORMAL LOW (ref 3.5–5.1)
Sodium: 140 mmol/L (ref 135–145)
Total Bilirubin: 0.3 mg/dL (ref 0.3–1.2)
Total Protein: 6.1 g/dL — ABNORMAL LOW (ref 6.5–8.1)

## 2022-08-15 LAB — CEA (IN HOUSE-CHCC): CEA (CHCC-In House): 1910.3 ng/mL — ABNORMAL HIGH (ref 0.00–5.00)

## 2022-08-15 MED ORDER — SODIUM CHLORIDE 0.9 % IV SOLN: Freq: Once | INTRAVENOUS | Status: AC

## 2022-08-15 MED ORDER — FAMOTIDINE IN NACL 20-0.9 MG/50ML-% IV SOLN
20.0000 mg | Freq: Once | INTRAVENOUS | Status: AC
  Administered 2022-08-15: 20 mg via INTRAVENOUS
  Filled 2022-08-15: qty 50

## 2022-08-15 MED ORDER — SODIUM CHLORIDE 0.9% FLUSH
10.0000 mL | INTRAVENOUS | Status: DC | PRN
  Administered 2022-08-15: 10 mL

## 2022-08-15 MED ORDER — SODIUM CHLORIDE 0.9 % IV SOLN
10.0000 mg | Freq: Once | INTRAVENOUS | Status: AC
  Administered 2022-08-15: 10 mg via INTRAVENOUS
  Filled 2022-08-15: qty 10

## 2022-08-15 MED ORDER — HEPARIN SOD (PORK) LOCK FLUSH 100 UNIT/ML IV SOLN
500.0000 [IU] | Freq: Once | INTRAVENOUS | Status: AC | PRN
  Administered 2022-08-15: 500 [IU]

## 2022-08-15 MED ORDER — DIPHENHYDRAMINE HCL 50 MG/ML IJ SOLN
50.0000 mg | Freq: Once | INTRAMUSCULAR | Status: AC
  Administered 2022-08-15: 50 mg via INTRAVENOUS
  Filled 2022-08-15: qty 1

## 2022-08-15 MED ORDER — SODIUM CHLORIDE 0.9 % IV SOLN
60.0000 mg/m2 | Freq: Once | INTRAVENOUS | Status: AC
  Administered 2022-08-15: 114 mg via INTRAVENOUS
  Filled 2022-08-15: qty 19

## 2022-08-15 MED ORDER — SODIUM CHLORIDE 0.9 % IV SOLN
8.0000 mg/kg | Freq: Once | INTRAVENOUS | Status: AC
  Administered 2022-08-15: 600 mg via INTRAVENOUS
  Filled 2022-08-15: qty 10

## 2022-08-15 MED ORDER — GABAPENTIN 100 MG PO CAPS
100.0000 mg | ORAL_CAPSULE | Freq: Four times a day (QID) | ORAL | 0 refills | Status: DC | PRN
Start: 2022-08-15 — End: 2022-11-06
  Filled 2022-08-15 (×2): qty 240, 30d supply, fill #0
  Filled 2022-08-25: qty 90, 12d supply, fill #0
  Filled 2022-10-20: qty 90, 12d supply, fill #1

## 2022-08-15 MED ORDER — SODIUM CHLORIDE 0.9% FLUSH
10.0000 mL | Freq: Once | INTRAVENOUS | Status: AC
  Administered 2022-08-15: 10 mL

## 2022-08-15 NOTE — Assessment & Plan Note (Signed)
-  From previous hemotherapy especially oxaliplatin -worsened since cycle 2 of new chemotherapy. Beginning to interfere with his work and life.  -increased dosing parameters for gabapentin. Will keep dose at 100 mg. He may take 1 to 2 capsules up to 4 times daily as needed. A new prescription was sent to [facility].  -patient to receive reduced dose paclitaxel and ramucirumab today.  -chemotherapy treatment to be changed for next infusion in 2 weeks.

## 2022-08-15 NOTE — Assessment & Plan Note (Addendum)
cT3N3Mx, with hypermetabolic left supraclavicular node, and indeterminate adrenal nodule, MMR normal ypT3ypN3a -diagnosed 10/18/21 by EGD for 6 month f/u of gastric ulcers, showed mucosal intramucosal adenocarcinoma. MMR normal. -baseline CEA 11/01/21 significantly elevated at 1,989.58. -EUS on 11/06/21 by Dr. Dulce Sellar, staged as T3 N3 (at least stage III) -staging CT CAP 11/08/21 showed: stable gastric antrum wall thickening; increased size of upper abdominal lymph nodes; stable left adrenal nodule, 1.9 cm. -PET scan showed FDG avid lymph nodes within the gastrohepatic ligament, portacaval region, and mesentery. Tracer avid left supraclavicular lymph node and indeterminate left adrenal gland nodule with mild FDG uptake. -he underwent [place] node biopsy on 1/17 which was negative for malignant cells, but no lymphoid tissue seen on biopsy  -repeated PET on 02/07/22 after 3 months neoadjuvant chemo showed resolved left supraclavicular lymph node, and significant improvement in regional lymph nodes and the primary tumor, stable and indeterminate left adrenal nodule.  -CT adrenal protocol showed the left adrenal nodule is likely benign adenoma, no biopsy is needed. -Completed neoadjvant chemo FLOT s/p 12 cycles 11/22/21 - 04/27/2022 -restaging PET on 05/16/22 showed significantly hypermetabolic activity at the primary gastric cancer in pylorus, and a small hypermetabolic mesenteric lymph node, no other evidence of metastasis. surgeon Dr. Sheliah Hatch aware of PET findings -S/p distal gastrectomy 05/20/22, path showed ypT3N3a with clear margins, 7/18 + LNs, and lymphovascular invasion identified. There was some treatment effect in a few lymph nodes nut not in the primary tumor -due to rising tumor marker, he underwent PET scan on July 07, 2022, which unfortunately showed hypermetabolic new adenopathy in right hilar and mesentery, diffuse liver metastasis, consistent with metastatic recurrence.  I personally reviewed the PET  scan images with patient -Unfortunately his disease is not curable at this stage. -I recommend second line chemotherapy with paclitaxel and ramucirumab --Chemotherapy consent: Side effects including but does not not limited to, fatigue, nausea, vomiting, diarrhea, hair loss, neuropathy, fluid retention, renal and kidney dysfunction, neutropenic fever, needed for blood transfusion, bleeding, were discussed with patient in great detail. He agrees to proceed. -The goal of chemotherapy is palliative, to prolong his life -I discussed his next January sequencing foundation one result, which showed EGFR amplification, no other targetable mutations.  His tumor was previously tested for HER2 which was negative (0-1+).  PD-L1 CPS score was 2%, no significant benefit of immunotherapy.  -He started treatment on 07/25/2022  Due to his pre-existing neuropathy, will give paclitaxel and ramucirumab every 2 weeks after today's treatment  08/15/2022 - he is scheduled for Cycle 3 today  -he is reporting a significant increase in neuropathy since his last visit. This is mostly effecting his hands. They feel swollen and they throb. Sometimes, the hands are hard to move. His feet hurt when he stands or walks for too long. He is currently taking gabapentin 100mg  up to four times daily. He states that neuropathy is worse In the mornings. He does use his hands a lot at work. Neuropathy is beginning to negatively impact his work.  -states that he does have decreased taste sensation. Makes him not feel like eating. Eats anyway because he know he has to. Drinking electrolyte drinks. States that he does get some nausea for which he takes prescribed medications. Tries to stay ahead of the nausea with medication. States that his bowels move "ok." Gets very mild constipation intermittently.  -denies fever, chills, or unusual night sweats. He reports some fatigue which is more severe the first few days after chemotherapy.  -he  continues to  work full time. -he did bring from from [facility] for handicap parking pass. He states that neuropathy in his feet is getting worse and standing or walking for long periods does make this worse. Form was completed during his visit and returned to him. Application is approved for 6 months.  -due to severity of neuropathy, patient will receive reduced dose paclitaxel and ramucirumab. Treatment to be changed for next infusion in 2 weeks.

## 2022-08-15 NOTE — Patient Instructions (Signed)
[facility]  Discharge Instructions: Thank you for choosing [facility] to provide your oncology and hematology care.   If you have a lab appointment with the Cancer Center, please go directly to the Cancer Center and check in at the registration area.   Wear comfortable clothing and clothing appropriate for easy access to any Portacath or PICC line.   We strive to give you quality time with your provider. You may need to reschedule your appointment if you arrive late (15 or more minutes).  Arriving late affects you and other patients whose appointments are after yours.  Also, if you miss three or more appointments without notifying the office, you may be dismissed from the clinic at the provider's discretion.      For prescription refill requests, have your pharmacy contact our office and allow 72 hours for refills to be completed.    Today you received the following chemotherapy and/or immunotherapy agents: Cyramza/Taxol      To help prevent nausea and vomiting after your treatment, we encourage you to take your nausea medication as directed.  BELOW ARE SYMPTOMS THAT SHOULD BE REPORTED IMMEDIATELY: *FEVER GREATER THAN 100.4 F (38 C) OR HIGHER *CHILLS OR SWEATING *NAUSEA AND VOMITING THAT IS NOT CONTROLLED WITH YOUR NAUSEA MEDICATION *UNUSUAL SHORTNESS OF BREATH *UNUSUAL BRUISING OR BLEEDING *URINARY PROBLEMS (pain or burning when urinating, or frequent urination) *BOWEL PROBLEMS (unusual diarrhea, constipation, pain near the anus) TENDERNESS IN MOUTH AND THROAT WITH OR WITHOUT PRESENCE OF ULCERS (sore throat, sores in mouth, or a toothache) UNUSUAL RASH, SWELLING OR PAIN  UNUSUAL VAGINAL DISCHARGE OR ITCHING   Items with * indicate a potential emergency and should be followed up as soon as possible or go to the Emergency Department if any problems should occur.  Please show the CHEMOTHERAPY ALERT CARD or IMMUNOTHERAPY ALERT CARD at  check-in to the Emergency Department and triage nurse.  Should you have questions after your visit or need to cancel or reschedule your appointment, please contact [facility]  Dept: [phone]  and follow the prompts.  Office hours are 8:00 a.m. to 4:30 p.m. Monday - Friday. Please note that voicemails left after 4:00 p.m. may not be returned until the following business day.  We are closed weekends and major holidays. You have access to a nurse at all times for urgent questions. Please call the main number to the clinic Dept: [phone] and follow the prompts.   For any non-urgent questions, you may also contact your provider using MyChart. We now offer e-Visits for anyone 97 and older to request care online for non-urgent symptoms. For details visit [email or web address].   Also download the MyChart app! Go to the app store, search "MyChart", open the app, select , and log in with your MyChart username and password.

## 2022-08-15 NOTE — Progress Notes (Signed)
Ok to give cyramza without urine protein per MD Dinah Beers, PharmD

## 2022-08-15 NOTE — Assessment & Plan Note (Signed)
Hgb and Hct slightly lower today than previous lab checks. Encouraged patient to restart oral iron.

## 2022-08-20 ENCOUNTER — Encounter: Payer: Self-pay | Admitting: Hematology

## 2022-08-20 ENCOUNTER — Encounter: Payer: Self-pay | Admitting: Physician Assistant

## 2022-08-20 NOTE — Progress Notes (Addendum)
DISCONTINUE ON PATHWAY REGIMEN - Gastroesophageal     A cycle is every 28 days:     Ramucirumab      Paclitaxel   **Always confirm dose/schedule in your pharmacy ordering system**  REASON: Toxicities / Adverse Event PRIOR TREATMENT: GEOS14: Ramucirumab 8 mg/kg Days 1, 15 + Paclitaxel 80 mg/m2 Days 1, 8, 15 q28 Days Until Progression or Unacceptable Toxicity TREATMENT RESPONSE: Unable to Evaluate  START OFF PATHWAY REGIMEN - Gastroesophageal   [id number] (Leucovorin IV D1 + Fluorouracil IV D1/CIV D1,2 + Irinotecan IV D1) q14 Days:   A cycle is every 14 days:     Irinotecan      Leucovorin      Fluorouracil      Fluorouracil   **Always confirm dose/schedule in your pharmacy ordering system**  Patient Characteristics: Distant Metastases (cM1/pM1) / Locally Recurrent Disease, Adenocarcinoma - Esophageal, GE Junction, and Gastric, Third Line and Beyond, HER2 Negative/Unknown and MSS/pMMR or MSI Unknown Therapeutic Status: Distant Metastases (No Additional Staging) Histology: Adenocarcinoma Disease Classification: Gastric Line of Therapy: 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

## 2022-08-21 ENCOUNTER — Other Ambulatory Visit: Payer: Self-pay

## 2022-08-22 ENCOUNTER — Other Ambulatory Visit: Payer: BC Managed Care – PPO

## 2022-08-22 ENCOUNTER — Ambulatory Visit: Payer: BC Managed Care – PPO | Admitting: Physician Assistant

## 2022-08-22 ENCOUNTER — Ambulatory Visit: Payer: BC Managed Care – PPO

## 2022-08-24 ENCOUNTER — Other Ambulatory Visit: Payer: Self-pay

## 2022-08-25 ENCOUNTER — Other Ambulatory Visit: Payer: Self-pay

## 2022-08-25 ENCOUNTER — Other Ambulatory Visit (HOSPITAL_COMMUNITY): Payer: Self-pay

## 2022-08-27 ENCOUNTER — Other Ambulatory Visit: Payer: Self-pay

## 2022-08-28 ENCOUNTER — Ambulatory Visit: Payer: BC Managed Care – PPO | Admitting: Hematology

## 2022-08-28 ENCOUNTER — Ambulatory Visit: Payer: BC Managed Care – PPO | Admitting: Nurse Practitioner

## 2022-08-28 ENCOUNTER — Other Ambulatory Visit: Payer: BC Managed Care – PPO

## 2022-08-28 ENCOUNTER — Ambulatory Visit: Payer: BC Managed Care – PPO

## 2022-08-29 ENCOUNTER — Telehealth: Payer: Self-pay

## 2022-08-29 ENCOUNTER — Inpatient Hospital Stay: Payer: BC Managed Care – PPO

## 2022-08-29 ENCOUNTER — Inpatient Hospital Stay (HOSPITAL_BASED_OUTPATIENT_CLINIC_OR_DEPARTMENT_OTHER): Payer: BC Managed Care – PPO | Admitting: Nurse Practitioner

## 2022-08-29 ENCOUNTER — Other Ambulatory Visit (HOSPITAL_COMMUNITY): Payer: Self-pay

## 2022-08-29 VITALS — BP 134/86 | HR 70 | Temp 97.9°F | Resp 17 | Ht 65.0 in | Wt 175.1 lb

## 2022-08-29 DIAGNOSIS — C163 Malignant neoplasm of pyloric antrum: Secondary | ICD-10-CM

## 2022-08-29 DIAGNOSIS — Z5112 Encounter for antineoplastic immunotherapy: Secondary | ICD-10-CM | POA: Diagnosis not present

## 2022-08-29 DIAGNOSIS — Z95828 Presence of other vascular implants and grafts: Secondary | ICD-10-CM

## 2022-08-29 LAB — CMP (CANCER CENTER ONLY)
ALT: 8 U/L (ref 0–44)
AST: 16 U/L (ref 15–41)
Albumin: 3.6 g/dL (ref 3.5–5.0)
Alkaline Phosphatase: 102 U/L (ref 38–126)
Anion gap: 6 (ref 5–15)
BUN: 7 mg/dL (ref 6–20)
CO2: 27 mmol/L (ref 22–32)
Calcium: 8.9 mg/dL (ref 8.9–10.3)
Chloride: 110 mmol/L (ref 98–111)
Creatinine: 0.7 mg/dL (ref 0.61–1.24)
GFR, Estimated: >60 mL/min (ref >60)
Glucose, Bld: 123 mg/dL — ABNORMAL HIGH (ref 70–99)
Potassium: 3.7 mmol/L (ref 3.5–5.1)
Sodium: 143 mmol/L (ref 135–145)
Total Bilirubin: 0.4 mg/dL (ref 0.3–1.2)
Total Protein: 6.6 g/dL (ref 6.5–8.1)

## 2022-08-29 LAB — CBC WITH DIFFERENTIAL (CANCER CENTER ONLY)
Abs Immature Granulocytes: 0.03 10*3/uL (ref 0.00–0.07)
Basophils Absolute: 0 10*3/uL (ref 0.0–0.1)
Basophils Relative: 1 %
Eosinophils Absolute: 0.1 10*3/uL (ref 0.0–0.5)
Eosinophils Relative: 1 %
HCT: 32.7 % — ABNORMAL LOW (ref 39.0–52.0)
Hemoglobin: 10.7 g/dL — ABNORMAL LOW (ref 13.0–17.0)
Immature Granulocytes: 1 %
Lymphocytes Relative: 37 %
Lymphs Abs: 2.2 10*3/uL (ref 0.7–4.0)
MCH: 29 pg (ref 26.0–34.0)
MCHC: 32.7 g/dL (ref 30.0–36.0)
MCV: 88.6 fL (ref 80.0–100.0)
Monocytes Absolute: 0.5 10*3/uL (ref 0.1–1.0)
Monocytes Relative: 8 %
Neutro Abs: 3.2 10*3/uL (ref 1.7–7.7)
Neutrophils Relative %: 52 %
Platelet Count: 252 10*3/uL (ref 150–400)
RBC: 3.69 MIL/uL — ABNORMAL LOW (ref 4.22–5.81)
RDW: 16.3 % — ABNORMAL HIGH (ref 11.5–15.5)
WBC Count: 6.1 10*3/uL (ref 4.0–10.5)
nRBC: 0 % (ref 0.0–0.2)

## 2022-08-29 MED ORDER — SODIUM CHLORIDE 0.9% FLUSH: 10.0000 mL | Freq: Once | INTRAVENOUS | Status: DC

## 2022-08-29 MED ORDER — ATROPINE SULFATE 1 MG/ML IV SOLN
0.5000 mg | Freq: Once | INTRAVENOUS | Status: DC | PRN
  Filled 2022-08-29: qty 1

## 2022-08-29 MED ORDER — SODIUM CHLORIDE 0.9 % IV SOLN
2400.0000 mg/m2 | INTRAVENOUS | Status: DC
  Administered 2022-08-29: 5000 mg via INTRAVENOUS
  Filled 2022-08-29: qty 100

## 2022-08-29 MED ORDER — SODIUM CHLORIDE 0.9 % IV SOLN
2400.0000 mg/m2 | INTRAVENOUS | Status: DC
  Filled 2022-08-29: qty 100

## 2022-08-29 MED ORDER — DEXAMETHASONE 4 MG PO TABS: 8.0000 mg | ORAL_TABLET | Freq: Every day | ORAL | 1 refills | Status: DC

## 2022-08-29 MED ORDER — SODIUM CHLORIDE 0.9% FLUSH: 10.0000 mL | INTRAVENOUS | Status: DC | PRN

## 2022-08-29 MED ORDER — SODIUM CHLORIDE 0.9 % IV SOLN: Freq: Once | INTRAVENOUS | Status: AC

## 2022-08-29 MED ORDER — SODIUM CHLORIDE 0.9 % IV SOLN
10.0000 mg | Freq: Once | INTRAVENOUS | Status: AC
  Administered 2022-08-29: 10 mg via INTRAVENOUS
  Filled 2022-08-29: qty 10

## 2022-08-29 MED ORDER — HEPARIN SOD (PORK) LOCK FLUSH 100 UNIT/ML IV SOLN: 500.0000 [IU] | Freq: Once | INTRAVENOUS | Status: DC | PRN

## 2022-08-29 MED ORDER — SODIUM CHLORIDE 0.9% FLUSH
10.0000 mL | Freq: Once | INTRAVENOUS | Status: AC
  Administered 2022-08-29: 10 mL

## 2022-08-29 MED ORDER — PALONOSETRON HCL INJECTION 0.25 MG/5ML
0.2500 mg | Freq: Once | INTRAVENOUS | Status: AC
  Administered 2022-08-29: 0.25 mg via INTRAVENOUS
  Filled 2022-08-29: qty 5

## 2022-08-29 MED ORDER — SODIUM CHLORIDE 0.9 % IV SOLN
400.0000 mg/m2 | Freq: Once | INTRAVENOUS | Status: AC
  Administered 2022-08-29: 760 mg via INTRAVENOUS
  Filled 2022-08-29: qty 25

## 2022-08-29 MED ORDER — SODIUM CHLORIDE 0.9 % IV SOLN
180.0000 mg/m2 | Freq: Once | INTRAVENOUS | Status: AC
  Administered 2022-08-29: 340 mg via INTRAVENOUS
  Filled 2022-08-29: qty 2

## 2022-08-29 NOTE — Patient Instructions (Signed)
[facility]  Discharge Instructions: Thank you for choosing [facility] to provide your oncology and hematology care.   If you have a lab appointment with the [facility], please go directly to the [facility] and check in at the registration area.   Wear comfortable clothing and clothing appropriate for easy access to any Portacath or PICC line.   We strive to give you quality time with your provider. You may need to reschedule your appointment if you arrive late (15 or more minutes).  Arriving late affects you and other patients whose appointments are after yours.  Also, if you miss three or more appointments without notifying the office, you may be dismissed from the clinic at the provider's discretion.      For prescription refill requests, have your pharmacy contact our office and allow 72 hours for refills to be completed.    Today you received the following chemotherapy and/or immunotherapy agents: Irinotecan, Leucovorin, Fluorouracil.       To help prevent nausea and vomiting after your treatment, we encourage you to take your nausea medication as directed.  BELOW ARE SYMPTOMS THAT SHOULD BE REPORTED IMMEDIATELY: *FEVER GREATER THAN 100.4 F (38 C) OR HIGHER *CHILLS OR SWEATING *NAUSEA AND VOMITING THAT IS NOT CONTROLLED WITH YOUR NAUSEA MEDICATION *UNUSUAL SHORTNESS OF BREATH *UNUSUAL BRUISING OR BLEEDING *URINARY PROBLEMS (pain or burning when urinating, or frequent urination) *BOWEL PROBLEMS (unusual diarrhea, constipation, pain near the anus) TENDERNESS IN MOUTH AND THROAT WITH OR WITHOUT PRESENCE OF ULCERS (sore throat, sores in mouth, or a toothache) UNUSUAL RASH, SWELLING OR PAIN  UNUSUAL VAGINAL DISCHARGE OR ITCHING   Items with * indicate a potential emergency and should be followed up as soon as possible or go to the Emergency Department if any problems should occur.  Please show the CHEMOTHERAPY ALERT CARD or  IMMUNOTHERAPY ALERT CARD at check-in to the Emergency Department and triage nurse.  Should you have questions after your visit or need to cancel or reschedule your appointment, please contact [facility]  Dept: [phone]  and follow the prompts.  Office hours are 8:00 a.m. to 4:30 p.m. Monday - Friday. Please note that voicemails left after 4:00 p.m. may not be returned until the following business day.  We are closed weekends and major holidays. You have access to a nurse at all times for urgent questions. Please call the main number to the clinic Dept: [phone] and follow the prompts.   For any non-urgent questions, you may also contact your provider using MyChart. We now offer e-Visits for anyone 25 and older to request care online for non-urgent symptoms. For details visit [email or web address].   Also download the MyChart app! Go to the app store, search "MyChart", open the app, select [name], and log in with your MyChart username and password.

## 2022-08-29 NOTE — Progress Notes (Unsigned)
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 MMR normal. -baseline CEA 11/01/21 significantly elevated at 1,989.58. -EUS on 11/06/21 by Dr. Dulce Sellar, staged as T3 N3 (at least stage III) -staging CT CAP 11/08/21 showed: stable gastric antrum wall thickening; increased size of upper abdominal lymph nodes; stable left adrenal nodule, 1.9 cm. -PET scan showed FDG avid lymph nodes within the gastrohepatic ligament, portacaval region, and mesentery. Tracer avid left supraclavicular lymph node and indeterminate left adrenal gland nodule with mild FDG uptake. -he underwent [place] node biopsy on 1/17 which was negative for malignant cells, but no lymphoid tissue seen on biopsy  -repeated PET on 02/07/22 after 3 months neoadjuvant chemo showed resolved left supraclavicular lymph node, and significant improvement in regional lymph nodes and the primary tumor, stable and indeterminate left adrenal nodule.  -CT adrenal protocol showed the left adrenal nodule is likely benign adenoma, no biopsy is needed. -Completed neoadjvant chemo FLOT s/p 12 cycles 11/22/21 - 04/27/2022 -restaging PET on 05/16/22 showed significantly hypermetabolic activity at the primary gastric cancer in pylorus, and a small hypermetabolic mesenteric lymph node, no other evidence of metastasis. surgeon Dr. Sheliah Hatch aware of PET findings -S/p distal gastrectomy 05/20/22, path showed ypT3N3a with clear margins, 7/18 + LNs, and lymphovascular invasion identified. There was some treatment effect in a few lymph nodes nut not in the primary tumor -due to  rising tumor marker, he underwent PET scan on July 07, 2022, which unfortunately showed hypermetabolic new adenopathy in right hilar and mesentery, diffuse liver metastasis, consistent with metastatic recurrence.  I personally reviewed the PET scan images with patient -Unfortunately his disease is not curable at this stage. -I recommend second line chemotherapy with paclitaxel and ramucirumab --Chemotherapy consent: Side effects including but does not not limited to, fatigue, nausea, vomiting, diarrhea, hair loss, neuropathy, fluid retention, renal and kidney dysfunction, neutropenic fever, needed for blood transfusion, bleeding, were discussed with patient in great detail. He agrees to proceed. -The goal of chemotherapy is palliative, to prolong his life -I discussed his next January sequencing foundation one result, which showed EGFR amplification, no other targetable mutations.  His tumor was previously tested for HER2 which was negative (0-1+).  PD-L1 CPS score was 2%, no significant benefit of immunotherapy.  -He started treatment on 07/25/2022  Due to his pre-existing neuropathy, will give paclitaxel and ramucirumab every 2 weeks after today's treatment  08/15/2022 - he is scheduled for Cycle 3 today  -he is reporting a significant increase in neuropathy since his last visit. This is mostly effecting his hands. They feel swollen and they throb. Sometimes, the hands are hard to move. His feet hurt when he stands or walks for too long. He is currently taking gabapentin 100mg  up to four times daily. He states that neuropathy is worse In the mornings. He does use his hands a lot at work. Neuropathy is beginning to negatively impact his work.  -states that he does have decreased taste sensation. Makes him not feel like eating. Eats anyway because he know he has to. Drinking electrolyte drinks. States that he does get some nausea for which he takes prescribed medications. Tries to stay  ahead of the nausea with  medication. States that his bowels move "ok." Gets very mild constipation intermittently.  -denies fever, chills, or unusual night sweats. He reports some fatigue which is more severe the first few days after chemotherapy.  -he continues to work full time. 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  Labs reviewed  -CBC showing WBC 6.1; Hgb 10.7; Hct 32.7; Plt 252; Anc 3.2 -CMP - K 3.7; glucose 123; BUN 7; Creatinine 0.7; eGFR > 60; Ca 8.5; LFTs normal.   He states that he has new pain and stiffness in hips and upper thighs. This is intermittent. He takes tylenol 500 mg for this which is helpful sometimes. Has been afraid to take with gabapentin.  He denies chest pain, chest pressure, or shortness of breath. He denies headaches or visual disturbances. He denies abdominal pain, nausea, vomiting, or changes in bowel or bladder habits.   Proceed to treatment Cycle 1 day 1.  Advised him to try OTC tylenol arthritis strength which is 650 mg, up to three times daily if needed for pain. Advised it is OK to alternate with gabapentin use.  Labs/flush, follow up, and chemotherapy every 2 weeks.    Plan: Labs reviewed  -CBC showing WBC 6.1; Hgb 10.7; Hct 32.7; Plt 252; Anc 3.2 -CMP - K 3.7; glucose 123; BUN 7; Creatinine 0.7; eGFR > 60; Ca 8.5; LFTs normal.   He states that he has new pain and stiffness in hips and upper thighs. This is intermittent. He takes tylenol 500 mg for this which is helpful sometimes. Has been afraid to take with gabapentin.  Advised him to try OTC tylenol arthritis strength which is 650 mg, up to three times daily if needed for pain. Advised it is OK to alternate with gabapentin use.  Proceed to Cycle 1 Day 1 COLORECTAL FOLFIRI Labs/flush, follow up, and chemotherapy every 2 weeks.   The patient understands the plans discussed today and is in agreement with them.  He knows to contact our office if he  develops concerns prior to his next appointment.  I provided 30 minutes of face-to-face time during this encounter and > 50% was spent counseling as documented under my assessment and plan.    Carlean Jews, NP  [facility] [address] [place] [address] Dept: [phone] Dept Fax: [phone]   No orders of the defined types were placed in this encounter.     CHIEF COMPLAINT:  CC: Gastric Cancer   Current Treatment:  COLORECTAL FOLFIRI  INTERVAL HISTORY:  Kanin is here today for repeat clinical assessment. He denies fevers or chills. He denies pain. His appetite is good. His weight has been stable.  -he is reporting a significant increase in neuropathy since his last visit. This is mostly effecting his hands. They feel swollen and they throb. Sometimes, the hands are hard to move. His feet hurt when he stands or walks for too long. He is currently taking gabapentin 100mg  up to four times daily. He states that neuropathy is worse In the mornings. He does use his hands a lot at work. Neuropathy is beginning to negatively impact his work.  -states that he does have decreased taste sensation. Makes him not feel like eating. Eats anyway because he know he has to. Drinking electrolyte drinks. States that he does get some nausea for which he takes  prescribed medications. Tries to stay ahead of the nausea with medication. States that his bowels move "ok." Gets very mild constipation intermittently.  -denies fever, chills, or unusual night sweats. He reports some fatigue which is more severe the first few days after chemotherapy.  -he continues to work full time. 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  Labs reviewed  -CBC showing WBC 6.1; Hgb 10.7; Hct 32.7; Plt 252; Anc 3.2 -CMP - K 3.7; glucose 123; BUN 7; Creatinine 0.7;  eGFR > 60; Ca 8.5; LFTs normal.   He states that he has new pain and stiffness in hips and upper thighs. This is intermittent. He takes tylenol 500 mg for this which is helpful sometimes. Has been afraid to take with gabapentin.  He denies chest pain, chest pressure, or shortness of breath. He denies headaches or visual disturbances. He denies abdominal pain, nausea, vomiting, or changes in bowel or bladder habits.   Proceed to treatment Cycle 1 day 1.  Advised him to try OTC tylenol arthritis strength which is 650 mg, up to three times daily if needed for pain. Advised it is OK to alternate with gabapentin use.  Labs/flush, follow up, and chemotherapy every 2 weeks.    I have reviewed the past medical history, past surgical history, social history and family history with the patient and they are unchanged from previous note.  ALLERGIES:  has No Known Allergies.  MEDICATIONS:  Current Outpatient Medications  Medication Sig Dispense Refill   acetaminophen (TYLENOL) 500 MG tablet Take 500 mg by mouth 2 (two) times daily.     gabapentin (NEURONTIN) 100 MG capsule Take 1-2 capsules (100-200 mg total) by mouth 4 (four) times daily as needed. OK to increase to 2 or 3 capsules in a few weeks if tolerates well 240 capsule 0   magic mouthwash (lidocaine, diphenhydrAMINE, alum & mag hydroxide) suspension Swish and spit 15 mLs 3 (three) times daily. (Patient taking differently: Swish and spit 15 mLs 3 (three) times daily as needed.) 100 mL 0   ondansetron (ZOFRAN-ODT) 4 MG disintegrating tablet Dissolve 1 tablet (4 mg total) by mouth every 6 (six) hours as needed for nausea or vomiting. 20 tablet 0   oxyCODONE (OXY IR/ROXICODONE) 5 MG immediate release tablet Take 1 tablet (5 mg total) by mouth every 6 (six) hours as needed for severe pain. 15 tablet 0   pantoprazole (PROTONIX) 40 MG tablet Take 1 tablet (40 mg) by mouth 2 times daily. 60 tablet 6   No current facility-administered medications for this  visit.   Facility-Administered Medications Ordered in Other Visits  Medication Dose Route Frequency Provider Last Rate Last Admin   atropine injection 0.5 mg  0.5 mg Intravenous Once PRN Malachy Mood, MD       fluorouracil (ADRUCIL) 5,000 mg in sodium chloride 0.9 % 150 mL chemo infusion  2,400 mg/m2 (Treatment Plan Recorded) Intravenous 1 day or 1 dose Malachy Mood, MD   Infusion Verify at 08/29/22 1430   heparin lock flush 100 unit/mL  500 Units Intracatheter Once PRN Malachy Mood, MD       sodium chloride flush (NS) 0.9 % injection 10 mL  10 mL Intracatheter PRN Malachy Mood, MD        HISTORY OF PRESENT ILLNESS:   Oncology History Overview Note   Cancer Staging  Gastric cancer [facility]) Staging form: Stomach, AJCC 8th Edition - Clinical stage from 10/18/2021: Stage III (  cT3, cN3a, cM0) - Signed by Malachy Mood, MD on 11/10/2021     Gastric cancer (HCC)  10/18/2021 Procedure   EGD performed under the care of Dr. Bosie Clos  Findings:  -A large, ulcerated, partially circumferential (involving two thirds of the lumen circumference) mass with no bleeding and stigmata of recent bleeding was found in the gastric antrum, at the pylorus and in the prepyloric region of the stomach. -Segmental severe inflammation characterized by congestion (edema) and erythema was found in the gastric antrum.   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 Negative for dysplasia.  Negative for helicobacter pylori  Mismatch Repair (MMR) Protein Imunohistochemistry (IHC): IHC Expression Result: MLH1:  Preserved nuclear expression. MSH2: Preserved nuclear expression. MSH6: Preserved nuclear expression. PMS2: Preserved nuclear expression. Interpretation: NORMAL   10/31/2021 Initial Diagnosis   Gastric cancer (HCC)   11/01/2021 Tumor Marker   CEA = 1,027.25 (^)   11/06/2021 Procedure   Upper EUS, Dr. Dulce Sellar  Impression: - Normal esophagus. - A small amount of food (residue) in the stomach. - Congested, friable (with contact bleeding) and ulcerated mucosa in the prepyloric region of the stomach. - Normal examined duodenum. - There was no evidence of significant pathology in the left lobe of the liver. - Many abnormal lymph nodes (over 10) were visualized in the gastrohepatic ligament (level 18), celiac region (level 20), perigastric region, peripancreatic region and aortocaval region. - Wall thickening was seen in the prepyloric region of the stomach. The thickening appeared to be primarily within the deep mucosa (Layer 2) but extended through the muscularis propria. - Overall constellation of findings consistent with at least T3 N3 Mx (at least stage III) gastric adenocarcinoma.   11/08/2021 Imaging   EXAM: CT CHEST, ABDOMEN, AND PELVIS WITH CONTRAST  IMPRESSION: 1. Similar irregular wall thickening of the gastric antrum, consistent with patient's known primary gastric neoplasm. 2. Increased size of the upper abdominal lymph nodes, concerning for worsening nodal disease involvement. 3. Stable left adrenal nodule common nonspecific possibly a metastatic lesion or adenoma. 4. No evidence of metastatic disease in the chest. 5. Left-sided colonic diverticulosis without findings of acute diverticulitis. 6. Enlarged prostate gland. 7.  Aortic Atherosclerosis (ICD10-I70.0).   11/22/2021 - 04/26/2022 Chemotherapy   Patient is on Treatment Plan : GASTROESOPHAGEAL FLOT q14d X 4 cycles     11/27/2021 Imaging    IMPRESSION: 1. The mass within the gastric antrum is mildly decreased in  size when compared with 11/08/2021. There is persistent FDG uptake associated with this mass compatible with residual tumor. 2. Persistent FDG avid lymph nodes within the gastrohepatic ligament, portacaval region, and mesentery. These are mildly decreased in size when compared with 11/08/2021. 3. Tracer avid left supraclavicular lymph node is also mildly decreased in size when compared with 11/08/2021. 4. Indeterminate left adrenal gland nodule with mild FDG uptake. This may represent a lipid poor adenoma. Metastatic disease not exclude. 5. Diffuse increased uptake throughout the bone marrow is favored to represent treatment related change. 6.  Aortic Atherosclerosis (ICD10-I70.0).     02/07/2022 Imaging    IMPRESSION: 1. No substantial change in hypermetabolism associated with the distal gastric  lesion. Although difficult to discern on noncontrast CT imaging, the soft tissue component appears to be decreased since the prior PET-CT. 2. Interval resolution of the hypermetabolic left supraclavicular lymph node. 3. Interval marked decrease of the hypermetabolic gastrohepatic ligament lymphadenopathy. Similar decrease in size and hypermetabolism associated with index nodes identified previously in the 4. porta hepatis and hypogastric region. 5. Stable left adrenal nodule with slight hypermetabolism. Nonspecific finding could represent lipid poor adenoma or metastatic deposit. 6. No new suspicious hypermetabolic disease on today's study. 7.  Aortic Atherosclerosis (ICD10-I70.0).   07/25/2022 - 08/15/2022 Chemotherapy   Patient is on Treatment Plan : GASTROESOPHAGEAL Ramucirumab D1, 15 + Paclitaxel D1,8,15 q28d     08/29/2022 -  Chemotherapy   Patient is on Treatment Plan : COLORECTAL FOLFIRI q14d         REVIEW OF SYSTEMS:   Constitutional: Denies fevers, chills or abnormal weight loss Eyes: Denies blurriness of vision Ears, nose, mouth, throat, and face: Denies mucositis or  sore throat Respiratory: Denies cough, dyspnea or wheezes Cardiovascular: Denies palpitation, chest discomfort or lower extremity swelling Gastrointestinal:  Denies nausea, heartburn or change in bowel habits Skin: Denies abnormal skin rashes Lymphatics: Denies new lymphadenopathy or easy bruising Neurological: patient having moderate to severe neuropathy in the fingers of both hands. Grip strength slightly reduced due to neuropathy.  Musculoskeletal: increased pain in the hips and thighs Behavioral/Psych: Mood is stable, no new changes  All other systems were reviewed with the patient and are negative.   VITALS:   Today's Vitals   08/29/22 1006 08/29/22 1007 08/29/22 1013  BP: (!) 142/85 134/86   Pulse: 70    Resp: 17    Temp: 97.9 F (36.6 C)    TempSrc: Oral    SpO2: 100%    Weight: 175 lb 1.6 oz (79.4 kg)    Height: 5\' 5"  (1.651 m)    PainSc:   7    Body mass index is 29.14 kg/m.    Wt Readings from Last 3 Encounters:  08/29/22 175 lb 1.6 oz (79.4 kg)  08/15/22 174 lb (78.9 kg)  08/01/22 170 lb 3.2 oz (77.2 kg)    Body mass index is 29.14 kg/m.  Performance status (ECOG): 1 - Symptomatic but completely ambulatory  PHYSICAL EXAM:   GENERAL:alert, no distress and comfortable SKIN: skin color, texture, turgor are normal, no rashes or significant lesions EYES: normal, Conjunctiva are pink and non-injected, sclera clear OROPHARYNX:no exudate, no erythema and lips, buccal mucosa, and tongue normal  NECK: supple, thyroid normal size, non-tender, without nodularity LYMPH:  no palpable lymphadenopathy in the cervical, axillary or inguinal LUNGS: clear to auscultation and percussion with normal breathing effort HEART: regular rate & rhythm and no murmurs and no lower extremity edema ABDOMEN:abdomen soft, non-tender and normal bowel sounds Musculoskeletal:no cyanosis of digits and no clubbing  NEURO: alert & oriented x 3 with fluent speech, no focal motor/sensory  deficits  LABORATORY DATA:  I have reviewed the data as listed    Component Value Date/Time   NA 143 08/29/2022 0931   K 3.7 08/29/2022 0931   CL 110 08/29/2022 0931   CO2 27 08/29/2022 0931   GLUCOSE 123 (H) 08/29/2022 0931   BUN 7 08/29/2022 0931   CREATININE 0.70 08/29/2022 0931   CALCIUM 8.9 08/29/2022 0931   PROT 6.6 08/29/2022 0931   ALBUMIN 3.6 08/29/2022 0931   AST 16 08/29/2022 0931   ALT 8 08/29/2022 0931   ALKPHOS 102 08/29/2022 0931  BILITOT 0.4 08/29/2022 0931   GFRNONAA >60 08/29/2022 0931    Lab Results  Component Value Date   WBC 6.1 08/29/2022   NEUTROABS 3.2 08/29/2022   HGB 10.7 (L) 08/29/2022   HCT 32.7 (L) 08/29/2022   MCV 88.6 08/29/2022   PLT 252 08/29/2022   Addendum I have seen the patient, examined him. I agree with the assessment and and plan and have edited the notes.   Mr. Limpert is here to start for cycle FOLFIRI.  He is clinically stable, no new complaints.  Unfortunately we are not able to give him 5-FU pump over 46 hours due to the weekend schedule, he previously tolerated FLOT well, I will change today's 5-FU pump infusion time to 24 hours.  After lengthy discussion, he finally agreed to change future appointment to Thursday, so we can DC his pump on Saturday.  All questions were answered.  Malachy Mood MD 08/29/2022

## 2022-08-29 NOTE — Assessment & Plan Note (Addendum)
cT3N3Mx, with hypermetabolic left supraclavicular node, and indeterminate adrenal nodule, MMR normal ypT3ypN3a -diagnosed 10/18/21 by EGD for 6 month f/u of gastric ulcers, showed mucosal intramucosal adenocarcinoma. MMR normal. -baseline CEA 11/01/21 significantly elevated at 1,989.58. -EUS on 11/06/21 by Dr. Dulce Sellar, staged as T3 N3 (at least stage III) -staging CT CAP 11/08/21 showed: stable gastric antrum wall thickening; increased size of upper abdominal lymph nodes; stable left adrenal nodule, 1.9 cm. -PET scan showed FDG avid lymph nodes within the gastrohepatic ligament, portacaval region, and mesentery. Tracer avid left supraclavicular lymph node and indeterminate left adrenal gland nodule with mild FDG uptake. -he underwent [place] node biopsy on 1/17 which was negative for malignant cells, but no lymphoid tissue seen on biopsy  -repeated PET on 02/07/22 after 3 months neoadjuvant chemo showed resolved left supraclavicular lymph node, and significant improvement in regional lymph nodes and the primary tumor, stable and indeterminate left adrenal nodule.  -CT adrenal protocol showed the left adrenal nodule is likely benign adenoma, no biopsy is needed. -Completed neoadjvant chemo FLOT s/p 12 cycles 11/22/21 - 04/27/2022 -restaging PET on 05/16/22 showed significantly hypermetabolic activity at the primary gastric cancer in pylorus, and a small hypermetabolic mesenteric lymph node, no other evidence of metastasis. surgeon Dr. Sheliah Hatch aware of PET findings -S/p distal gastrectomy 05/20/22, path showed ypT3N3a with clear margins, 7/18 + LNs, and lymphovascular invasion identified. There was some treatment effect in a few lymph nodes nut not in the primary tumor -due to rising tumor marker, he underwent PET scan on July 07, 2022, which unfortunately showed hypermetabolic new adenopathy in right hilar and mesentery, diffuse liver metastasis, consistent with metastatic recurrence.  I personally reviewed the PET  scan images with patient -Unfortunately his disease is not curable at this stage. -I recommend second line chemotherapy with paclitaxel and ramucirumab --Chemotherapy consent: Side effects including but does not not limited to, fatigue, nausea, vomiting, diarrhea, hair loss, neuropathy, fluid retention, renal and kidney dysfunction, neutropenic fever, needed for blood transfusion, bleeding, were discussed with patient in great detail. He agrees to proceed. -The goal of chemotherapy is palliative, to prolong his life -I discussed his next January sequencing foundation one result, which showed EGFR amplification, no other targetable mutations.  His tumor was previously tested for HER2 which was negative (0-1+).  PD-L1 CPS score was 2%, no significant benefit of immunotherapy.  -He started treatment on 07/25/2022  Due to his pre-existing neuropathy, will give paclitaxel and ramucirumab every 2 weeks after today's treatment  08/15/2022 - he is scheduled for Cycle 3 today  -he is reporting a significant increase in neuropathy since his last visit. This is mostly effecting his hands. They feel swollen and they throb. Sometimes, the hands are hard to move. His feet hurt when he stands or walks for too long. He is currently taking gabapentin 100mg  up to four times daily. He states that neuropathy is worse In the mornings. He does use his hands a lot at work. Neuropathy is beginning to negatively impact his work.  -states that he does have decreased taste sensation. Makes him not feel like eating. Eats anyway because he know he has to. Drinking electrolyte drinks. States that he does get some nausea for which he takes prescribed medications. Tries to stay ahead of the nausea with medication. States that his bowels move "ok." Gets very mild constipation intermittently.  -denies fever, chills, or unusual night sweats. He reports some fatigue which is more severe the first few days after chemotherapy.  -he  continues to  work full time. 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  Labs reviewed  -CBC showing WBC 6.1; Hgb 10.7; Hct 32.7; Plt 252; Anc 3.2 -CMP - K 3.7; glucose 123; BUN 7; Creatinine 0.7; eGFR > 60; Ca 8.5; LFTs normal.   He states that he has new pain and stiffness in hips and upper thighs. This is intermittent. He takes tylenol 500 mg for this which is helpful sometimes. Has been afraid to take with gabapentin.  He denies chest pain, chest pressure, or shortness of breath. He denies headaches or visual disturbances. He denies abdominal pain, nausea, vomiting, or changes in bowel or bladder habits.   Proceed to treatment Cycle 1 day 1.  Advised him to try OTC tylenol arthritis strength which is 650 mg, up to three times daily if needed for pain. Advised it is OK to alternate with gabapentin use.  Labs/flush, follow up, and chemotherapy every 2 weeks.

## 2022-08-29 NOTE — Progress Notes (Signed)
Per Dr. Mosetta Putt she would like the patient's 5FU to run over 24 hours instead of 46 hours due to patient not being able to have pump dc'd on Sunday. Orders have been updated.  Demetrius Charity, PharmD

## 2022-08-30 ENCOUNTER — Inpatient Hospital Stay: Payer: BC Managed Care – PPO

## 2022-08-30 ENCOUNTER — Other Ambulatory Visit: Payer: Self-pay

## 2022-08-30 DIAGNOSIS — C163 Malignant neoplasm of pyloric antrum: Secondary | ICD-10-CM

## 2022-08-31 ENCOUNTER — Other Ambulatory Visit: Payer: Self-pay

## 2022-09-02 ENCOUNTER — Encounter: Payer: Self-pay | Admitting: Physician Assistant

## 2022-09-02 ENCOUNTER — Encounter: Payer: Self-pay | Admitting: Hematology

## 2022-09-04 ENCOUNTER — Ambulatory Visit: Payer: BC Managed Care – PPO

## 2022-09-04 ENCOUNTER — Ambulatory Visit: Payer: BC Managed Care – PPO | Admitting: Hematology

## 2022-09-04 ENCOUNTER — Other Ambulatory Visit: Payer: BC Managed Care – PPO

## 2022-09-05 ENCOUNTER — Ambulatory Visit: Payer: BC Managed Care – PPO | Admitting: Nurse Practitioner

## 2022-09-05 ENCOUNTER — Ambulatory Visit: Payer: BC Managed Care – PPO

## 2022-09-05 ENCOUNTER — Other Ambulatory Visit: Payer: BC Managed Care – PPO

## 2022-09-09 ENCOUNTER — Other Ambulatory Visit: Payer: Self-pay

## 2022-09-10 MED FILL — Dexamethasone Sodium Phosphate Inj 100 MG/10ML: INTRAMUSCULAR | Qty: 1 | Status: AC

## 2022-09-11 ENCOUNTER — Encounter: Payer: Self-pay | Admitting: Hematology

## 2022-09-11 ENCOUNTER — Inpatient Hospital Stay: Payer: BC Managed Care – PPO

## 2022-09-11 ENCOUNTER — Inpatient Hospital Stay: Payer: BC Managed Care – PPO | Attending: Physician Assistant | Admitting: Hematology

## 2022-09-11 VITALS — BP 136/82 | HR 63 | Temp 97.8°F | Resp 18 | Ht 65.0 in | Wt 171.5 lb

## 2022-09-11 DIAGNOSIS — Z9221 Personal history of antineoplastic chemotherapy: Secondary | ICD-10-CM | POA: Diagnosis not present

## 2022-09-11 DIAGNOSIS — D500 Iron deficiency anemia secondary to blood loss (chronic): Secondary | ICD-10-CM | POA: Insufficient documentation

## 2022-09-11 DIAGNOSIS — T451X5D Adverse effect of antineoplastic and immunosuppressive drugs, subsequent encounter: Secondary | ICD-10-CM | POA: Insufficient documentation

## 2022-09-11 DIAGNOSIS — G620 Drug-induced polyneuropathy: Secondary | ICD-10-CM | POA: Diagnosis not present

## 2022-09-11 DIAGNOSIS — R970 Elevated carcinoembryonic antigen [CEA]: Secondary | ICD-10-CM | POA: Insufficient documentation

## 2022-09-11 DIAGNOSIS — C163 Malignant neoplasm of pyloric antrum: Secondary | ICD-10-CM

## 2022-09-11 DIAGNOSIS — K5900 Constipation, unspecified: Secondary | ICD-10-CM | POA: Diagnosis not present

## 2022-09-11 DIAGNOSIS — C787 Secondary malignant neoplasm of liver and intrahepatic bile duct: Secondary | ICD-10-CM | POA: Insufficient documentation

## 2022-09-11 DIAGNOSIS — Z1379 Encounter for other screening for genetic and chromosomal anomalies: Secondary | ICD-10-CM

## 2022-09-11 DIAGNOSIS — T451X5A Adverse effect of antineoplastic and immunosuppressive drugs, initial encounter: Secondary | ICD-10-CM

## 2022-09-11 DIAGNOSIS — E278 Other specified disorders of adrenal gland: Secondary | ICD-10-CM | POA: Diagnosis not present

## 2022-09-11 DIAGNOSIS — Z5111 Encounter for antineoplastic chemotherapy: Secondary | ICD-10-CM | POA: Insufficient documentation

## 2022-09-11 DIAGNOSIS — E669 Obesity, unspecified: Secondary | ICD-10-CM | POA: Diagnosis not present

## 2022-09-11 DIAGNOSIS — R110 Nausea: Secondary | ICD-10-CM | POA: Diagnosis not present

## 2022-09-11 DIAGNOSIS — R5383 Other fatigue: Secondary | ICD-10-CM | POA: Insufficient documentation

## 2022-09-11 DIAGNOSIS — Z95828 Presence of other vascular implants and grafts: Secondary | ICD-10-CM

## 2022-09-11 LAB — CBC WITH DIFFERENTIAL (CANCER CENTER ONLY)
Abs Immature Granulocytes: 0.01 10*3/uL (ref 0.00–0.07)
Basophils Absolute: 0.1 10*3/uL (ref 0.0–0.1)
Basophils Relative: 1 %
Eosinophils Absolute: 0.1 10*3/uL (ref 0.0–0.5)
Eosinophils Relative: 1 %
HCT: 33.4 % — ABNORMAL LOW (ref 39.0–52.0)
Hemoglobin: 11.1 g/dL — ABNORMAL LOW (ref 13.0–17.0)
Immature Granulocytes: 0 %
Lymphocytes Relative: 31 %
Lymphs Abs: 1.7 10*3/uL (ref 0.7–4.0)
MCH: 28.8 pg (ref 26.0–34.0)
MCHC: 33.2 g/dL (ref 30.0–36.0)
MCV: 86.8 fL (ref 80.0–100.0)
Monocytes Absolute: 0.7 10*3/uL (ref 0.1–1.0)
Monocytes Relative: 13 %
Neutro Abs: 3 10*3/uL (ref 1.7–7.7)
Neutrophils Relative %: 54 %
Platelet Count: 244 10*3/uL (ref 150–400)
RBC: 3.85 MIL/uL — ABNORMAL LOW (ref 4.22–5.81)
RDW: 16.4 % — ABNORMAL HIGH (ref 11.5–15.5)
WBC Count: 5.5 10*3/uL (ref 4.0–10.5)
nRBC: 0 % (ref 0.0–0.2)

## 2022-09-11 LAB — CMP (CANCER CENTER ONLY)
ALT: 7 U/L (ref 0–44)
AST: 14 U/L — ABNORMAL LOW (ref 15–41)
Albumin: 3.8 g/dL (ref 3.5–5.0)
Alkaline Phosphatase: 92 U/L (ref 38–126)
Anion gap: 5 (ref 5–15)
BUN: 9 mg/dL (ref 6–20)
CO2: 28 mmol/L (ref 22–32)
Calcium: 9.2 mg/dL (ref 8.9–10.3)
Chloride: 108 mmol/L (ref 98–111)
Creatinine: 0.74 mg/dL (ref 0.61–1.24)
GFR, Estimated: >60 mL/min (ref >60)
Glucose, Bld: 83 mg/dL (ref 70–99)
Potassium: 4 mmol/L (ref 3.5–5.1)
Sodium: 141 mmol/L (ref 135–145)
Total Bilirubin: 0.4 mg/dL (ref 0.3–1.2)
Total Protein: 6.8 g/dL (ref 6.5–8.1)

## 2022-09-11 LAB — CEA (IN HOUSE-CHCC): CEA (CHCC-In House): 4672.32 ng/mL — ABNORMAL HIGH (ref 0.00–5.00)

## 2022-09-11 MED ORDER — SODIUM CHLORIDE 0.9 % IV SOLN
400.0000 mg/m2 | Freq: Once | INTRAVENOUS | Status: AC
  Administered 2022-09-11: 760 mg via INTRAVENOUS
  Filled 2022-09-11: qty 17.5

## 2022-09-11 MED ORDER — SODIUM CHLORIDE 0.9 % IV SOLN
180.0000 mg/m2 | Freq: Once | INTRAVENOUS | Status: AC
  Administered 2022-09-11: 340 mg via INTRAVENOUS
  Filled 2022-09-11: qty 2

## 2022-09-11 MED ORDER — SODIUM CHLORIDE 0.9 % IV SOLN
2400.0000 mg/m2 | INTRAVENOUS | Status: DC
  Administered 2022-09-11: 5000 mg via INTRAVENOUS
  Filled 2022-09-11: qty 100

## 2022-09-11 MED ORDER — PALONOSETRON HCL INJECTION 0.25 MG/5ML
0.2500 mg | Freq: Once | INTRAVENOUS | Status: AC
  Administered 2022-09-11: 0.25 mg via INTRAVENOUS
  Filled 2022-09-11: qty 5

## 2022-09-11 MED ORDER — SODIUM CHLORIDE 0.9 % IV SOLN: Freq: Once | INTRAVENOUS | Status: AC

## 2022-09-11 MED ORDER — SODIUM CHLORIDE 0.9% FLUSH
10.0000 mL | Freq: Once | INTRAVENOUS | Status: AC
  Administered 2022-09-11: 10 mL

## 2022-09-11 MED ORDER — SODIUM CHLORIDE 0.9 % IV SOLN
10.0000 mg | Freq: Once | INTRAVENOUS | Status: AC
  Administered 2022-09-11: 10 mg via INTRAVENOUS
  Filled 2022-09-11: qty 10

## 2022-09-11 NOTE — Assessment & Plan Note (Signed)
cT3N3Mx, with hypermetabolic left supraclavicular node, and indeterminate adrenal nodule, MMR normal ypT3ypN3a, liver and nodes recurrence in 07/2022 -diagnosed 10/18/21 by EGD for 6 month f/u of gastric ulcers, showed mucosal intramucosal adenocarcinoma. MMR normal. -baseline CEA 11/01/21 significantly elevated at 1,989.58. -EUS on 11/06/21 by Dr. Dulce Sellar, staged as T3 N3 (at least stage III) -staging CT CAP 11/08/21 showed: stable gastric antrum wall thickening; increased size of upper abdominal lymph nodes; stable left adrenal nodule, 1.9 cm. -PET scan showed FDG avid lymph nodes within the gastrohepatic ligament, portacaval region, and mesentery. Tracer avid left supraclavicular lymph node and indeterminate left adrenal gland nodule with mild FDG uptake. -he underwent [place] node biopsy on 1/17 which was negative for malignant cells, but no lymphoid tissue seen on biopsy  -repeated PET on 02/07/22 after 3 months neoadjuvant chemo showed resolved left supraclavicular lymph node, and significant improvement in regional lymph nodes and the primary tumor, stable and indeterminate left adrenal nodule.  -CT adrenal protocol showed the left adrenal nodule is likely benign adenoma, no biopsy is needed. -Completed neoadjvant chemo FLOT s/p 12 cycles 11/22/21 - 04/27/2022 -restaging PET on 05/16/22 showed significantly hypermetabolic activity at the primary gastric cancer in pylorus, and a small hypermetabolic mesenteric lymph node, no other evidence of metastasis. surgeon Dr. Sheliah Hatch aware of PET findings -S/p distal gastrectomy 05/20/22, path showed ypT3N3a with clear margins, 7/18 + LNs, and lymphovascular invasion identified. There was some treatment effect in a few lymph nodes nut not in the primary tumor -due to rising tumor marker, he underwent PET scan on July 07, 2022, which unfortunately showed hypermetabolic new adenopathy in right hilar and mesentery, diffuse liver metastasis, consistent with metastatic  recurrence.  I personally reviewed the PET scan images with patient -Unfortunately his disease is not curable at this stage. -I recommend second line chemotherapy with paclitaxel and ramucirumab --Chemotherapy consent: Side effects including but does not not limited to, fatigue, nausea, vomiting, diarrhea, hair loss, neuropathy, fluid retention, renal and kidney dysfunction, neutropenic fever, needed for blood transfusion, bleeding, were discussed with patient in great detail. He agrees to proceed. -The goal of chemotherapy is palliative, to prolong his life -I discussed his next January sequencing foundation one result, which showed EGFR amplification, no other targetable mutations.  His tumor was previously tested for HER2 which was negative (0-1+).  PD-L1 CPS score was 2%, no significant benefit of immunotherapy.  -He started treatment on 07/25/2022  Due to his pre-existing neuropathy, will give paclitaxel and ramucirumab every 2 weeks.  He developed neuropathy quickly, and I had to switch his treatment to FOLFIRI on 08/29/2022

## 2022-09-11 NOTE — Progress Notes (Signed)
[facility]   [phone] [phone]   Clinic Follow up Note   Patient Care Team: Pcp, No as PCP - General Malachy Mood, MD as Consulting Physician (Hematology and Oncology)  Date of Service:  09/11/2022  CHIEF COMPLAINT: f/u of Gastric Cancer   CURRENT THERAPY:    COLORECTAL FOLFIRI   ASSESSMENT:  Jonathan Maynard is a 52 y.o. male with   Gastric cancer (HCC) cT3N3Mx, with hypermetabolic left supraclavicular node, and indeterminate adrenal nodule, MMR normal ypT3ypN3a, liver and nodes recurrence in 07/2022 -diagnosed 10/18/21 by EGD for 6 month f/u of gastric ulcers, showed mucosal intramucosal adenocarcinoma. MMR normal. -baseline CEA 11/01/21 significantly elevated at 1,989.58. -EUS on 11/06/21 by Dr. Dulce Sellar, staged as T3 N3 (at least stage III) -staging CT CAP 11/08/21 showed: stable gastric antrum wall thickening; increased size of upper abdominal lymph nodes; stable left adrenal nodule, 1.9 cm. -PET scan showed FDG avid lymph nodes within the gastrohepatic ligament, portacaval region, and mesentery. Tracer avid left supraclavicular lymph node and indeterminate left adrenal gland nodule with mild FDG uptake. -he underwent [place] node biopsy on 1/17 which was negative for malignant cells, but no lymphoid tissue seen on biopsy  -repeated PET on 02/07/22 after 3 months neoadjuvant chemo showed resolved left supraclavicular lymph node, and significant improvement in regional lymph nodes and the primary tumor, stable and indeterminate left adrenal nodule.  -CT adrenal protocol showed the left adrenal nodule is likely benign adenoma, no biopsy is needed. -Completed neoadjvant chemo FLOT s/p 12 cycles 11/22/21 - 04/27/2022 -restaging PET on 05/16/22 showed significantly hypermetabolic activity at the primary gastric cancer in pylorus, and a small hypermetabolic mesenteric lymph node, no other evidence of metastasis. surgeon Dr. Sheliah Hatch aware of PET  findings -S/p distal gastrectomy 05/20/22, path showed ypT3N3a with clear margins, 7/18 + LNs, and lymphovascular invasion identified. There was some treatment effect in a few lymph nodes nut not in the primary tumor -due to rising tumor marker, he underwent PET scan on July 07, 2022, which unfortunately showed hypermetabolic new adenopathy in right hilar and mesentery, diffuse liver metastasis, consistent with metastatic recurrence.  I personally reviewed the PET scan images with patient -Unfortunately his disease is not curable at this stage. -I recommend second line chemotherapy with paclitaxel and ramucirumab --Chemotherapy consent: Side effects including but does not not limited to, fatigue, nausea, vomiting, diarrhea, hair loss, neuropathy, fluid retention, renal and kidney dysfunction, neutropenic fever, needed for blood transfusion, bleeding, were discussed with patient in great detail. He agrees to proceed. -The goal of chemotherapy is palliative, to prolong his life -I discussed his next January sequencing foundation one result, which showed EGFR amplification, no other targetable mutations.  His tumor was previously tested for HER2 which was negative (0-1+).  PD-L1 CPS score was 2%, no significant benefit of immunotherapy.  -He started treatment on 07/25/2022  Due to his pre-existing neuropathy, will give paclitaxel and ramucirumab every 2 weeks.  He developed neuropathy quickly, and I had to switch his treatment to FOLFIRI on 08/29/2022    Iron deficiency anemia due to chronic blood loss -received iv Venofer in 11/2021 -on oral iron   Genetic testing [employer] Custom Panel+RNA was Negative.  Peripheral neuropathy due to chemotherapy (HCC) -From previous hemotherapy especially oxaliplatin -Overall mild to moderate, he is able to function well without significant impact -Not on any medication for neuropathy      PLAN: -lab review -CMP-pending -recommend pt to eat high calorie food  to maintain  weight -proceed with C2 Folfiri today same dose. He is tolerating well overall  -monitor CEA q4 weeks Lab/flush and treatment 9/19  SUMMARY OF ONCOLOGIC HISTORY: Oncology History Overview Note   Cancer Staging  Gastric cancer [facility]) Staging form: Stomach, AJCC 8th Edition - Clinical stage from 10/18/2021: Stage III (cT3, cN3a, cM0) - Signed by Malachy Mood, MD on 11/10/2021     Gastric cancer (HCC)  10/18/2021 Procedure   EGD performed under the care of Dr. Bosie Clos  Findings:  -A large, ulcerated, partially circumferential (involving two thirds of the lumen circumference) mass with no bleeding and stigmata of recent bleeding was found in the gastric antrum, at the pylorus and in the prepyloric region of the stomach. -Segmental severe inflammation characterized by congestion (edema) and erythema was found in the gastric antrum.   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 Negative for dysplasia.  Negative for helicobacter pylori  Mismatch Repair (MMR) Protein Imunohistochemistry (IHC): IHC Expression Result: MLH1: Preserved nuclear expression. MSH2: Preserved nuclear expression. MSH6: Preserved nuclear expression. PMS2: Preserved nuclear expression. Interpretation: NORMAL   10/31/2021 Initial Diagnosis   Gastric cancer (HCC)   11/01/2021 Tumor Marker   CEA = 8,119.14 (^)   11/06/2021 Procedure   Upper EUS, Dr. Dulce Sellar  Impression: - Normal esophagus. - A small amount of food (residue) in the stomach. - Congested, friable (with contact  bleeding) and ulcerated mucosa in the prepyloric region of the stomach. - Normal examined duodenum. - There was no evidence of significant pathology in the left lobe of the liver. - Many abnormal lymph nodes (over 10) were visualized in the gastrohepatic ligament (level 18), celiac region (level 20), perigastric region, peripancreatic region and aortocaval region. - Wall thickening was seen in the prepyloric region of the stomach. The thickening appeared to be primarily within the deep mucosa (Layer 2) but extended through the muscularis propria. - Overall constellation of findings consistent with at least T3 N3 Mx (at least stage III) gastric adenocarcinoma.   11/08/2021 Imaging   EXAM: CT CHEST, ABDOMEN, AND PELVIS WITH CONTRAST  IMPRESSION: 1. Similar irregular wall thickening of the gastric antrum, consistent with patient's known primary gastric neoplasm. 2. Increased size of the upper abdominal lymph nodes, concerning for worsening nodal disease involvement. 3. Stable left adrenal nodule common nonspecific possibly a metastatic lesion or adenoma. 4. No evidence of metastatic disease in the chest. 5. Left-sided colonic diverticulosis without findings of acute diverticulitis. 6. Enlarged prostate gland. 7.  Aortic Atherosclerosis (ICD10-I70.0).   11/22/2021 - 04/26/2022 Chemotherapy   Patient is on Treatment Plan : GASTROESOPHAGEAL FLOT q14d X 4 cycles     11/27/2021 Imaging    IMPRESSION: 1. The mass within the gastric antrum is mildly decreased in size when compared with 11/08/2021. There is persistent FDG uptake associated with this mass compatible with residual tumor. 2. Persistent FDG avid lymph nodes within the gastrohepatic ligament, portacaval region, and mesentery. These are mildly decreased in size when compared with 11/08/2021. 3. Tracer avid left supraclavicular lymph node is also mildly decreased in size when compared with 11/08/2021. 4. Indeterminate left adrenal  gland nodule with mild FDG uptake.  This may represent a lipid poor adenoma. Metastatic disease not exclude. 5. Diffuse increased uptake throughout the bone marrow is favored to represent treatment related change. 6.  Aortic Atherosclerosis (ICD10-I70.0).     02/07/2022 Imaging    IMPRESSION: 1. No substantial change in hypermetabolism associated with the distal gastric lesion. Although difficult to discern on noncontrast CT imaging, the soft tissue component appears to be decreased since the prior PET-CT. 2. Interval resolution of the hypermetabolic left supraclavicular lymph node. 3. Interval marked decrease of the hypermetabolic gastrohepatic ligament lymphadenopathy. Similar decrease in size and hypermetabolism associated with index nodes identified previously in the 4. porta hepatis and hypogastric region. 5. Stable left adrenal nodule with slight hypermetabolism. Nonspecific finding could represent lipid poor adenoma or metastatic deposit. 6. No new suspicious hypermetabolic disease on today's study. 7.  Aortic Atherosclerosis (ICD10-I70.0).   07/25/2022 - 08/15/2022 Chemotherapy   Patient is on Treatment Plan : GASTROESOPHAGEAL Ramucirumab D1, 15 + Paclitaxel D1,8,15 q28d     08/29/2022 -  Chemotherapy   Patient is on Treatment Plan : COLORECTAL FOLFIRI q14d        INTERVAL HISTORY:  Che Cisse is here for a follow up of Gastric Cancer. He was last seen by  NP Heather on 08/29/2022. He presents to the clinic accompanied by wife. Pt state that first treatment went well. He reports of having diarrhea just one time. He denies having fatigue. He still working full time.Pt report that his neuropathy is tolerable with the medication he is taking for it.     All other systems were reviewed with the patient and are negative.  MEDICAL HISTORY:  Past Medical History:  Diagnosis Date   Acute upper GI bleed 04/12/2021   Anemia    Cancer (HCC)    stomach   Class 1  obesity 04/12/2021   Diverticulosis 04/12/2021   GERD (gastroesophageal reflux disease)    Hepatic steatosis 04/12/2021   Neuromuscular disorder (HCC)    neuropathy from chemo    SURGICAL HISTORY: Past Surgical History:  Procedure Laterality Date   BIOPSY  04/12/2021   Procedure: BIOPSY;  Surgeon: Charlott Rakes, MD;  Location: [facility];  Service: Gastroenterology;;   ESOPHAGOGASTRODUODENOSCOPY N/A 04/12/2021   Procedure: ESOPHAGOGASTRODUODENOSCOPY (EGD);  Surgeon: Charlott Rakes, MD;  Location: [facility];  Service: Gastroenterology;  Laterality: N/A;   ESOPHAGOGASTRODUODENOSCOPY N/A 11/06/2021   Procedure: ESOPHAGOGASTRODUODENOSCOPY (EGD);  Surgeon: Willis Modena, MD;  Location: [facility];  Service: Gastroenterology;  Laterality: N/A;   GASTRECTOMY N/A 05/20/2022   Procedure: OPEN PARTIAL GASTRECTOMY WITH GASTROJEJUNAL ANASTOMOSIS;  Surgeon: Sheliah Hatch, De Blanch, MD;  Location: [facility];  Service: General;  Laterality: N/A;   HERNIA REPAIR Left    inguinal   IR IMAGING GUIDED PORT INSERTION  11/15/2021   LAPAROSCOPY N/A 05/20/2022   Procedure: LAPAROSCOPY DIAGNOSTIC;  Surgeon: Rodman Pickle, MD;  Location: [facility];  Service: General;  Laterality: N/A;   UPPER ESOPHAGEAL ENDOSCOPIC ULTRASOUND (EUS) Bilateral 11/06/2021   Procedure: UPPER ESOPHAGEAL ENDOSCOPIC ULTRASOUND (EUS);  Surgeon: Willis Modena, MD;  Location: [facility];  Service: Gastroenterology;  Laterality: Bilateral;    I have reviewed the social history and family history with the patient and they are unchanged from previous note.  ALLERGIES:  has No Known Allergies.  MEDICATIONS:  Current Outpatient Medications  Medication Sig Dispense Refill   acetaminophen (TYLENOL) 500 MG tablet Take 500 mg by mouth 2 (two) times daily.     dexamethasone (DECADRON) 4 MG tablet Take 2 tablets (8  mg total) by mouth daily. Start the day after chemotherapy for 2 days.  Take with food. 8 tablet 1   gabapentin  (NEURONTIN) 100 MG capsule Take 1-2 capsules (100-200 mg total) by mouth 4 (four) times daily as needed. OK to increase to 2 or 3 capsules in a few weeks if tolerates well 240 capsule 0   magic mouthwash (lidocaine, diphenhydrAMINE, alum & mag hydroxide) suspension Swish and spit 15 mLs 3 (three) times daily. (Patient taking differently: Swish and spit 15 mLs 3 (three) times daily as needed.) 100 mL 0   ondansetron (ZOFRAN-ODT) 4 MG disintegrating tablet Dissolve 1 tablet (4 mg total) by mouth every 6 (six) hours as needed for nausea or vomiting. 20 tablet 0   oxyCODONE (OXY IR/ROXICODONE) 5 MG immediate release tablet Take 1 tablet (5 mg total) by mouth every 6 (six) hours as needed for severe pain. 15 tablet 0   pantoprazole (PROTONIX) 40 MG tablet Take 1 tablet (40 mg) by mouth 2 times daily. 60 tablet 6   No current facility-administered medications for this visit.   Facility-Administered Medications Ordered in Other Visits  Medication Dose Route Frequency Provider Last Rate Last Admin   fluorouracil (ADRUCIL) 5,000 mg in sodium chloride 0.9 % 150 mL chemo infusion  2,400 mg/m2 (Treatment Plan Recorded) Intravenous 1 day or 1 dose Malachy Mood, MD       irinotecan (CAMPTOSAR) 340 mg in sodium chloride 0.9 % 500 mL chemo infusion  180 mg/m2 (Treatment Plan Recorded) Intravenous Once Malachy Mood, MD       leucovorin 760 mg in sodium chloride 0.9 % 250 mL infusion  400 mg/m2 (Treatment Plan Recorded) Intravenous Once Malachy Mood, MD        PHYSICAL EXAMINATION: ECOG PERFORMANCE STATUS: 1 - Symptomatic but completely ambulatory  Vitals:   09/11/22 0838  BP: 136/82  Pulse: 63  Resp: 18  Temp: 97.8 F (36.6 C)  SpO2: 100%   Wt Readings from Last 3 Encounters:  09/11/22 171 lb 8 oz (77.8 kg)  08/29/22 175 lb 1.6 oz (79.4 kg)  08/15/22 174 lb (78.9 kg)    GENERAL:alert, no distress and comfortable SKIN: skin color normal, no rashes or significant lesions EYES: normal, Conjunctiva are pink and  non-injected, sclera clear  NEURO: alert & oriented x 3 with fluent speech LABORATORY DATA:  I have reviewed the data as listed    Latest Ref Rng & Units 09/11/2022    8:13 AM 08/29/2022    9:31 AM 08/15/2022    9:47 AM  CBC  WBC 4.0 - 10.5 K/uL 5.5  6.1  10.1   Hemoglobin 13.0 - 17.0 g/dL 62.9  52.8  9.9   Hematocrit 39.0 - 52.0 % 33.4  32.7  29.7   Platelets 150 - 400 K/uL 244  252  239         Latest Ref Rng & Units 09/11/2022    8:13 AM 08/29/2022    9:31 AM 08/15/2022    9:47 AM  CMP  Glucose 70 - 99 mg/dL 83  413  96   BUN 6 - 20 mg/dL 9  7  11    Creatinine 0.61 - 1.24 mg/dL 2.44  0.10  2.72   Sodium 135 - 145 mmol/L 141  143  140   Potassium 3.5 - 5.1 mmol/L 4.0  3.7  3.2   Chloride 98 - 111 mmol/L 108  110  108   CO2 22 - 32 mmol/L 28  27  26   Calcium 8.9 - 10.3 mg/dL 9.2  8.9  8.5   Total Protein 6.5 - 8.1 g/dL 6.8  6.6  6.1   Total Bilirubin 0.3 - 1.2 mg/dL 0.4  0.4  0.3   Alkaline Phos 38 - 126 U/L 92  102  89   AST 15 - 41 U/L 14  16  15    ALT 0 - 44 U/L 7  8  15        RADIOGRAPHIC STUDIES: I have personally reviewed the radiological images as listed and agreed with the findings in the report. No results found.    Orders Placed This Encounter  Procedures   CBC with Differential (Cancer Center Only)    Standing Status:   Future    Standing Expiration Date:   10/23/2023   CMP (Cancer Center only)    Standing Status:   Future    Standing Expiration Date:   10/23/2023   CBC with Differential (Cancer Center Only)    Standing Status:   Future    Standing Expiration Date:   11/06/2023   CMP (Cancer Center only)    Standing Status:   Future    Standing Expiration Date:   11/06/2023   All questions were answered. The patient knows to call the clinic with any problems, questions or concerns. No barriers to learning was detected. The total time spent in the appointment was 30 minutes.     Malachy Mood, MD 09/11/2022   Carolin Coy, CMA, am acting as scribe  for Malachy Mood, MD.   I have reviewed the above documentation for accuracy and completeness, and I agree with the above.

## 2022-09-11 NOTE — Assessment & Plan Note (Signed)
 [facility] Custom Panel+RNA was Negative.

## 2022-09-11 NOTE — Assessment & Plan Note (Signed)
-  From previous hemotherapy especially oxaliplatin -Overall mild to moderate, he is able to function well without significant impact -Not on any medication for neuropathy

## 2022-09-11 NOTE — Patient Instructions (Signed)
[facility]  Discharge Instructions: Thank you for choosing [facility] to provide your oncology and hematology care.   If you have a lab appointment with the [facility], please go directly to the [facility] and check in at the registration area.   Wear comfortable clothing and clothing appropriate for easy access to any Portacath or PICC line.   We strive to give you quality time with your provider. You may need to reschedule your appointment if you arrive late (15 or more minutes).  Arriving late affects you and other patients whose appointments are after yours.  Also, if you miss three or more appointments without notifying the office, you may be dismissed from the clinic at the provider's discretion.      For prescription refill requests, have your pharmacy contact our office and allow 72 hours for refills to be completed.    Today you received the following chemotherapy and/or immunotherapy agents: Irinotecan, Leucovorin, Fluorouracil.       To help prevent nausea and vomiting after your treatment, we encourage you to take your nausea medication as directed.  BELOW ARE SYMPTOMS THAT SHOULD BE REPORTED IMMEDIATELY: *FEVER GREATER THAN 100.4 F (38 C) OR HIGHER *CHILLS OR SWEATING *NAUSEA AND VOMITING THAT IS NOT CONTROLLED WITH YOUR NAUSEA MEDICATION *UNUSUAL SHORTNESS OF BREATH *UNUSUAL BRUISING OR BLEEDING *URINARY PROBLEMS (pain or burning when urinating, or frequent urination) *BOWEL PROBLEMS (unusual diarrhea, constipation, pain near the anus) TENDERNESS IN MOUTH AND THROAT WITH OR WITHOUT PRESENCE OF ULCERS (sore throat, sores in mouth, or a toothache) UNUSUAL RASH, SWELLING OR PAIN  UNUSUAL VAGINAL DISCHARGE OR ITCHING   Items with * indicate a potential emergency and should be followed up as soon as possible or go to the Emergency Department if any problems should occur.  Please show the CHEMOTHERAPY ALERT CARD or  IMMUNOTHERAPY ALERT CARD at check-in to the Emergency Department and triage nurse.  Should you have questions after your visit or need to cancel or reschedule your appointment, please contact [facility]  Dept: [phone]  and follow the prompts.  Office hours are 8:00 a.m. to 4:30 p.m. Monday - Friday. Please note that voicemails left after 4:00 p.m. may not be returned until the following business day.  We are closed weekends and major holidays. You have access to a nurse at all times for urgent questions. Please call the main number to the clinic Dept: [phone] and follow the prompts.   For any non-urgent questions, you may also contact your provider using MyChart. We now offer e-Visits for anyone 25 and older to request care online for non-urgent symptoms. For details visit [email or web address].   Also download the MyChart app! Go to the app store, search "MyChart", open the app, select [name], and log in with your MyChart username and password.

## 2022-09-11 NOTE — Assessment & Plan Note (Signed)
-  received iv Venofer in 11/2021 -on oral iron

## 2022-09-12 ENCOUNTER — Other Ambulatory Visit: Payer: BC Managed Care – PPO

## 2022-09-12 ENCOUNTER — Ambulatory Visit: Payer: BC Managed Care – PPO

## 2022-09-12 ENCOUNTER — Ambulatory Visit: Payer: BC Managed Care – PPO | Admitting: Nurse Practitioner

## 2022-09-13 ENCOUNTER — Inpatient Hospital Stay: Payer: BC Managed Care – PPO

## 2022-09-13 VITALS — BP 130/82 | HR 72 | Temp 97.7°F | Resp 14

## 2022-09-13 DIAGNOSIS — Z5111 Encounter for antineoplastic chemotherapy: Secondary | ICD-10-CM | POA: Diagnosis not present

## 2022-09-13 DIAGNOSIS — C163 Malignant neoplasm of pyloric antrum: Secondary | ICD-10-CM

## 2022-09-13 MED ORDER — HEPARIN SOD (PORK) LOCK FLUSH 100 UNIT/ML IV SOLN
500.0000 [IU] | Freq: Once | INTRAVENOUS | Status: AC | PRN
  Administered 2022-09-13: 500 [IU]

## 2022-09-13 MED ORDER — SODIUM CHLORIDE 0.9% FLUSH
10.0000 mL | INTRAVENOUS | Status: DC | PRN
  Administered 2022-09-13: 10 mL

## 2022-09-24 MED FILL — Dexamethasone Sodium Phosphate Inj 100 MG/10ML: INTRAMUSCULAR | Qty: 1 | Status: AC

## 2022-09-24 NOTE — Progress Notes (Unsigned)
Patient Care Team: Pcp, No as PCP - General Malachy Mood, MD as Consulting Physician (Hematology and Oncology)   CHIEF COMPLAINT: Follow up gastric cancer   Oncology History Overview Note   Cancer Staging  Gastric cancer [facility]) Staging form: Stomach, AJCC 8th Edition - Clinical stage from 10/18/2021: Stage III (cT3, cN3a, cM0) - Signed by Malachy Mood, MD on 11/10/2021     Gastric cancer (HCC)  10/18/2021 Procedure   EGD performed under the care of Dr. Bosie Clos  Findings:  -A large, ulcerated, partially circumferential (involving two thirds of the lumen circumference) mass with no bleeding and stigmata of recent bleeding was found in the gastric antrum, at the pylorus and in the prepyloric region of the stomach. -Segmental severe inflammation characterized by congestion (edema) and erythema was found in the gastric antrum.   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 Negative for dysplasia.  Negative for helicobacter pylori  Mismatch Repair (MMR) Protein Imunohistochemistry (IHC): IHC Expression Result: MLH1: Preserved nuclear expression. MSH2: Preserved nuclear expression. MSH6: Preserved nuclear expression. PMS2: Preserved nuclear expression. Interpretation: NORMAL   10/31/2021 Initial Diagnosis   Gastric cancer (HCC)   11/01/2021 Tumor Marker   CEA = 8,469.62 (^)   11/06/2021 Procedure   Upper EUS, Dr. Dulce Sellar  Impression: - Normal esophagus. - A small amount of food (residue) in the stomach. - Congested, friable (with contact bleeding) and  ulcerated mucosa in the prepyloric region of the stomach. - Normal examined duodenum. - There was no evidence of significant pathology in the left lobe of the liver. - Many abnormal lymph nodes (over 10) were visualized in the gastrohepatic ligament (level 18), celiac region (level 20), perigastric region, peripancreatic region and aortocaval region. - Wall thickening was seen in the prepyloric region of the stomach. The thickening appeared to be primarily within the deep mucosa (Layer 2) but extended through the muscularis propria. - Overall constellation of findings consistent with at least T3 N3 Mx (at least stage III) gastric adenocarcinoma.   11/08/2021 Imaging   EXAM: CT CHEST, ABDOMEN, AND PELVIS WITH CONTRAST  IMPRESSION: 1. Similar irregular wall thickening of the gastric antrum, consistent with patient's known primary gastric neoplasm. 2. Increased size of the upper abdominal lymph nodes, concerning for worsening nodal disease involvement. 3. Stable left adrenal nodule common nonspecific possibly a metastatic lesion or adenoma. 4. No evidence of metastatic disease in the chest. 5. Left-sided colonic diverticulosis without findings of acute diverticulitis. 6. Enlarged prostate gland. 7.  Aortic Atherosclerosis (ICD10-I70.0).   11/22/2021 - 04/26/2022 Chemotherapy   Patient is on Treatment Plan : GASTROESOPHAGEAL FLOT q14d X 4 cycles     11/27/2021 Imaging    IMPRESSION: 1. The mass within the gastric antrum is mildly decreased in size when compared with 11/08/2021. There is persistent FDG uptake associated with this mass compatible with residual tumor. 2. Persistent FDG avid lymph nodes within the gastrohepatic ligament, portacaval region, and mesentery. These are mildly decreased in size when compared with 11/08/2021. 3. Tracer avid left supraclavicular lymph node is also mildly decreased in size when compared with 11/08/2021. 4. Indeterminate left adrenal gland nodule  with  mild FDG uptake. This may represent a lipid poor adenoma. Metastatic disease not exclude. 5. Diffuse increased uptake throughout the bone marrow is favored to represent treatment related change. 6.  Aortic Atherosclerosis (ICD10-I70.0).     02/07/2022 Imaging    IMPRESSION: 1. No substantial change in hypermetabolism associated with the distal gastric lesion. Although difficult to discern on noncontrast CT imaging, the soft tissue component appears to be decreased since the prior PET-CT. 2. Interval resolution of the hypermetabolic left supraclavicular lymph node. 3. Interval marked decrease of the hypermetabolic gastrohepatic ligament lymphadenopathy. Similar decrease in size and hypermetabolism associated with index nodes identified previously in the 4. porta hepatis and hypogastric region. 5. Stable left adrenal nodule with slight hypermetabolism. Nonspecific finding could represent lipid poor adenoma or metastatic deposit. 6. No new suspicious hypermetabolic disease on today's study. 7.  Aortic Atherosclerosis (ICD10-I70.0).   07/25/2022 - 08/15/2022 Chemotherapy   Patient is on Treatment Plan : GASTROESOPHAGEAL Ramucirumab D1, 15 + Paclitaxel D1,8,15 q28d     08/29/2022 -  Chemotherapy   Patient is on Treatment Plan : COLORECTAL FOLFIRI q14d        CURRENT THERAPY: FOLFIRI q2 weeks, starting 08/29/22  INTERVAL HISTORY Jonathan Maynard returns for follow up and treatment as scheduled. Last seen by Dr. Mosetta Putt 09/11/22 with C2 FOLFIRI.  He tolerates treatment well.  He has mild fatigue with 5-FU pump, drinking water and snacks help keep his energy up.  He remains able to work.  He has mild constipation, and nausea improved with Decadron.  He has not taken MiraLAX yet.  Last BM yesterday.  Neuropathy has eased up some.  He thinks his temperature was high from drinking hot coffee, does not feel bad.  Denies fever, chills, cough, chest pain, dyspnea, dysuria, pain, or any other new or  specific complaints.  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     Past Surgical History:  Procedure Laterality Date   BIOPSY  04/12/2021   Procedure: BIOPSY;  Surgeon: Charlott Rakes, MD;  Location: [facility];  Service: Gastroenterology;;   ESOPHAGOGASTRODUODENOSCOPY N/A 04/12/2021   Procedure: ESOPHAGOGASTRODUODENOSCOPY (EGD);  Surgeon: Charlott Rakes, MD;  Location: [facility];  Service: Gastroenterology;  Laterality: N/A;   ESOPHAGOGASTRODUODENOSCOPY N/A 11/06/2021   Procedure: ESOPHAGOGASTRODUODENOSCOPY (EGD);  Surgeon: Willis Modena, MD;  Location: [facility];  Service: Gastroenterology;  Laterality: N/A;   GASTRECTOMY N/A 05/20/2022   Procedure: OPEN PARTIAL GASTRECTOMY WITH GASTROJEJUNAL ANASTOMOSIS;  Surgeon: Sheliah Hatch, De Blanch, MD;  Location: [facility];  Service: General;  Laterality: N/A;   HERNIA REPAIR Left    inguinal   IR IMAGING GUIDED PORT INSERTION  11/15/2021   LAPAROSCOPY N/A 05/20/2022   Procedure: LAPAROSCOPY DIAGNOSTIC;  Surgeon: Rodman Pickle, MD;  Location: [facility];  Service: General;  Laterality: N/A;   UPPER ESOPHAGEAL ENDOSCOPIC ULTRASOUND (EUS) Bilateral 11/06/2021   Procedure: UPPER ESOPHAGEAL ENDOSCOPIC ULTRASOUND (EUS);  Surgeon: Willis Modena, MD;  Location: [facility];  Service: Gastroenterology;  Laterality: Bilateral;     Outpatient Encounter Medications as of 09/25/2022  Medication Sig   acetaminophen (TYLENOL) 500 MG tablet Take 500 mg by mouth 2 (two) times daily.  dexamethasone (DECADRON) 4 MG tablet Take 2 tablets (8 mg total) by mouth daily. Start the day after chemotherapy for 2 days.  Take with food.   gabapentin  (NEURONTIN) 100 MG capsule Take 1-2 capsules (100-200 mg total) by mouth 4 (four) times daily as needed. OK to increase to 2 or 3 capsules in a few weeks if tolerates well   magic mouthwash (lidocaine, diphenhydrAMINE, alum & mag hydroxide) suspension Swish and spit 15 mLs 3 (three) times daily. (Patient taking differently: Swish and spit 15 mLs 3 (three) times daily as needed.)   ondansetron (ZOFRAN-ODT) 4 MG disintegrating tablet Dissolve 1 tablet (4 mg total) by mouth every 6 (six) hours as needed for nausea or vomiting.   oxyCODONE (OXY IR/ROXICODONE) 5 MG immediate release tablet Take 1 tablet (5 mg total) by mouth every 6 (six) hours as needed for severe pain.   pantoprazole (PROTONIX) 40 MG tablet Take 1 tablet (40 mg) by mouth 2 times daily.   No facility-administered encounter medications on file as of 09/25/2022.     Today's Vitals   09/25/22 0828 09/25/22 0854  BP: 122/78   Pulse: 72   Resp: 17   Temp: 100.3 F (37.9 C) 98.4 F (36.9 C)  TempSrc: Oral Oral  SpO2: 100%   Weight: 171 lb 6.4 oz (77.7 kg)   PainSc: 0-No pain    Body mass index is 28.52 kg/m.   PHYSICAL EXAM GENERAL:alert, no distress and comfortable SKIN: no rash  HEENT: sclera clear.  No thrush or ulcers.  Neck without mass LYMPH:  no palpable cervical or supraclavicular lymphadenopathy  LUNGS: clear with normal breathing effort HEART: regular rate & rhythm, no lower extremity edema ABDOMEN: abdomen soft, non-tender and normal bowel sounds NEURO: alert & oriented x 3 with fluent speech, no focal motor deficits PAC without erythema    CBC    Component Value Date/Time   WBC 5.8 09/25/2022 0809   WBC 11.0 (H) 05/24/2022 0504   RBC 3.86 (L) 09/25/2022 0809   HGB 11.0 (L) 09/25/2022 0809   HCT 33.3 (L) 09/25/2022 0809   PLT 212 09/25/2022 0809   MCV 86.3 09/25/2022 0809   MCH 28.5 09/25/2022 0809   MCHC 33.0 09/25/2022 0809   RDW 16.9 (H) 09/25/2022 0809   LYMPHSABS 2.0 09/25/2022 0809   MONOABS  0.5 09/25/2022 0809   EOSABS 0.1 09/25/2022 0809   BASOSABS 0.1 09/25/2022 0809     CMP     Component Value Date/Time   NA 140 09/25/2022 0809   K 3.7 09/25/2022 0809   CL 106 09/25/2022 0809   CO2 28 09/25/2022 0809   GLUCOSE 118 (H) 09/25/2022 0809   BUN 12 09/25/2022 0809   CREATININE 0.88 09/25/2022 0809   CALCIUM 9.6 09/25/2022 0809   PROT 7.1 09/25/2022 0809   ALBUMIN 4.1 09/25/2022 0809   AST 13 (L) 09/25/2022 0809   ALT 7 09/25/2022 0809   ALKPHOS 78 09/25/2022 0809   BILITOT 0.4 09/25/2022 0809   GFRNONAA >60 09/25/2022 0809     ASSESSMENT & PLAN:Jonathan Maynard is a 52 y.o. male with    Gastric cancer, stage III cT3N3Mx, with hypermetabolic left supraclavicular node, and indeterminate adrenal nodule, MMR normal ypT3ypN3a -diagnosed 10/18/21 by EGD for 6 month f/u of gastric ulcers, showed mucosal intramucosal adenocarcinoma. MMR normal. -baseline CEA 11/01/21 significantly elevated at 1,989.58. -EUS on 11/06/21 by Dr. Dulce Sellar, staged as T3 N3 (at least stage III) -staging CT CAP 11/08/21 showed: stable gastric antrum  wall thickening; increased size of upper abdominal lymph nodes; stable left adrenal nodule, 1.9 cm. -PET scan showed FDG avid lymph nodes within the gastrohepatic ligament, portacaval region, and mesentery. Tracer avid left supraclavicular lymph node and indeterminate left adrenal gland nodule with mild FDG uptake. -he underwent [place] node biopsy on 1/17 which was negative for malignant cells, but no lymphoid tissue seen on biopsy  -repeated PET on 02/07/22 after 3 months neoadjuvant chemo showed resolved left supraclavicular lymph node, and significant improvement in regional lymph nodes and the primary tumor, stable and indeterminate left adrenal nodule.  -CT adrenal protocol showed the left adrenal nodule is likely benign adenoma, no biopsy is needed. -Completed neoadjvant chemo FLOT s/p 12 cycles 11/22/21 - 04/27/2022 -restaging PET on 05/16/22 showed  significantly hypermetabolic activity at the primary gastric cancer in pylorus, and a small hypermetabolic mesenteric lymph node, no other evidence of metastasis. surgeon Dr. Sheliah Hatch aware of PET findings -S/p distal gastrectomy 05/20/22, path showed ypT3N3a with clear margins, 7/18 + LNs, and lymphovascular invasion identified. There was some treatment effect in a few lymph nodes nut not in the primary tumor -Due to rising CEA he underwent PET scan 07/07/22 which showed hypermetabolic new adenopathy in right hilum and mesentery, diffuse liver metastasis, consistent with metastatic recurrence. He understands cancer is not curable at this stage, unfortunately  -He began palliative treatment with taxol/Ramucirumab (due to baseline neuropathy) but worsened quickly, stopped after 3 doses and he changed to FOLFIRI 08/29/22 -Jonathan Maynard appears stable.  S/p cycle 2 FOLFIRI, tolerating well with mild fatigue, constipation, and nausea for a few days.  Side effects are adequately managed with supportive care at home.  - He is able to recover and function well, work, and maintain good performance status.  -Labs reviewed, adequate for treatment.  The CEA is rising, which is concerning but clinically he is doing well.  Will repeat a level today to plan next CT, either in 2 or 4 weeks     PLAN: -Labs reviewed -CT pending CEA level - if further increased, proceed in 2 weeks before next cycle; if stable/decreased, scan after next C4 -Proceed with C3 FOLFIRI today as scheduled -F/up in 2 weeks with next cycle    Orders Placed This Encounter  Procedures   CT CHEST ABDOMEN PELVIS W CONTRAST    Standing Status:   Future    Standing Expiration Date:   09/25/2023    Order Specific Question:   If indicated for the ordered procedure, I authorize the administration of contrast media per Radiology protocol    Answer:   Yes    Order Specific Question:   Does the patient have a contrast media/X-ray dye allergy?    Answer:    No    Order Specific Question:   Preferred imaging location?    Answer:   [facility]    Order Specific Question:   If indicated for the ordered procedure, I authorize the administration of oral contrast media per Radiology protocol    Answer:   Yes      All questions were answered. The patient knows to call the clinic with any problems, questions or concerns. No barriers to learning were detected. I spent 20 minutes counseling the patient face to face. The total time spent in the appointment was 30 minutes and more than 50% was on counseling, review of test results, and coordination of care.   Santiago Glad, NP-C 09/25/2022

## 2022-09-25 ENCOUNTER — Encounter: Payer: Self-pay | Admitting: Hematology

## 2022-09-25 ENCOUNTER — Inpatient Hospital Stay (HOSPITAL_BASED_OUTPATIENT_CLINIC_OR_DEPARTMENT_OTHER): Payer: BC Managed Care – PPO | Admitting: Nurse Practitioner

## 2022-09-25 ENCOUNTER — Inpatient Hospital Stay: Payer: BC Managed Care – PPO

## 2022-09-25 ENCOUNTER — Encounter: Payer: Self-pay | Admitting: Nurse Practitioner

## 2022-09-25 ENCOUNTER — Encounter: Payer: Self-pay | Admitting: Physician Assistant

## 2022-09-25 ENCOUNTER — Other Ambulatory Visit: Payer: Self-pay

## 2022-09-25 VITALS — BP 122/78 | HR 72 | Temp 98.4°F | Resp 17 | Wt 171.4 lb

## 2022-09-25 DIAGNOSIS — C163 Malignant neoplasm of pyloric antrum: Secondary | ICD-10-CM

## 2022-09-25 DIAGNOSIS — Z95828 Presence of other vascular implants and grafts: Secondary | ICD-10-CM

## 2022-09-25 DIAGNOSIS — Z5111 Encounter for antineoplastic chemotherapy: Secondary | ICD-10-CM | POA: Diagnosis not present

## 2022-09-25 LAB — CMP (CANCER CENTER ONLY)
ALT: 7 U/L (ref 0–44)
AST: 13 U/L — ABNORMAL LOW (ref 15–41)
Albumin: 4.1 g/dL (ref 3.5–5.0)
Alkaline Phosphatase: 78 U/L (ref 38–126)
Anion gap: 6 (ref 5–15)
BUN: 12 mg/dL (ref 6–20)
CO2: 28 mmol/L (ref 22–32)
Calcium: 9.6 mg/dL (ref 8.9–10.3)
Chloride: 106 mmol/L (ref 98–111)
Creatinine: 0.88 mg/dL (ref 0.61–1.24)
GFR, Estimated: >60 mL/min (ref >60)
Glucose, Bld: 118 mg/dL — ABNORMAL HIGH (ref 70–99)
Potassium: 3.7 mmol/L (ref 3.5–5.1)
Sodium: 140 mmol/L (ref 135–145)
Total Bilirubin: 0.4 mg/dL (ref 0.3–1.2)
Total Protein: 7.1 g/dL (ref 6.5–8.1)

## 2022-09-25 LAB — CBC WITH DIFFERENTIAL (CANCER CENTER ONLY)
Abs Immature Granulocytes: 0.04 10*3/uL (ref 0.00–0.07)
Basophils Absolute: 0.1 10*3/uL (ref 0.0–0.1)
Basophils Relative: 1 %
Eosinophils Absolute: 0.1 10*3/uL (ref 0.0–0.5)
Eosinophils Relative: 2 %
HCT: 33.3 % — ABNORMAL LOW (ref 39.0–52.0)
Hemoglobin: 11 g/dL — ABNORMAL LOW (ref 13.0–17.0)
Immature Granulocytes: 1 %
Lymphocytes Relative: 34 %
Lymphs Abs: 2 10*3/uL (ref 0.7–4.0)
MCH: 28.5 pg (ref 26.0–34.0)
MCHC: 33 g/dL (ref 30.0–36.0)
MCV: 86.3 fL (ref 80.0–100.0)
Monocytes Absolute: 0.5 10*3/uL (ref 0.1–1.0)
Monocytes Relative: 9 %
Neutro Abs: 3 10*3/uL (ref 1.7–7.7)
Neutrophils Relative %: 53 %
Platelet Count: 212 10*3/uL (ref 150–400)
RBC: 3.86 MIL/uL — ABNORMAL LOW (ref 4.22–5.81)
RDW: 16.9 % — ABNORMAL HIGH (ref 11.5–15.5)
WBC Count: 5.8 10*3/uL (ref 4.0–10.5)
nRBC: 0 % (ref 0.0–0.2)

## 2022-09-25 LAB — CEA (IN HOUSE-CHCC): CEA (CHCC-In House): 3023.04 ng/mL — ABNORMAL HIGH (ref 0.00–5.00)

## 2022-09-25 MED ORDER — SODIUM CHLORIDE 0.9 % IV SOLN
2400.0000 mg/m2 | INTRAVENOUS | Status: DC
  Administered 2022-09-25: 5000 mg via INTRAVENOUS
  Filled 2022-09-25: qty 100

## 2022-09-25 MED ORDER — SODIUM CHLORIDE 0.9 % IV SOLN
180.0000 mg/m2 | Freq: Once | INTRAVENOUS | Status: AC
  Administered 2022-09-25: 340 mg via INTRAVENOUS
  Filled 2022-09-25: qty 15

## 2022-09-25 MED ORDER — ATROPINE SULFATE 1 MG/ML IV SOLN
0.5000 mg | Freq: Once | INTRAVENOUS | Status: DC | PRN
  Filled 2022-09-25: qty 1

## 2022-09-25 MED ORDER — SODIUM CHLORIDE 0.9% FLUSH
10.0000 mL | Freq: Once | INTRAVENOUS | Status: AC
  Administered 2022-09-25: 10 mL

## 2022-09-25 MED ORDER — SODIUM CHLORIDE 0.9 % IV SOLN: Freq: Once | INTRAVENOUS | Status: AC

## 2022-09-25 MED ORDER — PALONOSETRON HCL INJECTION 0.25 MG/5ML
0.2500 mg | Freq: Once | INTRAVENOUS | Status: AC
  Administered 2022-09-25: 0.25 mg via INTRAVENOUS
  Filled 2022-09-25: qty 5

## 2022-09-25 MED ORDER — SODIUM CHLORIDE 0.9 % IV SOLN
400.0000 mg/m2 | Freq: Once | INTRAVENOUS | Status: AC
  Administered 2022-09-25: 760 mg via INTRAVENOUS
  Filled 2022-09-25: qty 38

## 2022-09-25 MED ORDER — SODIUM CHLORIDE 0.9 % IV SOLN
10.0000 mg | Freq: Once | INTRAVENOUS | Status: AC
  Administered 2022-09-25: 10 mg via INTRAVENOUS
  Filled 2022-09-25: qty 10

## 2022-09-25 NOTE — Telephone Encounter (Signed)
Open by mistake

## 2022-09-25 NOTE — Patient Instructions (Signed)
[place] [facility]  Discharge Instructions: Thank you for choosing [facility] to provide your oncology and hematology care.   If you have a lab appointment with the Cancer Center, please go directly to the Cancer Center and check in at the registration area.   Wear comfortable clothing and clothing appropriate for easy access to any Portacath or PICC line.   We strive to give you quality time with your provider. You may need to reschedule your appointment if you arrive late (15 or more minutes).  Arriving late affects you and other patients whose appointments are after yours.  Also, if you miss three or more appointments without notifying the office, you may be dismissed from the clinic at the provider's discretion.      For prescription refill requests, have your pharmacy contact our office and allow 72 hours for refills to be completed.    Today you received the following chemotherapy and/or immunotherapy agents: Irinotecan, Leucovorin, Fluorouracil.       To help prevent nausea and vomiting after your treatment, we encourage you to take your nausea medication as directed.  BELOW ARE SYMPTOMS THAT SHOULD BE REPORTED IMMEDIATELY: *FEVER GREATER THAN 100.4 F (38 C) OR HIGHER *CHILLS OR SWEATING *NAUSEA AND VOMITING THAT IS NOT CONTROLLED WITH YOUR NAUSEA MEDICATION *UNUSUAL SHORTNESS OF BREATH *UNUSUAL BRUISING OR BLEEDING *URINARY PROBLEMS (pain or burning when urinating, or frequent urination) *BOWEL PROBLEMS (unusual diarrhea, constipation, pain near the anus) TENDERNESS IN MOUTH AND THROAT WITH OR WITHOUT PRESENCE OF ULCERS (sore throat, sores in mouth, or a toothache) UNUSUAL RASH, SWELLING OR PAIN  UNUSUAL VAGINAL DISCHARGE OR ITCHING   Items with * indicate a potential emergency and should be followed up as soon as possible or go to the Emergency Department if any problems should occur.  Please show the CHEMOTHERAPY ALERT CARD or  IMMUNOTHERAPY ALERT CARD at check-in to the Emergency Department and triage nurse.  Should you have questions after your visit or need to cancel or reschedule your appointment, please contact [facility] [facility]  Dept: [phone]  and follow the prompts.  Office hours are 8:00 a.m. to 4:30 p.m. Monday - Friday. Please note that voicemails left after 4:00 p.m. may not be returned until the following business day.  We are closed weekends and major holidays. You have access to a nurse at all times for urgent questions. Please call the main number to the clinic Dept: [phone] and follow the prompts.   For any non-urgent questions, you may also contact your provider using MyChart. We now offer e-Visits for anyone 72 and older to request care online for non-urgent symptoms. For details visit [email or web address].   Also download the MyChart app! Go to the app store, search "MyChart", open the app, select [facility], and log in with your MyChart username and password.

## 2022-09-25 NOTE — Assessment & Plan Note (Signed)
cT3N3Mx, with hypermetabolic left supraclavicular node, and indeterminate adrenal nodule, MMR normal ypT3ypN3a, liver and nodes recurrence in 07/2022 -diagnosed 10/18/21 by EGD for 6 month f/u of gastric ulcers, showed mucosal intramucosal adenocarcinoma. MMR normal. -baseline CEA 11/01/21 significantly elevated at 1,989.58. -EUS on 11/06/21 by Dr. Dulce Sellar, staged as T3 N3 (at least stage III) -staging CT CAP 11/08/21 showed: stable gastric antrum wall thickening; increased size of upper abdominal lymph nodes; stable left adrenal nodule, 1.9 cm. -PET scan showed FDG avid lymph nodes within the gastrohepatic ligament, portacaval region, and mesentery. Tracer avid left supraclavicular lymph node and indeterminate left adrenal gland nodule with mild FDG uptake. -he underwent [place] node biopsy on 1/17 which was negative for malignant cells, but no lymphoid tissue seen on biopsy  -repeated PET on 02/07/22 after 3 months neoadjuvant chemo showed resolved left supraclavicular lymph node, and significant improvement in regional lymph nodes and the primary tumor, stable and indeterminate left adrenal nodule.  -CT adrenal protocol showed the left adrenal nodule is likely benign adenoma, no biopsy is needed. -Completed neoadjvant chemo FLOT s/p 12 cycles 11/22/21 - 04/27/2022 -restaging PET on 05/16/22 showed significantly hypermetabolic activity at the primary gastric cancer in pylorus, and a small hypermetabolic mesenteric lymph node, no other evidence of metastasis. surgeon Dr. Sheliah Hatch aware of PET findings -S/p distal gastrectomy 05/20/22, path showed ypT3N3a with clear margins, 7/18 + LNs, and lymphovascular invasion identified. There was some treatment effect in a few lymph nodes nut not in the primary tumor -due to rising tumor marker, he underwent PET scan on July 07, 2022, which unfortunately showed hypermetabolic new adenopathy in right hilar and mesentery, diffuse liver metastasis, consistent with metastatic  recurrence.  I personally reviewed the PET scan images with patient -Unfortunately his disease is not curable at this stage. -I recommend second line chemotherapy with paclitaxel and ramucirumab --Chemotherapy consent: Side effects including but does not not limited to, fatigue, nausea, vomiting, diarrhea, hair loss, neuropathy, fluid retention, renal and kidney dysfunction, neutropenic fever, needed for blood transfusion, bleeding, were discussed with patient in great detail. He agrees to proceed. -The goal of chemotherapy is palliative, to prolong his life -I discussed his next January sequencing foundation one result, which showed EGFR amplification, no other targetable mutations.  His tumor was previously tested for HER2 which was negative (0-1+).  PD-L1 CPS score was 2%, no significant benefit of immunotherapy.  -He started treatment on 07/25/2022  Due to his pre-existing neuropathy, will give paclitaxel and ramucirumab every 2 weeks.  He developed worsening neuropathy quickly, and I had to switch his treatment to FOLFIRI on 08/29/2022, every 2 weeks

## 2022-09-27 ENCOUNTER — Other Ambulatory Visit: Payer: Self-pay

## 2022-09-27 ENCOUNTER — Inpatient Hospital Stay: Payer: BC Managed Care – PPO

## 2022-09-27 VITALS — BP 133/86 | HR 73 | Temp 97.9°F | Resp 13

## 2022-09-27 DIAGNOSIS — C163 Malignant neoplasm of pyloric antrum: Secondary | ICD-10-CM

## 2022-09-27 DIAGNOSIS — Z5111 Encounter for antineoplastic chemotherapy: Secondary | ICD-10-CM | POA: Diagnosis not present

## 2022-09-27 MED ORDER — HEPARIN SOD (PORK) LOCK FLUSH 100 UNIT/ML IV SOLN
500.0000 [IU] | Freq: Once | INTRAVENOUS | Status: AC | PRN
  Administered 2022-09-27: 500 [IU]

## 2022-09-27 MED ORDER — SODIUM CHLORIDE 0.9% FLUSH
10.0000 mL | INTRAVENOUS | Status: DC | PRN
  Administered 2022-09-27: 10 mL

## 2022-10-06 ENCOUNTER — Encounter (HOSPITAL_COMMUNITY): Payer: Self-pay

## 2022-10-06 ENCOUNTER — Ambulatory Visit (HOSPITAL_COMMUNITY)
Admission: RE | Admit: 2022-10-06 | Discharge: 2022-10-06 | Disposition: A | Discharge status: Home / Self Care | Payer: BC Managed Care – PPO | Source: Ambulatory Visit | Attending: Nurse Practitioner | Admitting: Nurse Practitioner

## 2022-10-06 DIAGNOSIS — C163 Malignant neoplasm of pyloric antrum: Secondary | ICD-10-CM | POA: Diagnosis present

## 2022-10-06 MED ORDER — IOHEXOL 300 MG/ML  SOLN
100.0000 mL | Freq: Once | INTRAMUSCULAR | Status: AC | PRN
  Administered 2022-10-06: 100 mL via INTRAVENOUS

## 2022-10-08 MED FILL — Dexamethasone Sodium Phosphate Inj 100 MG/10ML: INTRAMUSCULAR | Qty: 1 | Status: AC

## 2022-10-08 NOTE — Assessment & Plan Note (Signed)
cT3N3Mx, with hypermetabolic left supraclavicular node, and indeterminate adrenal nodule, MMR normal ypT3ypN3a, liver and nodes recurrence in 07/2022 -diagnosed 10/18/21 by EGD for 6 month f/u of gastric ulcers, showed mucosal intramucosal adenocarcinoma. MMR normal. -baseline CEA 11/01/21 significantly elevated at 1,989.58. -EUS on 11/06/21 by Dr. Dulce Sellar, staged as T3 N3 (at least stage III) -staging CT CAP 11/08/21 showed: stable gastric antrum wall thickening; increased size of upper abdominal lymph nodes; stable left adrenal nodule, 1.9 cm. -PET scan showed FDG avid lymph nodes within the gastrohepatic ligament, portacaval region, and mesentery. Tracer avid left supraclavicular lymph node and indeterminate left adrenal gland nodule with mild FDG uptake. -he underwent [place] node biopsy on 1/17 which was negative for malignant cells, but no lymphoid tissue seen on biopsy  -repeated PET on 02/07/22 after 3 months neoadjuvant chemo showed resolved left supraclavicular lymph node, and significant improvement in regional lymph nodes and the primary tumor, stable and indeterminate left adrenal nodule.  -CT adrenal protocol showed the left adrenal nodule is likely benign adenoma, no biopsy is needed. -Completed neoadjvant chemo FLOT s/p 12 cycles 11/22/21 - 04/27/2022 -restaging PET on 05/16/22 showed significantly hypermetabolic activity at the primary gastric cancer in pylorus, and a small hypermetabolic mesenteric lymph node, no other evidence of metastasis. surgeon Dr. Sheliah Hatch aware of PET findings -S/p distal gastrectomy 05/20/22, path showed ypT3N3a with clear margins, 7/18 + LNs, and lymphovascular invasion identified. There was some treatment effect in a few lymph nodes nut not in the primary tumor -due to rising tumor marker, he underwent PET scan on July 07, 2022, which unfortunately showed hypermetabolic new adenopathy in right hilar and mesentery, diffuse liver metastasis, consistent with metastatic  recurrence.  I personally reviewed the PET scan images with patient -Unfortunately his disease is not curable at this stage. -I recommend second line chemotherapy with paclitaxel and ramucirumab --Chemotherapy consent: Side effects including but does not not limited to, fatigue, nausea, vomiting, diarrhea, hair loss, neuropathy, fluid retention, renal and kidney dysfunction, neutropenic fever, needed for blood transfusion, bleeding, were discussed with patient in great detail. He agrees to proceed. -The goal of chemotherapy is palliative, to prolong his life -I discussed his next January sequencing foundation one result, which showed EGFR amplification, no other targetable mutations.  His tumor was previously tested for HER2 which was negative (0-1+).  PD-L1 CPS score was 2%, no significant benefit of immunotherapy.  -He started treatment on 07/25/2022  Due to his pre-existing neuropathy, will give paclitaxel and ramucirumab every 2 weeks.  He developed worsening neuropathy quickly, and I had to switch his treatment to FOLFIRI on 08/29/2022, every 2 weeks

## 2022-10-09 ENCOUNTER — Inpatient Hospital Stay: Payer: BC Managed Care – PPO

## 2022-10-09 ENCOUNTER — Inpatient Hospital Stay (HOSPITAL_BASED_OUTPATIENT_CLINIC_OR_DEPARTMENT_OTHER): Payer: BC Managed Care – PPO | Attending: Physician Assistant | Admitting: Hematology

## 2022-10-09 ENCOUNTER — Inpatient Hospital Stay: Payer: BC Managed Care – PPO | Attending: Physician Assistant

## 2022-10-09 VITALS — BP 132/75 | HR 74 | Temp 98.2°F | Resp 14 | Ht 65.0 in | Wt 174.5 lb

## 2022-10-09 DIAGNOSIS — T451X5D Adverse effect of antineoplastic and immunosuppressive drugs, subsequent encounter: Secondary | ICD-10-CM | POA: Insufficient documentation

## 2022-10-09 DIAGNOSIS — R970 Elevated carcinoembryonic antigen [CEA]: Secondary | ICD-10-CM | POA: Diagnosis not present

## 2022-10-09 DIAGNOSIS — C787 Secondary malignant neoplasm of liver and intrahepatic bile duct: Secondary | ICD-10-CM | POA: Insufficient documentation

## 2022-10-09 DIAGNOSIS — G620 Drug-induced polyneuropathy: Secondary | ICD-10-CM | POA: Diagnosis not present

## 2022-10-09 DIAGNOSIS — C163 Malignant neoplasm of pyloric antrum: Secondary | ICD-10-CM | POA: Diagnosis not present

## 2022-10-09 DIAGNOSIS — R978 Other abnormal tumor markers: Secondary | ICD-10-CM | POA: Insufficient documentation

## 2022-10-09 DIAGNOSIS — Z9221 Personal history of antineoplastic chemotherapy: Secondary | ICD-10-CM | POA: Diagnosis not present

## 2022-10-09 DIAGNOSIS — E669 Obesity, unspecified: Secondary | ICD-10-CM | POA: Insufficient documentation

## 2022-10-09 DIAGNOSIS — Z5111 Encounter for antineoplastic chemotherapy: Secondary | ICD-10-CM | POA: Diagnosis present

## 2022-10-09 DIAGNOSIS — D500 Iron deficiency anemia secondary to blood loss (chronic): Secondary | ICD-10-CM | POA: Diagnosis not present

## 2022-10-09 DIAGNOSIS — E278 Other specified disorders of adrenal gland: Secondary | ICD-10-CM | POA: Diagnosis not present

## 2022-10-09 DIAGNOSIS — Z7952 Long term (current) use of systemic steroids: Secondary | ICD-10-CM | POA: Insufficient documentation

## 2022-10-09 LAB — CBC WITH DIFFERENTIAL (CANCER CENTER ONLY)
Abs Immature Granulocytes: 0.02 10*3/uL (ref 0.00–0.07)
Basophils Absolute: 0 10*3/uL (ref 0.0–0.1)
Basophils Relative: 1 %
Eosinophils Absolute: 0.2 10*3/uL (ref 0.0–0.5)
Eosinophils Relative: 3 %
HCT: 33 % — ABNORMAL LOW (ref 39.0–52.0)
Hemoglobin: 10.7 g/dL — ABNORMAL LOW (ref 13.0–17.0)
Immature Granulocytes: 0 %
Lymphocytes Relative: 32 %
Lymphs Abs: 1.9 10*3/uL (ref 0.7–4.0)
MCH: 28.1 pg (ref 26.0–34.0)
MCHC: 32.4 g/dL (ref 30.0–36.0)
MCV: 86.6 fL (ref 80.0–100.0)
Monocytes Absolute: 0.6 10*3/uL (ref 0.1–1.0)
Monocytes Relative: 11 %
Neutro Abs: 3 10*3/uL (ref 1.7–7.7)
Neutrophils Relative %: 53 %
Platelet Count: 218 10*3/uL (ref 150–400)
RBC: 3.81 MIL/uL — ABNORMAL LOW (ref 4.22–5.81)
RDW: 17.2 % — ABNORMAL HIGH (ref 11.5–15.5)
WBC Count: 5.7 10*3/uL (ref 4.0–10.5)
nRBC: 0 % (ref 0.0–0.2)

## 2022-10-09 LAB — CMP (CANCER CENTER ONLY)
ALT: 7 U/L (ref 0–44)
AST: 15 U/L (ref 15–41)
Albumin: 3.9 g/dL (ref 3.5–5.0)
Alkaline Phosphatase: 71 U/L (ref 38–126)
Anion gap: 4 — ABNORMAL LOW (ref 5–15)
BUN: 9 mg/dL (ref 6–20)
CO2: 29 mmol/L (ref 22–32)
Calcium: 9.5 mg/dL (ref 8.9–10.3)
Chloride: 108 mmol/L (ref 98–111)
Creatinine: 0.85 mg/dL (ref 0.61–1.24)
GFR, Estimated: >60 mL/min (ref >60)
Glucose, Bld: 112 mg/dL — ABNORMAL HIGH (ref 70–99)
Potassium: 3.9 mmol/L (ref 3.5–5.1)
Sodium: 141 mmol/L (ref 135–145)
Total Bilirubin: 0.5 mg/dL (ref 0.3–1.2)
Total Protein: 6.6 g/dL (ref 6.5–8.1)

## 2022-10-09 MED ORDER — PALONOSETRON HCL INJECTION 0.25 MG/5ML
0.2500 mg | Freq: Once | INTRAVENOUS | Status: AC
  Administered 2022-10-09: 0.25 mg via INTRAVENOUS
  Filled 2022-10-09: qty 5

## 2022-10-09 MED ORDER — SODIUM CHLORIDE 0.9% FLUSH: 10.0000 mL | INTRAVENOUS | Status: DC | PRN

## 2022-10-09 MED ORDER — SODIUM CHLORIDE 0.9 % IV SOLN
180.0000 mg/m2 | Freq: Once | INTRAVENOUS | Status: AC
  Administered 2022-10-09: 340 mg via INTRAVENOUS
  Filled 2022-10-09: qty 15

## 2022-10-09 MED ORDER — SODIUM CHLORIDE 0.9 % IV SOLN
10.0000 mg | Freq: Once | INTRAVENOUS | Status: AC
  Administered 2022-10-09: 10 mg via INTRAVENOUS
  Filled 2022-10-09: qty 10

## 2022-10-09 MED ORDER — SODIUM CHLORIDE 0.9 % IV SOLN
2400.0000 mg/m2 | INTRAVENOUS | Status: DC
  Administered 2022-10-09: 5000 mg via INTRAVENOUS
  Filled 2022-10-09: qty 100

## 2022-10-09 MED ORDER — DEXAMETHASONE 4 MG PO TABS: 4.0000 mg | ORAL_TABLET | Freq: Every day | ORAL | 1 refills | Status: DC

## 2022-10-09 MED ORDER — HEPARIN SOD (PORK) LOCK FLUSH 100 UNIT/ML IV SOLN: 500.0000 [IU] | Freq: Once | INTRAVENOUS | Status: DC | PRN

## 2022-10-09 MED ORDER — SODIUM CHLORIDE 0.9 % IV SOLN: Freq: Once | INTRAVENOUS | Status: AC

## 2022-10-09 MED ORDER — SODIUM CHLORIDE 0.9 % IV SOLN
400.0000 mg/m2 | Freq: Once | INTRAVENOUS | Status: AC
  Administered 2022-10-09: 760 mg via INTRAVENOUS
  Filled 2022-10-09: qty 25

## 2022-10-09 NOTE — Patient Instructions (Signed)
[facility]  Discharge Instructions: Thank you for choosing [facility] to provide your oncology and hematology care.   If you have a lab appointment with the [facility], please go directly to the [facility] and check in at the registration area.   Wear comfortable clothing and clothing appropriate for easy access to any Portacath or PICC line.   We strive to give you quality time with your provider. You may need to reschedule your appointment if you arrive late (15 or more minutes).  Arriving late affects you and other patients whose appointments are after yours.  Also, if you miss three or more appointments without notifying the office, you may be dismissed from the clinic at the provider's discretion.      For prescription refill requests, have your pharmacy contact our office and allow 72 hours for refills to be completed.    Today you received the following chemotherapy and/or immunotherapy agents: Irinotecan, Leucovorin, Fluorouracil.       To help prevent nausea and vomiting after your treatment, we encourage you to take your nausea medication as directed.  BELOW ARE SYMPTOMS THAT SHOULD BE REPORTED IMMEDIATELY: *FEVER GREATER THAN 100.4 F (38 C) OR HIGHER *CHILLS OR SWEATING *NAUSEA AND VOMITING THAT IS NOT CONTROLLED WITH YOUR NAUSEA MEDICATION *UNUSUAL SHORTNESS OF BREATH *UNUSUAL BRUISING OR BLEEDING *URINARY PROBLEMS (pain or burning when urinating, or frequent urination) *BOWEL PROBLEMS (unusual diarrhea, constipation, pain near the anus) TENDERNESS IN MOUTH AND THROAT WITH OR WITHOUT PRESENCE OF ULCERS (sore throat, sores in mouth, or a toothache) UNUSUAL RASH, SWELLING OR PAIN  UNUSUAL VAGINAL DISCHARGE OR ITCHING   Items with * indicate a potential emergency and should be followed up as soon as possible or go to the Emergency Department if any problems should occur.  Please show the CHEMOTHERAPY ALERT CARD or  IMMUNOTHERAPY ALERT CARD at check-in to the Emergency Department and triage nurse.  Should you have questions after your visit or need to cancel or reschedule your appointment, please contact [facility]  Dept: [phone]  and follow the prompts.  Office hours are 8:00 a.m. to 4:30 p.m. Monday - Friday. Please note that voicemails left after 4:00 p.m. may not be returned until the following business day.  We are closed weekends and major holidays. You have access to a nurse at all times for urgent questions. Please call the main number to the clinic Dept: [phone] and follow the prompts.   For any non-urgent questions, you may also contact your provider using MyChart. We now offer e-Visits for anyone 25 and older to request care online for non-urgent symptoms. For details visit [email or web address].   Also download the MyChart app! Go to the app store, search "MyChart", open the app, select [name], and log in with your MyChart username and password.

## 2022-10-09 NOTE — Progress Notes (Signed)
[facility]   Telephone:([phone] Fax:([phone]   Clinic Follow up Note   Patient Care Team: Pcp, No as PCP - General Malachy Mood, MD as Consulting Physician (Hematology and Oncology)  Date of Service:  10/09/2022  CHIEF COMPLAINT: f/u of metastatic gastric cancer  CURRENT THERAPY:  Third line chemotherapy FOLFIRI  Oncology History   Gastric cancer (HCC) cT3N3Mx, with hypermetabolic left supraclavicular node, and indeterminate adrenal nodule, MMR normal ypT3ypN3a, liver and nodes recurrence in 07/2022 -diagnosed 10/18/21 by EGD for 6 month f/u of gastric ulcers, showed mucosal intramucosal adenocarcinoma. MMR normal. -baseline CEA 11/01/21 significantly elevated at 1,989.58. -EUS on 11/06/21 by Dr. Dulce Sellar, staged as T3 N3 (at least stage III) -staging CT CAP 11/08/21 showed: stable gastric antrum wall thickening; increased size of upper abdominal lymph nodes; stable left adrenal nodule, 1.9 cm. -PET scan showed FDG avid lymph nodes within the gastrohepatic ligament, portacaval region, and mesentery. Tracer avid left supraclavicular lymph node and indeterminate left adrenal gland nodule with mild FDG uptake. -he underwent [place] node biopsy on 1/17 which was negative for malignant cells, but no lymphoid tissue seen on biopsy  -repeated PET on 2/2/Jonathan after 3 months neoadjuvant chemo showed resolved left supraclavicular lymph node, and significant improvement in regional lymph nodes and the primary tumor, stable and indeterminate left adrenal nodule.  -CT adrenal protocol showed the left adrenal nodule is likely benign adenoma, no biopsy is needed. -Completed neoadjvant chemo FLOT s/p 12 cycles 11/22/21 - 04/27/2022 -restaging PET on 5/10/Jonathan showed significantly hypermetabolic activity at the primary gastric cancer in pylorus, and a small hypermetabolic mesenteric lymph node, no other evidence of metastasis. surgeon Dr. Sheliah Hatch aware of PET findings -S/p distal gastrectomy  5/14/Jonathan, path showed ypT3N3a with clear margins, 7/18 + LNs, and lymphovascular invasion identified. There was some treatment effect in a few lymph nodes nut not in the primary tumor -due to rising tumor marker, he underwent PET scan on July 07, 2022, which unfortunately showed hypermetabolic new adenopathy in right hilar and mesentery, diffuse liver metastasis, consistent with metastatic recurrence.  I personally reviewed the PET scan images with patient -Unfortunately his disease is not curable at this stage. -I recommend second line chemotherapy with paclitaxel and ramucirumab --Chemotherapy consent: Side effects including but does not not limited to, fatigue, nausea, vomiting, diarrhea, hair loss, neuropathy, fluid retention, renal and kidney dysfunction, neutropenic fever, needed for blood transfusion, bleeding, were discussed with patient in great detail. He agrees to proceed. -The goal of chemotherapy is palliative, to prolong his life -I discussed his next January sequencing foundation one result, which showed EGFR amplification, no other targetable mutations.  His tumor was previously tested for HER2 which was negative (0-1+).  PD-L1 CPS score was 2%, no significant benefit of immunotherapy.  -He started treatment on 07/25/2022  Due to his pre-existing neuropathy, will give paclitaxel and ramucirumab every 2 weeks.  He developed worsening neuropathy quickly, and I had to switch his treatment to FOLFIRI on 08/29/2022, every 2 weeks     Assessment and Plan    Metastatic Gastric Cancer Tolerating chemotherapy well with no reported side effects. Tumor marker is decreasing and preliminary review of recent CT scan my self suggests possible reduction in liver lesions. The formal radiology report is not back yet. -Continue current chemotherapy regimen. -Refill Dexamethasone prescription with 20 tablets to [facility] on [address] [facility]. -Plan for chemotherapy breaks during Thanksgiving, Christmas,  and New Year.  Neuropathy Reports numbness in both feet and hands,  but pain is tolerable and does not interfere with work duties. -Continue current Insurance account manager.  Dental Health Lost a tooth, possibly related to chemotherapy. Has not seen a dentist in a long time and currently does not have dental insurance. -Encouraged to include dental insurance in upcoming enrollment. -Advised to use regular mouthwash and maintain oral hygiene.  Plan -Lab reviewed, adequate for treatment, will proceed cycle 4 chemotherapy at the same dose -Lab and follow-up in 2 weeks before cycle 5 chemo    SUMMARY OF ONCOLOGIC HISTORY: Oncology History Overview Note   Cancer Staging  Gastric cancer [facility]) Staging form: Stomach, AJCC 8th Edition - Clinical stage from 10/18/2021: Stage III (cT3, cN3a, cM0) - Signed by Malachy Mood, MD on 11/10/2021     Gastric cancer (HCC)  10/18/2021 Procedure   EGD performed under the care of Dr. Bosie Clos  Findings:  -A large, ulcerated, partially circumferential (involving two thirds of the lumen circumference) mass with no bleeding and stigmata of recent bleeding was found in the gastric antrum, at the pylorus and in the prepyloric region of the stomach. -Segmental severe inflammation characterized by congestion (edema) and erythema was found in the gastric antrum.   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 Negative for dysplasia.  Negative for helicobacter pylori  Mismatch Repair (MMR) Protein Imunohistochemistry (IHC): IHC Expression Result: MLH1:  Preserved nuclear expression. MSH2: Preserved nuclear expression. MSH6: Preserved nuclear expression. PMS2: Preserved nuclear expression. Interpretation: NORMAL   10/31/2021 Initial Diagnosis   Gastric cancer (HCC)   11/01/2021 Tumor Marker   CEA = 3,086.57 (^)   11/06/2021 Procedure   Upper EUS, Dr. Dulce Sellar  Impression: - Normal esophagus. - A small amount of food (residue) in the stomach. - Congested, friable (with contact bleeding) and ulcerated mucosa in the prepyloric region of the stomach. - Normal examined duodenum. - There was no evidence of significant pathology in the left lobe of the liver. - Many abnormal lymph nodes (over 10) were visualized in the gastrohepatic ligament (level 18), celiac region (level 20), perigastric region, peripancreatic region and aortocaval region. - Wall thickening was seen in the prepyloric region of the stomach. The thickening appeared to be primarily within the deep mucosa (Layer 2) but extended through the muscularis propria. - Overall constellation of findings consistent with at least T3 N3 Mx (at least stage III) gastric adenocarcinoma.   11/08/2021 Imaging   EXAM: CT CHEST, ABDOMEN, AND PELVIS WITH CONTRAST  IMPRESSION: 1. Similar irregular wall thickening of the gastric antrum, consistent with patient's known primary gastric neoplasm. 2. Increased size of the upper abdominal lymph nodes, concerning for worsening nodal disease involvement. 3. Stable left adrenal nodule common nonspecific possibly a metastatic lesion or adenoma. 4. No evidence of metastatic disease in the chest. 5. Left-sided colonic diverticulosis without findings of acute diverticulitis. 6. Enlarged prostate gland. 7.  Aortic Atherosclerosis (ICD10-I70.0).   11/22/2021 - 04/26/2022 Chemotherapy   Patient is on Treatment Plan : GASTROESOPHAGEAL FLOT q14d X 4 cycles     11/27/2021 Imaging    IMPRESSION: 1. The mass within the gastric antrum is mildly decreased in  size when compared with 11/08/2021. There  is persistent FDG uptake associated with this mass compatible with residual tumor. 2. Persistent FDG avid lymph nodes within the gastrohepatic ligament, portacaval region, and mesentery. These are mildly decreased in size when compared with 11/08/2021. 3. Tracer avid left supraclavicular lymph node is also mildly decreased in size when compared with 11/08/2021. 4. Indeterminate left adrenal gland nodule with mild FDG uptake. This may represent a lipid poor adenoma. Metastatic disease not exclude. 5. Diffuse increased uptake throughout the bone marrow is favored to represent treatment related change. 6.  Aortic Atherosclerosis (ICD10-I70.0).     02/07/2022 Imaging    IMPRESSION: 1. No substantial change in hypermetabolism associated with the distal gastric lesion. Although difficult to discern on noncontrast CT imaging, the soft tissue component appears to be decreased since the prior PET-CT. 2. Interval resolution of the hypermetabolic left supraclavicular lymph node. 3. Interval marked decrease of the hypermetabolic gastrohepatic ligament lymphadenopathy. Similar decrease in size and hypermetabolism associated with index nodes identified previously in the 4. porta hepatis and hypogastric region. 5. Stable left adrenal nodule with slight hypermetabolism. Nonspecific finding could represent lipid poor adenoma or metastatic deposit. 6. No new suspicious hypermetabolic disease on today's study. 7.  Aortic Atherosclerosis (ICD10-I70.0).   07/25/2022 - 08/15/2022 Chemotherapy   Patient is on Treatment Plan : GASTROESOPHAGEAL Ramucirumab D1, 15 + Paclitaxel D1,8,15 q28d     08/29/2022 -  Chemotherapy   Patient is on Treatment Plan : COLORECTAL FOLFIRI q14d        Discussed the use of AI scribe software for clinical note transcription with the patient, who gave verbal consent to proceed.  History of Present Illness   The patient, a  52 year Maynard with metastatic gastric cancer, is currently undergoing chemotherapy. He reports neuropathy as a side effect of the treatment, with numbness in both feet and hands. The numbness has improved but is still present. The patient describes the pain as tolerable and states that it does not interfere with his work, which has been adjusted to accommodate his chemotherapy schedule. The patient also reports needing a refill on his dexamethasone prescription, which he takes after his chemotherapy pump is removed. The patient recently lost a tooth, which he attributes to the chemotherapy. He has not seen a dentist in a long time and plans to include dental insurance in his coverage next year. The patient is not currently using any pain medication.         All other systems were reviewed with the patient and are negative.  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    SURGICAL HISTORY: Past Surgical History:  Procedure Laterality Date   BIOPSY  04/12/2021   Procedure: BIOPSY;  Surgeon: Charlott Rakes, MD;  Location: [facility];  Service: Gastroenterology;;   ESOPHAGOGASTRODUODENOSCOPY N/A 04/12/2021   Procedure: ESOPHAGOGASTRODUODENOSCOPY (EGD);  Surgeon: Charlott Rakes, MD;  Location: [facility];  Service: Gastroenterology;  Laterality: N/A;   ESOPHAGOGASTRODUODENOSCOPY N/A 11/06/2021   Procedure: ESOPHAGOGASTRODUODENOSCOPY (EGD);  Surgeon: Willis Modena, MD;  Location: [facility];  Service: Gastroenterology;  Laterality: N/A;   GASTRECTOMY N/A 05/20/2022   Procedure: OPEN PARTIAL GASTRECTOMY WITH GASTROJEJUNAL ANASTOMOSIS;  Surgeon: Sheliah Hatch De Blanch, MD;  Location: [facility];  Service: General;  Laterality: N/A;   HERNIA REPAIR Left    inguinal   IR IMAGING GUIDED PORT INSERTION  11/15/2021   LAPAROSCOPY N/A 05/20/2022   Procedure: LAPAROSCOPY DIAGNOSTIC;  Surgeon: Sheliah Hatch De Blanch, MD;  Location: [facility];  Service: General;  Laterality: N/A;   UPPER ESOPHAGEAL ENDOSCOPIC ULTRASOUND (EUS) Bilateral 11/06/2021   Procedure: UPPER ESOPHAGEAL ENDOSCOPIC ULTRASOUND (EUS);  Surgeon: Willis Modena, MD;  Location: [facility];  Service: Gastroenterology;  Laterality: Bilateral;    I have reviewed the social history and family history with the patient and they are unchanged from previous note.  ALLERGIES:  has No Known Allergies.  MEDICATIONS:  Current Outpatient Medications  Medication Sig Dispense Refill   acetaminophen (TYLENOL) 500 MG tablet Take 500 mg by mouth 2 (two) times daily.     dexamethasone (DECADRON) 4 MG tablet Take 1-2 tablets (4-8 mg total) by mouth daily. Start the day after chemotherapy for 1-2 days.  Take with food. 20 tablet 1   gabapentin (NEURONTIN) 100 MG capsule Take 1-2 capsules (100-200 mg total) by mouth 4 (four) times daily as needed. OK to increase to 2 or 3 capsules in a few weeks if tolerates well 240 capsule 0   ondansetron (ZOFRAN-ODT) 4 MG disintegrating tablet Dissolve 1 tablet (4 mg total) by mouth every 6 (six) hours as needed for nausea or vomiting. 20 tablet 0   pantoprazole (PROTONIX) 40 MG tablet Take 1 tablet (40 mg) by mouth 2 times daily. 60 tablet 6   No current facility-administered medications for this visit.   Facility-Administered Medications Ordered in Other Visits  Medication Dose Route Frequency Provider Last Rate Last Admin   fluorouracil (ADRUCIL) 5,000 mg in sodium chloride 0.9 % 150 mL chemo infusion  2,400 mg/m2 (Treatment Plan Recorded) Intravenous 1 day or 1 dose Malachy Mood, MD   Infusion Verify at 10/03/Jonathan 1258   heparin lock flush 100 unit/mL  500 Units Intracatheter Once PRN Malachy Mood, MD       sodium chloride flush  (NS) 0.9 % injection 10 mL  10 mL Intracatheter PRN Malachy Mood, MD        PHYSICAL EXAMINATION: ECOG PERFORMANCE STATUS: 1 - Symptomatic but completely ambulatory  Vitals:   10/03/Jonathan 0928  BP: 132/75  Pulse: 74  Resp: 14  Temp: 98.2 F (36.8 C)  SpO2: 100%   Wt Readings from Last 3 Encounters:  10/03/Jonathan 174 lb 8 oz (79.2 kg)  09/19/Jonathan 171 lb 6.4 oz (77.7 kg)  09/05/Jonathan 171 lb 8 oz (77.8 kg)     GENERAL:alert, no distress and comfortable SKIN: skin color, texture, turgor are normal, no rashes or significant lesions EYES: normal, Conjunctiva are pink and non-injected, sclera clear NECK: supple, thyroid normal size, non-tender, without nodularity LYMPH:  no palpable lymphadenopathy in the cervical, axillary  LUNGS: clear to auscultation and percussion with normal breathing effort HEART: regular rate & rhythm and no murmurs and no lower extremity edema ABDOMEN:abdomen soft, non-tender and normal bowel sounds Musculoskeletal:no cyanosis of digits and no clubbing  NEURO: alert & oriented x 3 with fluent speech, no focal motor/sensory deficits   LABORATORY DATA:  I have reviewed the data as listed    Latest Ref Rng & Units 10/09/2022    9:10 AM 09/25/2022    8:09 AM 09/11/2022    8:13 AM  CBC  WBC 4.0 - 10.5 K/uL 5.7  5.8  5.5   Hemoglobin 13.0 - 17.0 g/dL 16.1  09.6  04.5   Hematocrit 39.0 -  52.0 % 33.0  33.3  33.4   Platelets 150 - 400 K/uL 218  212  244         Latest Ref Rng & Units 10/09/2022    9:10 AM 09/25/2022    8:09 AM 09/11/2022    8:13 AM  CMP  Glucose 70 - 99 mg/dL 409  811  83   BUN 6 - 20 mg/dL 9  12  9    Creatinine 0.61 - 1.Jonathan mg/dL 9.14  7.82  9.56   Sodium 135 - 145 mmol/L 141  140  141   Potassium 3.5 - 5.1 mmol/L 3.9  3.7  4.0   Chloride 98 - 111 mmol/L 108  106  108   CO2 22 - 32 mmol/L 29  28  28    Calcium 8.9 - 10.3 mg/dL 9.5  9.6  9.2   Total Protein 6.5 - 8.1 g/dL 6.6  7.1  6.8   Total Bilirubin 0.3 - 1.2 mg/dL 0.5  0.4  0.4   Alkaline Phos 38  - 126 U/L 71  78  92   AST 15 - 41 U/L 15  13  14    ALT 0 - 44 U/L 7  7  7        RADIOGRAPHIC STUDIES: I have personally reviewed the radiological images as listed and agreed with the findings in the report. No results found.    Orders Placed This Encounter  Procedures   CBC with Differential (Cancer Center Only)    Standing Status:   Future    Standing Expiration Date:   11/20/2023   CMP (Cancer Center only)    Standing Status:   Future    Standing Expiration Date:   11/20/2023   CBC with Differential (Cancer Center Only)    Standing Status:   Future    Standing Expiration Date:   12/11/2023   CMP (Cancer Center only)    Standing Status:   Future    Standing Expiration Date:   12/11/2023   All questions were answered. The patient knows to call the clinic with any problems, questions or concerns. No barriers to learning was detected. The total time spent in the appointment was 25 minutes.     Malachy Mood, MD 10/09/2022

## 2022-10-10 ENCOUNTER — Other Ambulatory Visit: Payer: Self-pay

## 2022-10-11 ENCOUNTER — Inpatient Hospital Stay: Payer: BC Managed Care – PPO

## 2022-10-11 VITALS — BP 110/72 | HR 78 | Temp 97.3°F | Resp 14

## 2022-10-11 DIAGNOSIS — Z5111 Encounter for antineoplastic chemotherapy: Secondary | ICD-10-CM | POA: Diagnosis not present

## 2022-10-11 DIAGNOSIS — C163 Malignant neoplasm of pyloric antrum: Secondary | ICD-10-CM

## 2022-10-11 MED ORDER — SODIUM CHLORIDE 0.9% FLUSH
10.0000 mL | INTRAVENOUS | Status: DC | PRN
  Administered 2022-10-11: 10 mL

## 2022-10-11 MED ORDER — HEPARIN SOD (PORK) LOCK FLUSH 100 UNIT/ML IV SOLN
500.0000 [IU] | Freq: Once | INTRAVENOUS | Status: AC | PRN
  Administered 2022-10-11: 500 [IU]

## 2022-10-22 MED FILL — Dexamethasone Sodium Phosphate Inj 100 MG/10ML: INTRAMUSCULAR | Qty: 1 | Status: AC

## 2022-10-22 NOTE — Assessment & Plan Note (Signed)
cT3N3Mx, with hypermetabolic left supraclavicular node, and indeterminate adrenal nodule, MMR normal ypT3ypN3a, liver and nodes recurrence in 07/2022 -diagnosed 10/18/21 by EGD for 6 month f/u of gastric ulcers, showed mucosal intramucosal adenocarcinoma. MMR normal. -baseline CEA 11/01/21 significantly elevated at 1,989.58. -EUS on 11/06/21 by Dr. Dulce Sellar, staged as T3 N3 (at least stage III) -staging CT CAP 11/08/21 showed: stable gastric antrum wall thickening; increased size of upper abdominal lymph nodes; stable left adrenal nodule, 1.9 cm. -PET scan showed FDG avid lymph nodes within the gastrohepatic ligament, portacaval region, and mesentery. Tracer avid left supraclavicular lymph node and indeterminate left adrenal gland nodule with mild FDG uptake. -he underwent [place] node biopsy on 1/17 which was negative for malignant cells, but no lymphoid tissue seen on biopsy  -repeated PET on 02/07/22 after 3 months neoadjuvant chemo showed resolved left supraclavicular lymph node, and significant improvement in regional lymph nodes and the primary tumor, stable and indeterminate left adrenal nodule.  -CT adrenal protocol showed the left adrenal nodule is likely benign adenoma, no biopsy is needed. -Completed neoadjvant chemo FLOT s/p 12 cycles 11/22/21 - 04/27/2022 -restaging PET on 05/16/22 showed significantly hypermetabolic activity at the primary gastric cancer in pylorus, and a small hypermetabolic mesenteric lymph node, no other evidence of metastasis. surgeon Dr. Sheliah Hatch aware of PET findings -S/p distal gastrectomy 05/20/22, path showed ypT3N3a with clear margins, 7/18 + LNs, and lymphovascular invasion identified. There was some treatment effect in a few lymph nodes nut not in the primary tumor -due to rising tumor marker, he underwent PET scan on July 07, 2022, which unfortunately showed hypermetabolic new adenopathy in right hilar and mesentery, diffuse liver metastasis, consistent with metastatic  recurrence.  I personally reviewed the PET scan images with patient -Unfortunately his disease is not curable at this stage. -The goal of chemotherapy is palliative, to prolong his life -I discussed his next generation sequencing foundation one result, which showed EGFR amplification, no other targetable mutations.  His tumor was previously tested for HER2 which was negative (0-1+).  PD-L1 CPS score was 2%, no significant benefit of immunotherapy.  -He started paclitaxel and ramucirumab on 07/25/2022  He developed worsening neuropathy quickly, and I had to switch his treatment to FOLFIRI on 08/29/2022, every 2 weeks, he is overall tolerating well.

## 2022-10-23 ENCOUNTER — Inpatient Hospital Stay: Payer: BC Managed Care – PPO

## 2022-10-23 ENCOUNTER — Inpatient Hospital Stay (HOSPITAL_BASED_OUTPATIENT_CLINIC_OR_DEPARTMENT_OTHER): Payer: BC Managed Care – PPO | Admitting: Hematology

## 2022-10-23 ENCOUNTER — Other Ambulatory Visit (HOSPITAL_COMMUNITY): Payer: Self-pay

## 2022-10-23 VITALS — BP 121/76 | HR 90 | Temp 98.4°F | Resp 18 | Ht 65.0 in | Wt 176.6 lb

## 2022-10-23 DIAGNOSIS — Z5111 Encounter for antineoplastic chemotherapy: Secondary | ICD-10-CM | POA: Diagnosis not present

## 2022-10-23 DIAGNOSIS — C163 Malignant neoplasm of pyloric antrum: Secondary | ICD-10-CM

## 2022-10-23 LAB — CMP (CANCER CENTER ONLY)
ALT: 8 U/L (ref 0–44)
AST: 12 U/L — ABNORMAL LOW (ref 15–41)
Albumin: 3.8 g/dL (ref 3.5–5.0)
Alkaline Phosphatase: 64 U/L (ref 38–126)
Anion gap: 5 (ref 5–15)
BUN: 9 mg/dL (ref 6–20)
CO2: 28 mmol/L (ref 22–32)
Calcium: 9.2 mg/dL (ref 8.9–10.3)
Chloride: 108 mmol/L (ref 98–111)
Creatinine: 0.81 mg/dL (ref 0.61–1.24)
GFR, Estimated: >60 mL/min (ref >60)
Glucose, Bld: 100 mg/dL — ABNORMAL HIGH (ref 70–99)
Potassium: 3.9 mmol/L (ref 3.5–5.1)
Sodium: 141 mmol/L (ref 135–145)
Total Bilirubin: 0.5 mg/dL (ref 0.3–1.2)
Total Protein: 6.5 g/dL (ref 6.5–8.1)

## 2022-10-23 LAB — CBC WITH DIFFERENTIAL (CANCER CENTER ONLY)
Abs Immature Granulocytes: 0.04 10*3/uL (ref 0.00–0.07)
Basophils Absolute: 0 10*3/uL (ref 0.0–0.1)
Basophils Relative: 1 %
Eosinophils Absolute: 0.2 10*3/uL (ref 0.0–0.5)
Eosinophils Relative: 3 %
HCT: 33.1 % — ABNORMAL LOW (ref 39.0–52.0)
Hemoglobin: 10.7 g/dL — ABNORMAL LOW (ref 13.0–17.0)
Immature Granulocytes: 1 %
Lymphocytes Relative: 29 %
Lymphs Abs: 2.1 10*3/uL (ref 0.7–4.0)
MCH: 28.2 pg (ref 26.0–34.0)
MCHC: 32.3 g/dL (ref 30.0–36.0)
MCV: 87.3 fL (ref 80.0–100.0)
Monocytes Absolute: 0.8 10*3/uL (ref 0.1–1.0)
Monocytes Relative: 11 %
Neutro Abs: 4 10*3/uL (ref 1.7–7.7)
Neutrophils Relative %: 55 %
Platelet Count: 210 10*3/uL (ref 150–400)
RBC: 3.79 MIL/uL — ABNORMAL LOW (ref 4.22–5.81)
RDW: 18.1 % — ABNORMAL HIGH (ref 11.5–15.5)
WBC Count: 7.2 10*3/uL (ref 4.0–10.5)
nRBC: 0 % (ref 0.0–0.2)

## 2022-10-23 LAB — CEA (IN HOUSE-CHCC): CEA (CHCC-In House): 639.77 ng/mL — ABNORMAL HIGH (ref 0.00–5.00)

## 2022-10-23 MED ORDER — SODIUM CHLORIDE 0.9 % IV SOLN: Freq: Once | INTRAVENOUS | Status: AC

## 2022-10-23 MED ORDER — SODIUM CHLORIDE 0.9 % IV SOLN
180.0000 mg/m2 | Freq: Once | INTRAVENOUS | Status: AC
  Administered 2022-10-23: 340 mg via INTRAVENOUS
  Filled 2022-10-23: qty 15

## 2022-10-23 MED ORDER — SODIUM CHLORIDE 0.9 % IV SOLN
10.0000 mg | Freq: Once | INTRAVENOUS | Status: AC
  Administered 2022-10-23: 10 mg via INTRAVENOUS
  Filled 2022-10-23: qty 10

## 2022-10-23 MED ORDER — SODIUM CHLORIDE 0.9 % IV SOLN
2400.0000 mg/m2 | INTRAVENOUS | Status: DC
  Administered 2022-10-23: 5000 mg via INTRAVENOUS
  Filled 2022-10-23: qty 100

## 2022-10-23 MED ORDER — PALONOSETRON HCL INJECTION 0.25 MG/5ML
0.2500 mg | Freq: Once | INTRAVENOUS | Status: AC
  Administered 2022-10-23: 0.25 mg via INTRAVENOUS
  Filled 2022-10-23: qty 5

## 2022-10-23 MED ORDER — SODIUM CHLORIDE 0.9 % IV SOLN
400.0000 mg/m2 | Freq: Once | INTRAVENOUS | Status: AC
  Administered 2022-10-23: 760 mg via INTRAVENOUS
  Filled 2022-10-23: qty 25

## 2022-10-23 NOTE — Patient Instructions (Signed)
[facility]  Discharge Instructions: Thank you for choosing [facility] to provide your oncology and hematology care.   If you have a lab appointment with the Cancer Center, please go directly to the Cancer Center and check in at the registration area.   Wear comfortable clothing and clothing appropriate for easy access to any Portacath or PICC line.   We strive to give you quality time with your provider. You may need to reschedule your appointment if you arrive late (15 or more minutes).  Arriving late affects you and other patients whose appointments are after yours.  Also, if you miss three or more appointments without notifying the office, you may be dismissed from the clinic at the provider's discretion.      For prescription refill requests, have your pharmacy contact our office and allow 72 hours for refills to be completed.    Today you received the following chemotherapy and/or immunotherapy agents: Irinotecan, Leucovorin, Fluorouracil.       To help prevent nausea and vomiting after your treatment, we encourage you to take your nausea medication as directed.  BELOW ARE SYMPTOMS THAT SHOULD BE REPORTED IMMEDIATELY: *FEVER GREATER THAN 100.4 F (38 C) OR HIGHER *CHILLS OR SWEATING *NAUSEA AND VOMITING THAT IS NOT CONTROLLED WITH YOUR NAUSEA MEDICATION *UNUSUAL SHORTNESS OF BREATH *UNUSUAL BRUISING OR BLEEDING *URINARY PROBLEMS (pain or burning when urinating, or frequent urination) *BOWEL PROBLEMS (unusual diarrhea, constipation, pain near the anus) TENDERNESS IN MOUTH AND THROAT WITH OR WITHOUT PRESENCE OF ULCERS (sore throat, sores in mouth, or a toothache) UNUSUAL RASH, SWELLING OR PAIN  UNUSUAL VAGINAL DISCHARGE OR ITCHING   Items with * indicate a potential emergency and should be followed up as soon as possible or go to the Emergency Department if any problems should occur.  Please show the CHEMOTHERAPY ALERT CARD or  IMMUNOTHERAPY ALERT CARD at check-in to the Emergency Department and triage nurse.  Should you have questions after your visit or need to cancel or reschedule your appointment, please contact [facility]  Dept: [phone]  and follow the prompts.  Office hours are 8:00 a.m. to 4:30 p.m. Monday - Friday. Please note that voicemails left after 4:00 p.m. may not be returned until the following business day.  We are closed weekends and major holidays. You have access to a nurse at all times for urgent questions. Please call the main number to the clinic Dept: [phone] and follow the prompts.   For any non-urgent questions, you may also contact your provider using MyChart. We now offer e-Visits for anyone 36 and older to request care online for non-urgent symptoms. For details visit [email or web address].   Also download the MyChart app! Go to the app store, search "MyChart", open the app, select [facility], and log in with your MyChart username and password.

## 2022-10-23 NOTE — Progress Notes (Signed)
[facility]   Telephone:([phone] [phone]   Clinic Follow up Note   Patient Care Team: Pcp, No as PCP - Serita Grit, MD as Consulting Physician (Hematology and Oncology)  Date of Service:  10/23/2022  CHIEF COMPLAINT: f/u of metastatic gastric cancer  CURRENT THERAPY:  Third line FOLFIRI every 2 weeks  Oncology History   Gastric cancer (HCC) cT3N3Mx, with hypermetabolic left supraclavicular node, and indeterminate adrenal nodule, MMR normal ypT3ypN3a, liver and nodes recurrence in 07/2022 -diagnosed 10/18/21 by EGD for 6 month f/u of gastric ulcers, showed mucosal intramucosal adenocarcinoma. MMR normal. -baseline CEA 11/01/21 significantly elevated at 1,989.58. -EUS on 11/06/21 by Dr. Dulce Sellar, staged as T3 N3 (at least stage III) -staging CT CAP 11/08/21 showed: stable gastric antrum wall thickening; increased size of upper abdominal lymph nodes; stable left adrenal nodule, 1.9 cm. -PET scan showed FDG avid lymph nodes within the gastrohepatic ligament, portacaval region, and mesentery. Tracer avid left supraclavicular lymph node and indeterminate left adrenal gland nodule with mild FDG uptake. -he underwent [place] node biopsy on 1/17 which was negative for malignant cells, but no lymphoid tissue seen on biopsy  -repeated PET on 02/07/22 after 3 months neoadjuvant chemo showed resolved left supraclavicular lymph node, and significant improvement in regional lymph nodes and the primary tumor, stable and indeterminate left adrenal nodule.  -CT adrenal protocol showed the left adrenal nodule is likely benign adenoma, no biopsy is needed. -Completed neoadjvant chemo FLOT s/p 12 cycles 11/22/21 - 04/27/2022 -restaging PET on 05/16/22 showed significantly hypermetabolic activity at the primary gastric cancer in pylorus, and a small hypermetabolic mesenteric lymph node, no other evidence of metastasis. surgeon Dr. Sheliah Hatch aware of PET findings -S/p distal  gastrectomy 05/20/22, path showed ypT3N3a with clear margins, 7/18 + LNs, and lymphovascular invasion identified. There was some treatment effect in a few lymph nodes nut not in the primary tumor -due to rising tumor marker, he underwent PET scan on July 07, 2022, which unfortunately showed hypermetabolic new adenopathy in right hilar and mesentery, diffuse liver metastasis, consistent with metastatic recurrence.  I personally reviewed the PET scan images with patient -Unfortunately his disease is not curable at this stage. -The goal of chemotherapy is palliative, to prolong his life -I discussed his next generation sequencing foundation one result, which showed EGFR amplification, no other targetable mutations.  His tumor was previously tested for HER2 which was negative (0-1+).  PD-L1 CPS score was 2%, no significant benefit of immunotherapy.  -He started paclitaxel and ramucirumab on 07/25/2022  He developed worsening neuropathy quickly, and I had to switch his treatment to FOLFIRI on 08/29/2022, every 2 weeks, he is overall tolerating well.   Assessment and Plan    Gastric Cancer Currently on chemotherapy with a recent decrease in tumor markers from 4600 to 3100, indicating a positive response to treatment. No significant side effects reported from chemotherapy. -Continue current chemotherapy regimen every two weeks. -Order a repeat scan for end of December.   Chemotherapy-induced Peripheral Neuropathy Reports numbness and tingling in feet, more bothersome than in hands. No impact on sleep or walking. Pain reported when removing socks. -Advise to keep feet warm, consider using warm blankets or heated socks. -Encourage regular walking to improve blood circulation and nerve recovery.  General Health Maintenance -Continue Dexamethasone after chemotherapy as it has been helpful. -Check blood counts regularly to ensure stability for ongoing treatment. -Schedule next chemotherapy sessions, noting  that treatment weeks do not coincide with Thanksgiving or Christmas. -  Follow-up at next scheduled appointment.     Plan -Lab reviewed, mild anemia stable, CMP still pending, if adequate, will proceed to treatment today -Follow-up in 2 weeks, I will order restaging CT scan on next visit    SUMMARY OF ONCOLOGIC HISTORY: Oncology History Overview Note   Cancer Staging  Gastric cancer [facility]) Staging form: Stomach, AJCC 8th Edition - Clinical stage from 10/18/2021: Stage III (cT3, cN3a, cM0) - Signed by Malachy Mood, MD on 11/10/2021     Gastric cancer (HCC)  10/18/2021 Procedure   EGD performed under the care of Dr. Bosie Clos  Findings:  -A large, ulcerated, partially circumferential (involving two thirds of the lumen circumference) mass with no bleeding and stigmata of recent bleeding was found in the gastric antrum, at the pylorus and in the prepyloric region of the stomach. -Segmental severe inflammation characterized by congestion (edema) and erythema was found in the gastric antrum.   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 Negative for dysplasia.  Negative for helicobacter pylori  Mismatch Repair (MMR) Protein Imunohistochemistry (IHC): IHC Expression Result: MLH1: Preserved nuclear expression. MSH2: Preserved nuclear expression. MSH6: Preserved nuclear expression. PMS2: Preserved nuclear expression. Interpretation: NORMAL   10/31/2021 Initial Diagnosis   Gastric cancer (HCC)   11/01/2021 Tumor Marker   CEA = 7,846.96 (^)   11/06/2021 Procedure    Upper EUS, Dr. Dulce Sellar  Impression: - Normal esophagus. - A small amount of food (residue) in the stomach. - Congested, friable (with contact bleeding) and ulcerated mucosa in the prepyloric region of the stomach. - Normal examined duodenum. - There was no evidence of significant pathology in the left lobe of the liver. - Many abnormal lymph nodes (over 10) were visualized in the gastrohepatic ligament (level 18), celiac region (level 20), perigastric region, peripancreatic region and aortocaval region. - Wall thickening was seen in the prepyloric region of the stomach. The thickening appeared to be primarily within the deep mucosa (Layer 2) but extended through the muscularis propria. - Overall constellation of findings consistent with at least T3 N3 Mx (at least stage III) gastric adenocarcinoma.   11/08/2021 Imaging   EXAM: CT CHEST, ABDOMEN, AND PELVIS WITH CONTRAST  IMPRESSION: 1. Similar irregular wall thickening of the gastric antrum, consistent with patient's known primary gastric neoplasm. 2. Increased size of the upper abdominal lymph nodes, concerning for worsening nodal disease involvement. 3. Stable left adrenal nodule common nonspecific possibly a metastatic lesion or adenoma. 4. No evidence of metastatic disease in the chest. 5. Left-sided colonic diverticulosis without findings of acute diverticulitis. 6. Enlarged prostate gland. 7.  Aortic Atherosclerosis (ICD10-I70.0).   11/22/2021 - 04/26/2022 Chemotherapy   Patient is on Treatment Plan : GASTROESOPHAGEAL FLOT q14d X 4 cycles     11/27/2021 Imaging    IMPRESSION: 1. The mass within the gastric antrum is mildly decreased in size when compared with 11/08/2021. There is persistent FDG uptake associated with this mass compatible with residual tumor. 2. Persistent FDG avid lymph nodes within the gastrohepatic ligament, portacaval region, and mesentery. These are mildly decreased in size when compared with  11/08/2021. 3. Tracer avid left supraclavicular lymph node is also  mildly decreased in size when compared with 11/08/2021. 4. Indeterminate left adrenal gland nodule with mild FDG uptake. This may represent a lipid poor adenoma. Metastatic disease not exclude. 5. Diffuse increased uptake throughout the bone marrow is favored to represent treatment related change. 6.  Aortic Atherosclerosis (ICD10-I70.0).     02/07/2022 Imaging    IMPRESSION: 1. No substantial change in hypermetabolism associated with the distal gastric lesion. Although difficult to discern on noncontrast CT imaging, the soft tissue component appears to be decreased since the prior PET-CT. 2. Interval resolution of the hypermetabolic left supraclavicular lymph node. 3. Interval marked decrease of the hypermetabolic gastrohepatic ligament lymphadenopathy. Similar decrease in size and hypermetabolism associated with index nodes identified previously in the 4. porta hepatis and hypogastric region. 5. Stable left adrenal nodule with slight hypermetabolism. Nonspecific finding could represent lipid poor adenoma or metastatic deposit. 6. No new suspicious hypermetabolic disease on today's study. 7.  Aortic Atherosclerosis (ICD10-I70.0).   07/25/2022 - 08/15/2022 Chemotherapy   Patient is on Treatment Plan : GASTROESOPHAGEAL Ramucirumab D1, 15 + Paclitaxel D1,8,15 q28d     08/29/2022 -  Chemotherapy   Patient is on Treatment Plan : COLORECTAL FOLFIRI q14d        Discussed the use of AI scribe software for clinical note transcription with the patient, who gave verbal consent to proceed.  History of Present Illness   The patient, a 52 year old with a history of gastric cancer currently on chemotherapy, presents for a follow-up visit. He reports tolerating the chemotherapy well, with less difficulty than previous treatments. He experiences neuropathy, more pronounced in the feet than the hands, described as numbness and  tingling. This does not impact his sleep or walking, and he has not experienced any falls. He is able to continue working without issue. However, he reports significant discomfort when removing socks, particularly in the evening. Despite these side effects, he continues to be active and work daily, except for treatment days.         All other systems were reviewed with the patient and are negative.  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    SURGICAL HISTORY: Past Surgical History:  Procedure Laterality Date   BIOPSY  04/12/2021   Procedure: BIOPSY;  Surgeon: Charlott Rakes, MD;  Location: [facility];  Service: Gastroenterology;;   ESOPHAGOGASTRODUODENOSCOPY N/A 04/12/2021   Procedure: ESOPHAGOGASTRODUODENOSCOPY (EGD);  Surgeon: Charlott Rakes, MD;  Location: [facility];  Service: Gastroenterology;  Laterality: N/A;   ESOPHAGOGASTRODUODENOSCOPY N/A 11/06/2021   Procedure: ESOPHAGOGASTRODUODENOSCOPY (EGD);  Surgeon: Willis Modena, MD;  Location: [facility];  Service: Gastroenterology;  Laterality: N/A;   GASTRECTOMY N/A 05/20/2022   Procedure: OPEN PARTIAL GASTRECTOMY WITH GASTROJEJUNAL ANASTOMOSIS;  Surgeon: Sheliah Hatch, De Blanch, MD;  Location: [facility];  Service: General;  Laterality: N/A;   HERNIA REPAIR Left    inguinal   IR IMAGING GUIDED PORT INSERTION  11/15/2021   LAPAROSCOPY N/A 05/20/2022   Procedure: LAPAROSCOPY DIAGNOSTIC;  Surgeon: Rodman Pickle, MD;  Location: [facility];  Service: General;  Laterality: N/A;   UPPER ESOPHAGEAL ENDOSCOPIC ULTRASOUND (EUS) Bilateral 11/06/2021   Procedure: UPPER ESOPHAGEAL ENDOSCOPIC ULTRASOUND (EUS);  Surgeon: Willis Modena, MD;  Location: [facility];  Service: Gastroenterology;  Laterality: Bilateral;    I have reviewed the social history and family history with the patient and they are unchanged from previous note.  ALLERGIES:  has No Known Allergies.  MEDICATIONS:  Current Outpatient Medications  Medication Sig Dispense Refill   acetaminophen (TYLENOL) 500 MG tablet Take 500 mg by mouth 2 (two) times daily.     dexamethasone (DECADRON) 4 MG tablet Take 1-2 tablets (4-8 mg total) by mouth daily. Start the day after chemotherapy for 1-2 days.  Take with food. 20 tablet 1   gabapentin (NEURONTIN) 100 MG capsule Take 1-2 capsules (100-200 mg total) by mouth 4 (four) times daily as needed. OK to increase to 2 or 3 capsules in a few weeks if tolerates well 240 capsule 0   ondansetron (ZOFRAN-ODT) 4 MG disintegrating tablet Dissolve 1 tablet (4 mg total) by mouth every 6 (six) hours as needed for nausea or vomiting. 20 tablet 0   pantoprazole (PROTONIX) 40 MG tablet Take 1 tablet (40 mg) by mouth 2 times daily. 60 tablet 6   No current facility-administered medications for this visit.    PHYSICAL EXAMINATION: ECOG PERFORMANCE STATUS: 1 - Symptomatic but completely ambulatory  Vitals:   10/23/22 0904  BP: 121/76  Pulse: 90  Resp: 18  Temp: 98.4 F (36.9 C)  SpO2: 100%   Wt Readings from Last 3 Encounters:  10/23/22 176 lb 9.6 oz (80.1 kg)  10/09/22 174 lb 8 oz (79.2 kg)  09/25/22 171 lb 6.4 oz (77.7 kg)    GENERAL:alert, no distress and comfortable SKIN: skin color, texture, turgor are normal, no rashes or significant lesions EYES: normal, Conjunctiva are pink and non-injected, sclera clear NECK: supple, thyroid normal size, non-tender, without nodularity LYMPH:  no palpable lymphadenopathy in the cervical, axillary  LUNGS: clear to auscultation and percussion with normal breathing effort HEART: regular rate & rhythm and no murmurs and no lower extremity edema ABDOMEN:abdomen soft, non-tender and normal bowel  sounds Musculoskeletal:no cyanosis of digits and no clubbing  NEURO: alert & oriented x 3 with fluent speech, no focal motor/sensory deficits    LABORATORY DATA:  I have reviewed the data as listed    Latest Ref Rng & Units 10/23/2022    8:33 AM 10/09/2022    9:10 AM 09/25/2022    8:09 AM  CBC  WBC 4.0 - 10.5 K/uL 7.2  5.7  5.8   Hemoglobin 13.0 - 17.0 g/dL 40.9  81.1  91.4   Hematocrit 39.0 - 52.0 % 33.1  33.0  33.3   Platelets 150 - 400 K/uL 210  218  212         Latest Ref Rng & Units 10/09/2022    9:10 AM 09/25/2022    8:09 AM 09/11/2022    8:13 AM  CMP  Glucose 70 - 99 mg/dL 782  956  83   BUN 6 - 20 mg/dL 9  12  9    Creatinine 0.61 - 1.24 mg/dL 2.13  0.86  5.78   Sodium 135 - 145 mmol/L 141  140  141   Potassium 3.5 - 5.1 mmol/L 3.9  3.7  4.0   Chloride 98 - 111 mmol/L 108  106  108   CO2 22 - 32 mmol/L 29  28  28    Calcium 8.9 - 10.3 mg/dL 9.5  9.6  9.2   Total Protein 6.5 - 8.1 g/dL 6.6  7.1  6.8   Total Bilirubin 0.3 - 1.2 mg/dL 0.5  0.4  0.4   Alkaline Phos 38 - 126 U/L 71  78  92   AST 15 - 41 U/L 15  13  14    ALT 0 - 44 U/L 7  7  7        RADIOGRAPHIC STUDIES: I have personally reviewed the radiological images as listed and agreed with the findings in the report. No results found.    Orders Placed This Encounter  Procedures   CBC with Differential (Cancer Center Only)    Standing Status:   Future    Standing Expiration Date:   12/25/2023   CMP (Cancer Center only)    Standing Status:   Future    Standing Expiration Date:   12/25/2023   CBC with Differential (Cancer Center Only)    Standing Status:   Future    Standing Expiration Date:   01/08/2024   CMP (Cancer Center only)    Standing Status:   Future    Standing Expiration Date:   01/08/2024   All questions were answered. The patient knows to call the clinic with any problems, questions or concerns. No barriers to learning was detected. The total time spent in the appointment was 25 minutes.      Malachy Mood, MD 10/23/2022

## 2022-10-25 ENCOUNTER — Inpatient Hospital Stay: Payer: BC Managed Care – PPO

## 2022-10-25 VITALS — BP 133/75 | HR 78 | Temp 97.7°F | Resp 18

## 2022-10-25 DIAGNOSIS — Z5111 Encounter for antineoplastic chemotherapy: Secondary | ICD-10-CM | POA: Diagnosis not present

## 2022-10-25 DIAGNOSIS — C163 Malignant neoplasm of pyloric antrum: Secondary | ICD-10-CM

## 2022-10-25 MED ORDER — SODIUM CHLORIDE 0.9% FLUSH
10.0000 mL | INTRAVENOUS | Status: DC | PRN
  Administered 2022-10-25: 10 mL

## 2022-10-25 MED ORDER — HEPARIN SOD (PORK) LOCK FLUSH 100 UNIT/ML IV SOLN
500.0000 [IU] | Freq: Once | INTRAVENOUS | Status: AC | PRN
  Administered 2022-10-25: 500 [IU]

## 2022-11-02 NOTE — Progress Notes (Unsigned)
Patient Care Team: Pcp, No as PCP - General Malachy Mood, MD as Consulting Physician (Hematology and Oncology)   CHIEF COMPLAINT: Follow up gastric cancer   Oncology History Overview Note   Cancer Staging  Gastric cancer [facility]) Staging form: Stomach, AJCC 8th Edition - Clinical stage from 10/18/2021: Stage III (cT3, cN3a, cM0) - Signed by Malachy Mood, MD on 11/10/2021     Gastric cancer (HCC)  10/18/2021 Procedure   EGD performed under the care of Dr. Bosie Clos  Findings:  -A large, ulcerated, partially circumferential (involving two thirds of the lumen circumference) mass with no bleeding and stigmata of recent bleeding was found in the gastric antrum, at the pylorus and in the prepyloric region of the stomach. -Segmental severe inflammation characterized by congestion (edema) and erythema was found in the gastric antrum.   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 Negative for dysplasia.  Negative for helicobacter pylori  Mismatch Repair (MMR) Protein Imunohistochemistry (IHC): IHC Expression Result: MLH1: Preserved nuclear expression. MSH2: Preserved nuclear expression. MSH6: Preserved nuclear expression. PMS2: Preserved nuclear expression. Interpretation: NORMAL   10/31/2021 Initial Diagnosis   Gastric cancer (HCC)   11/01/2021 Tumor Marker   CEA = 0,347.42 (^)   11/06/2021 Procedure   Upper EUS, Dr. Dulce Sellar  Impression: - Normal esophagus. - A small amount of food (residue) in the stomach. - Congested, friable (with contact bleeding) and  ulcerated mucosa in the prepyloric region of the stomach. - Normal examined duodenum. - There was no evidence of significant pathology in the left lobe of the liver. - Many abnormal lymph nodes (over 10) were visualized in the gastrohepatic ligament (level 18), celiac region (level 20), perigastric region, peripancreatic region and aortocaval region. - Wall thickening was seen in the prepyloric region of the stomach. The thickening appeared to be primarily within the deep mucosa (Layer 2) but extended through the muscularis propria. - Overall constellation of findings consistent with at least T3 N3 Mx (at least stage III) gastric adenocarcinoma.   11/08/2021 Imaging   EXAM: CT CHEST, ABDOMEN, AND PELVIS WITH CONTRAST  IMPRESSION: 1. Similar irregular wall thickening of the gastric antrum, consistent with patient's known primary gastric neoplasm. 2. Increased size of the upper abdominal lymph nodes, concerning for worsening nodal disease involvement. 3. Stable left adrenal nodule common nonspecific possibly a metastatic lesion or adenoma. 4. No evidence of metastatic disease in the chest. 5. Left-sided colonic diverticulosis without findings of acute diverticulitis. 6. Enlarged prostate gland. 7.  Aortic Atherosclerosis (ICD10-I70.0).   11/22/2021 - 04/26/2022 Chemotherapy   Patient is on Treatment Plan : GASTROESOPHAGEAL FLOT q14d X 4 cycles     11/27/2021 Imaging    IMPRESSION: 1. The mass within the gastric antrum is mildly decreased in size when compared with 11/08/2021. There is persistent FDG uptake associated with this mass compatible with residual tumor. 2. Persistent FDG avid lymph nodes within the gastrohepatic ligament, portacaval region, and mesentery. These are mildly decreased in size when compared with 11/08/2021. 3. Tracer avid left supraclavicular lymph node is also mildly decreased in size when compared with 11/08/2021. 4. Indeterminate left adrenal gland nodule  with  mild FDG uptake. This may represent a lipid poor adenoma. Metastatic disease not exclude. 5. Diffuse increased uptake throughout the bone marrow is favored to represent treatment related change. 6.  Aortic Atherosclerosis (ICD10-I70.0).     02/07/2022 Imaging    IMPRESSION: 1. No substantial change in hypermetabolism associated with the distal gastric lesion. Although difficult to discern on noncontrast CT imaging, the soft tissue component appears to be decreased since the prior PET-CT. 2. Interval resolution of the hypermetabolic left supraclavicular lymph node. 3. Interval marked decrease of the hypermetabolic gastrohepatic ligament lymphadenopathy. Similar decrease in size and hypermetabolism associated with index nodes identified previously in the 4. porta hepatis and hypogastric region. 5. Stable left adrenal nodule with slight hypermetabolism. Nonspecific finding could represent lipid poor adenoma or metastatic deposit. 6. No new suspicious hypermetabolic disease on today's study. 7.  Aortic Atherosclerosis (ICD10-I70.0).   07/25/2022 - 08/15/2022 Chemotherapy   Patient is on Treatment Plan : GASTROESOPHAGEAL Ramucirumab D1, 15 + Paclitaxel D1,8,15 q28d     08/29/2022 -  Chemotherapy   Patient is on Treatment Plan : COLORECTAL FOLFIRI q14d        CURRENT THERAPY: FOLFIRI q2 weeks, starting 08/29/22  INTERVAL HISTORY Jonathan Maynard returns for follow up and treatment as scheduled.   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     Past Surgical History:  Procedure Laterality Date   BIOPSY  04/12/2021   Procedure: BIOPSY;  Surgeon: Charlott Rakes, MD;  Location: [facility];  Service: Gastroenterology;;    ESOPHAGOGASTRODUODENOSCOPY N/A 04/12/2021   Procedure: ESOPHAGOGASTRODUODENOSCOPY (EGD);  Surgeon: Charlott Rakes, MD;  Location: [facility];  Service: Gastroenterology;  Laterality: N/A;   ESOPHAGOGASTRODUODENOSCOPY N/A 11/06/2021   Procedure: ESOPHAGOGASTRODUODENOSCOPY (EGD);  Surgeon: Willis Modena, MD;  Location: [facility];  Service: Gastroenterology;  Laterality: N/A;   GASTRECTOMY N/A 05/20/2022   Procedure: OPEN PARTIAL GASTRECTOMY WITH GASTROJEJUNAL ANASTOMOSIS;  Surgeon: Sheliah Hatch, De Blanch, MD;  Location: [facility];  Service: General;  Laterality: N/A;   HERNIA REPAIR Left    inguinal   IR IMAGING GUIDED PORT INSERTION  11/15/2021   LAPAROSCOPY N/A 05/20/2022   Procedure: LAPAROSCOPY DIAGNOSTIC;  Surgeon: Rodman Pickle, MD;  Location: [facility];  Service: General;  Laterality: N/A;   UPPER ESOPHAGEAL ENDOSCOPIC ULTRASOUND (EUS) Bilateral 11/06/2021   Procedure: UPPER ESOPHAGEAL ENDOSCOPIC ULTRASOUND (EUS);  Surgeon: Willis Modena, MD;  Location: [facility];  Service: Gastroenterology;  Laterality: Bilateral;     Outpatient Encounter Medications as of 11/06/2022  Medication Sig   acetaminophen (TYLENOL) 500 MG tablet Take 500 mg by mouth 2 (two) times daily.   dexamethasone (DECADRON) 4 MG tablet Take 1-2 tablets (4-8 mg total) by mouth daily. Start the day after chemotherapy for 1-2 days.  Take with food.   gabapentin (NEURONTIN) 100 MG capsule Take 1-2 capsules (100-200 mg total) by mouth 4 (four) times daily as needed. OK to increase to 2 or 3 capsules in a few weeks if tolerates well   ondansetron (ZOFRAN-ODT) 4 MG disintegrating tablet Dissolve 1 tablet (4 mg total) by mouth every 6 (six) hours as needed for nausea or vomiting.   pantoprazole (PROTONIX) 40 MG tablet Take 1 tablet (40 mg) by  mouth 2 times daily.   No facility-administered encounter medications on file as of 11/06/2022.     There were no vitals filed for this visit. There is no height or weight  on file to calculate BMI.   PHYSICAL EXAM GENERAL:alert, no distress and comfortable SKIN: no rash  EYES: sclera clear NECK: without mass LYMPH:  no palpable cervical or supraclavicular lymphadenopathy  LUNGS: clear with normal breathing effort HEART: regular rate & rhythm, no lower extremity edema ABDOMEN: abdomen soft, non-tender and normal bowel sounds NEURO: alert & oriented x 3 with fluent speech, no focal motor/sensory deficits Breast exam:  PAC without erythema    CBC    Component Value Date/Time   WBC 7.2 10/23/2022 0833   WBC 11.0 (H) 05/24/2022 0504   RBC 3.79 (L) 10/23/2022 0833   HGB 10.7 (L) 10/23/2022 0833   HCT 33.1 (L) 10/23/2022 0833   PLT 210 10/23/2022 0833   MCV 87.3 10/23/2022 0833   MCH 28.2 10/23/2022 0833   MCHC 32.3 10/23/2022 0833   RDW 18.1 (H) 10/23/2022 0833   LYMPHSABS 2.1 10/23/2022 0833   MONOABS 0.8 10/23/2022 0833   EOSABS 0.2 10/23/2022 0833   BASOSABS 0.0 10/23/2022 0833     CMP     Component Value Date/Time   NA 141 10/23/2022 0833   K 3.9 10/23/2022 0833   CL 108 10/23/2022 0833   CO2 28 10/23/2022 0833   GLUCOSE 100 (H) 10/23/2022 0833   BUN 9 10/23/2022 0833   CREATININE 0.81 10/23/2022 0833   CALCIUM 9.2 10/23/2022 0833   PROT 6.5 10/23/2022 0833   ALBUMIN 3.8 10/23/2022 0833   AST 12 (L) 10/23/2022 0833   ALT 8 10/23/2022 0833   ALKPHOS 64 10/23/2022 0833   BILITOT 0.5 10/23/2022 0833   GFRNONAA >60 10/23/2022 7253     ASSESSMENT & PLAN:Jonathan Maynard is a 52 y.o. male with    Gastric cancer, stage III cT3N3Mx, with hypermetabolic left supraclavicular node, and indeterminate adrenal nodule, MMR normal ypT3ypN3a -diagnosed 10/18/21 by EGD for 6 month f/u of gastric ulcers, showed mucosal intramucosal adenocarcinoma. MMR normal. -baseline CEA 11/01/21 significantly elevated at 1,989.58. -EUS on 11/06/21 by Dr. Dulce Sellar, staged as T3 N3 (at least stage III) -staging CT CAP 11/08/21 showed: stable gastric antrum  wall thickening; increased size of upper abdominal lymph nodes; stable left adrenal nodule, 1.9 cm. -PET scan showed FDG avid lymph nodes within the gastrohepatic ligament, portacaval region, and mesentery. Tracer avid left supraclavicular lymph node and indeterminate left adrenal gland nodule with mild FDG uptake. -he underwent Fenton node biopsy on 1/17 which was negative for malignant cells, but no lymphoid tissue seen on biopsy  -repeated PET on 02/07/22 after 3 months neoadjuvant chemo showed resolved left supraclavicular lymph node, and significant improvement in regional lymph nodes and the primary tumor, stable and indeterminate left adrenal nodule.  -CT adrenal protocol showed the left adrenal nodule is likely benign adenoma, no biopsy is needed. -Completed neoadjvant chemo FLOT s/p 12 cycles 11/22/21 - 04/27/2022 -restaging PET on 05/16/22 showed significantly hypermetabolic activity at the primary gastric cancer in pylorus, and a small hypermetabolic mesenteric lymph node, no other evidence of metastasis. surgeon Dr. Sheliah Hatch aware of PET findings -S/p distal gastrectomy 05/20/22, path showed ypT3N3a with clear margins, 7/18 + LNs, and lymphovascular invasion identified. There was some treatment effect in a few lymph nodes nut not in the primary tumor -Due to rising CEA he underwent PET scan 07/07/22 which showed hypermetabolic new  adenopathy in right hilum and mesentery, diffuse liver metastasis, consistent with metastatic recurrence. He understands cancer is not curable at this stage, unfortunately  -He began palliative treatment with taxol/Ramucirumab (due to baseline neuropathy) but worsened quickly, stopped after 3 doses and he changed to FOLFIRI 08/29/22 -CEA initially increased on treatment, CT 10/06/22 showed stable to slightly improved disease; he continues FOLFIRI and CEA has improved recently         PLAN:  No orders of the defined types were placed in this encounter.     All questions  were answered. The patient knows to call the clinic with any problems, questions or concerns. No barriers to learning were detected. I spent *** counseling the patient face to face. The total time spent in the appointment was *** and more than 50% was on counseling, review of test results, and coordination of care.   Santiago Glad, NP-C @DATE @

## 2022-11-06 ENCOUNTER — Inpatient Hospital Stay: Payer: BC Managed Care – PPO

## 2022-11-06 ENCOUNTER — Inpatient Hospital Stay (HOSPITAL_BASED_OUTPATIENT_CLINIC_OR_DEPARTMENT_OTHER): Payer: BC Managed Care – PPO | Admitting: Nurse Practitioner

## 2022-11-06 ENCOUNTER — Encounter: Payer: Self-pay | Admitting: Nurse Practitioner

## 2022-11-06 VITALS — BP 111/67 | HR 79 | Temp 98.3°F | Resp 20 | Wt 179.0 lb

## 2022-11-06 DIAGNOSIS — C163 Malignant neoplasm of pyloric antrum: Secondary | ICD-10-CM

## 2022-11-06 DIAGNOSIS — G620 Drug-induced polyneuropathy: Secondary | ICD-10-CM

## 2022-11-06 DIAGNOSIS — Z95828 Presence of other vascular implants and grafts: Secondary | ICD-10-CM

## 2022-11-06 DIAGNOSIS — Z5111 Encounter for antineoplastic chemotherapy: Secondary | ICD-10-CM | POA: Diagnosis not present

## 2022-11-06 DIAGNOSIS — T451X5A Adverse effect of antineoplastic and immunosuppressive drugs, initial encounter: Secondary | ICD-10-CM

## 2022-11-06 LAB — CMP (CANCER CENTER ONLY)
ALT: 7 U/L (ref 0–44)
AST: 13 U/L — ABNORMAL LOW (ref 15–41)
Albumin: 3.8 g/dL (ref 3.5–5.0)
Alkaline Phosphatase: 74 U/L (ref 38–126)
Anion gap: 5 (ref 5–15)
BUN: 10 mg/dL (ref 6–20)
CO2: 29 mmol/L (ref 22–32)
Calcium: 9 mg/dL (ref 8.9–10.3)
Chloride: 106 mmol/L (ref 98–111)
Creatinine: 0.93 mg/dL (ref 0.61–1.24)
GFR, Estimated: >60 mL/min (ref >60)
Glucose, Bld: 100 mg/dL — ABNORMAL HIGH (ref 70–99)
Potassium: 4.1 mmol/L (ref 3.5–5.1)
Sodium: 140 mmol/L (ref 135–145)
Total Bilirubin: 0.5 mg/dL (ref 0.3–1.2)
Total Protein: 6.3 g/dL — ABNORMAL LOW (ref 6.5–8.1)

## 2022-11-06 LAB — CBC WITH DIFFERENTIAL (CANCER CENTER ONLY)
Abs Immature Granulocytes: 0.02 10*3/uL (ref 0.00–0.07)
Basophils Absolute: 0 10*3/uL (ref 0.0–0.1)
Basophils Relative: 1 %
Eosinophils Absolute: 0.1 10*3/uL (ref 0.0–0.5)
Eosinophils Relative: 2 %
HCT: 32.5 % — ABNORMAL LOW (ref 39.0–52.0)
Hemoglobin: 10.4 g/dL — ABNORMAL LOW (ref 13.0–17.0)
Immature Granulocytes: 0 %
Lymphocytes Relative: 31 %
Lymphs Abs: 1.9 10*3/uL (ref 0.7–4.0)
MCH: 28 pg (ref 26.0–34.0)
MCHC: 32 g/dL (ref 30.0–36.0)
MCV: 87.6 fL (ref 80.0–100.0)
Monocytes Absolute: 0.9 10*3/uL (ref 0.1–1.0)
Monocytes Relative: 15 %
Neutro Abs: 3.1 10*3/uL (ref 1.7–7.7)
Neutrophils Relative %: 51 %
Platelet Count: 189 10*3/uL (ref 150–400)
RBC: 3.71 MIL/uL — ABNORMAL LOW (ref 4.22–5.81)
RDW: 18.1 % — ABNORMAL HIGH (ref 11.5–15.5)
WBC Count: 6.1 10*3/uL (ref 4.0–10.5)
nRBC: 0 % (ref 0.0–0.2)

## 2022-11-06 MED ORDER — DEXAMETHASONE SODIUM PHOSPHATE 10 MG/ML IJ SOLN
10.0000 mg | Freq: Once | INTRAMUSCULAR | Status: AC
  Administered 2022-11-06: 10 mg via INTRAVENOUS
  Filled 2022-11-06: qty 1

## 2022-11-06 MED ORDER — ACETAMINOPHEN 500 MG PO TABS: 500.0000 mg | ORAL_TABLET | Freq: Two times a day (BID) | ORAL | 3 refills | Status: AC

## 2022-11-06 MED ORDER — SODIUM CHLORIDE 0.9 % IV SOLN
400.0000 mg/m2 | Freq: Once | INTRAVENOUS | Status: AC
  Administered 2022-11-06: 760 mg via INTRAVENOUS
  Filled 2022-11-06: qty 25

## 2022-11-06 MED ORDER — GABAPENTIN 100 MG PO CAPS: 100.0000 mg | ORAL_CAPSULE | Freq: Four times a day (QID) | ORAL | 2 refills | Status: DC | PRN

## 2022-11-06 MED ORDER — PALONOSETRON HCL INJECTION 0.25 MG/5ML
0.2500 mg | Freq: Once | INTRAVENOUS | Status: AC
  Administered 2022-11-06: 0.25 mg via INTRAVENOUS
  Filled 2022-11-06: qty 5

## 2022-11-06 MED ORDER — SODIUM CHLORIDE 0.9 % IV SOLN
2400.0000 mg/m2 | INTRAVENOUS | Status: DC
  Administered 2022-11-06: 5000 mg via INTRAVENOUS
  Filled 2022-11-06: qty 100

## 2022-11-06 MED ORDER — ATROPINE SULFATE 1 MG/ML IV SOLN
0.5000 mg | Freq: Once | INTRAVENOUS | Status: DC | PRN
  Filled 2022-11-06: qty 1

## 2022-11-06 MED ORDER — SODIUM CHLORIDE 0.9 % IV SOLN: Freq: Once | INTRAVENOUS | Status: AC

## 2022-11-06 MED ORDER — SODIUM CHLORIDE 0.9 % IV SOLN
180.0000 mg/m2 | Freq: Once | INTRAVENOUS | Status: AC
  Administered 2022-11-06: 340 mg via INTRAVENOUS
  Filled 2022-11-06: qty 15

## 2022-11-06 MED ORDER — SODIUM CHLORIDE 0.9% FLUSH
10.0000 mL | Freq: Once | INTRAVENOUS | Status: AC
  Administered 2022-11-06: 10 mL

## 2022-11-06 NOTE — Patient Instructions (Addendum)
[place] [facility]   Discharge Instructions: Thank you for choosing [facility] to provide your oncology and hematology care.   If you have a lab appointment with the Cancer Center, please go directly to the Cancer Center and check in at the registration area.   Wear comfortable clothing and clothing appropriate for easy access to any Portacath or PICC line.   We strive to give you quality time with your provider. You may need to reschedule your appointment if you arrive late (15 or more minutes).  Arriving late affects you and other patients whose appointments are after yours.  Also, if you miss three or more appointments without notifying the office, you may be dismissed from the clinic at the provider's discretion.      For prescription refill requests, have your pharmacy contact our office and allow 72 hours for refills to be completed.    Today you received the following chemotherapy and/or immunotherapy agents: Irinotecan, Leucovorin, and Fluorouracil (Adrucil)      To help prevent nausea and vomiting after your treatment, we encourage you to take your nausea medication as directed.  BELOW ARE SYMPTOMS THAT SHOULD BE REPORTED IMMEDIATELY: *FEVER GREATER THAN 100.4 F (38 C) OR HIGHER *CHILLS OR SWEATING *NAUSEA AND VOMITING THAT IS NOT CONTROLLED WITH YOUR NAUSEA MEDICATION *UNUSUAL SHORTNESS OF BREATH *UNUSUAL BRUISING OR BLEEDING *URINARY PROBLEMS (pain or burning when urinating, or frequent urination) *BOWEL PROBLEMS (unusual diarrhea, constipation, pain near the anus) TENDERNESS IN MOUTH AND THROAT WITH OR WITHOUT PRESENCE OF ULCERS (sore throat, sores in mouth, or a toothache) UNUSUAL RASH, SWELLING OR PAIN  UNUSUAL VAGINAL DISCHARGE OR ITCHING   Items with * indicate a potential emergency and should be followed up as soon as possible or go to the Emergency Department if any problems should occur.  Please show the CHEMOTHERAPY  ALERT CARD or IMMUNOTHERAPY ALERT CARD at check-in to the Emergency Department and triage nurse.  Should you have questions after your visit or need to cancel or reschedule your appointment, please contact [place] [facility]  Dept: [phone]  and follow the prompts.  Office hours are 8:00 a.m. to 4:30 p.m. Monday - Friday. Please note that voicemails left after 4:00 p.m. may not be returned until the following business day.  We are closed weekends and major holidays. You have access to a nurse at all times for urgent questions. Please call the main number to the clinic Dept: [phone] and follow the prompts.   For any non-urgent questions, you may also contact your provider using MyChart. We now offer e-Visits for anyone 6 and older to request care online for non-urgent symptoms. For details visit [email or web address].   Also download the MyChart app! Go to the app store, search "MyChart", open the app, select [facility], and log in with your MyChart username and password.   The chemotherapy medication bag should finish at 46 hours, 96 hours, or 7 days. For example, if your pump is scheduled for 46 hours and it was put on at 4:00 p.m., it should finish at 2:00 p.m. the day it is scheduled to come off regardless of your appointment time.     Estimated time to finish at 11/2 @ 11:00 AM.    If the display on your pump reads "Low Volume" and it is beeping, take the batteries out of the pump and come to the cancer center for it to be taken off.  If the pump alarms go off prior to the pump reading "Low Volume" then call [phone] and someone can assist you.  If the plunger comes out and the chemotherapy medication is leaking out, please use your home chemo spill kit to clean up the spill. Do NOT use paper towels or other household products.  If you have problems or questions regarding your pump, please call either [phone] (24 hours a day) or the  cancer center Monday-Friday 8:00 a.m.- 4:30 p.m. at the clinic number and we will assist you. If you are unable to get assistance, then go to the nearest Emergency Department and ask the staff to contact the IV team for assistance.

## 2022-11-08 ENCOUNTER — Inpatient Hospital Stay: Payer: BC Managed Care – PPO | Attending: Physician Assistant

## 2022-11-08 DIAGNOSIS — Z452 Encounter for adjustment and management of vascular access device: Secondary | ICD-10-CM | POA: Diagnosis not present

## 2022-11-08 DIAGNOSIS — T451X5D Adverse effect of antineoplastic and immunosuppressive drugs, subsequent encounter: Secondary | ICD-10-CM | POA: Diagnosis not present

## 2022-11-08 DIAGNOSIS — G620 Drug-induced polyneuropathy: Secondary | ICD-10-CM | POA: Insufficient documentation

## 2022-11-08 DIAGNOSIS — Z7952 Long term (current) use of systemic steroids: Secondary | ICD-10-CM | POA: Insufficient documentation

## 2022-11-08 DIAGNOSIS — E669 Obesity, unspecified: Secondary | ICD-10-CM | POA: Insufficient documentation

## 2022-11-08 DIAGNOSIS — C787 Secondary malignant neoplasm of liver and intrahepatic bile duct: Secondary | ICD-10-CM | POA: Diagnosis not present

## 2022-11-08 DIAGNOSIS — Z5111 Encounter for antineoplastic chemotherapy: Secondary | ICD-10-CM | POA: Diagnosis present

## 2022-11-08 DIAGNOSIS — C163 Malignant neoplasm of pyloric antrum: Secondary | ICD-10-CM | POA: Insufficient documentation

## 2022-11-08 DIAGNOSIS — K760 Fatty (change of) liver, not elsewhere classified: Secondary | ICD-10-CM | POA: Insufficient documentation

## 2022-11-08 DIAGNOSIS — E278 Other specified disorders of adrenal gland: Secondary | ICD-10-CM | POA: Diagnosis not present

## 2022-11-08 DIAGNOSIS — R970 Elevated carcinoembryonic antigen [CEA]: Secondary | ICD-10-CM | POA: Insufficient documentation

## 2022-11-08 DIAGNOSIS — R110 Nausea: Secondary | ICD-10-CM | POA: Insufficient documentation

## 2022-11-08 MED ORDER — HEPARIN SOD (PORK) LOCK FLUSH 100 UNIT/ML IV SOLN
500.0000 [IU] | Freq: Once | INTRAVENOUS | Status: AC | PRN
Start: 2022-11-08 — End: 2022-11-08
  Administered 2022-11-08: 500 [IU]

## 2022-11-08 MED ORDER — SODIUM CHLORIDE 0.9% FLUSH
10.0000 mL | INTRAVENOUS | Status: DC | PRN
  Administered 2022-11-08: 10 mL

## 2022-11-11 NOTE — Progress Notes (Signed)
Submitted prior authorization request for Gabapentin via CoverMyMeds to [employer] plan. Awaiting response.

## 2022-11-12 ENCOUNTER — Telehealth: Payer: Self-pay

## 2022-11-12 NOTE — Telephone Encounter (Signed)
Notified Patient of prior authorization approval for Gabapentin 100 mg Capsules. Medication is approved through 11/10/2023. No other needs or concerns voiced at this time.

## 2022-11-19 NOTE — Progress Notes (Unsigned)
Patient Care Team: Pcp, No as PCP - General Malachy Mood, MD as Consulting Physician (Hematology and Oncology)   CHIEF COMPLAINT: Follow up gastric cancer   Oncology History Overview Note   Cancer Staging  Gastric cancer [facility]) Staging form: Stomach, AJCC 8th Edition - Clinical stage from 10/18/2021: Stage III (cT3, cN3a, cM0) - Signed by Malachy Mood, MD on 11/10/2021     Gastric cancer (HCC)  10/18/2021 Procedure   EGD performed under the care of Dr. Bosie Clos  Findings:  -A large, ulcerated, partially circumferential (involving two thirds of the lumen circumference) mass with no bleeding and stigmata of recent bleeding was found in the gastric antrum, at the pylorus and in the prepyloric region of the stomach. -Segmental severe inflammation characterized by congestion (edema) and erythema was found in the gastric antrum.   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 Negative for dysplasia.  Negative for helicobacter pylori  Mismatch Repair (MMR) Protein Imunohistochemistry (IHC): IHC Expression Result: MLH1: Preserved nuclear expression. MSH2: Preserved nuclear expression. MSH6: Preserved nuclear expression. PMS2: Preserved nuclear expression. Interpretation: NORMAL   10/31/2021 Initial Diagnosis   Gastric cancer (HCC)   11/01/2021 Tumor Marker   CEA = 1,610.96 (^)   11/06/2021 Procedure   Upper EUS, Dr. Dulce Sellar  Impression: - Normal esophagus. - A small amount of food (residue) in the stomach. - Congested, friable (with contact bleeding) and  ulcerated mucosa in the prepyloric region of the stomach. - Normal examined duodenum. - There was no evidence of significant pathology in the left lobe of the liver. - Many abnormal lymph nodes (over 10) were visualized in the gastrohepatic ligament (level 18), celiac region (level 20), perigastric region, peripancreatic region and aortocaval region. - Wall thickening was seen in the prepyloric region of the stomach. The thickening appeared to be primarily within the deep mucosa (Layer 2) but extended through the muscularis propria. - Overall constellation of findings consistent with at least T3 N3 Mx (at least stage III) gastric adenocarcinoma.   11/08/2021 Imaging   EXAM: CT CHEST, ABDOMEN, AND PELVIS WITH CONTRAST  IMPRESSION: 1. Similar irregular wall thickening of the gastric antrum, consistent with patient's known primary gastric neoplasm. 2. Increased size of the upper abdominal lymph nodes, concerning for worsening nodal disease involvement. 3. Stable left adrenal nodule common nonspecific possibly a metastatic lesion or adenoma. 4. No evidence of metastatic disease in the chest. 5. Left-sided colonic diverticulosis without findings of acute diverticulitis. 6. Enlarged prostate gland. 7.  Aortic Atherosclerosis (ICD10-I70.0).   11/22/2021 - 04/26/2022 Chemotherapy   Patient is on Treatment Plan : GASTROESOPHAGEAL FLOT q14d X 4 cycles     11/27/2021 Imaging    IMPRESSION: 1. The mass within the gastric antrum is mildly decreased in size when compared with 11/08/2021. There is persistent FDG uptake associated with this mass compatible with residual tumor. 2. Persistent FDG avid lymph nodes within the gastrohepatic ligament, portacaval region, and mesentery. These are mildly decreased in size when compared with 11/08/2021. 3. Tracer avid left supraclavicular lymph node is also mildly decreased in size when compared with 11/08/2021. 4. Indeterminate left adrenal gland nodule  with  mild FDG uptake. This may represent a lipid poor adenoma. Metastatic disease not exclude. 5. Diffuse increased uptake throughout the bone marrow is favored to represent treatment related change. 6.  Aortic Atherosclerosis (ICD10-I70.0).     02/07/2022 Imaging    IMPRESSION: 1. No substantial change in hypermetabolism associated with the distal gastric lesion. Although difficult to discern on noncontrast CT imaging, the soft tissue component appears to be decreased since the prior PET-CT. 2. Interval resolution of the hypermetabolic left supraclavicular lymph node. 3. Interval marked decrease of the hypermetabolic gastrohepatic ligament lymphadenopathy. Similar decrease in size and hypermetabolism associated with index nodes identified previously in the 4. porta hepatis and hypogastric region. 5. Stable left adrenal nodule with slight hypermetabolism. Nonspecific finding could represent lipid poor adenoma or metastatic deposit. 6. No new suspicious hypermetabolic disease on today's study. 7.  Aortic Atherosclerosis (ICD10-I70.0).   07/25/2022 - 08/15/2022 Chemotherapy   Patient is on Treatment Plan : GASTROESOPHAGEAL Ramucirumab D1, 15 + Paclitaxel D1,8,15 q28d     08/29/2022 -  Chemotherapy   Patient is on Treatment Plan : COLORECTAL FOLFIRI q14d        CURRENT THERAPY: FOLFIRI q2 weeks, starting 08/29/22  INTERVAL HISTORY Jonathan Maynard returns for follow up as scheduled, last seen by me 11/06/22 with C6 FOLFIRI.   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     Past Surgical History:  Procedure Laterality Date   BIOPSY  04/12/2021   Procedure: BIOPSY;  Surgeon: Charlott Rakes, MD;  Location: [facility];   Service: Gastroenterology;;   ESOPHAGOGASTRODUODENOSCOPY N/A 04/12/2021   Procedure: ESOPHAGOGASTRODUODENOSCOPY (EGD);  Surgeon: Charlott Rakes, MD;  Location: [facility];  Service: Gastroenterology;  Laterality: N/A;   ESOPHAGOGASTRODUODENOSCOPY N/A 11/06/2021   Procedure: ESOPHAGOGASTRODUODENOSCOPY (EGD);  Surgeon: Willis Modena, MD;  Location: [facility];  Service: Gastroenterology;  Laterality: N/A;   GASTRECTOMY N/A 05/20/2022   Procedure: OPEN PARTIAL GASTRECTOMY WITH GASTROJEJUNAL ANASTOMOSIS;  Surgeon: Sheliah Hatch, De Blanch, MD;  Location: [facility];  Service: General;  Laterality: N/A;   HERNIA REPAIR Left    inguinal   IR IMAGING GUIDED PORT INSERTION  11/15/2021   LAPAROSCOPY N/A 05/20/2022   Procedure: LAPAROSCOPY DIAGNOSTIC;  Surgeon: Rodman Pickle, MD;  Location: [facility];  Service: General;  Laterality: N/A;   UPPER ESOPHAGEAL ENDOSCOPIC ULTRASOUND (EUS) Bilateral 11/06/2021   Procedure: UPPER ESOPHAGEAL ENDOSCOPIC ULTRASOUND (EUS);  Surgeon: Willis Modena, MD;  Location: [facility];  Service: Gastroenterology;  Laterality: Bilateral;     Outpatient Encounter Medications as of 11/20/2022  Medication Sig   acetaminophen (TYLENOL) 500 MG tablet Take 1 tablet (500 mg total) by mouth 2 (two) times daily.   dexamethasone (DECADRON) 4 MG tablet Take 1-2 tablets (4-8 mg total) by mouth daily. Start the day after chemotherapy for 1-2 days.  Take with food.   gabapentin (NEURONTIN) 100 MG capsule Take 1-2 capsules (100-200 mg total) by mouth 4 (four) times daily as needed. OK to increase to 2 or 3 capsules in a few weeks if tolerates well   ondansetron (ZOFRAN-ODT) 4 MG disintegrating tablet Dissolve 1 tablet (4 mg total) by mouth every 6 (six) hours as needed for nausea or vomiting.   pantoprazole (PROTONIX)  40 MG tablet Take 1 tablet (40 mg) by mouth 2 times daily.   No facility-administered encounter medications on file as of 11/20/2022.     There were no vitals  filed for this visit. There is no height or weight on file to calculate BMI.   PHYSICAL EXAM GENERAL:alert, no distress and comfortable SKIN: no rash  EYES: sclera clear NECK: without mass LYMPH:  no palpable cervical or supraclavicular lymphadenopathy  LUNGS: clear with normal breathing effort HEART: regular rate & rhythm, no lower extremity edema ABDOMEN: abdomen soft, non-tender and normal bowel sounds NEURO: alert & oriented x 3 with fluent speech, no focal motor/sensory deficits Breast exam:  PAC without erythema    CBC    Component Value Date/Time   WBC 6.1 11/06/2022 0838   WBC 11.0 (H) 05/24/2022 0504   RBC 3.71 (L) 11/06/2022 0838   HGB 10.4 (L) 11/06/2022 0838   HCT 32.5 (L) 11/06/2022 0838   PLT 189 11/06/2022 0838   MCV 87.6 11/06/2022 0838   MCH 28.0 11/06/2022 0838   MCHC 32.0 11/06/2022 0838   RDW 18.1 (H) 11/06/2022 0838   LYMPHSABS 1.9 11/06/2022 0838   MONOABS 0.9 11/06/2022 0838   EOSABS 0.1 11/06/2022 0838   BASOSABS 0.0 11/06/2022 0838     CMP     Component Value Date/Time   NA 140 11/06/2022 0838   K 4.1 11/06/2022 0838   CL 106 11/06/2022 0838   CO2 29 11/06/2022 0838   GLUCOSE 100 (H) 11/06/2022 0838   BUN 10 11/06/2022 0838   CREATININE 0.93 11/06/2022 0838   CALCIUM 9.0 11/06/2022 0838   PROT 6.3 (L) 11/06/2022 0838   ALBUMIN 3.8 11/06/2022 0838   AST 13 (L) 11/06/2022 0838   ALT 7 11/06/2022 0838   ALKPHOS 74 11/06/2022 0838   BILITOT 0.5 11/06/2022 0838   GFRNONAA >60 11/06/2022 0347     ASSESSMENT & PLAN:Jonathan Maynard is a 52 y.o. male with    Gastric cancer, stage III cT3N3Mx, with hypermetabolic left supraclavicular node, and indeterminate adrenal nodule, MMR normal ypT3ypN3a -diagnosed 10/18/21 by EGD for 6 month f/u of gastric ulcers, showed mucosal intramucosal adenocarcinoma. MMR normal. -baseline CEA 11/01/21 significantly elevated at 1,989.58. -EUS on 11/06/21 by Dr. Dulce Sellar, staged as T3 N3 (at least stage  III) -staging CT CAP 11/08/21 showed: stable gastric antrum wall thickening; increased size of upper abdominal lymph nodes; stable left adrenal nodule, 1.9 cm. -PET scan showed FDG avid lymph nodes within the gastrohepatic ligament, portacaval region, and mesentery. Tracer avid left supraclavicular lymph node and indeterminate left adrenal gland nodule with mild FDG uptake. -he underwent  node biopsy on 1/17 which was negative for malignant cells, but no lymphoid tissue seen on biopsy  -repeated PET on 02/07/22 after 3 months neoadjuvant chemo showed resolved left supraclavicular lymph node, and significant improvement in regional lymph nodes and the primary tumor, stable and indeterminate left adrenal nodule.  -CT adrenal protocol showed the left adrenal nodule is likely benign adenoma, no biopsy is needed. -Completed neoadjvant chemo FLOT s/p 12 cycles 11/22/21 - 04/27/2022 -restaging PET on 05/16/22 showed significantly hypermetabolic activity at the primary gastric cancer in pylorus, and a small hypermetabolic mesenteric lymph node, no other evidence of metastasis. surgeon Dr. Sheliah Hatch aware of PET findings -S/p distal gastrectomy 05/20/22, path showed ypT3N3a with clear margins, 7/18 + LNs, and lymphovascular invasion identified. There was some treatment effect in a few lymph nodes nut not in the primary tumor -Due to rising  CEA he underwent PET scan 07/07/22 which showed hypermetabolic new adenopathy in right hilum and mesentery, diffuse liver metastasis, consistent with metastatic recurrence. He understands cancer is not curable at this stage, unfortunately  -He began palliative treatment with taxol/Ramucirumab (due to baseline neuropathy) but worsened quickly, stopped after 3 doses and he changed to FOLFIRI 08/29/22 -CEA initially increased on treatment, CT 10/06/22 showed stable to slightly improved disease; he continues FOLFIRI and CEA has improved recently. S/p 6 cycles       PLAN:  No orders of  the defined types were placed in this encounter.     All questions were answered. The patient knows to call the clinic with any problems, questions or concerns. No barriers to learning were detected. I spent *** counseling the patient face to face. The total time spent in the appointment was *** and more than 50% was on counseling, review of test results, and coordination of care.   Santiago Glad, NP-C @DATE @

## 2022-11-20 ENCOUNTER — Inpatient Hospital Stay: Payer: BC Managed Care – PPO

## 2022-11-20 ENCOUNTER — Inpatient Hospital Stay: Payer: BC Managed Care – PPO | Admitting: Nurse Practitioner

## 2022-11-20 ENCOUNTER — Encounter: Payer: Self-pay | Admitting: Nurse Practitioner

## 2022-11-20 VITALS — BP 125/77 | HR 87 | Temp 98.0°F | Resp 16 | Wt 177.2 lb

## 2022-11-20 DIAGNOSIS — C163 Malignant neoplasm of pyloric antrum: Secondary | ICD-10-CM | POA: Diagnosis not present

## 2022-11-20 DIAGNOSIS — G620 Drug-induced polyneuropathy: Secondary | ICD-10-CM | POA: Diagnosis not present

## 2022-11-20 DIAGNOSIS — Z95828 Presence of other vascular implants and grafts: Secondary | ICD-10-CM

## 2022-11-20 DIAGNOSIS — T451X5A Adverse effect of antineoplastic and immunosuppressive drugs, initial encounter: Secondary | ICD-10-CM

## 2022-11-20 DIAGNOSIS — Z5111 Encounter for antineoplastic chemotherapy: Secondary | ICD-10-CM | POA: Diagnosis not present

## 2022-11-20 LAB — CMP (CANCER CENTER ONLY)
ALT: 8 U/L (ref 0–44)
AST: 14 U/L — ABNORMAL LOW (ref 15–41)
Albumin: 3.8 g/dL (ref 3.5–5.0)
Alkaline Phosphatase: 73 U/L (ref 38–126)
Anion gap: 6 (ref 5–15)
BUN: 12 mg/dL (ref 6–20)
CO2: 28 mmol/L (ref 22–32)
Calcium: 9.3 mg/dL (ref 8.9–10.3)
Chloride: 107 mmol/L (ref 98–111)
Creatinine: 0.96 mg/dL (ref 0.61–1.24)
GFR, Estimated: >60 mL/min (ref >60)
Glucose, Bld: 119 mg/dL — ABNORMAL HIGH (ref 70–99)
Potassium: 4 mmol/L (ref 3.5–5.1)
Sodium: 141 mmol/L (ref 135–145)
Total Bilirubin: 0.5 mg/dL (ref <1.2)
Total Protein: 6.5 g/dL (ref 6.5–8.1)

## 2022-11-20 LAB — CBC WITH DIFFERENTIAL (CANCER CENTER ONLY)
Abs Immature Granulocytes: 0.03 10*3/uL (ref 0.00–0.07)
Basophils Absolute: 0 10*3/uL (ref 0.0–0.1)
Basophils Relative: 1 %
Eosinophils Absolute: 0.1 10*3/uL (ref 0.0–0.5)
Eosinophils Relative: 2 %
HCT: 33.4 % — ABNORMAL LOW (ref 39.0–52.0)
Hemoglobin: 10.6 g/dL — ABNORMAL LOW (ref 13.0–17.0)
Immature Granulocytes: 1 %
Lymphocytes Relative: 34 %
Lymphs Abs: 1.7 10*3/uL (ref 0.7–4.0)
MCH: 28 pg (ref 26.0–34.0)
MCHC: 31.7 g/dL (ref 30.0–36.0)
MCV: 88.1 fL (ref 80.0–100.0)
Monocytes Absolute: 0.5 10*3/uL (ref 0.1–1.0)
Monocytes Relative: 9 %
Neutro Abs: 2.7 10*3/uL (ref 1.7–7.7)
Neutrophils Relative %: 53 %
Platelet Count: 208 10*3/uL (ref 150–400)
RBC: 3.79 MIL/uL — ABNORMAL LOW (ref 4.22–5.81)
RDW: 18.7 % — ABNORMAL HIGH (ref 11.5–15.5)
WBC Count: 5.1 10*3/uL (ref 4.0–10.5)
nRBC: 0 % (ref 0.0–0.2)

## 2022-11-20 LAB — CEA (IN HOUSE-CHCC): CEA (CHCC-In House): 300.16 ng/mL — ABNORMAL HIGH (ref 0.00–5.00)

## 2022-11-20 MED ORDER — FLUOROURACIL CHEMO INJECTION 5 GM/100ML
2400.0000 mg/m2 | INTRAVENOUS | Status: DC
  Administered 2022-11-20: 5000 mg via INTRAVENOUS
  Filled 2022-11-20: qty 100

## 2022-11-20 MED ORDER — SODIUM CHLORIDE 0.9% FLUSH
10.0000 mL | INTRAVENOUS | Status: DC | PRN
Start: 2022-11-20 — End: 2022-11-20

## 2022-11-20 MED ORDER — SODIUM CHLORIDE 0.9 % IV SOLN: Freq: Once | INTRAVENOUS | Status: AC

## 2022-11-20 MED ORDER — SODIUM CHLORIDE 0.9% FLUSH
10.0000 mL | Freq: Once | INTRAVENOUS | Status: AC
  Administered 2022-11-20: 10 mL

## 2022-11-20 MED ORDER — HEPARIN SOD (PORK) LOCK FLUSH 100 UNIT/ML IV SOLN: 500.0000 [IU] | Freq: Once | INTRAVENOUS | Status: DC | PRN

## 2022-11-20 MED ORDER — DEXAMETHASONE 4 MG PO TABS
4.0000 mg | ORAL_TABLET | Freq: Every day | ORAL | 1 refills | Status: DC
Start: 2022-11-20 — End: 2023-02-05

## 2022-11-20 MED ORDER — SODIUM CHLORIDE 0.9 % IV SOLN
400.0000 mg/m2 | Freq: Once | INTRAVENOUS | Status: AC
  Administered 2022-11-20: 760 mg via INTRAVENOUS
  Filled 2022-11-20: qty 25

## 2022-11-20 MED ORDER — ATROPINE SULFATE 1 MG/ML IV SOLN
0.5000 mg | Freq: Once | INTRAVENOUS | Status: DC | PRN
  Filled 2022-11-20: qty 1

## 2022-11-20 MED ORDER — SODIUM CHLORIDE 0.9 % IV SOLN
180.0000 mg/m2 | Freq: Once | INTRAVENOUS | Status: AC
  Administered 2022-11-20: 340 mg via INTRAVENOUS
  Filled 2022-11-20: qty 15

## 2022-11-20 MED ORDER — PROCHLORPERAZINE MALEATE 10 MG PO TABS: 10.0000 mg | ORAL_TABLET | Freq: Four times a day (QID) | ORAL | 2 refills | Status: AC | PRN

## 2022-11-20 MED ORDER — PALONOSETRON HCL INJECTION 0.25 MG/5ML
0.2500 mg | Freq: Once | INTRAVENOUS | Status: AC
  Administered 2022-11-20: 0.25 mg via INTRAVENOUS
  Filled 2022-11-20: qty 5

## 2022-11-20 MED ORDER — DEXAMETHASONE SODIUM PHOSPHATE 10 MG/ML IJ SOLN
10.0000 mg | Freq: Once | INTRAMUSCULAR | Status: AC
  Administered 2022-11-20: 10 mg via INTRAVENOUS
  Filled 2022-11-20: qty 1

## 2022-11-20 MED ORDER — GABAPENTIN 100 MG PO CAPS
100.0000 mg | ORAL_CAPSULE | Freq: Four times a day (QID) | ORAL | 2 refills | Status: DC | PRN
Start: 2022-11-20 — End: 2023-03-19

## 2022-11-20 MED ORDER — ONDANSETRON 4 MG PO TBDP: 4.0000 mg | ORAL_TABLET | Freq: Four times a day (QID) | ORAL | 1 refills | Status: AC | PRN

## 2022-11-20 MED ORDER — SODIUM CHLORIDE 0.9 % IV SOLN
150.0000 mg | Freq: Once | INTRAVENOUS | Status: AC
  Administered 2022-11-20: 150 mg via INTRAVENOUS
  Filled 2022-11-20: qty 150

## 2022-11-20 NOTE — Patient Instructions (Addendum)
[facility] - A DEPT OF [facility]  Discharge Instructions: Thank you for choosing [facility] to provide your oncology and hematology care.   If you have a lab appointment with the Cancer Center, please go directly to the Cancer Center and check in at the registration area.   Wear comfortable clothing and clothing appropriate for easy access to any Portacath or PICC line.   We strive to give you quality time with your provider. You may need to reschedule your appointment if you arrive late (15 or more minutes).  Arriving late affects you and other patients whose appointments are after yours.  Also, if you miss three or more appointments without notifying the office, you may be dismissed from the clinic at the provider's discretion.      For prescription refill requests, have your pharmacy contact our office and allow 72 hours for refills to be completed.    Today you received the following chemotherapy and/or immunotherapy agents: Irinotecan, Leucovorin, and Fluorouracil.      To help prevent nausea and vomiting after your treatment, we encourage you to take your nausea medication as directed.  BELOW ARE SYMPTOMS THAT SHOULD BE REPORTED IMMEDIATELY: *FEVER GREATER THAN 100.4 F (38 C) OR HIGHER *CHILLS OR SWEATING *NAUSEA AND VOMITING THAT IS NOT CONTROLLED WITH YOUR NAUSEA MEDICATION *UNUSUAL SHORTNESS OF BREATH *UNUSUAL BRUISING OR BLEEDING *URINARY PROBLEMS (pain or burning when urinating, or frequent urination) *BOWEL PROBLEMS (unusual diarrhea, constipation, pain near the anus) TENDERNESS IN MOUTH AND THROAT WITH OR WITHOUT PRESENCE OF ULCERS (sore throat, sores in mouth, or a toothache) UNUSUAL RASH, SWELLING OR PAIN  UNUSUAL VAGINAL DISCHARGE OR ITCHING   Items with * indicate a potential emergency and should be followed up as soon as possible or go to the Emergency Department if any problems should occur.  Please show the  CHEMOTHERAPY ALERT CARD or IMMUNOTHERAPY ALERT CARD at check-in to the Emergency Department and triage nurse.  Should you have questions after your visit or need to cancel or reschedule your appointment, please contact [facility] - A DEPT OF [facility]  Dept: [phone]  and follow the prompts.  Office hours are 8:00 a.m. to 4:30 p.m. Monday - Friday. Please note that voicemails left after 4:00 p.m. may not be returned until the following business day.  We are closed weekends and major holidays. You have access to a nurse at all times for urgent questions. Please call the main number to the clinic Dept: [phone] and follow the prompts.   For any non-urgent questions, you may also contact your provider using MyChart. We now offer e-Visits for anyone 12 and older to request care online for non-urgent symptoms. For details visit [email or web address].   Also download the MyChart app! Go to the app store, search "MyChart", open the app, select [facility], and log in with your MyChart username and password. The chemotherapy medication bag should finish at 46 hours, 96 hours, or 7 days. For example, if your pump is scheduled for 46 hours and it was put on at 4:00 p.m., it should finish at 2:00 p.m. the day it is scheduled to come off regardless of your appointment time.     Estimated time to finish at: 11:00 AM   If the display on your pump reads "Low Volume" and it is beeping, take the batteries out of the pump and come to the cancer center for it to be  taken off.   If the pump alarms go off prior to the pump reading "Low Volume" then call [phone] and someone can assist you.  If the plunger comes out and the chemotherapy medication is leaking out, please use your home chemo spill kit to clean up the spill. Do NOT use paper towels or other household products.  If you have problems or questions regarding your pump, please call either [phone] (24  hours a day) or the cancer center Monday-Friday 8:00 a.m.- 4:30 p.m. at the clinic number and we will assist you. If you are unable to get assistance, then go to the nearest Emergency Department and ask the staff to contact the IV team for assistance.

## 2022-11-22 ENCOUNTER — Inpatient Hospital Stay: Payer: BC Managed Care – PPO

## 2022-11-22 VITALS — BP 114/65 | HR 92 | Temp 97.8°F | Resp 18

## 2022-11-22 DIAGNOSIS — Z5111 Encounter for antineoplastic chemotherapy: Secondary | ICD-10-CM | POA: Diagnosis not present

## 2022-11-22 DIAGNOSIS — C163 Malignant neoplasm of pyloric antrum: Secondary | ICD-10-CM

## 2022-11-22 MED ORDER — SODIUM CHLORIDE 0.9% FLUSH
10.0000 mL | INTRAVENOUS | Status: DC | PRN
Start: 2022-11-22 — End: 2022-11-22
  Administered 2022-11-22: 10 mL

## 2022-11-22 MED ORDER — HEPARIN SOD (PORK) LOCK FLUSH 100 UNIT/ML IV SOLN
500.0000 [IU] | Freq: Once | INTRAVENOUS | Status: AC | PRN
  Administered 2022-11-22: 500 [IU]

## 2022-12-05 ENCOUNTER — Other Ambulatory Visit: Payer: Self-pay | Admitting: Hematology

## 2022-12-10 MED FILL — Fosaprepitant Dimeglumine For IV Infusion 150 MG (Base Eq): INTRAVENOUS | Qty: 5 | Status: AC

## 2022-12-11 ENCOUNTER — Inpatient Hospital Stay: Payer: BC Managed Care – PPO

## 2022-12-11 ENCOUNTER — Inpatient Hospital Stay: Payer: BC Managed Care – PPO | Attending: Physician Assistant

## 2022-12-11 ENCOUNTER — Encounter: Payer: Self-pay | Admitting: Hematology

## 2022-12-11 ENCOUNTER — Inpatient Hospital Stay (HOSPITAL_BASED_OUTPATIENT_CLINIC_OR_DEPARTMENT_OTHER): Payer: BC Managed Care – PPO | Admitting: Hematology

## 2022-12-11 VITALS — BP 128/78 | HR 95 | Temp 98.1°F | Resp 18 | Wt 181.5 lb

## 2022-12-11 DIAGNOSIS — E669 Obesity, unspecified: Secondary | ICD-10-CM | POA: Insufficient documentation

## 2022-12-11 DIAGNOSIS — Z95828 Presence of other vascular implants and grafts: Secondary | ICD-10-CM

## 2022-12-11 DIAGNOSIS — E278 Other specified disorders of adrenal gland: Secondary | ICD-10-CM | POA: Insufficient documentation

## 2022-12-11 DIAGNOSIS — C787 Secondary malignant neoplasm of liver and intrahepatic bile duct: Secondary | ICD-10-CM | POA: Insufficient documentation

## 2022-12-11 DIAGNOSIS — C163 Malignant neoplasm of pyloric antrum: Secondary | ICD-10-CM

## 2022-12-11 DIAGNOSIS — Z5111 Encounter for antineoplastic chemotherapy: Secondary | ICD-10-CM | POA: Insufficient documentation

## 2022-12-11 DIAGNOSIS — G620 Drug-induced polyneuropathy: Secondary | ICD-10-CM | POA: Diagnosis not present

## 2022-12-11 DIAGNOSIS — Z7952 Long term (current) use of systemic steroids: Secondary | ICD-10-CM | POA: Diagnosis not present

## 2022-12-11 DIAGNOSIS — Z452 Encounter for adjustment and management of vascular access device: Secondary | ICD-10-CM | POA: Diagnosis not present

## 2022-12-11 DIAGNOSIS — T451X5D Adverse effect of antineoplastic and immunosuppressive drugs, subsequent encounter: Secondary | ICD-10-CM | POA: Insufficient documentation

## 2022-12-11 LAB — CBC WITH DIFFERENTIAL (CANCER CENTER ONLY)
Abs Immature Granulocytes: 0.06 10*3/uL (ref 0.00–0.07)
Basophils Absolute: 0.1 10*3/uL (ref 0.0–0.1)
Basophils Relative: 1 %
Eosinophils Absolute: 0.2 10*3/uL (ref 0.0–0.5)
Eosinophils Relative: 2 %
HCT: 33.4 % — ABNORMAL LOW (ref 39.0–52.0)
Hemoglobin: 10.8 g/dL — ABNORMAL LOW (ref 13.0–17.0)
Immature Granulocytes: 1 %
Lymphocytes Relative: 38 %
Lymphs Abs: 2.6 10*3/uL (ref 0.7–4.0)
MCH: 28.9 pg (ref 26.0–34.0)
MCHC: 32.3 g/dL (ref 30.0–36.0)
MCV: 89.3 fL (ref 80.0–100.0)
Monocytes Absolute: 1 10*3/uL (ref 0.1–1.0)
Monocytes Relative: 15 %
Neutro Abs: 2.9 10*3/uL (ref 1.7–7.7)
Neutrophils Relative %: 43 %
Platelet Count: 263 10*3/uL (ref 150–400)
RBC: 3.74 MIL/uL — ABNORMAL LOW (ref 4.22–5.81)
RDW: 20 % — ABNORMAL HIGH (ref 11.5–15.5)
WBC Count: 6.8 10*3/uL (ref 4.0–10.5)
nRBC: 0 % (ref 0.0–0.2)

## 2022-12-11 LAB — CMP (CANCER CENTER ONLY)
ALT: 10 U/L (ref 0–44)
AST: 15 U/L (ref 15–41)
Albumin: 3.8 g/dL (ref 3.5–5.0)
Alkaline Phosphatase: 95 U/L (ref 38–126)
Anion gap: 4 — ABNORMAL LOW (ref 5–15)
BUN: 8 mg/dL (ref 6–20)
CO2: 30 mmol/L (ref 22–32)
Calcium: 9.1 mg/dL (ref 8.9–10.3)
Chloride: 106 mmol/L (ref 98–111)
Creatinine: 0.93 mg/dL (ref 0.61–1.24)
GFR, Estimated: >60 mL/min (ref >60)
Glucose, Bld: 104 mg/dL — ABNORMAL HIGH (ref 70–99)
Potassium: 3.8 mmol/L (ref 3.5–5.1)
Sodium: 140 mmol/L (ref 135–145)
Total Bilirubin: 0.4 mg/dL (ref <1.2)
Total Protein: 6.4 g/dL — ABNORMAL LOW (ref 6.5–8.1)

## 2022-12-11 MED ORDER — SODIUM CHLORIDE 0.9 % IV SOLN: Freq: Once | INTRAVENOUS | Status: AC

## 2022-12-11 MED ORDER — FOSAPREPITANT DIMEGLUMINE INJECTION 150 MG
150.0000 mg | Freq: Once | INTRAVENOUS | Status: AC
  Administered 2022-12-11: 150 mg via INTRAVENOUS
  Filled 2022-12-11: qty 150

## 2022-12-11 MED ORDER — PALONOSETRON HCL INJECTION 0.25 MG/5ML
0.2500 mg | Freq: Once | INTRAVENOUS | Status: AC
  Administered 2022-12-11: 0.25 mg via INTRAVENOUS
  Filled 2022-12-11: qty 5

## 2022-12-11 MED ORDER — SODIUM CHLORIDE 0.9 % IV SOLN
400.0000 mg/m2 | Freq: Once | INTRAVENOUS | Status: AC
  Administered 2022-12-11: 760 mg via INTRAVENOUS
  Filled 2022-12-11: qty 38

## 2022-12-11 MED ORDER — DEXAMETHASONE SODIUM PHOSPHATE 10 MG/ML IJ SOLN
10.0000 mg | Freq: Once | INTRAMUSCULAR | Status: AC
Start: 2022-12-11 — End: 2022-12-11
  Administered 2022-12-11: 10 mg via INTRAVENOUS
  Filled 2022-12-11: qty 1

## 2022-12-11 MED ORDER — SODIUM CHLORIDE 0.9% FLUSH
10.0000 mL | Freq: Once | INTRAVENOUS | Status: AC
  Administered 2022-12-11: 10 mL

## 2022-12-11 MED ORDER — FLUOROURACIL CHEMO INJECTION 5 GM/100ML
2400.0000 mg/m2 | INTRAVENOUS | Status: DC
  Administered 2022-12-11: 5000 mg via INTRAVENOUS
  Filled 2022-12-11: qty 100

## 2022-12-11 MED ORDER — SODIUM CHLORIDE 0.9 % IV SOLN
180.0000 mg/m2 | Freq: Once | INTRAVENOUS | Status: AC
  Administered 2022-12-11: 340 mg via INTRAVENOUS
  Filled 2022-12-11: qty 12

## 2022-12-11 NOTE — Assessment & Plan Note (Signed)
cT3N3Mx, with hypermetabolic left supraclavicular node, and indeterminate adrenal nodule, MMR normal ypT3ypN3a, liver and nodes recurrence in 07/2022 -diagnosed 10/18/21 by EGD for 6 month f/u of gastric ulcers, showed mucosal intramucosal adenocarcinoma. MMR normal. -baseline CEA 11/01/21 significantly elevated at 1,989.58. -EUS on 11/06/21 by Dr. Dulce Sellar, staged as T3 N3 (at least stage III) -staging CT CAP 11/08/21 showed: stable gastric antrum wall thickening; increased size of upper abdominal lymph nodes; stable left adrenal nodule, 1.9 cm. -PET scan showed FDG avid lymph nodes within the gastrohepatic ligament, portacaval region, and mesentery. Tracer avid left supraclavicular lymph node and indeterminate left adrenal gland nodule with mild FDG uptake. -he underwent [place] node biopsy on 1/17 which was negative for malignant cells, but no lymphoid tissue seen on biopsy  -repeated PET on 02/07/22 after 3 months neoadjuvant chemo showed resolved left supraclavicular lymph node, and significant improvement in regional lymph nodes and the primary tumor, stable and indeterminate left adrenal nodule.  -CT adrenal protocol showed the left adrenal nodule is likely benign adenoma, no biopsy is needed. -Completed neoadjvant chemo FLOT s/p 12 cycles 11/22/21 - 04/27/2022 -restaging PET on 05/16/22 showed significantly hypermetabolic activity at the primary gastric cancer in pylorus, and a small hypermetabolic mesenteric lymph node, no other evidence of metastasis. surgeon Dr. Sheliah Hatch aware of PET findings -S/p distal gastrectomy 05/20/22, path showed ypT3N3a with clear margins, 7/18 + LNs, and lymphovascular invasion identified. There was some treatment effect in a few lymph nodes nut not in the primary tumor -due to rising tumor marker, he underwent PET scan on July 07, 2022, which unfortunately showed hypermetabolic new adenopathy in right hilar and mesentery, diffuse liver metastasis, consistent with metastatic  recurrence.  I personally reviewed the PET scan images with patient -Unfortunately his disease is not curable at this stage. -The goal of chemotherapy is palliative, to prolong his life -I discussed his next generation sequencing foundation one result, which showed EGFR amplification, no other targetable mutations.  His tumor was previously tested for HER2 which was negative (0-1+).  PD-L1 CPS score was 2%, no significant benefit of immunotherapy.  -He started paclitaxel and ramucirumab on 07/25/2022  He developed worsening neuropathy quickly, and I had to switch his treatment to FOLFIRI on 08/29/2022, every 2 weeks, he is overall tolerating well.

## 2022-12-11 NOTE — Progress Notes (Signed)
[facility]   [phone] [phone]   Clinic Follow up Note   Patient Care Team: Pcp, No as PCP - Serita Grit, MD as Consulting Physician (Hematology and Oncology)  Date of Service:  12/11/2022  CHIEF COMPLAINT: f/u of gastric cancer  CURRENT THERAPY:  FOLFIRI every 2 weeks  Oncology History   Gastric cancer (HCC) cT3N3Mx, with hypermetabolic left supraclavicular node, and indeterminate adrenal nodule, MMR normal ypT3ypN3a, liver and nodes recurrence in 07/2022 -diagnosed 10/18/21 by EGD for 6 month f/u of gastric ulcers, showed mucosal intramucosal adenocarcinoma. MMR normal. -baseline CEA 11/01/21 significantly elevated at 1,989.58. -EUS on 11/06/21 by Dr. Dulce Sellar, staged as T3 N3 (at least stage III) -staging CT CAP 11/08/21 showed: stable gastric antrum wall thickening; increased size of upper abdominal lymph nodes; stable left adrenal nodule, 1.9 cm. -PET scan showed FDG avid lymph nodes within the gastrohepatic ligament, portacaval region, and mesentery. Tracer avid left supraclavicular lymph node and indeterminate left adrenal gland nodule with mild FDG uptake. -he underwent [place] node biopsy on 1/17 which was negative for malignant cells, but no lymphoid tissue seen on biopsy  -repeated PET on 02/07/22 after 3 months neoadjuvant chemo showed resolved left supraclavicular lymph node, and significant improvement in regional lymph nodes and the primary tumor, stable and indeterminate left adrenal nodule.  -CT adrenal protocol showed the left adrenal nodule is likely benign adenoma, no biopsy is needed. -Completed neoadjvant chemo FLOT s/p 12 cycles 11/22/21 - 04/27/2022 -restaging PET on 05/16/22 showed significantly hypermetabolic activity at the primary gastric cancer in pylorus, and a small hypermetabolic mesenteric lymph node, no other evidence of metastasis. surgeon Dr. Sheliah Hatch aware of PET findings -S/p distal gastrectomy 05/20/22, path showed  ypT3N3a with clear margins, 7/18 + LNs, and lymphovascular invasion identified. There was some treatment effect in a few lymph nodes nut not in the primary tumor -due to rising tumor marker, he underwent PET scan on July 07, 2022, which unfortunately showed hypermetabolic new adenopathy in right hilar and mesentery, diffuse liver metastasis, consistent with metastatic recurrence.  I personally reviewed the PET scan images with patient -Unfortunately his disease is not curable at this stage. -The goal of chemotherapy is palliative, to prolong his life -I discussed his next generation sequencing foundation one result, which showed EGFR amplification, no other targetable mutations.  His tumor was previously tested for HER2 which was negative (0-1+).  PD-L1 CPS score was 2%, no significant benefit of immunotherapy.  -He started paclitaxel and ramucirumab on 07/25/2022  He developed worsening neuropathy quickly, and I had to switch his treatment to FOLFIRI on 08/29/2022, every 2 weeks, he is overall tolerating well.   Assessment and Plan    Metastatic Gastric Cancer 52 year old undergoing chemotherapy, completed cycle eight. Reports manageable neuropathy treated with gabapentin. Tumor marker decreased from 3000 to 300, indicating positive response. Discussed new biologic agent pending molecular testing of claudin 18.2; 40-50% of patients may express required protein. Explained long-term chemotherapy to control disease with potential resistance over time. Patient understands need for ongoing treatment and periodic scans. 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 Not using ice during  infusions by choice. - Continue gabapentin - Monitor neuropathy symptoms and adjust treatment as necessary  General Health Maintenance Working and has not considered disability benefits. Aware of social benefits and has access to a Child psychotherapist if needed. - Provide information on social benefits and disability options - Offer referral to social worker  Plan -Lab reviewed, adequate for treatment, will proceed cycle 8 FOLFIRI today same dose and continue every 2 weeks -I ordered [facility] to see if his tumor has Claudin 18.2 expression or other targetable mutation -Follow-up in 2 weeks      SUMMARY OF ONCOLOGIC HISTORY: Oncology History Overview Note   Cancer Staging  Gastric cancer [facility]) Staging form: Stomach, AJCC 8th Edition - Clinical stage from 10/18/2021: Stage III (cT3, cN3a, cM0) - Signed by Malachy Mood, MD on 11/10/2021     Gastric cancer (HCC)  10/18/2021 Procedure   EGD performed under the care of Dr. Bosie Clos  Findings:  -A large, ulcerated, partially circumferential (involving two thirds of the lumen circumference) mass with no bleeding and stigmata of recent bleeding was found in the gastric antrum, at the pylorus and in the prepyloric region of the stomach. -Segmental severe inflammation characterized by congestion (edema) and erythema was found in the gastric antrum.   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 Negative for dysplasia.  Negative for helicobacter pylori  Mismatch Repair (MMR) Protein Imunohistochemistry (IHC): IHC Expression  Result: MLH1: Preserved nuclear expression. MSH2: Preserved nuclear expression. MSH6: Preserved nuclear expression. PMS2: Preserved nuclear expression. Interpretation: NORMAL   10/31/2021 Initial Diagnosis   Gastric cancer (HCC)   11/01/2021 Tumor Marker   CEA = 3,016.01 (^)   11/06/2021 Procedure   Upper EUS, Dr. Dulce Sellar  Impression: - Normal esophagus. - A small amount of food (residue) in the stomach. - Congested, friable (with contact bleeding) and ulcerated mucosa in the prepyloric region of the stomach. - Normal examined duodenum. - There was no evidence of significant pathology in the left lobe of the liver. - Many abnormal lymph nodes (over 10) were visualized in the gastrohepatic ligament (level 18), celiac region (level 20), perigastric region, peripancreatic region and aortocaval region. - Wall thickening was seen in the prepyloric region of the stomach. The thickening appeared to be primarily within the deep mucosa (Layer 2) but extended through the muscularis propria. - Overall constellation of findings consistent with at least T3 N3 Mx (at least stage III) gastric adenocarcinoma.   11/08/2021 Imaging   EXAM: CT CHEST, ABDOMEN, AND PELVIS WITH CONTRAST  IMPRESSION: 1. Similar irregular wall thickening of the gastric antrum, consistent with patient's known primary gastric neoplasm. 2. Increased size of the upper abdominal lymph nodes, concerning for worsening nodal disease involvement. 3. Stable left adrenal nodule common nonspecific possibly a metastatic lesion or adenoma. 4. No evidence of metastatic disease in the chest. 5. Left-sided colonic diverticulosis without findings of acute diverticulitis. 6. Enlarged prostate gland. 7.  Aortic Atherosclerosis (ICD10-I70.0).   11/22/2021 - 04/26/2022 Chemotherapy   Patient is on Treatment Plan : GASTROESOPHAGEAL FLOT q14d X 4 cycles     11/27/2021 Imaging    IMPRESSION: 1. The mass within the gastric antrum is  mildly decreased in size when compared with 11/08/2021. There is persistent FDG  uptake associated with this mass compatible with residual tumor. 2. Persistent FDG avid lymph nodes within the gastrohepatic ligament, portacaval region, and mesentery. These are mildly decreased in size when compared with 11/08/2021. 3. Tracer avid left supraclavicular lymph node is also mildly decreased in size when compared with 11/08/2021. 4. Indeterminate left adrenal gland nodule with mild FDG uptake. This may represent a lipid poor adenoma. Metastatic disease not exclude. 5. Diffuse increased uptake throughout the bone marrow is favored to represent treatment related change. 6.  Aortic Atherosclerosis (ICD10-I70.0).     02/07/2022 Imaging    IMPRESSION: 1. No substantial change in hypermetabolism associated with the distal gastric lesion. Although difficult to discern on noncontrast CT imaging, the soft tissue component appears to be decreased since the prior PET-CT. 2. Interval resolution of the hypermetabolic left supraclavicular lymph node. 3. Interval marked decrease of the hypermetabolic gastrohepatic ligament lymphadenopathy. Similar decrease in size and hypermetabolism associated with index nodes identified previously in the 4. porta hepatis and hypogastric region. 5. Stable left adrenal nodule with slight hypermetabolism. Nonspecific finding could represent lipid poor adenoma or metastatic deposit. 6. No new suspicious hypermetabolic disease on today's study. 7.  Aortic Atherosclerosis (ICD10-I70.0).   07/25/2022 - 08/15/2022 Chemotherapy   Patient is on Treatment Plan : GASTROESOPHAGEAL Ramucirumab D1, 15 + Paclitaxel D1,8,15 q28d     08/29/2022 -  Chemotherapy   Patient is on Treatment Plan : COLORECTAL FOLFIRI q14d        Discussed the use of AI scribe software for clinical note transcription with the patient, who gave verbal consent to proceed.  History of Present Illness   The  patient, a 52 year old with metastatic gastric cancer, presents for a follow-up visit. He reports neuropathy, which is being managed with gabapentin. The patient's spouse confirms that the patient is not using ice during the infusion, as he does not like it. The patient reports no other problems from the treatment and feels that the every-two-weeks treatment schedule is manageable. The patient recently took a two-week break from treatment to visit family in [place] for Thanksgiving. He plans to stay home for Christmas. The patient is still working and has not yet considered disability benefits.         All other systems were reviewed with the patient and are negative.  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    SURGICAL HISTORY: Past Surgical History:  Procedure Laterality Date   BIOPSY  04/12/2021   Procedure: BIOPSY;  Surgeon: Charlott Rakes, MD;  Location: [facility];  Service: Gastroenterology;;   ESOPHAGOGASTRODUODENOSCOPY N/A 04/12/2021   Procedure: ESOPHAGOGASTRODUODENOSCOPY (EGD);  Surgeon: Charlott Rakes, MD;  Location: [facility];  Service: Gastroenterology;  Laterality: N/A;   ESOPHAGOGASTRODUODENOSCOPY N/A 11/06/2021   Procedure: ESOPHAGOGASTRODUODENOSCOPY (EGD);  Surgeon: Willis Modena, MD;  Location: [facility];  Service: Gastroenterology;  Laterality: N/A;   GASTRECTOMY N/A 05/20/2022   Procedure: OPEN PARTIAL GASTRECTOMY WITH GASTROJEJUNAL ANASTOMOSIS;  Surgeon: Sheliah Hatch, De Blanch, MD;  Location: [facility];  Service: General;  Laterality: N/A;   HERNIA REPAIR Left    inguinal   IR IMAGING GUIDED PORT INSERTION  11/15/2021   LAPAROSCOPY N/A 05/20/2022   Procedure: LAPAROSCOPY  DIAGNOSTIC;  Surgeon: Sheliah Hatch, De Blanch, MD;  Location: [facility];  Service: General;  Laterality: N/A;   UPPER ESOPHAGEAL ENDOSCOPIC ULTRASOUND (EUS) Bilateral 11/06/2021   Procedure: UPPER ESOPHAGEAL ENDOSCOPIC ULTRASOUND (EUS);  Surgeon: Willis Modena, MD;  Location: [facility];  Service: Gastroenterology;  Laterality: Bilateral;    I have reviewed the social history and family history with the patient and they are unchanged from previous note.  ALLERGIES:  has No Known Allergies.  MEDICATIONS:  Current Outpatient Medications  Medication Sig Dispense Refill   acetaminophen (TYLENOL) 500 MG tablet Take 1 tablet (500 mg total) by mouth 2 (two) times daily. 60 tablet 3   dexamethasone (DECADRON) 4 MG tablet Take 1-2 tablets (4-8 mg total) by mouth daily. Start the day after chemotherapy for 1-2 days.  Take with food. 20 tablet 1   gabapentin (NEURONTIN) 100 MG capsule Take 1-2 capsules (100-200 mg total) by mouth 4 (four) times daily as needed. OK to increase to 2 or 3 capsules in a few weeks if tolerates well 240 capsule 2   ondansetron (ZOFRAN-ODT) 4 MG disintegrating tablet Dissolve 1 tablet (4 mg total) by mouth every 6 (six) hours as needed for nausea or vomiting. 30 tablet 1   pantoprazole (PROTONIX) 40 MG tablet Take 1 tablet (40 mg) by mouth 2 times daily. 60 tablet 6   prochlorperazine (COMPAZINE) 10 MG tablet Take 1 tablet (10 mg total) by mouth every 6 (six) hours as needed for nausea or vomiting. 30 tablet 2   No current facility-administered medications for this visit.   Facility-Administered Medications Ordered in Other Visits  Medication Dose Route Frequency Provider Last Rate Last Admin   fluorouracil (ADRUCIL) 5,000 mg in sodium chloride 0.9 % 150 mL chemo infusion  2,400 mg/m2 (Treatment Plan Recorded) Intravenous 1 day or 1 dose Malachy Mood, MD   Infusion Verify at 12/11/22 1325    PHYSICAL EXAMINATION: ECOG PERFORMANCE STATUS: 1 - Symptomatic but completely  ambulatory  Vitals:   12/11/22 0943  BP: 128/78  Pulse: 95  Resp: 18  Temp: 98.1 F (36.7 C)  SpO2: 100%   Wt Readings from Last 3 Encounters:  12/11/22 181 lb 8 oz (82.3 kg)  11/20/22 177 lb 3.2 oz (80.4 kg)  11/06/22 179 lb (81.2 kg)     GENERAL:alert, no distress and comfortable SKIN: skin color, texture, turgor are normal, no rashes or significant lesions EYES: normal, Conjunctiva are pink and non-injected, sclera clear NECK: supple, thyroid normal size, non-tender, without nodularity LYMPH:  no palpable lymphadenopathy in the cervical, axillary  LUNGS: clear to auscultation and percussion with normal breathing effort HEART: regular rate & rhythm and no murmurs and no lower extremity edema ABDOMEN:abdomen soft, non-tender and normal bowel sounds Musculoskeletal:no cyanosis of digits and no clubbing  NEURO: alert & oriented x 3 with fluent speech, no focal motor/sensory deficits   LABORATORY DATA:  I have reviewed the data as listed    Latest Ref Rng & Units 12/11/2022    9:18 AM 11/20/2022    7:51 AM 11/06/2022    8:38 AM  CBC  WBC 4.0 - 10.5 K/uL 6.8  5.1  6.1   Hemoglobin 13.0 - 17.0 g/dL 98.1  19.1  47.8   Hematocrit 39.0 - 52.0 % 33.4  33.4  32.5   Platelets 150 - 400 K/uL 263  208  189         Latest Ref Rng & Units 12/11/2022    9:18 AM 11/20/2022    7:51 AM  11/06/2022    8:38 AM  CMP  Glucose 70 - 99 mg/dL 409  811  914   BUN 6 - 20 mg/dL 8  12  10    Creatinine 0.61 - 1.24 mg/dL 7.82  9.56  2.13   Sodium 135 - 145 mmol/L 140  141  140   Potassium 3.5 - 5.1 mmol/L 3.8  4.0  4.1   Chloride 98 - 111 mmol/L 106  107  106   CO2 22 - 32 mmol/L 30  28  29    Calcium 8.9 - 10.3 mg/dL 9.1  9.3  9.0   Total Protein 6.5 - 8.1 g/dL 6.4  6.5  6.3   Total Bilirubin <1.2 mg/dL 0.4  0.5  0.5   Alkaline Phos 38 - 126 U/L 95  73  74   AST 15 - 41 U/L 15  14  13    ALT 0 - 44 U/L 10  8  7        RADIOGRAPHIC STUDIES: I have personally reviewed the radiological  images as listed and agreed with the findings in the report. No results found.    Orders Placed This Encounter  Procedures   CBC with Differential (Cancer Center Only)    Standing Status:   Future    Standing Expiration Date:   01/22/2024   CMP (Cancer Center only)    Standing Status:   Future    Standing Expiration Date:   01/22/2024   CBC with Differential (Cancer Center Only)    Standing Status:   Future    Standing Expiration Date:   02/05/2024   CMP (Cancer Center only)    Standing Status:   Future    Standing Expiration Date:   02/05/2024   CBC with Differential (Cancer Center Only)    Standing Status:   Future    Standing Expiration Date:   02/19/2024   CMP (Cancer Center only)    Standing Status:   Future    Standing Expiration Date:   02/19/2024   All questions were answered. The patient knows to call the clinic with any problems, questions or concerns. No barriers to learning was detected. The total time spent in the appointment was 30 minutes.     Malachy Mood, MD 12/11/2022

## 2022-12-12 ENCOUNTER — Other Ambulatory Visit: Payer: Self-pay

## 2022-12-12 DIAGNOSIS — C163 Malignant neoplasm of pyloric antrum: Secondary | ICD-10-CM

## 2022-12-12 NOTE — Progress Notes (Signed)
Verbal order w/readback from Dr. Mosetta Putt for [facility] Tissue & blood test for Case#[id number].  Order submitted electronically to [facility].  Molecular sample will be drawn on 12/25/2022 when pt returns to clinic.

## 2022-12-13 ENCOUNTER — Inpatient Hospital Stay: Payer: BC Managed Care – PPO

## 2022-12-13 VITALS — BP 127/70 | HR 58 | Temp 97.8°F | Resp 16

## 2022-12-13 DIAGNOSIS — C163 Malignant neoplasm of pyloric antrum: Secondary | ICD-10-CM

## 2022-12-13 DIAGNOSIS — Z5111 Encounter for antineoplastic chemotherapy: Secondary | ICD-10-CM | POA: Diagnosis not present

## 2022-12-13 MED ORDER — SODIUM CHLORIDE 0.9% FLUSH
10.0000 mL | INTRAVENOUS | Status: DC | PRN
  Administered 2022-12-13: 10 mL

## 2022-12-13 MED ORDER — HEPARIN SOD (PORK) LOCK FLUSH 100 UNIT/ML IV SOLN
500.0000 [IU] | Freq: Once | INTRAVENOUS | Status: AC | PRN
  Administered 2022-12-13: 500 [IU]

## 2022-12-24 MED FILL — Fosaprepitant Dimeglumine For IV Infusion 150 MG (Base Eq): INTRAVENOUS | Qty: 5 | Status: AC

## 2022-12-25 ENCOUNTER — Inpatient Hospital Stay: Payer: BC Managed Care – PPO

## 2022-12-25 ENCOUNTER — Inpatient Hospital Stay (HOSPITAL_BASED_OUTPATIENT_CLINIC_OR_DEPARTMENT_OTHER): Payer: BC Managed Care – PPO | Admitting: Nurse Practitioner

## 2022-12-25 VITALS — BP 108/61 | HR 70 | Temp 98.2°F | Resp 17 | Wt 183.5 lb

## 2022-12-25 VITALS — BP 108/61 | HR 90 | Temp 98.2°F | Resp 17 | Wt 183.0 lb

## 2022-12-25 DIAGNOSIS — Z5111 Encounter for antineoplastic chemotherapy: Secondary | ICD-10-CM | POA: Diagnosis not present

## 2022-12-25 DIAGNOSIS — Z95828 Presence of other vascular implants and grafts: Secondary | ICD-10-CM

## 2022-12-25 DIAGNOSIS — C163 Malignant neoplasm of pyloric antrum: Secondary | ICD-10-CM

## 2022-12-25 LAB — CMP (CANCER CENTER ONLY)
ALT: 10 U/L (ref 0–44)
AST: 13 U/L — ABNORMAL LOW (ref 15–41)
Albumin: 3.7 g/dL (ref 3.5–5.0)
Alkaline Phosphatase: 82 U/L (ref 38–126)
Anion gap: 5 (ref 5–15)
BUN: 11 mg/dL (ref 6–20)
CO2: 28 mmol/L (ref 22–32)
Calcium: 8.9 mg/dL (ref 8.9–10.3)
Chloride: 108 mmol/L (ref 98–111)
Creatinine: 0.96 mg/dL (ref 0.61–1.24)
GFR, Estimated: >60 mL/min (ref >60)
Glucose, Bld: 100 mg/dL — ABNORMAL HIGH (ref 70–99)
Potassium: 4.3 mmol/L (ref 3.5–5.1)
Sodium: 141 mmol/L (ref 135–145)
Total Bilirubin: 0.3 mg/dL (ref <1.2)
Total Protein: 6 g/dL — ABNORMAL LOW (ref 6.5–8.1)

## 2022-12-25 LAB — CBC WITH DIFFERENTIAL (CANCER CENTER ONLY)
Abs Immature Granulocytes: 0.03 10*3/uL (ref 0.00–0.07)
Basophils Absolute: 0.1 10*3/uL (ref 0.0–0.1)
Basophils Relative: 1 %
Eosinophils Absolute: 0.2 10*3/uL (ref 0.0–0.5)
Eosinophils Relative: 3 %
HCT: 30.9 % — ABNORMAL LOW (ref 39.0–52.0)
Hemoglobin: 10.2 g/dL — ABNORMAL LOW (ref 13.0–17.0)
Immature Granulocytes: 1 %
Lymphocytes Relative: 39 %
Lymphs Abs: 2.4 10*3/uL (ref 0.7–4.0)
MCH: 29 pg (ref 26.0–34.0)
MCHC: 33 g/dL (ref 30.0–36.0)
MCV: 87.8 fL (ref 80.0–100.0)
Monocytes Absolute: 0.8 10*3/uL (ref 0.1–1.0)
Monocytes Relative: 13 %
Neutro Abs: 2.8 10*3/uL (ref 1.7–7.7)
Neutrophils Relative %: 43 %
Platelet Count: 238 10*3/uL (ref 150–400)
RBC: 3.52 MIL/uL — ABNORMAL LOW (ref 4.22–5.81)
RDW: 20.1 % — ABNORMAL HIGH (ref 11.5–15.5)
WBC Count: 6.3 10*3/uL (ref 4.0–10.5)
nRBC: 0 % (ref 0.0–0.2)

## 2022-12-25 LAB — MISCELLANEOUS TEST

## 2022-12-25 MED ORDER — SODIUM CHLORIDE 0.9 % IV SOLN
400.0000 mg/m2 | Freq: Once | INTRAVENOUS | Status: AC
  Administered 2022-12-25: 760 mg via INTRAVENOUS
  Filled 2022-12-25: qty 25

## 2022-12-25 MED ORDER — DEXAMETHASONE SODIUM PHOSPHATE 10 MG/ML IJ SOLN
10.0000 mg | Freq: Once | INTRAMUSCULAR | Status: AC
Start: 2022-12-25 — End: 2022-12-25
  Administered 2022-12-25: 10 mg via INTRAVENOUS
  Filled 2022-12-25: qty 1

## 2022-12-25 MED ORDER — PALONOSETRON HCL INJECTION 0.25 MG/5ML
0.2500 mg | Freq: Once | INTRAVENOUS | Status: AC
  Administered 2022-12-25: 0.25 mg via INTRAVENOUS
  Filled 2022-12-25: qty 5

## 2022-12-25 MED ORDER — FOSAPREPITANT DIMEGLUMINE INJECTION 150 MG
150.0000 mg | Freq: Once | INTRAVENOUS | Status: AC
  Administered 2022-12-25: 150 mg via INTRAVENOUS
  Filled 2022-12-25: qty 150

## 2022-12-25 MED ORDER — IRINOTECAN HCL CHEMO INJECTION 100 MG/5ML
180.0000 mg/m2 | Freq: Once | INTRAVENOUS | Status: AC
  Administered 2022-12-25: 340 mg via INTRAVENOUS
  Filled 2022-12-25: qty 12

## 2022-12-25 MED ORDER — SODIUM CHLORIDE 0.9 % IV SOLN: Freq: Once | INTRAVENOUS | Status: AC

## 2022-12-25 MED ORDER — SODIUM CHLORIDE 0.9 % IV SOLN
2400.0000 mg/m2 | INTRAVENOUS | Status: DC
  Administered 2022-12-25: 5000 mg via INTRAVENOUS
  Filled 2022-12-25: qty 100

## 2022-12-25 MED ORDER — SODIUM CHLORIDE 0.9% FLUSH
10.0000 mL | Freq: Once | INTRAVENOUS | Status: AC
  Administered 2022-12-25: 10 mL

## 2022-12-25 NOTE — Progress Notes (Signed)
Per Vincent Gros, NP okay to increase rate of 5FU pump to infused over 44 hours.

## 2022-12-25 NOTE — Patient Instructions (Signed)
[facility] - A DEPT OF [facility]  Discharge Instructions: Thank you for choosing [facility] to provide your oncology and hematology care.   If you have a lab appointment with the Cancer Center, please go directly to the Cancer Center and check in at the registration area.   Wear comfortable clothing and clothing appropriate for easy access to any Portacath or PICC line.   We strive to give you quality time with your provider. You may need to reschedule your appointment if you arrive late (15 or more minutes).  Arriving late affects you and other patients whose appointments are after yours.  Also, if you miss three or more appointments without notifying the office, you may be dismissed from the clinic at the provider's discretion.      For prescription refill requests, have your pharmacy contact our office and allow 72 hours for refills to be completed.    Today you received the following chemotherapy and/or immunotherapy agents: Irinotecan/Leucovorin/5Fu      To help prevent nausea and vomiting after your treatment, we encourage you to take your nausea medication as directed.  BELOW ARE SYMPTOMS THAT SHOULD BE REPORTED IMMEDIATELY: *FEVER GREATER THAN 100.4 F (38 C) OR HIGHER *CHILLS OR SWEATING *NAUSEA AND VOMITING THAT IS NOT CONTROLLED WITH YOUR NAUSEA MEDICATION *UNUSUAL SHORTNESS OF BREATH *UNUSUAL BRUISING OR BLEEDING *URINARY PROBLEMS (pain or burning when urinating, or frequent urination) *BOWEL PROBLEMS (unusual diarrhea, constipation, pain near the anus) TENDERNESS IN MOUTH AND THROAT WITH OR WITHOUT PRESENCE OF ULCERS (sore throat, sores in mouth, or a toothache) UNUSUAL RASH, SWELLING OR PAIN  UNUSUAL VAGINAL DISCHARGE OR ITCHING   Items with * indicate a potential emergency and should be followed up as soon as possible or go to the Emergency Department if any problems should occur.  Please show the CHEMOTHERAPY ALERT CARD  or IMMUNOTHERAPY ALERT CARD at check-in to the Emergency Department and triage nurse.  Should you have questions after your visit or need to cancel or reschedule your appointment, please contact [facility] - A DEPT OF [facility]  Dept: [phone]  and follow the prompts.  Office hours are 8:00 a.m. to 4:30 p.m. Monday - Friday. Please note that voicemails left after 4:00 p.m. may not be returned until the following business day.  We are closed weekends and major holidays. You have access to a nurse at all times for urgent questions. Please call the main number to the clinic Dept: [phone] and follow the prompts.   For any non-urgent questions, you may also contact your provider using MyChart. We now offer e-Visits for anyone 51 and older to request care online for non-urgent symptoms. For details visit [email or web address].   Also download the MyChart app! Go to the app store, search "MyChart", open the app, select [facility], and log in with your MyChart username and password.

## 2022-12-25 NOTE — Assessment & Plan Note (Addendum)
cT3N3Mx, with hypermetabolic left supraclavicular node, and indeterminate adrenal nodule, MMR normal ypT3ypN3a, liver and nodes recurrence in 07/2022 -diagnosed 10/18/21 by EGD for 6 month f/u of gastric ulcers, showed mucosal intramucosal adenocarcinoma. MMR normal. -baseline CEA 11/01/21 significantly elevated at 1,989.58. -EUS on 11/06/21 by Dr. Dulce Sellar, staged as T3 N3 (at least stage III) -staging CT CAP 11/08/21 showed: stable gastric antrum wall thickening; increased size of upper abdominal lymph nodes; stable left adrenal nodule, 1.9 cm. -PET scan showed FDG avid lymph nodes within the gastrohepatic ligament, portacaval region, and mesentery. Tracer avid left supraclavicular lymph node and indeterminate left adrenal gland nodule with mild FDG uptake. -he underwent [place] node biopsy on 1/17 which was negative for malignant cells, but no lymphoid tissue seen on biopsy  -repeated PET on 02/07/22 after 3 months neoadjuvant chemo showed resolved left supraclavicular lymph node, and significant improvement in regional lymph nodes and the primary tumor, stable and indeterminate left adrenal nodule.  -CT adrenal protocol showed the left adrenal nodule is likely benign adenoma, no biopsy is needed. -Completed neoadjvant chemo FLOT s/p 12 cycles 11/22/21 - 04/27/2022 -restaging PET on 05/16/22 showed significantly hypermetabolic activity at the primary gastric cancer in pylorus, and a small hypermetabolic mesenteric lymph node, no other evidence of metastasis. surgeon Dr. Sheliah Hatch aware of PET findings -S/p distal gastrectomy 05/20/22, path showed ypT3N3a with clear margins, 7/18 + LNs, and lymphovascular invasion identified. There was some treatment effect in a few lymph nodes nut not in the primary tumor -due to rising tumor marker, he underwent PET scan on July 07, 2022, which unfortunately showed hypermetabolic new adenopathy in right hilar and mesentery, diffuse liver metastasis, consistent with metastatic  recurrence.  I personally reviewed the PET scan images with patient -Unfortunately his disease is not curable at this stage. -The goal of chemotherapy is palliative, to prolong his life -I discussed his next generation sequencing foundation one result, which showed EGFR amplification, no other targetable mutations.  His tumor was previously tested for HER2 which was negative (0-1+).  PD-L1 CPS score was 2%, no significant benefit of immunotherapy.  -He started paclitaxel and ramucirumab on 07/25/2022  He developed worsening neuropathy quickly, and I had to switch his treatment to FOLFIRI on 08/29/2022, every 2 weeks, he is overall tolerating well. -Today is cycle 9 day 1.  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  This is improving.

## 2022-12-25 NOTE — Progress Notes (Signed)
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 MMR normal. -baseline CEA 11/01/21 significantly elevated at 1,989.58. -EUS on 11/06/21 by Dr. Dulce Sellar, staged as T3 N3 (at least stage III) -staging CT CAP 11/08/21 showed: stable gastric antrum wall thickening; increased size of upper abdominal lymph nodes; stable left adrenal nodule, 1.9 cm. -PET scan showed FDG avid lymph nodes within the gastrohepatic ligament, portacaval region, and mesentery. Tracer avid left supraclavicular lymph node and indeterminate left adrenal gland nodule with mild FDG uptake. -he underwent [place] node biopsy on 1/17 which was negative for malignant cells, but no lymphoid tissue seen on biopsy  -repeated PET on 02/07/22 after 3 months neoadjuvant chemo showed resolved left supraclavicular lymph node, and significant improvement in regional lymph nodes and the primary tumor, stable and indeterminate left adrenal nodule.  -CT adrenal protocol showed the left adrenal nodule is likely benign adenoma, no biopsy is needed. -Completed neoadjvant chemo FLOT s/p 12 cycles 11/22/21 - 04/27/2022 -restaging PET on 05/16/22 showed significantly hypermetabolic activity at the primary gastric cancer in pylorus, and a small hypermetabolic mesenteric lymph node, no other evidence of metastasis. surgeon Dr. Sheliah Hatch aware of PET findings -S/p distal gastrectomy 05/20/22, path showed ypT3N3a with clear margins, 7/18 + LNs, and lymphovascular invasion identified. There was some treatment effect in a few lymph  nodes nut not in the primary tumor -due to rising tumor marker, he underwent PET scan on July 07, 2022, which unfortunately showed hypermetabolic new adenopathy in right hilar and mesentery, diffuse liver metastasis, consistent with metastatic recurrence.  I personally reviewed the PET scan images with patient -Unfortunately his disease is not curable at this stage. -The goal of chemotherapy is palliative, to prolong his life -I discussed his next generation sequencing foundation one result, which showed EGFR amplification, no other targetable mutations.  His tumor was previously tested for HER2 which was negative (0-1+).  PD-L1 CPS score was 2%, no significant benefit of immunotherapy.  -He started paclitaxel and ramucirumab on 07/25/2022  He developed worsening neuropathy quickly, and I had to switch his treatment to FOLFIRI on 08/29/2022, every 2 weeks, he is overall tolerating well. -Today is cycle 9 day 1.  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  This is improving.    Plan: Patient seen in infusion. Labs reviewed  -CBC showing WBC 6.3; Hgb 10.2; Hct 30.9; Plt 238; Anc 2.8 -CMP - K 4.3; glucose 100; BUN 11; Creatinine 0.96; eGFR > 60; Ca 8.9; LFTs normal.   -Additional blood for Caris testing. Patient condition and labs are adequate for treatment today. Proceed with cycle 9 day 1 FOLFIRI.  The patient understands the plans discussed today and is in agreement with them.  He knows to contact our office if he develops concerns prior to his next appointment.  I provided 25 minutes of face-to-face time during this encounter and > 50% was spent counseling as documented under my assessment and plan.    Carlean Jews, NP  [facility] - A DEPT OF [facility] [address]  [place] [address] Dept: [phone] Dept Fax: [phone]   No orders of the defined types were placed in this encounter.      CHIEF COMPLAINT:  CC: gastric cancer   Current Treatment:  FOLFIRI every 2 weeks   INTERVAL HISTORY:  Yuriah is here today for repeat clinical assessment.  Was last seen by Dr. Mosetta Putt 12/11/2022.  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  Gradually this is improving.  No new symptoms or complaints.  He denies chest pain, chest pressure, or shortness of breath. He denies headaches or visual disturbances. He denies abdominal pain, nausea, vomiting, or changes in bowel or bladder habits.  He denies fevers or chills. He denies pain. His appetite is good. His weight has been stable.  I have reviewed the past medical history, past surgical history, social history and family history with the patient and they are unchanged from previous note.  ALLERGIES:  has no known allergies.  MEDICATIONS:  Current Outpatient Medications  Medication Sig Dispense Refill   acetaminophen (TYLENOL) 500 MG tablet Take 1 tablet (500 mg total) by mouth 2 (two) times daily. 60 tablet 3   dexamethasone (DECADRON) 4 MG tablet Take 1-2 tablets (4-8 mg total) by mouth daily. Start the day after chemotherapy for 1-2 days.  Take with food. 20 tablet 1   gabapentin (NEURONTIN) 100 MG capsule Take 1-2 capsules (100-200 mg total) by mouth 4 (four) times daily as needed. OK to increase to 2 or 3 capsules in a few weeks if tolerates well 240 capsule 2   ondansetron (ZOFRAN-ODT) 4 MG disintegrating tablet Dissolve 1 tablet (4 mg total) by mouth every 6 (six) hours as needed for nausea or vomiting. 30 tablet 1   pantoprazole (PROTONIX) 40 MG tablet Take 1 tablet (40 mg) by mouth 2 times daily. 60 tablet 6   prochlorperazine (COMPAZINE) 10 MG tablet Take 1 tablet (10 mg total) by mouth every 6 (six) hours as needed for nausea or vomiting. 30 tablet 2   No current facility-administered medications for this visit.    HISTORY OF PRESENT ILLNESS:   Oncology History Overview Note   Cancer Staging  Gastric  cancer [facility]) Staging form: Stomach, AJCC 8th Edition - Clinical stage from 10/18/2021: Stage III (cT3, cN3a, cM0) - Signed by Malachy Mood, MD on 11/10/2021     Gastric cancer (HCC)  10/18/2021 Procedure   EGD performed under the care of Dr. Bosie Clos  Findings:  -A large, ulcerated, partially circumferential (involving two thirds of the lumen circumference) mass with no bleeding and stigmata of recent bleeding was found in the gastric antrum, at the pylorus and in the prepyloric region of the stomach. -Segmental severe inflammation characterized by congestion (edema) and erythema was found in the gastric antrum.   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 Negative for dysplasia.  Negative for helicobacter pylori  Mismatch Repair (MMR) Protein Imunohistochemistry (IHC): IHC Expression Result: MLH1: Preserved nuclear expression. MSH2: Preserved nuclear expression. MSH6: Preserved nuclear expression. PMS2: Preserved nuclear expression. Interpretation: NORMAL   10/31/2021 Initial Diagnosis   Gastric cancer (HCC)   11/01/2021 Tumor Marker  CEA = 1,191.47 (^)   11/06/2021 Procedure   Upper EUS, Dr. Dulce Sellar  Impression: - Normal esophagus. - A small amount of food (residue) in the stomach. - Congested, friable (with contact bleeding) and ulcerated mucosa in the prepyloric region of the stomach. - Normal examined duodenum. - There was no evidence of significant pathology in the left lobe of the liver. - Many abnormal lymph nodes (over 10) were visualized in the  gastrohepatic ligament (level 18), celiac region (level 20), perigastric region, peripancreatic region and aortocaval region. - Wall thickening was seen in the prepyloric region of the stomach. The thickening appeared to be primarily within the deep mucosa (Layer 2) but extended through the muscularis propria. - Overall constellation of findings consistent with at least T3 N3 Mx (at least stage III) gastric adenocarcinoma.   11/08/2021 Imaging   EXAM: CT CHEST, ABDOMEN, AND PELVIS WITH CONTRAST  IMPRESSION: 1. Similar irregular wall thickening of the gastric antrum, consistent with patient's known primary gastric neoplasm. 2. Increased size of the upper abdominal lymph nodes, concerning for worsening nodal disease involvement. 3. Stable left adrenal nodule common nonspecific possibly a metastatic lesion or adenoma. 4. No evidence of metastatic disease in the chest. 5. Left-sided colonic diverticulosis without findings of acute diverticulitis. 6. Enlarged prostate gland. 7.  Aortic Atherosclerosis (ICD10-I70.0).   11/22/2021 - 04/26/2022 Chemotherapy   Patient is on Treatment Plan : GASTROESOPHAGEAL FLOT q14d X 4 cycles     11/27/2021 Imaging    IMPRESSION: 1. The mass within the gastric antrum is mildly decreased in size when compared with 11/08/2021. There is persistent FDG uptake associated with this mass compatible with residual tumor. 2. Persistent FDG avid lymph nodes within the gastrohepatic ligament, portacaval region, and mesentery. These are mildly decreased in size when compared with 11/08/2021. 3. Tracer avid left supraclavicular lymph node is also mildly decreased in size when compared with 11/08/2021. 4. Indeterminate left adrenal gland nodule with mild FDG uptake. This may represent a lipid poor adenoma. Metastatic disease not exclude. 5. Diffuse increased uptake throughout the bone marrow is favored to represent treatment related change. 6.  Aortic Atherosclerosis  (ICD10-I70.0).     02/07/2022 Imaging    IMPRESSION: 1. No substantial change in hypermetabolism associated with the distal gastric lesion. Although difficult to discern on noncontrast CT imaging, the soft tissue component appears to be decreased since the prior PET-CT. 2. Interval resolution of the hypermetabolic left supraclavicular lymph node. 3. Interval marked decrease of the hypermetabolic gastrohepatic ligament lymphadenopathy. Similar decrease in size and hypermetabolism associated with index nodes identified previously in the 4. porta hepatis and hypogastric region. 5. Stable left adrenal nodule with slight hypermetabolism. Nonspecific finding could represent lipid poor adenoma or metastatic deposit. 6. No new suspicious hypermetabolic disease on today's study. 7.  Aortic Atherosclerosis (ICD10-I70.0).   07/25/2022 - 08/15/2022 Chemotherapy   Patient is on Treatment Plan : GASTROESOPHAGEAL Ramucirumab D1, 15 + Paclitaxel D1,8,15 q28d     08/29/2022 -  Chemotherapy   Patient is on Treatment Plan : COLORECTAL FOLFIRI q14d         REVIEW OF SYSTEMS:   Constitutional: Denies fevers, chills or abnormal weight loss Eyes: Denies blurriness of vision Ears, nose, mouth, throat, and face: Denies mucositis or sore throat Respiratory: Denies cough, dyspnea or wheezes Cardiovascular: Denies palpitation, chest discomfort or lower extremity swelling Gastrointestinal:  Denies nausea, heartburn or change in bowel habits Skin: Denies abnormal skin rashes Lymphatics: Denies new lymphadenopathy or easy bruising Neurological:Denies numbness, tingling  or new weaknesses Behavioral/Psych: Mood is stable, no new changes  All other systems were reviewed with the patient and are negative.   VITALS:   Today's Vitals   01/04/23 1347  BP: 108/61  Pulse: 90  Resp: 17  Temp: 98.2 F (36.8 C)  SpO2: 100%  Weight: 183 lb (83 kg)   Body mass index is 30.45 kg/m.   Wt Readings from Last  3 Encounters:  01/04/23 183 lb (83 kg)  12/25/22 183 lb 8 oz (83.2 kg)  12/11/22 181 lb 8 oz (82.3 kg)    Body mass index is 30.45 kg/m.  Performance status (ECOG): 1 - Symptomatic but completely ambulatory  PHYSICAL EXAM:   GENERAL:alert, no distress and comfortable SKIN: skin color, texture, turgor are normal, no rashes or significant lesions EYES: normal, Conjunctiva are pink and non-injected, sclera clear OROPHARYNX:no exudate, no erythema and lips, buccal mucosa, and tongue normal  NECK: supple, thyroid normal size, non-tender, without nodularity LYMPH:  no palpable lymphadenopathy in the cervical, axillary or inguinal LUNGS: clear to auscultation and percussion with normal breathing effort HEART: regular rate & rhythm and no murmurs and no lower extremity edema ABDOMEN:abdomen soft, non-tender and normal bowel sounds Musculoskeletal:no cyanosis of digits and no clubbing  NEURO: alert & oriented x 3 with fluent speech, no focal motor/sensory deficits  LABORATORY DATA:  I have reviewed the data as listed    Component Value Date/Time   NA 141 12/25/2022 1245   K 4.3 12/25/2022 1245   CL 108 12/25/2022 1245   CO2 28 12/25/2022 1245   GLUCOSE 100 (H) 12/25/2022 1245   BUN 11 12/25/2022 1245   CREATININE 0.96 12/25/2022 1245   CALCIUM 8.9 12/25/2022 1245   PROT 6.0 (L) 12/25/2022 1245   ALBUMIN 3.7 12/25/2022 1245   AST 13 (L) 12/25/2022 1245   ALT 10 12/25/2022 1245   ALKPHOS 82 12/25/2022 1245   BILITOT 0.3 12/25/2022 1245   GFRNONAA >60 12/25/2022 1245    Lab Results  Component Value Date   WBC 6.3 12/25/2022   NEUTROABS 2.8 12/25/2022   HGB 10.2 (L) 12/25/2022   HCT 30.9 (L) 12/25/2022   MCV 87.8 12/25/2022   PLT 238 12/25/2022

## 2022-12-27 ENCOUNTER — Inpatient Hospital Stay: Payer: BC Managed Care – PPO

## 2022-12-27 VITALS — BP 125/70 | HR 55 | Temp 97.3°F | Resp 17

## 2022-12-27 DIAGNOSIS — Z5111 Encounter for antineoplastic chemotherapy: Secondary | ICD-10-CM | POA: Diagnosis not present

## 2022-12-27 DIAGNOSIS — C163 Malignant neoplasm of pyloric antrum: Secondary | ICD-10-CM

## 2022-12-27 MED ORDER — SODIUM CHLORIDE 0.9% FLUSH
10.0000 mL | INTRAVENOUS | Status: DC | PRN
  Administered 2022-12-27: 10 mL

## 2022-12-27 MED ORDER — HEPARIN SOD (PORK) LOCK FLUSH 100 UNIT/ML IV SOLN
500.0000 [IU] | Freq: Once | INTRAVENOUS | Status: AC | PRN
Start: 2022-12-27 — End: 2022-12-27
  Administered 2022-12-27: 500 [IU]

## 2023-01-04 ENCOUNTER — Encounter: Payer: Self-pay | Admitting: Hematology

## 2023-01-04 ENCOUNTER — Encounter: Payer: Self-pay | Admitting: Nurse Practitioner

## 2023-01-04 ENCOUNTER — Encounter: Payer: Self-pay | Admitting: Physician Assistant

## 2023-01-05 ENCOUNTER — Encounter: Payer: Self-pay | Admitting: Hematology

## 2023-01-06 MED FILL — Fosaprepitant Dimeglumine For IV Infusion 150 MG (Base Eq): INTRAVENOUS | Qty: 5 | Status: AC

## 2023-01-07 ENCOUNTER — Other Ambulatory Visit: Payer: Self-pay | Admitting: Nurse Practitioner

## 2023-01-07 DIAGNOSIS — C163 Malignant neoplasm of pyloric antrum: Secondary | ICD-10-CM

## 2023-01-08 ENCOUNTER — Inpatient Hospital Stay: Payer: BC Managed Care – PPO

## 2023-01-08 ENCOUNTER — Inpatient Hospital Stay (HOSPITAL_BASED_OUTPATIENT_CLINIC_OR_DEPARTMENT_OTHER): Payer: BC Managed Care – PPO | Admitting: Nurse Practitioner

## 2023-01-08 ENCOUNTER — Inpatient Hospital Stay: Payer: BC Managed Care – PPO | Attending: Physician Assistant

## 2023-01-08 ENCOUNTER — Encounter: Payer: Self-pay | Admitting: Hematology

## 2023-01-08 ENCOUNTER — Encounter: Payer: Self-pay | Admitting: Nurse Practitioner

## 2023-01-08 VITALS — BP 133/88 | HR 71 | Resp 16

## 2023-01-08 VITALS — BP 119/76 | HR 80 | Temp 98.1°F | Resp 19 | Ht 65.0 in | Wt 187.0 lb

## 2023-01-08 DIAGNOSIS — C787 Secondary malignant neoplasm of liver and intrahepatic bile duct: Secondary | ICD-10-CM | POA: Insufficient documentation

## 2023-01-08 DIAGNOSIS — C163 Malignant neoplasm of pyloric antrum: Secondary | ICD-10-CM | POA: Insufficient documentation

## 2023-01-08 DIAGNOSIS — T451X5D Adverse effect of antineoplastic and immunosuppressive drugs, subsequent encounter: Secondary | ICD-10-CM | POA: Diagnosis not present

## 2023-01-08 DIAGNOSIS — Z5112 Encounter for antineoplastic immunotherapy: Secondary | ICD-10-CM | POA: Insufficient documentation

## 2023-01-08 DIAGNOSIS — Z452 Encounter for adjustment and management of vascular access device: Secondary | ICD-10-CM | POA: Diagnosis not present

## 2023-01-08 DIAGNOSIS — Z79899 Other long term (current) drug therapy: Secondary | ICD-10-CM | POA: Insufficient documentation

## 2023-01-08 DIAGNOSIS — E278 Other specified disorders of adrenal gland: Secondary | ICD-10-CM | POA: Diagnosis not present

## 2023-01-08 DIAGNOSIS — G620 Drug-induced polyneuropathy: Secondary | ICD-10-CM | POA: Insufficient documentation

## 2023-01-08 DIAGNOSIS — Z7952 Long term (current) use of systemic steroids: Secondary | ICD-10-CM | POA: Insufficient documentation

## 2023-01-08 DIAGNOSIS — Z95828 Presence of other vascular implants and grafts: Secondary | ICD-10-CM

## 2023-01-08 DIAGNOSIS — E669 Obesity, unspecified: Secondary | ICD-10-CM | POA: Insufficient documentation

## 2023-01-08 DIAGNOSIS — Z5111 Encounter for antineoplastic chemotherapy: Secondary | ICD-10-CM | POA: Diagnosis present

## 2023-01-08 LAB — CBC WITH DIFFERENTIAL (CANCER CENTER ONLY)
Abs Immature Granulocytes: 0.02 10*3/uL (ref 0.00–0.07)
Basophils Absolute: 0 10*3/uL (ref 0.0–0.1)
Basophils Relative: 1 %
Eosinophils Absolute: 0.2 10*3/uL (ref 0.0–0.5)
Eosinophils Relative: 3 %
HCT: 32.3 % — ABNORMAL LOW (ref 39.0–52.0)
Hemoglobin: 10.7 g/dL — ABNORMAL LOW (ref 13.0–17.0)
Immature Granulocytes: 0 %
Lymphocytes Relative: 36 %
Lymphs Abs: 2.1 10*3/uL (ref 0.7–4.0)
MCH: 29.3 pg (ref 26.0–34.0)
MCHC: 33.1 g/dL (ref 30.0–36.0)
MCV: 88.5 fL (ref 80.0–100.0)
Monocytes Absolute: 0.8 10*3/uL (ref 0.1–1.0)
Monocytes Relative: 14 %
Neutro Abs: 2.7 10*3/uL (ref 1.7–7.7)
Neutrophils Relative %: 46 %
Platelet Count: 194 10*3/uL (ref 150–400)
RBC: 3.65 MIL/uL — ABNORMAL LOW (ref 4.22–5.81)
RDW: 19.9 % — ABNORMAL HIGH (ref 11.5–15.5)
WBC Count: 5.8 10*3/uL (ref 4.0–10.5)
nRBC: 0 % (ref 0.0–0.2)

## 2023-01-08 LAB — CMP (CANCER CENTER ONLY)
ALT: 11 U/L (ref 0–44)
AST: 14 U/L — ABNORMAL LOW (ref 15–41)
Albumin: 3.7 g/dL (ref 3.5–5.0)
Alkaline Phosphatase: 93 U/L (ref 38–126)
Anion gap: 5 (ref 5–15)
BUN: 7 mg/dL (ref 6–20)
CO2: 27 mmol/L (ref 22–32)
Calcium: 8.9 mg/dL (ref 8.9–10.3)
Chloride: 111 mmol/L (ref 98–111)
Creatinine: 0.97 mg/dL (ref 0.61–1.24)
GFR, Estimated: >60 mL/min (ref >60)
Glucose, Bld: 106 mg/dL — ABNORMAL HIGH (ref 70–99)
Potassium: 4.1 mmol/L (ref 3.5–5.1)
Sodium: 143 mmol/L (ref 135–145)
Total Bilirubin: 0.3 mg/dL (ref 0.0–1.2)
Total Protein: 6.3 g/dL — ABNORMAL LOW (ref 6.5–8.1)

## 2023-01-08 MED ORDER — SODIUM CHLORIDE 0.9% FLUSH: 10.0000 mL | INTRAVENOUS | Status: DC | PRN

## 2023-01-08 MED ORDER — DEXAMETHASONE SODIUM PHOSPHATE 10 MG/ML IJ SOLN
10.0000 mg | Freq: Once | INTRAMUSCULAR | Status: AC
  Administered 2023-01-08: 10 mg via INTRAVENOUS
  Filled 2023-01-08: qty 1

## 2023-01-08 MED ORDER — HEPARIN SOD (PORK) LOCK FLUSH 100 UNIT/ML IV SOLN
500.0000 [IU] | Freq: Once | INTRAVENOUS | Status: DC | PRN
Start: 2023-01-08 — End: 2023-01-08

## 2023-01-08 MED ORDER — SODIUM CHLORIDE 0.9 % IV SOLN
150.0000 mg | Freq: Once | INTRAVENOUS | Status: AC
  Administered 2023-01-08: 150 mg via INTRAVENOUS
  Filled 2023-01-08: qty 150

## 2023-01-08 MED ORDER — SODIUM CHLORIDE 0.9 % IV SOLN
400.0000 mg/m2 | Freq: Once | INTRAVENOUS | Status: AC
  Administered 2023-01-08: 760 mg via INTRAVENOUS
  Filled 2023-01-08: qty 25

## 2023-01-08 MED ORDER — SODIUM CHLORIDE 0.9% FLUSH
10.0000 mL | Freq: Once | INTRAVENOUS | Status: AC
  Administered 2023-01-08: 10 mL

## 2023-01-08 MED ORDER — SODIUM CHLORIDE 0.9 % IV SOLN
180.0000 mg/m2 | Freq: Once | INTRAVENOUS | Status: AC
Start: 2023-01-08 — End: 2023-01-08
  Administered 2023-01-08: 340 mg via INTRAVENOUS
  Filled 2023-01-08: qty 15

## 2023-01-08 MED ORDER — PALONOSETRON HCL INJECTION 0.25 MG/5ML
0.2500 mg | Freq: Once | INTRAVENOUS | Status: AC
  Administered 2023-01-08: 0.25 mg via INTRAVENOUS
  Filled 2023-01-08: qty 5

## 2023-01-08 MED ORDER — SODIUM CHLORIDE 0.9 % IV SOLN
2400.0000 mg/m2 | INTRAVENOUS | Status: DC
  Administered 2023-01-08: 5000 mg via INTRAVENOUS
  Filled 2023-01-08: qty 100

## 2023-01-08 MED ORDER — SODIUM CHLORIDE 0.9 % IV SOLN: Freq: Once | INTRAVENOUS | Status: AC

## 2023-01-08 NOTE — Patient Instructions (Signed)
 [facility]  Discharge Instructions: Thank you for choosing [facility] to provide your oncology and hematology care.   If you have a lab appointment with the Cancer Center, please go directly to the Cancer Center and check in at the registration area.   Wear comfortable clothing and clothing appropriate for easy access to any Portacath or PICC line.   We strive to give you quality time with your provider. You may need to reschedule your appointment if you arrive late (15 or more minutes).  Arriving late affects you and other patients whose appointments are after yours.  Also, if you miss three or more appointments without notifying the office, you may be dismissed from the clinic at the provider's discretion.      For prescription refill requests, have your pharmacy contact our office and allow 72 hours for refills to be completed.    Today you received the following chemotherapy and/or immunotherapy agents: Irinotecan/Leucovorin/5Fu      To help prevent nausea and vomiting after your treatment, we encourage you to take your nausea medication as directed.  BELOW ARE SYMPTOMS THAT SHOULD BE REPORTED IMMEDIATELY: *FEVER GREATER THAN 100.4 F (38 C) OR HIGHER *CHILLS OR SWEATING *NAUSEA AND VOMITING THAT IS NOT CONTROLLED WITH YOUR NAUSEA MEDICATION *UNUSUAL SHORTNESS OF BREATH *UNUSUAL BRUISING OR BLEEDING *URINARY PROBLEMS (pain or burning when urinating, or frequent urination) *BOWEL PROBLEMS (unusual diarrhea, constipation, pain near the anus) TENDERNESS IN MOUTH AND THROAT WITH OR WITHOUT PRESENCE OF ULCERS (sore throat, sores in mouth, or a toothache) UNUSUAL RASH, SWELLING OR PAIN  UNUSUAL VAGINAL DISCHARGE OR ITCHING   Items with * indicate a potential emergency and should be followed up as soon as possible or go to the Emergency Department if any problems should occur.  Please show the CHEMOTHERAPY ALERT CARD  or IMMUNOTHERAPY ALERT CARD at check-in to the Emergency Department and triage nurse.  Should you have questions after your visit or need to cancel or reschedule your appointment, please contact [facility] - A DEPT OF [facility]  Dept: [phone]  and follow the prompts.  Office hours are 8:00 a.m. to 4:30 p.m. Monday - Friday. Please note that voicemails left after 4:00 p.m. may not be returned until the following business day.  We are closed weekends and major holidays. You have access to a nurse at all times for urgent questions. Please call the main number to the clinic Dept: [phone] and follow the prompts.   For any non-urgent questions, you may also contact your provider using MyChart. We now offer e-Visits for anyone 51 and older to request care online for non-urgent symptoms. For details visit [email or web address].   Also download the MyChart app! Go to the app store, search "MyChart", open the app, select [facility], and log in with your MyChart username and password.

## 2023-01-08 NOTE — Progress Notes (Signed)
 Patient Care Team: Pcp, No as PCP - General Jonathan Callander, MD as Consulting Physician (Hematology and Oncology)   CHIEF COMPLAINT: Follow-up gastric cancer  Oncology History Overview Note   Cancer Staging  Gastric cancer [facility]) Staging form: Stomach, AJCC 8th Edition - Clinical stage from 10/18/2021: Stage III (cT3, cN3a, cM0) - Signed by Jonathan Callander, MD on 11/10/2021     Gastric cancer (HCC)  10/18/2021 Procedure   EGD performed under the care of Dr. Dianna  Findings:  -A large, ulcerated, partially circumferential (involving two thirds of the lumen circumference) mass with no bleeding and stigmata of recent bleeding was found in the gastric antrum, at the pylorus and in the prepyloric region of the stomach. -Segmental severe inflammation characterized by congestion (edema) and erythema was found in the gastric antrum.   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 Negative for dysplasia.  Negative for helicobacter pylori  Mismatch Repair (MMR) Protein Imunohistochemistry (IHC): IHC Expression Result: MLH1: Preserved nuclear expression. MSH2: Preserved nuclear expression. MSH6: Preserved nuclear expression. PMS2: Preserved nuclear expression. Interpretation: NORMAL   10/31/2021 Initial Diagnosis   Gastric cancer (HCC)   11/01/2021 Tumor Marker   CEA = 1,989.58 (^)   11/06/2021 Procedure   Upper EUS, Dr. Burnette  Impression: - Normal esophagus. - A small amount of food (residue) in the stomach. - Congested, friable (with contact bleeding) and ulcerated  mucosa in the prepyloric region of the stomach. - Normal examined duodenum. - There was no evidence of significant pathology in the left lobe of the liver. - Many abnormal lymph nodes (over 10) were visualized in the gastrohepatic ligament (level 18), celiac region (level 20), perigastric region, peripancreatic region and aortocaval region. - Wall thickening was seen in the prepyloric region of the stomach. The thickening appeared to be primarily within the deep mucosa (Layer 2) but extended through the muscularis propria. - Overall constellation of findings consistent with at least T3 N3 Mx (at least stage III) gastric adenocarcinoma.   11/08/2021 Imaging   EXAM: CT CHEST, ABDOMEN, AND PELVIS WITH CONTRAST  IMPRESSION: 1. Similar irregular wall thickening of the gastric antrum, consistent with patient's known primary gastric neoplasm. 2. Increased size of the upper abdominal lymph nodes, concerning for worsening nodal disease involvement. 3. Stable left adrenal nodule common nonspecific possibly a metastatic lesion or adenoma. 4. No evidence of metastatic disease in the chest. 5. Left-sided colonic diverticulosis without findings of acute diverticulitis. 6. Enlarged prostate gland. 7.  Aortic Atherosclerosis (ICD10-I70.0).   11/22/2021 - 04/26/2022 Chemotherapy   Patient is on Treatment Plan : GASTROESOPHAGEAL FLOT q14d X 4 cycles     11/27/2021 Imaging    IMPRESSION: 1. The mass within the gastric antrum is mildly decreased in size when compared with 11/08/2021. There is persistent FDG uptake associated with this mass compatible with residual tumor. 2. Persistent FDG avid lymph nodes within the gastrohepatic ligament, portacaval region, and mesentery. These are mildly decreased in size when compared with 11/08/2021. 3. Tracer avid left supraclavicular lymph node is also mildly decreased in size when compared with 11/08/2021. 4. Indeterminate left adrenal gland nodule with mild  FDG  uptake. This may represent a lipid poor adenoma. Metastatic disease not exclude. 5. Diffuse increased uptake throughout the bone marrow is favored to represent treatment related change. 6.  Aortic Atherosclerosis (ICD10-I70.0).     02/07/2022 Imaging    IMPRESSION: 1. No substantial change in hypermetabolism associated with the distal gastric lesion. Although difficult to discern on noncontrast CT imaging, the soft tissue component appears to be decreased since the prior PET-CT. 2. Interval resolution of the hypermetabolic left supraclavicular lymph node. 3. Interval marked decrease of the hypermetabolic gastrohepatic ligament lymphadenopathy. Similar decrease in size and hypermetabolism associated with index nodes identified previously in the 4. porta hepatis and hypogastric region. 5. Stable left adrenal nodule with slight hypermetabolism. Nonspecific finding could represent lipid poor adenoma or metastatic deposit. 6. No new suspicious hypermetabolic disease on today's study. 7.  Aortic Atherosclerosis (ICD10-I70.0).   07/25/2022 - 08/15/2022 Chemotherapy   Patient is on Treatment Plan : GASTROESOPHAGEAL Ramucirumab  D1, 15 + Paclitaxel  D1,8,15 q28d     08/29/2022 -  Chemotherapy   Patient is on Treatment Plan : COLORECTAL FOLFIRI q14d        CURRENT THERAPY: FOLFIRI q2 weeks, starting 08/29/22   INTERVAL HISTORY Jonathan Maynard returns for follow-up and treatment as scheduled.  Last seen by Powell Lessen, NP 12/25/22 with cycle 9.  Doing well overall, takes things day by day.  Was getting sick after port flush but better with slower flush.  Neuropathy bothering him more today with the cold weather, but otherwise stable with tingling in the hands and feet, and fingers that feel tight and swollen.  Occasionally staggers when he stands up but no fall. Takes gabapentin  3 tabs q4 hours.  Denies new or worsening pain or any other new or specific complaints.  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     Past Surgical History:  Procedure Laterality Date   BIOPSY  04/12/2021   Procedure: BIOPSY;  Surgeon: Dianna Specking, MD;  Location: [facility];  Service: Gastroenterology;;   ESOPHAGOGASTRODUODENOSCOPY N/A 04/12/2021   Procedure: ESOPHAGOGASTRODUODENOSCOPY (EGD);  Surgeon: Dianna Specking, MD;  Location: [facility];  Service: Gastroenterology;  Laterality: N/A;   ESOPHAGOGASTRODUODENOSCOPY N/A 11/06/2021   Procedure: ESOPHAGOGASTRODUODENOSCOPY (EGD);  Surgeon: Burnette Fallow, MD;  Location: [facility];  Service: Gastroenterology;  Laterality: N/A;   GASTRECTOMY N/A 05/20/2022   Procedure: OPEN PARTIAL GASTRECTOMY WITH GASTROJEJUNAL ANASTOMOSIS;  Surgeon: Stevie, Herlene Righter, MD;  Location: [facility];  Service: General;  Laterality: N/A;   HERNIA REPAIR Left    inguinal   IR IMAGING GUIDED PORT INSERTION  11/15/2021   LAPAROSCOPY N/A 05/20/2022   Procedure: LAPAROSCOPY DIAGNOSTIC;  Surgeon: Stevie Herlene Righter, MD;  Location: [facility];  Service: General;  Laterality: N/A;   UPPER ESOPHAGEAL ENDOSCOPIC ULTRASOUND (EUS) Bilateral 11/06/2021   Procedure: UPPER ESOPHAGEAL ENDOSCOPIC ULTRASOUND (EUS);  Surgeon: Burnette Fallow, MD;  Location: [facility];  Service: Gastroenterology;  Laterality: Bilateral;     Outpatient Encounter Medications as of 01/08/2023  Medication Sig   acetaminophen  (TYLENOL ) 500 MG tablet Take 1 tablet (500 mg total) by mouth 2 (two) times daily.   dexamethasone  (DECADRON ) 4 MG tablet Take 1-2  tablets (4-8 mg total) by mouth daily. Start the day after chemotherapy for 1-2 days.  Take with food.   gabapentin  (NEURONTIN ) 100 MG capsule  Take 1-2 capsules (100-200 mg total) by mouth 4 (four) times daily as needed. OK to increase to 2 or 3 capsules in a few weeks if tolerates well   ondansetron  (ZOFRAN -ODT) 4 MG disintegrating tablet Dissolve 1 tablet (4 mg total) by mouth every 6 (six) hours as needed for nausea or vomiting.   pantoprazole  (PROTONIX ) 40 MG tablet Take 1 tablet (40 mg) by mouth 2 times daily.   prochlorperazine  (COMPAZINE ) 10 MG tablet Take 1 tablet (10 mg total) by mouth every 6 (six) hours as needed for nausea or vomiting.   No facility-administered encounter medications on file as of 01/08/2023.     Today's Vitals   01/08/23 0938 01/08/23 0939  BP: 119/76   Pulse: 80   Resp: 19   Temp: 98.1 F (36.7 C)   TempSrc: Temporal   SpO2: 100%   Weight: 187 lb (84.8 kg)   Height: 5' 5 (1.651 m)   PainSc:  6    Body mass index is 31.12 kg/m.   PHYSICAL EXAM GENERAL:alert, no distress and comfortable SKIN: no rash  EYES: sclera clear NECK: without mass LYMPH:  no palpable cervical or supraclavicular lymphadenopathy  LUNGS: clear with normal breathing effort HEART: regular rate & rhythm, no lower extremity edema ABDOMEN: abdomen soft, non-tender and normal bowel sounds NEURO: alert & oriented x 3 with fluent speech, no focal motor deficits PAC without erythema    CBC    Component Value Date/Time   WBC 5.8 01/08/2023 0859   WBC 11.0 (H) 05/24/2022 0504   RBC 3.65 (L) 01/08/2023 0859   HGB 10.7 (L) 01/08/2023 0859   HCT 32.3 (L) 01/08/2023 0859   PLT 194 01/08/2023 0859   MCV 88.5 01/08/2023 0859   MCH 29.3 01/08/2023 0859   MCHC 33.1 01/08/2023 0859   RDW 19.9 (H) 01/08/2023 0859   LYMPHSABS 2.1 01/08/2023 0859   MONOABS 0.8 01/08/2023 0859   EOSABS 0.2 01/08/2023 0859   BASOSABS 0.0 01/08/2023 0859     CMP     Component Value Date/Time   NA 143 01/08/2023 0859   K 4.1 01/08/2023 0859   CL 111 01/08/2023 0859   CO2 27 01/08/2023 0859   GLUCOSE 106 (H) 01/08/2023 0859   BUN 7  01/08/2023 0859   CREATININE 0.97 01/08/2023 0859   CALCIUM  8.9 01/08/2023 0859   PROT 6.3 (L) 01/08/2023 0859   ALBUMIN  3.7 01/08/2023 0859   AST 14 (L) 01/08/2023 0859   ALT 11 01/08/2023 0859   ALKPHOS 93 01/08/2023 0859   BILITOT 0.3 01/08/2023 0859   GFRNONAA >60 01/08/2023 0859     ASSESSMENT & PLAN: Jonathan Maynard is a 53 y.o. male with    Gastric cancer, stage III cT3N3Mx, with hypermetabolic left supraclavicular node, and indeterminate adrenal nodule, MMR normal ypT3ypN3a -diagnosed 10/18/21 by EGD for 6 month f/u of gastric ulcers, showed mucosal intramucosal adenocarcinoma. MMR normal. -baseline CEA 11/01/21 significantly elevated at 1,989.58. -EUS on 11/06/21 by Dr. Burnette, staged as T3 N3 (at least stage III) -staging CT CAP 11/08/21 showed: stable gastric antrum wall thickening; increased size of upper abdominal lymph nodes; stable left adrenal nodule, 1.9 cm. -PET scan showed FDG avid lymph nodes within the gastrohepatic ligament, portacaval region, and mesentery. Tracer avid left supraclavicular lymph node and indeterminate left adrenal gland nodule with mild FDG  uptake. -he underwent [place] node biopsy on 1/17 which was negative for malignant cells, but no lymphoid tissue seen on biopsy  -repeated PET on 02/07/22 after 3 months neoadjuvant chemo showed resolved left supraclavicular lymph node, and significant improvement in regional lymph nodes and the primary tumor, stable and indeterminate left adrenal nodule.  -CT adrenal protocol showed the left adrenal nodule is likely benign adenoma, no biopsy is needed. -Completed neoadjvant chemo FLOT s/p 12 cycles 11/22/21 - 04/27/2022 -restaging PET on 05/16/22 showed significantly hypermetabolic activity at the primary gastric cancer in pylorus, and a small hypermetabolic mesenteric lymph node, no other evidence of metastasis. surgeon Dr. Stevie aware of PET findings -S/p distal gastrectomy 05/20/22, path showed ypT3N3a with  clear margins, 7/18 + LNs, and lymphovascular invasion identified. There was some treatment effect in a few lymph nodes nut not in the primary tumor -Due to rising CEA he underwent PET scan 07/07/22 which showed hypermetabolic new adenopathy in right hilum and mesentery, diffuse liver metastasis, consistent with metastatic recurrence. He understands cancer is not curable at this stage, unfortunately  -He began palliative treatment with taxol /Ramucirumab  (due to baseline neuropathy) but worsened quickly, stopped after 3 doses and he changed to FOLFIRI 08/29/22 -CEA initially increased on treatment, CT 10/06/22 showed stable to slightly improved disease; he continues FOLFIRI and CEA has improved  -Jonathan Maynard appears stable.  S/p cycle 9 FOLFIRI, tolerating well with neuropathy and nausea from port flush.  Side effects are adequately managed with supportive care, he is able to recover and function well with good performance status -Labs reviewed, CEA is pending.  I reviewed [employer] testing which revealed no new or targetable mutations -Proceed with C10 FOLFIRI today as scheduled, no dose adjustments.  He will be scheduled for restaging scan prior to next visit     PLAN: -Labs reviewed, CEA to be drawn by infusion RN -Reviewed [employer] testing which revealed no new or targetable mutations -Proceed with C10 FOLFIRI -Restage ~01/19/23, ordered -F/up and next cycle in 2 weeks   Orders Placed This Encounter  Procedures   CT CHEST ABDOMEN PELVIS W CONTRAST    Standing Status:   Future    Expected Date:   01/19/2023    Expiration Date:   01/08/2024    If indicated for the ordered procedure, I authorize the administration of contrast media per Radiology protocol:   Yes    Does the patient have a contrast media/X-ray dye allergy?:   No    Preferred imaging location?:   [facility]    If indicated for the ordered procedure, I authorize the administration of oral contrast media per Radiology protocol:   Yes    CEA (Access)-[facility] ONLY    Standing Status:   Standing    Number of Occurrences:   20    Expiration Date:   01/08/2024      All questions were answered. The patient knows to call the clinic with any problems, questions or concerns. No barriers to learning were detected. I spent 20 minutes counseling the patient face to face. The total time spent in the appointment was 30 minutes and more than 50% was on counseling, review of test results, and coordination of care.   Jonathan Gildner, NP-C 01/08/2023

## 2023-01-10 ENCOUNTER — Inpatient Hospital Stay: Payer: BC Managed Care – PPO

## 2023-01-10 VITALS — BP 129/74 | HR 69 | Temp 98.4°F | Resp 17

## 2023-01-10 DIAGNOSIS — C163 Malignant neoplasm of pyloric antrum: Secondary | ICD-10-CM

## 2023-01-10 DIAGNOSIS — Z5112 Encounter for antineoplastic immunotherapy: Secondary | ICD-10-CM | POA: Diagnosis not present

## 2023-01-10 MED ORDER — HEPARIN SOD (PORK) LOCK FLUSH 100 UNIT/ML IV SOLN
500.0000 [IU] | Freq: Once | INTRAVENOUS | Status: AC | PRN
  Administered 2023-01-10: 500 [IU]

## 2023-01-10 MED ORDER — SODIUM CHLORIDE 0.9% FLUSH
10.0000 mL | INTRAVENOUS | Status: DC | PRN
  Administered 2023-01-10: 10 mL

## 2023-01-10 NOTE — Patient Instructions (Signed)

## 2023-01-19 ENCOUNTER — Ambulatory Visit (HOSPITAL_COMMUNITY)
Admission: RE | Admit: 2023-01-19 | Discharge: 2023-01-19 | Disposition: A | Discharge status: Home / Self Care | Payer: BC Managed Care – PPO | Source: Ambulatory Visit | Attending: Nurse Practitioner | Admitting: Nurse Practitioner

## 2023-01-19 DIAGNOSIS — C163 Malignant neoplasm of pyloric antrum: Secondary | ICD-10-CM | POA: Diagnosis present

## 2023-01-19 MED ORDER — IOHEXOL 300 MG/ML  SOLN
100.0000 mL | Freq: Once | INTRAMUSCULAR | Status: AC | PRN
  Administered 2023-01-19: 100 mL via INTRAVENOUS

## 2023-01-19 NOTE — Progress Notes (Signed)
 Follow up 01/22/2023 with Dr. Mosetta Putt to review.

## 2023-01-21 MED FILL — Fosaprepitant Dimeglumine For IV Infusion 150 MG (Base Eq): INTRAVENOUS | Qty: 5 | Status: AC

## 2023-01-21 NOTE — Assessment & Plan Note (Signed)
 cT3N3Mx, with hypermetabolic left supraclavicular node, and indeterminate adrenal nodule, MMR normal ypT3ypN3a, liver and nodes recurrence in 07/2022 -diagnosed 10/18/21 by EGD for 6 month f/u of gastric ulcers, showed mucosal intramucosal adenocarcinoma. MMR normal. -baseline CEA 11/01/21 significantly elevated at 1,989.58. -EUS on 11/06/21 by Dr. Kimble Pennant, staged as T3 N3 (at least stage III) -staging CT CAP 11/08/21 showed: stable gastric antrum wall thickening; increased size of upper abdominal lymph nodes; stable left adrenal nodule, 1.9 cm. -PET scan showed FDG avid lymph nodes within the gastrohepatic ligament, portacaval region, and mesentery. Tracer avid left supraclavicular lymph node and indeterminate left adrenal gland nodule with mild FDG uptake. -he underwent [place] node biopsy on 1/17 which was negative for malignant cells, but no lymphoid tissue seen on biopsy  -repeated PET on 02/07/22 after 3 months neoadjuvant chemo showed resolved left supraclavicular lymph node, and significant improvement in regional lymph nodes and the primary tumor, stable and indeterminate left adrenal nodule.  -CT adrenal protocol showed the left adrenal nodule is likely benign adenoma, no biopsy is needed. -Completed neoadjvant chemo FLOT s/p 12 cycles 11/22/21 - 04/27/2022 -restaging PET on 05/16/22 showed significantly hypermetabolic activity at the primary gastric cancer in pylorus, and a small hypermetabolic mesenteric lymph node, no other evidence of metastasis. surgeon Dr. Dorrie Gaudier aware of PET findings -S/p distal gastrectomy 05/20/22, path showed ypT3N3a with clear margins, 7/18 + LNs, and lymphovascular invasion identified. There was some treatment effect in a few lymph nodes nut not in the primary tumor -due to rising tumor marker, he underwent PET scan on July 07, 2022, which unfortunately showed hypermetabolic new adenopathy in right hilar and mesentery, diffuse liver metastasis, consistent with metastatic  recurrence.  I personally reviewed the PET scan images with patient -Unfortunately his disease is not curable at this stage. -The goal of chemotherapy is palliative, to prolong his life -I discussed his next generation sequencing [employer] one result, which showed EGFR amplification, no other targetable mutations.  His tumor was previously tested for HER2 which was negative (0-1+).  PD-L1 CPS score was 2%, no significant benefit of immunotherapy.  -He started paclitaxel  and ramucirumab  on 07/25/2022  He developed worsening neuropathy quickly, and I had to switch his treatment to FOLFIRI on 08/29/2022, every 2 weeks, he is overall tolerating well. -restaging CT 01/19/2023 showed disease progression with new pathologic right paratracheal mediastinal lymph nodes as well as increasing size of several liver metastases.

## 2023-01-22 ENCOUNTER — Inpatient Hospital Stay: Payer: BC Managed Care – PPO

## 2023-01-22 ENCOUNTER — Encounter: Payer: Self-pay | Admitting: Hematology

## 2023-01-22 ENCOUNTER — Inpatient Hospital Stay: Payer: BC Managed Care – PPO | Admitting: Hematology

## 2023-01-22 VITALS — BP 121/69 | HR 64 | Temp 98.7°F | Resp 16 | Ht 65.0 in | Wt 185.0 lb

## 2023-01-22 DIAGNOSIS — C163 Malignant neoplasm of pyloric antrum: Secondary | ICD-10-CM

## 2023-01-22 DIAGNOSIS — Z95828 Presence of other vascular implants and grafts: Secondary | ICD-10-CM

## 2023-01-22 DIAGNOSIS — Z5112 Encounter for antineoplastic immunotherapy: Secondary | ICD-10-CM | POA: Diagnosis not present

## 2023-01-22 LAB — CBC WITH DIFFERENTIAL (CANCER CENTER ONLY)
Abs Immature Granulocytes: 0.02 10*3/uL (ref 0.00–0.07)
Basophils Absolute: 0.1 10*3/uL (ref 0.0–0.1)
Basophils Relative: 1 %
Eosinophils Absolute: 0.2 10*3/uL (ref 0.0–0.5)
Eosinophils Relative: 3 %
HCT: 32.6 % — ABNORMAL LOW (ref 39.0–52.0)
Hemoglobin: 10.5 g/dL — ABNORMAL LOW (ref 13.0–17.0)
Immature Granulocytes: 0 %
Lymphocytes Relative: 38 %
Lymphs Abs: 1.8 10*3/uL (ref 0.7–4.0)
MCH: 28.3 pg (ref 26.0–34.0)
MCHC: 32.2 g/dL (ref 30.0–36.0)
MCV: 87.9 fL (ref 80.0–100.0)
Monocytes Absolute: 0.5 10*3/uL (ref 0.1–1.0)
Monocytes Relative: 11 %
Neutro Abs: 2.3 10*3/uL (ref 1.7–7.7)
Neutrophils Relative %: 47 %
Platelet Count: 234 10*3/uL (ref 150–400)
RBC: 3.71 MIL/uL — ABNORMAL LOW (ref 4.22–5.81)
RDW: 19.4 % — ABNORMAL HIGH (ref 11.5–15.5)
WBC Count: 4.8 10*3/uL (ref 4.0–10.5)
nRBC: 0 % (ref 0.0–0.2)

## 2023-01-22 LAB — CMP (CANCER CENTER ONLY)
ALT: 9 U/L (ref 0–44)
AST: 14 U/L — ABNORMAL LOW (ref 15–41)
Albumin: 3.8 g/dL (ref 3.5–5.0)
Alkaline Phosphatase: 91 U/L (ref 38–126)
Anion gap: 5 (ref 5–15)
BUN: 9 mg/dL (ref 6–20)
CO2: 28 mmol/L (ref 22–32)
Calcium: 9 mg/dL (ref 8.9–10.3)
Chloride: 109 mmol/L (ref 98–111)
Creatinine: 0.85 mg/dL (ref 0.61–1.24)
GFR, Estimated: >60 mL/min (ref >60)
Glucose, Bld: 129 mg/dL — ABNORMAL HIGH (ref 70–99)
Potassium: 3.8 mmol/L (ref 3.5–5.1)
Sodium: 142 mmol/L (ref 135–145)
Total Bilirubin: 0.3 mg/dL (ref 0.0–1.2)
Total Protein: 6.3 g/dL — ABNORMAL LOW (ref 6.5–8.1)

## 2023-01-22 LAB — CEA (ACCESS): CEA (CHCC): 468.36 ng/mL — ABNORMAL HIGH (ref 0.00–5.00)

## 2023-01-22 MED ORDER — HEPARIN SOD (PORK) LOCK FLUSH 100 UNIT/ML IV SOLN: 500.0000 [IU] | Freq: Once | INTRAVENOUS | Status: DC | PRN

## 2023-01-22 MED ORDER — SODIUM CHLORIDE 0.9 % IV SOLN: Freq: Once | INTRAVENOUS | Status: AC

## 2023-01-22 MED ORDER — DEXAMETHASONE SODIUM PHOSPHATE 10 MG/ML IJ SOLN
10.0000 mg | Freq: Once | INTRAMUSCULAR | Status: AC
Start: 2023-01-22 — End: 2023-01-22
  Administered 2023-01-22: 10 mg via INTRAVENOUS
  Filled 2023-01-22: qty 1

## 2023-01-22 MED ORDER — FOSAPREPITANT DIMEGLUMINE INJECTION 150 MG
150.0000 mg | Freq: Once | INTRAVENOUS | Status: AC
  Administered 2023-01-22: 150 mg via INTRAVENOUS
  Filled 2023-01-22: qty 150

## 2023-01-22 MED ORDER — SODIUM CHLORIDE 0.9% FLUSH: 10.0000 mL | INTRAVENOUS | Status: DC | PRN

## 2023-01-22 MED ORDER — PALONOSETRON HCL INJECTION 0.25 MG/5ML
0.2500 mg | Freq: Once | INTRAVENOUS | Status: AC
  Administered 2023-01-22: 0.25 mg via INTRAVENOUS
  Filled 2023-01-22: qty 5

## 2023-01-22 MED ORDER — IRINOTECAN HCL CHEMO INJECTION 100 MG/5ML
180.0000 mg/m2 | Freq: Once | INTRAVENOUS | Status: AC
  Administered 2023-01-22: 340 mg via INTRAVENOUS
  Filled 2023-01-22: qty 15

## 2023-01-22 MED ORDER — SODIUM CHLORIDE 0.9% FLUSH
10.0000 mL | Freq: Once | INTRAVENOUS | Status: AC
  Administered 2023-01-22: 10 mL

## 2023-01-22 MED ORDER — LEUCOVORIN CALCIUM INJECTION 350 MG
400.0000 mg/m2 | Freq: Once | INTRAMUSCULAR | Status: AC
  Administered 2023-01-22: 760 mg via INTRAVENOUS
  Filled 2023-01-22: qty 17.5

## 2023-01-22 MED ORDER — SODIUM CHLORIDE 0.9 % IV SOLN
2400.0000 mg/m2 | INTRAVENOUS | Status: DC
  Administered 2023-01-22: 5000 mg via INTRAVENOUS
  Filled 2023-01-22: qty 100

## 2023-01-22 NOTE — Patient Instructions (Signed)
 [facility]  Discharge Instructions: Thank you for choosing [facility] to provide your oncology and hematology care.   If you have a lab appointment with the Cancer Center, please go directly to the Cancer Center and check in at the registration area.   Wear comfortable clothing and clothing appropriate for easy access to any Portacath or PICC line.   We strive to give you quality time with your provider. You may need to reschedule your appointment if you arrive late (15 or more minutes).  Arriving late affects you and other patients whose appointments are after yours.  Also, if you miss three or more appointments without notifying the office, you may be dismissed from the clinic at the provider's discretion.      For prescription refill requests, have your pharmacy contact our office and allow 72 hours for refills to be completed.    Today you received the following chemotherapy and/or immunotherapy agents: Irinotecan/Leucovorin/5Fu      To help prevent nausea and vomiting after your treatment, we encourage you to take your nausea medication as directed.  BELOW ARE SYMPTOMS THAT SHOULD BE REPORTED IMMEDIATELY: *FEVER GREATER THAN 100.4 F (38 C) OR HIGHER *CHILLS OR SWEATING *NAUSEA AND VOMITING THAT IS NOT CONTROLLED WITH YOUR NAUSEA MEDICATION *UNUSUAL SHORTNESS OF BREATH *UNUSUAL BRUISING OR BLEEDING *URINARY PROBLEMS (pain or burning when urinating, or frequent urination) *BOWEL PROBLEMS (unusual diarrhea, constipation, pain near the anus) TENDERNESS IN MOUTH AND THROAT WITH OR WITHOUT PRESENCE OF ULCERS (sore throat, sores in mouth, or a toothache) UNUSUAL RASH, SWELLING OR PAIN  UNUSUAL VAGINAL DISCHARGE OR ITCHING   Items with * indicate a potential emergency and should be followed up as soon as possible or go to the Emergency Department if any problems should occur.  Please show the CHEMOTHERAPY ALERT CARD  or IMMUNOTHERAPY ALERT CARD at check-in to the Emergency Department and triage nurse.  Should you have questions after your visit or need to cancel or reschedule your appointment, please contact [facility] - A DEPT OF [facility]  Dept: [phone]  and follow the prompts.  Office hours are 8:00 a.m. to 4:30 p.m. Monday - Friday. Please note that voicemails left after 4:00 p.m. may not be returned until the following business day.  We are closed weekends and major holidays. You have access to a nurse at all times for urgent questions. Please call the main number to the clinic Dept: [phone] and follow the prompts.   For any non-urgent questions, you may also contact your provider using MyChart. We now offer e-Visits for anyone 51 and older to request care online for non-urgent symptoms. For details visit [email or web address].   Also download the MyChart app! Go to the app store, search "MyChart", open the app, select [facility], and log in with your MyChart username and password.

## 2023-01-22 NOTE — Progress Notes (Signed)
[facility]   Telephone:([phone] Fax:(336) [phone]   Clinic Follow up Note   Patient Care Team: Pcp, No as PCP - General Malachy Mood, MD as Consulting Physician (Hematology and Oncology)  Date of Service:  01/22/2023  CHIEF COMPLAINT: f/u of metastatic gastric cancer  CURRENT THERAPY:  FOLFIRI  Oncology History   Gastric cancer (HCC) cT3N3Mx, with hypermetabolic left supraclavicular node, and indeterminate adrenal nodule, MMR normal ypT3ypN3a, liver and nodes recurrence in 07/2022 -diagnosed 10/18/21 by EGD for 6 month f/u of gastric ulcers, showed mucosal intramucosal adenocarcinoma. MMR normal. -baseline CEA 11/01/21 significantly elevated at 1,989.58. -EUS on 11/06/21 by Dr. Dulce Sellar, staged as T3 N3 (at least stage III) -staging CT CAP 11/08/21 showed: stable gastric antrum wall thickening; increased size of upper abdominal lymph nodes; stable left adrenal nodule, 1.9 cm. -PET scan showed FDG avid lymph nodes within the gastrohepatic ligament, portacaval region, and mesentery. Tracer avid left supraclavicular lymph node and indeterminate left adrenal gland nodule with mild FDG uptake. -he underwent  node biopsy on 1/17 which was negative for malignant cells, but no lymphoid tissue seen on biopsy  -repeated PET on 02/07/22 after 3 months neoadjuvant chemo showed resolved left supraclavicular lymph node, and significant improvement in regional lymph nodes and the primary tumor, stable and indeterminate left adrenal nodule.  -CT adrenal protocol showed the left adrenal nodule is likely benign adenoma, no biopsy is needed. -Completed neoadjvant chemo FLOT s/p 12 cycles 11/22/21 - 04/27/2022 -restaging PET on 05/16/22 showed significantly hypermetabolic activity at the primary gastric cancer in pylorus, and a small hypermetabolic mesenteric lymph node, no other evidence of metastasis. surgeon Dr. Sheliah Hatch aware of PET findings -S/p distal gastrectomy 05/20/22, path showed  ypT3N3a with clear margins, 7/18 + LNs, and lymphovascular invasion identified. There was some treatment effect in a few lymph nodes nut not in the primary tumor -due to rising tumor marker, he underwent PET scan on July 07, 2022, which unfortunately showed hypermetabolic new adenopathy in right hilar and mesentery, diffuse liver metastasis, consistent with metastatic recurrence.  I personally reviewed the PET scan images with patient -Unfortunately his disease is not curable at this stage. -The goal of chemotherapy is palliative, to prolong his life -I discussed his next generation sequencing foundation one result, which showed EGFR amplification, no other targetable mutations.  His tumor was previously tested for HER2 which was negative (0-1+).  PD-L1 CPS score was 2%, no significant benefit of immunotherapy.  -He started paclitaxel and ramucirumab on 07/25/2022  He developed worsening neuropathy quickly, and I had to switch his treatment to FOLFIRI on 08/29/2022, every 2 weeks, he is overall tolerating well. -restaging CT 01/19/2023 showed disease progression with new pathologic right paratracheal mediastinal lymph nodes as well as increasing size of several liver metastases.   Assessment and Plan    Metastatic Gastric Cancer   Recent CT scan shows slight progression with increased size of liver metastases and new enlarged mediastinal lymph nodes. Current chemotherapy regimen is inadequate. Discussed new chemotherapy options: Taxol, docetaxel, Folfax, or adding nivolumab. Risks include neuropathy, cold sensitivity, and limited efficacy of Lonsurf. Patient prefers lower neuropathy risk and is willing to try ice therapy. Nivolumab considered due to 2% PD-L1 expression, despite limited benefit.   - Proceed with current chemotherapy regimen today   - Switch to new chemotherapy regimen in two weeks: Taxol, ramucirumab and nivolumab every 2 weeks (patient does not want to do Taxol weekly), will see if his  insurance to approve Abraxane -  Use ice on hands and feet during chemotherapy to reduce neuropathy   - Check insurance approval for Abraxane   - Monitor blood counts, kidney, and liver function   - Schedule treatments on Friday afternoons    Chemotherapy-Induced Neuropathy   Reports numbness and tingling in hands and feet, managed with gabapentin and lotion. Neuropathy affects balance but does not cause falls. Worsened with previous Taxol treatment. Discussed using ice therapy to mitigate neuropathy during new chemotherapy regimen.   - Continue gabapentin   - Use ice on hands and feet during chemotherapy to reduce neuropathy    General Health Maintenance   Reports good appetite and weight gain. Sleep pattern is disrupted with frequent nighttime eating. Bowel movements are normal.   - Monitor sleep pattern and consider interventions if sleep disruption continues     PLAN -Lab and CT scan reviewed, monitor progression -Will proceed last cycle FOLFIRI today, and change treatment to Abraxane, ramucirumab and nivolumab in 2 weeks - Schedule follow-up appointment in two weeks   - Provide a letter for his job to explain the new treatment schedule.       SUMMARY OF ONCOLOGIC HISTORY: Oncology History Overview Note   Cancer Staging  Gastric cancer [facility]) Staging form: Stomach, AJCC 8th Edition - Clinical stage from 10/18/2021: Stage III (cT3, cN3a, cM0) - Signed by Malachy Mood, MD on 11/10/2021     Gastric cancer (HCC)  10/18/2021 Procedure   EGD performed under the care of Dr. Bosie Clos  Findings:  -A large, ulcerated, partially circumferential (involving two thirds of the lumen circumference) mass with no bleeding and stigmata of recent bleeding was found in the gastric antrum, at the pylorus and in the prepyloric region of the stomach. -Segmental severe inflammation characterized by congestion (edema) and erythema was found in the gastric antrum.   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 Negative for dysplasia.  Negative for helicobacter pylori  Mismatch Repair (MMR) Protein Imunohistochemistry (IHC): IHC Expression Result: MLH1: Preserved nuclear expression. MSH2: Preserved nuclear expression. MSH6: Preserved nuclear expression. PMS2: Preserved nuclear expression. Interpretation: NORMAL   10/31/2021 Initial Diagnosis   Gastric cancer (HCC)   11/01/2021 Tumor Marker   CEA = 1,610.96 (^)   11/06/2021 Procedure   Upper EUS, Dr. Dulce Sellar  Impression: - Normal esophagus. - A small amount of food (residue) in the stomach. - Congested, friable (with contact bleeding) and ulcerated mucosa in the prepyloric region of the stomach. - Normal examined duodenum. - There was no evidence of significant pathology in the left lobe of the liver. - Many abnormal lymph nodes (over 10) were visualized in the gastrohepatic ligament (level 18), celiac region (level 20), perigastric region, peripancreatic region and aortocaval region. - Wall thickening was seen in the prepyloric region of the stomach. The thickening appeared to be primarily within the deep mucosa (Layer 2) but extended through the muscularis propria. - Overall constellation of findings consistent with at least T3 N3 Mx (at least stage III) gastric adenocarcinoma.   11/08/2021 Imaging   EXAM: CT CHEST, ABDOMEN, AND PELVIS WITH CONTRAST  IMPRESSION: 1. Similar irregular wall thickening of the gastric antrum,  consistent with patient's known primary gastric neoplasm. 2. Increased size of the upper  abdominal lymph nodes, concerning for worsening nodal disease involvement. 3. Stable left adrenal nodule common nonspecific possibly a metastatic lesion or adenoma. 4. No evidence of metastatic disease in the chest. 5. Left-sided colonic diverticulosis without findings of acute diverticulitis. 6. Enlarged prostate gland. 7.  Aortic Atherosclerosis (ICD10-I70.0).   11/22/2021 - 04/26/2022 Chemotherapy   Patient is on Treatment Plan : GASTROESOPHAGEAL FLOT q14d X 4 cycles     11/27/2021 Imaging    IMPRESSION: 1. The mass within the gastric antrum is mildly decreased in size when compared with 11/08/2021. There is persistent FDG uptake associated with this mass compatible with residual tumor. 2. Persistent FDG avid lymph nodes within the gastrohepatic ligament, portacaval region, and mesentery. These are mildly decreased in size when compared with 11/08/2021. 3. Tracer avid left supraclavicular lymph node is also mildly decreased in size when compared with 11/08/2021. 4. Indeterminate left adrenal gland nodule with mild FDG uptake. This may represent a lipid poor adenoma. Metastatic disease not exclude. 5. Diffuse increased uptake throughout the bone marrow is favored to represent treatment related change. 6.  Aortic Atherosclerosis (ICD10-I70.0).     02/07/2022 Imaging    IMPRESSION: 1. No substantial change in hypermetabolism associated with the distal gastric lesion. Although difficult to discern on noncontrast CT imaging, the soft tissue component appears to be decreased since the prior PET-CT. 2. Interval resolution of the hypermetabolic left supraclavicular lymph node. 3. Interval marked decrease of the hypermetabolic gastrohepatic ligament lymphadenopathy. Similar decrease in size and hypermetabolism associated with index nodes identified previously in the 4. porta hepatis and hypogastric region. 5. Stable left adrenal nodule with slight hypermetabolism. Nonspecific  finding could represent lipid poor adenoma or metastatic deposit. 6. No new suspicious hypermetabolic disease on today's study. 7.  Aortic Atherosclerosis (ICD10-I70.0).   07/25/2022 - 08/15/2022 Chemotherapy   Patient is on Treatment Plan : GASTROESOPHAGEAL Ramucirumab D1, 15 + Paclitaxel D1,8,15 q28d     08/29/2022 -  Chemotherapy   Patient is on Treatment Plan : COLORECTAL FOLFIRI q14d        Discussed the use of AI scribe software for clinical note transcription with the patient, who gave verbal consent to proceed.  History of Present Illness   The patient, a 53 year old gentleman with metastatic gastric cancer, presents for a follow-up visit. He reports experiencing stomach pain, rated as a 5 on a scale of 1 to 10, after receiving saline to clean the chemotherapy pump. The pain lasted for two days and then resolved. The patient also reports numbness and tingling, which he manages with lotion and Gabapentin. He experiences some loss of balance but has not fallen. The patient's sleep pattern is irregular, often waking up at night to eat. Despite these symptoms, the patient reports that he is eating well and has gained weight.         All other systems were reviewed with the patient and are negative.  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    SURGICAL HISTORY: Past Surgical History:  Procedure Laterality Date   BIOPSY  04/12/2021   Procedure: BIOPSY;  Surgeon: Charlott Rakes, MD;  Location: [facility];  Service: Gastroenterology;;   ESOPHAGOGASTRODUODENOSCOPY N/A 04/12/2021   Procedure: ESOPHAGOGASTRODUODENOSCOPY (EGD);  Surgeon: Charlott Rakes, MD;  Location: [facility];  Service:  Gastroenterology;  Laterality: N/A;   ESOPHAGOGASTRODUODENOSCOPY N/A 11/06/2021   Procedure: ESOPHAGOGASTRODUODENOSCOPY (EGD);  Surgeon: Willis Modena, MD;  Location: [facility];  Service: Gastroenterology;  Laterality: N/A;   GASTRECTOMY N/A 05/20/2022   Procedure: OPEN PARTIAL GASTRECTOMY WITH GASTROJEJUNAL ANASTOMOSIS;  Surgeon: Sheliah Hatch, De Blanch, MD;  Location: [facility];  Service: General;  Laterality: N/A;   HERNIA REPAIR Left    inguinal   IR IMAGING GUIDED PORT INSERTION  11/15/2021   LAPAROSCOPY N/A 05/20/2022   Procedure: LAPAROSCOPY DIAGNOSTIC;  Surgeon: Rodman Pickle, MD;  Location: [facility];  Service: General;  Laterality: N/A;   UPPER ESOPHAGEAL ENDOSCOPIC ULTRASOUND (EUS) Bilateral 11/06/2021   Procedure: UPPER ESOPHAGEAL ENDOSCOPIC ULTRASOUND (EUS);  Surgeon: Willis Modena, MD;  Location: [facility];  Service: Gastroenterology;  Laterality: Bilateral;    I have reviewed the social history and family history with the patient and they are unchanged from previous note.  ALLERGIES:  has no known allergies.  MEDICATIONS:  Current Outpatient Medications  Medication Sig Dispense Refill   acetaminophen (TYLENOL) 500 MG tablet Take 1 tablet (500 mg total) by mouth 2 (two) times daily. 60 tablet 3   dexamethasone (DECADRON) 4 MG tablet Take 1-2 tablets (4-8 mg total) by mouth daily. Start the day after chemotherapy for 1-2 days.  Take with food. 20 tablet 1   gabapentin (NEURONTIN) 100 MG capsule Take 1-2 capsules (100-200 mg total) by mouth 4 (four) times daily as needed. OK to increase to 2 or 3 capsules in a few weeks if tolerates well 240 capsule 2   ondansetron (ZOFRAN-ODT) 4 MG disintegrating tablet Dissolve 1 tablet (4 mg total) by mouth every 6 (six) hours as needed for nausea or vomiting. 30 tablet 1   pantoprazole (PROTONIX) 40 MG tablet Take 1 tablet (40 mg) by mouth 2 times daily. 60 tablet 6   prochlorperazine (COMPAZINE) 10 MG tablet Take 1 tablet (10 mg  total) by mouth every 6 (six) hours as needed for nausea or vomiting. 30 tablet 2   No current facility-administered medications for this visit.   Facility-Administered Medications Ordered in Other Visits  Medication Dose Route Frequency Provider Last Rate Last Admin   fluorouracil (ADRUCIL) 5,000 mg in sodium chloride 0.9 % 150 mL chemo infusion  2,400 mg/m2 (Treatment Plan Recorded) Intravenous 1 day or 1 dose Malachy Mood, MD       heparin lock flush 100 unit/mL  500 Units Intracatheter Once PRN Malachy Mood, MD       irinotecan (CAMPTOSAR) 340 mg in sodium chloride 0.9 % 500 mL chemo infusion  180 mg/m2 (Treatment Plan Recorded) Intravenous Once Malachy Mood, MD       leucovorin 760 mg in sodium chloride 0.9 % 250 mL infusion  400 mg/m2 (Treatment Plan Recorded) Intravenous Once Malachy Mood, MD       sodium chloride flush (NS) 0.9 % injection 10 mL  10 mL Intracatheter PRN Malachy Mood, MD        PHYSICAL EXAMINATION: ECOG PERFORMANCE STATUS: 1 - Symptomatic but completely ambulatory  Vitals:   01/22/23 0829  BP: 121/69  Pulse: 64  Resp: 16  Temp: 98.7 F (37.1 C)  SpO2: 100%   Wt Readings from Last 3 Encounters:  01/22/23 185 lb (83.9 kg)  01/08/23 187 lb (84.8 kg)  01/04/23 183 lb (83 kg)  GENERAL:alert, no distress and comfortable SKIN: skin color, texture, turgor are normal, no rashes or significant lesions EYES: normal, Conjunctiva are pink and non-injected, sclera clear NECK: supple, thyroid normal size, non-tender, without nodularity LYMPH:  no palpable lymphadenopathy in the cervical, axillary  LUNGS: clear to auscultation and percussion with normal breathing effort HEART: regular rate & rhythm and no murmurs and no lower extremity edema ABDOMEN:abdomen soft, non-tender and normal bowel sounds Musculoskeletal:no cyanosis of digits and no clubbing  NEURO: alert & oriented x 3 with fluent speech, no focal motor/sensory deficits   LABORATORY DATA:  I have reviewed the data  as listed    Latest Ref Rng & Units 01/22/2023    8:09 AM 01/08/2023    8:59 AM 12/25/2022   12:45 PM  CBC  WBC 4.0 - 10.5 K/uL 4.8  5.8  6.3   Hemoglobin 13.0 - 17.0 g/dL 96.0  45.4  09.8   Hematocrit 39.0 - 52.0 % 32.6  32.3  30.9   Platelets 150 - 400 K/uL 234  194  238         Latest Ref Rng & Units 01/22/2023    8:09 AM 01/08/2023    8:59 AM 12/25/2022   12:45 PM  CMP  Glucose 70 - 99 mg/dL 119  147  829   BUN 6 - 20 mg/dL 9  7  11    Creatinine 0.61 - 1.24 mg/dL 5.62  1.30  8.65   Sodium 135 - 145 mmol/L 142  143  141   Potassium 3.5 - 5.1 mmol/L 3.8  4.1  4.3   Chloride 98 - 111 mmol/L 109  111  108   CO2 22 - 32 mmol/L 28  27  28    Calcium 8.9 - 10.3 mg/dL 9.0  8.9  8.9   Total Protein 6.5 - 8.1 g/dL 6.3  6.3  6.0   Total Bilirubin 0.0 - 1.2 mg/dL 0.3  0.3  0.3   Alkaline Phos 38 - 126 U/L 91  93  82   AST 15 - 41 U/L 14  14  13    ALT 0 - 44 U/L 9  11  10        RADIOGRAPHIC STUDIES: I have personally reviewed the radiological images as listed and agreed with the findings in the report. No results found.    No orders of the defined types were placed in this encounter.  All questions were answered. The patient knows to call the clinic with any problems, questions or concerns. No barriers to learning was detected. The total time spent in the appointment was 40 minutes.     Malachy Mood, MD 01/22/2023

## 2023-01-23 ENCOUNTER — Encounter: Payer: Self-pay | Admitting: Hematology

## 2023-01-24 ENCOUNTER — Inpatient Hospital Stay: Payer: BC Managed Care – PPO

## 2023-01-24 VITALS — BP 133/75 | HR 69 | Temp 99.0°F | Resp 17

## 2023-01-24 DIAGNOSIS — Z5112 Encounter for antineoplastic immunotherapy: Secondary | ICD-10-CM | POA: Diagnosis not present

## 2023-01-24 DIAGNOSIS — C163 Malignant neoplasm of pyloric antrum: Secondary | ICD-10-CM

## 2023-01-24 MED ORDER — HEPARIN SOD (PORK) LOCK FLUSH 100 UNIT/ML IV SOLN
500.0000 [IU] | Freq: Once | INTRAVENOUS | Status: AC | PRN
  Administered 2023-01-24: 500 [IU]

## 2023-01-24 MED ORDER — SODIUM CHLORIDE 0.9% FLUSH
10.0000 mL | INTRAVENOUS | Status: DC | PRN
Start: 2023-01-24 — End: 2023-01-24
  Administered 2023-01-24: 10 mL

## 2023-01-26 ENCOUNTER — Encounter: Payer: Self-pay | Admitting: Hematology

## 2023-01-26 NOTE — Addendum Note (Signed)
Addended by: Malachy Mood on: 01/26/2023 10:17 AM   Modules accepted: Orders

## 2023-01-26 NOTE — Progress Notes (Signed)
DISCONTINUE OFF PATHWAY REGIMEN - Gastroesophageal   [id number]:FOLFIRI (Leucovorin IV D1 + Fluorouracil IV D1/CIV D1,2 + Irinotecan IV D1) q14 Days:   A cycle is every 14 days:     Irinotecan      Leucovorin      Fluorouracil      Fluorouracil   **Always confirm dose/schedule in your pharmacy ordering system**  PRIOR TREATMENT: Off Pathway: FOLFIRI (Leucovorin IV D1 + Fluorouracil IV D1/CIV D1,2 + Irinotecan IV D1) q14 Days  START OFF PATHWAY REGIMEN - Gastroesophageal   [id number]:Paclitaxel 80 mg/m2 IV D1,8,15 + Ramucirumab 8 mg/kg IV D1,15 q28 Days:   A cycle is every 28 days:     Ramucirumab      Paclitaxel   **Always confirm dose/schedule in your pharmacy ordering system**  Patient Characteristics: Distant Metastases (cM1/pM1) / Locally Recurrent Disease, Adenocarcinoma - Esophageal, GE Junction, and Gastric, Second Line, MSS/pMMR or MSI Unknown Therapeutic Status: Distant Metastases (No Additional Staging) Histology: Adenocarcinoma Disease Classification: Gastric Line of Therapy: Second Line Microsatellite/Mismatch Repair Status: MSS/pMMR Intent of Therapy: Non-Curative / Palliative Intent, Discussed with Patient

## 2023-01-27 ENCOUNTER — Other Ambulatory Visit: Payer: Self-pay

## 2023-02-02 ENCOUNTER — Telehealth: Payer: Self-pay

## 2023-02-02 NOTE — Telephone Encounter (Signed)
Notified patient that his [employer] FMLA documents had been completed and faxed back to company. Fax confirmation received. Copy of documents placed upfront for pick up at next visit. No further concerns at this time.

## 2023-02-04 NOTE — Assessment & Plan Note (Addendum)
cT3N3Mx, with hypermetabolic left supraclavicular node, and indeterminate adrenal nodule, MMR normal ypT3ypN3a, liver and nodes recurrence in 07/2022 -diagnosed 10/18/21 by EGD for 6 month f/u of gastric ulcers, showed mucosal intramucosal adenocarcinoma. MMR normal. -baseline CEA 11/01/21 significantly elevated at 1,989.58. -EUS on 11/06/21 by Dr. Dulce Sellar, staged as T3 N3 (at least stage III) -staging CT CAP 11/08/21 showed: stable gastric antrum wall thickening; increased size of upper abdominal lymph nodes; stable left adrenal nodule, 1.9 cm. -PET scan showed FDG avid lymph nodes within the gastrohepatic ligament, portacaval region, and mesentery. Tracer avid left supraclavicular lymph node and indeterminate left adrenal gland nodule with mild FDG uptake. -he underwent [place] node biopsy on 1/17 which was negative for malignant cells, but no lymphoid tissue seen on biopsy  -repeated PET on 02/07/22 after 3 months neoadjuvant chemo showed resolved left supraclavicular lymph node, and significant improvement in regional lymph nodes and the primary tumor, stable and indeterminate left adrenal nodule.  -CT adrenal protocol showed the left adrenal nodule is likely benign adenoma, no biopsy is needed. -Completed neoadjvant chemo FLOT s/p 12 cycles 11/22/21 - 04/27/2022 -restaging PET on 05/16/22 showed significantly hypermetabolic activity at the primary gastric cancer in pylorus, and a small hypermetabolic mesenteric lymph node, no other evidence of metastasis. surgeon Dr. Sheliah Hatch aware of PET findings -S/p distal gastrectomy 05/20/22, path showed ypT3N3a with clear margins, 7/18 + LNs, and lymphovascular invasion identified. There was some treatment effect in a few lymph nodes nut not in the primary tumor -due to rising tumor marker, he underwent PET scan on July 07, 2022, which unfortunately showed hypermetabolic new adenopathy in right hilar and mesentery, diffuse liver metastasis, consistent with metastatic  recurrence.  I personally reviewed the PET scan images with patient -Unfortunately his disease is not curable at this stage. -The goal of chemotherapy is palliative, to prolong his life -I discussed his next generation sequencing foundation one result, which showed EGFR amplification, no other targetable mutations.  His tumor was previously tested for HER2 which was negative (0-1+).  PD-L1 CPS score was 2%, no significant benefit of immunotherapy.  -He started paclitaxel and ramucirumab on 07/25/2022  He developed worsening neuropathy quickly, and I had to switch his treatment to FOLFIRI on 08/29/2022, every 2 weeks, he is overall tolerating well. -restaging CT 01/19/2023 showed disease progression with new pathologic right paratracheal mediastinal lymph nodes as well as increasing size of several liver metastases. -will change his chemo to taxol and ramucizumab, every 2 weeks per his request

## 2023-02-05 ENCOUNTER — Inpatient Hospital Stay: Payer: BC Managed Care – PPO | Admitting: Hematology

## 2023-02-05 ENCOUNTER — Inpatient Hospital Stay: Payer: BC Managed Care – PPO

## 2023-02-05 VITALS — BP 138/74 | HR 72 | Temp 98.7°F | Resp 16 | Wt 185.1 lb

## 2023-02-05 DIAGNOSIS — C163 Malignant neoplasm of pyloric antrum: Secondary | ICD-10-CM

## 2023-02-05 DIAGNOSIS — Z95828 Presence of other vascular implants and grafts: Secondary | ICD-10-CM

## 2023-02-05 DIAGNOSIS — T451X5A Adverse effect of antineoplastic and immunosuppressive drugs, initial encounter: Secondary | ICD-10-CM | POA: Diagnosis not present

## 2023-02-05 DIAGNOSIS — Z5112 Encounter for antineoplastic immunotherapy: Secondary | ICD-10-CM | POA: Diagnosis not present

## 2023-02-05 DIAGNOSIS — G620 Drug-induced polyneuropathy: Secondary | ICD-10-CM

## 2023-02-05 LAB — CBC WITH DIFFERENTIAL (CANCER CENTER ONLY)
Abs Immature Granulocytes: 0.02 10*3/uL (ref 0.00–0.07)
Basophils Absolute: 0 10*3/uL (ref 0.0–0.1)
Basophils Relative: 1 %
Eosinophils Absolute: 0.1 10*3/uL (ref 0.0–0.5)
Eosinophils Relative: 2 %
HCT: 34.5 % — ABNORMAL LOW (ref 39.0–52.0)
Hemoglobin: 11 g/dL — ABNORMAL LOW (ref 13.0–17.0)
Immature Granulocytes: 0 %
Lymphocytes Relative: 33 %
Lymphs Abs: 2 10*3/uL (ref 0.7–4.0)
MCH: 28.3 pg (ref 26.0–34.0)
MCHC: 31.9 g/dL (ref 30.0–36.0)
MCV: 88.7 fL (ref 80.0–100.0)
Monocytes Absolute: 0.5 10*3/uL (ref 0.1–1.0)
Monocytes Relative: 8 %
Neutro Abs: 3.5 10*3/uL (ref 1.7–7.7)
Neutrophils Relative %: 56 %
Platelet Count: 226 10*3/uL (ref 150–400)
RBC: 3.89 MIL/uL — ABNORMAL LOW (ref 4.22–5.81)
RDW: 19.3 % — ABNORMAL HIGH (ref 11.5–15.5)
WBC Count: 6.2 10*3/uL (ref 4.0–10.5)
nRBC: 0 % (ref 0.0–0.2)

## 2023-02-05 LAB — CMP (CANCER CENTER ONLY)
ALT: 12 U/L (ref 0–44)
AST: 17 U/L (ref 15–41)
Albumin: 4 g/dL (ref 3.5–5.0)
Alkaline Phosphatase: 101 U/L (ref 38–126)
Anion gap: 4 — ABNORMAL LOW (ref 5–15)
BUN: 9 mg/dL (ref 6–20)
CO2: 28 mmol/L (ref 22–32)
Calcium: 9.2 mg/dL (ref 8.9–10.3)
Chloride: 108 mmol/L (ref 98–111)
Creatinine: 0.94 mg/dL (ref 0.61–1.24)
GFR, Estimated: >60 mL/min (ref >60)
Glucose, Bld: 125 mg/dL — ABNORMAL HIGH (ref 70–99)
Potassium: 3.8 mmol/L (ref 3.5–5.1)
Sodium: 140 mmol/L (ref 135–145)
Total Bilirubin: 0.4 mg/dL (ref 0.0–1.2)
Total Protein: 6.7 g/dL (ref 6.5–8.1)

## 2023-02-05 LAB — TSH: TSH: 0.549 u[IU]/mL (ref 0.350–4.500)

## 2023-02-05 LAB — TOTAL PROTEIN, URINE DIPSTICK: Protein, ur: NEGATIVE mg/dL

## 2023-02-05 LAB — CEA (ACCESS): CEA (CHCC): 681.68 ng/mL — ABNORMAL HIGH (ref 0.00–5.00)

## 2023-02-05 MED ORDER — DEXAMETHASONE 4 MG PO TABS: 4.0000 mg | ORAL_TABLET | Freq: Every day | ORAL | 1 refills | Status: DC

## 2023-02-05 MED ORDER — NIVOLUMAB CHEMO INJECTION 100 MG/10ML: 240.0000 mg | Freq: Once | INTRAVENOUS | Status: DC

## 2023-02-05 MED ORDER — GABAPENTIN 300 MG PO CAPS: 300.0000 mg | ORAL_CAPSULE | Freq: Three times a day (TID) | ORAL | 2 refills | Status: DC

## 2023-02-05 MED ORDER — SODIUM CHLORIDE 0.9% FLUSH
10.0000 mL | Freq: Once | INTRAVENOUS | Status: AC
  Administered 2023-02-05: 10 mL

## 2023-02-05 MED ORDER — SODIUM CHLORIDE 0.9% FLUSH
10.0000 mL | INTRAVENOUS | Status: DC | PRN
  Administered 2023-02-05: 10 mL

## 2023-02-05 MED ORDER — SODIUM CHLORIDE 0.9 % IV SOLN
8.0000 mg/kg | Freq: Once | INTRAVENOUS | Status: AC
  Administered 2023-02-05: 700 mg via INTRAVENOUS
  Filled 2023-02-05: qty 50

## 2023-02-05 MED ORDER — FAMOTIDINE IN NACL 20-0.9 MG/50ML-% IV SOLN
20.0000 mg | Freq: Once | INTRAVENOUS | Status: AC
Start: 2023-02-05 — End: 2023-02-05
  Administered 2023-02-05: 20 mg via INTRAVENOUS
  Filled 2023-02-05: qty 50

## 2023-02-05 MED ORDER — DIPHENHYDRAMINE HCL 50 MG/ML IJ SOLN
25.0000 mg | Freq: Once | INTRAMUSCULAR | Status: AC
  Administered 2023-02-05: 25 mg via INTRAVENOUS
  Filled 2023-02-05: qty 1

## 2023-02-05 MED ORDER — HEPARIN SOD (PORK) LOCK FLUSH 100 UNIT/ML IV SOLN
500.0000 [IU] | Freq: Once | INTRAVENOUS | Status: AC | PRN
  Administered 2023-02-05: 500 [IU]

## 2023-02-05 MED ORDER — SODIUM CHLORIDE 0.9 % IV SOLN
120.0000 mg/m2 | Freq: Once | INTRAVENOUS | Status: AC
  Administered 2023-02-05: 234 mg via INTRAVENOUS
  Filled 2023-02-05: qty 39

## 2023-02-05 MED ORDER — SODIUM CHLORIDE 0.9 % IV SOLN: INTRAVENOUS | Status: DC

## 2023-02-05 NOTE — Patient Instructions (Signed)
[facility] - A DEPT OF [facility]  Discharge Instructions: Thank you for choosing [facility] to provide your oncology and hematology care.   If you have a lab appointment with the Cancer Center, please go directly to the Cancer Center and check in at the registration area.   Wear comfortable clothing and clothing appropriate for easy access to any Portacath or PICC line.   We strive to give you quality time with your provider. You may need to reschedule your appointment if you arrive late (15 or more minutes).  Arriving late affects you and other patients whose appointments are after yours.  Also, if you miss three or more appointments without notifying the office, you may be dismissed from the clinic at the provider's discretion.      For prescription refill requests, have your pharmacy contact our office and allow 72 hours for refills to be completed.    Today you received the following chemotherapy and/or immunotherapy agents Ramucirumab and  Paclitaxel    To help prevent nausea and vomiting after your treatment, we encourage you to take your nausea medication as directed.  BELOW ARE SYMPTOMS THAT SHOULD BE REPORTED IMMEDIATELY: *FEVER GREATER THAN 100.4 F (38 C) OR HIGHER *CHILLS OR SWEATING *NAUSEA AND VOMITING THAT IS NOT CONTROLLED WITH YOUR NAUSEA MEDICATION *UNUSUAL SHORTNESS OF BREATH *UNUSUAL BRUISING OR BLEEDING *URINARY PROBLEMS (pain or burning when urinating, or frequent urination) *BOWEL PROBLEMS (unusual diarrhea, constipation, pain near the anus) TENDERNESS IN MOUTH AND THROAT WITH OR WITHOUT PRESENCE OF ULCERS (sore throat, sores in mouth, or a toothache) UNUSUAL RASH, SWELLING OR PAIN  UNUSUAL VAGINAL DISCHARGE OR ITCHING   Items with * indicate a potential emergency and should be followed up as soon as possible or go to the Emergency Department if any problems should occur.  Please show the CHEMOTHERAPY ALERT CARD  or IMMUNOTHERAPY ALERT CARD at check-in to the Emergency Department and triage nurse.  Should you have questions after your visit or need to cancel or reschedule your appointment, please contact [facility] - A DE[facility]  Dept: [phone]  and follow the prompts.  Office hours are 8:00 a.m. to 4:30 p.m. Monday - Friday. Please note that voicemails left after 4:00 p.m. may not be returned until the following business day.  We are closed weekends and major holidays. You have access to a nurse at all times for urgent questions. Please call the main number to the clinic Dept: [phone] and follow the prompts.   For any non-urgent questions, you may also contact your provider using MyChart. We now offer e-Visits for anyone 55 and older to request care online for non-urgent symptoms. For details visit [email or web address].   Also download the MyChart app! Go to the app store, search "MyChart", open the app, select [facility], and log in with your MyChart username and password.  Ramucirumab Injection What is this medication? RAMUCIRUMAB (ra mue SIR ue mab) treats some types of cancer. It works by blocking a protein that causes cancer cells to grow and multiply. This helps to slow or stop the spread of cancer cells. It is a monoclonal antibody. This medicine may be used for other purposes; ask your health care provider or pharmacist if you have questions. COMMON BRAND NAME(S): Cyramza What should I tell my care team before I take this medication? They need to know if you have any of  these conditions: Blood clots Having or recent surgery Heart attack High blood pressure History of a tear in your stomach or intestines Liver disease Protein in your urine Stomach bleeding Stroke Thyroid disease An unusual or allergic reaction to ramucirumab, other medications, foods, dyes, or preservatives Pregnant or trying to get pregnant Breast-feeding How should  I use this medication? This medication is injected into a vein. It is given by your care team in a hospital or clinic setting. Talk to your care team about the use of this medication in children. Special care may be needed. Overdosage: If you think you have taken too much of this medicine contact a poison control center or emergency room at once. NOTE: This medicine is only for you. Do not share this medicine with others. What if I miss a dose? Keep appointments for follow-up doses. It is important not to miss your dose. Call your care team if you are unable to keep an appointment. What may interact with this medication? Interactions have not been studied. This list may not describe all possible interactions. Give your health care provider a list of all the medicines, herbs, non-prescription drugs, or dietary supplements you use. Also tell them if you smoke, drink alcohol, or use illegal drugs. Some items may interact with your medicine. What should I watch for while using this medication? Your condition will be monitored carefully while you are receiving this medication. You may need blood work while taking this medication. This medication may make you feel generally unwell. This is not uncommon as chemotherapy can affect health cells as well as cancer cells. Report any side effects. Continue your course of treatment even though you feel ill unless your care team tells you to stop. This medication may increase your risk to bruise or bleed. Call your care team if you notice any unusual bleeding. Before having surgery, talk to your care team to make sure it is ok. This medication can increase the risk of poor healing of your surgical site or wound. You will need to stop this medication for 28 days before surgery. After surgery, wait at least 2 weeks before restarting this medication. Make sure the surgical site or wound is healed enough before restarting this medication. Talk to your care team if  questions. Talk to your care team if you may be pregnant. Serious birth defects can occur if you take this medication during pregnancy and for 3 months after the last dose. You will need a negative pregnancy test before starting this medication. Contraception is recommended while taking this medication and for 3 months after the last dose. Your care team can help you find the option that works for you. Do not breastfeed while taking this medication and for 2 months after the last dose. This medication may cause infertility. Talk to your care team if you are concerned about your fertility. What side effects may I notice from receiving this medication? Side effects that you should report to your care team as soon as possible: Allergic reactions--skin rash, itching, hives, swelling of the face, lips, tongue, or throat Bleeding--bloody or black, tar-like stools, vomiting blood or brown material that looks like coffee grounds, red or dark brown urine, small red or purple spots on skin, unusual bruising or bleeding Dizziness, loss of balance or coordination, confusion or trouble speaking Heart attack--pain or tightness in the chest, shoulders, arms, or jaw, nausea, shortness of breath, cold or clammy skin, feeling faint or lightheaded Increase in blood pressure Infection--fever, chills, cough,  sore throat, wounds that don't heal, pain or trouble when passing urine, general feeling of discomfort or being unwell Infusion reactions--chest pain, shortness of breath or trouble breathing, feeling faint or lightheaded Kidney injury--decrease in the amount of urine, swelling of the ankles, hands, or feet Liver injury--right upper belly pain, loss of appetite, nausea, light-colored stool, dark yellow or brown urine, yellowing skin or eyes, unusual weakness or fatigue Low thyroid levels (hypothyroidism)--unusual weakness or fatigue, increased sensitivity to cold, constipation, hair loss, dry skin, weight gain,  feelings of depression Stomach pain that is severe, does not go away, or gets worse Stroke--sudden numbness or weakness of the face, arm, or leg, trouble speaking, confusion, trouble walking, loss of balance or coordination, dizziness, severe headache, change in vision Sudden and severe headache, confusion, change in vision, seizures, which may be signs of posterior reversible encephalopathy syndrome (PRES) Side effects that usually do not require medical attention (report to your care team if they continue or are bothersome): Diarrhea Fatigue Stomach pain Swelling of the ankles, hands, or feet This list may not describe all possible side effects. Call your doctor for medical advice about side effects. You may report side effects to FDA at 1-800-FDA-1088. Where should I keep my medication? This medication is given in a hospital or clinic. It will not be stored at home. NOTE: This sheet is a summary. It may not cover all possible information. If you have questions about this medicine, talk to your doctor, pharmacist, or health care provider.  2024 Elsevier/Gold Standard (2021-05-16 00:00:00)  Paclitaxel Injection What is this medication? PACLITAXEL (PAK li TAX el) treats some types of cancer. It works by slowing down the growth of cancer cells. This medicine may be used for other purposes; ask your health care provider or pharmacist if you have questions. COMMON BRAND NAME(S): Onxol, Taxol What should I tell my care team before I take this medication? They need to know if you have any of these conditions: Heart disease Liver disease Low white blood cell levels An unusual or allergic reaction to paclitaxel, other medications, foods, dyes, or preservatives If you or your partner are pregnant or trying to get pregnant Breast-feeding How should I use this medication? This medication is injected into a vein. It is given by your care team in a hospital or clinic setting. Talk to your care  team about the use of this medication in children. While it may be given to children for selected conditions, precautions do apply. Overdosage: If you think you have taken too much of this medicine contact a poison control center or emergency room at once. NOTE: This medicine is only for you. Do not share this medicine with others. What if I miss a dose? Keep appointments for follow-up doses. It is important not to miss your dose. Call your care team if you are unable to keep an appointment. What may interact with this medication? Do not take this medication with any of the following: Live virus vaccines Other medications may affect the way this medication works. Talk with your care team about all of the medications you take. They may suggest changes to your treatment plan to lower the risk of side effects and to make sure your medications work as intended. This list may not describe all possible interactions. Give your health care provider a list of all the medicines, herbs, non-prescription drugs, or dietary supplements you use. Also tell them if you smoke, drink alcohol, or use illegal drugs. Some items may interact  with your medicine. What should I watch for while using this medication? Your condition will be monitored carefully while you are receiving this medication. You may need blood work while taking this medication. This medication may make you feel generally unwell. This is not uncommon as chemotherapy can affect healthy cells as well as cancer cells. Report any side effects. Continue your course of treatment even though you feel ill unless your care team tells you to stop. This medication can cause serious allergic reactions. To reduce the risk, your care team may give you other medications to take before receiving this one. Be sure to follow the directions from your care team. This medication may increase your risk of getting an infection. Call your care team for advice if you get a fever,  chills, sore throat, or other symptoms of a cold or flu. Do not treat yourself. Try to avoid being around people who are sick. This medication may increase your risk to bruise or bleed. Call your care team if you notice any unusual bleeding. Be careful brushing or flossing your teeth or using a toothpick because you may get an infection or bleed more easily. If you have any dental work done, tell your dentist you are receiving this medication. Talk to your care team if you may be pregnant. Serious birth defects can occur if you take this medication during pregnancy. Talk to your care team before breastfeeding. Changes to your treatment plan may be needed. What side effects may I notice from receiving this medication? Side effects that you should report to your care team as soon as possible: Allergic reactions--skin rash, itching, hives, swelling of the face, lips, tongue, or throat Heart rhythm changes--fast or irregular heartbeat, dizziness, feeling faint or lightheaded, chest pain, trouble breathing Increase in blood pressure Infection--fever, chills, cough, sore throat, wounds that don't heal, pain or trouble when passing urine, general feeling of discomfort or being unwell Low blood pressure--dizziness, feeling faint or lightheaded, blurry vision Low red blood cell level--unusual weakness or fatigue, dizziness, headache, trouble breathing Painful swelling, warmth, or redness of the skin, blisters or sores at the infusion site Pain, tingling, or numbness in the hands or feet Slow heartbeat--dizziness, feeling faint or lightheaded, confusion, trouble breathing, unusual weakness or fatigue Unusual bruising or bleeding Side effects that usually do not require medical attention (report to your care team if they continue or are bothersome): Diarrhea Hair loss Joint pain Loss of appetite Muscle pain Nausea Vomiting This list may not describe all possible side effects. Call your doctor for  medical advice about side effects. You may report side effects to FDA at 1-800-FDA-1088. Where should I keep my medication? This medication is given in a hospital or clinic. It will not be stored at home. NOTE: This sheet is a summary. It may not cover all possible information. If you have questions about this medicine, talk to your doctor, pharmacist, or health care provider.  2024 Elsevier/Gold Standard (2021-05-14 00:00:00)

## 2023-02-05 NOTE — Progress Notes (Signed)
[facility]   [phone] Fax:([phone]) [phone]   Clinic Follow up Note   Patient Care Team: Pcp, No as PCP - Serita Grit, MD as Consulting Physician (Hematology and Oncology)  Date of Service:  02/05/2023  CHIEF COMPLAINT: f/u of gastric cancer  CURRENT THERAPY:  Paclitaxel, ramucirumab and nivolumab every 2 weeks  Oncology History   Gastric cancer (HCC) cT3N3Mx, with hypermetabolic left supraclavicular node, and indeterminate adrenal nodule, MMR normal ypT3ypN3a, liver and nodes recurrence in 07/2022 -diagnosed 10/18/21 by EGD for 6 month f/u of gastric ulcers, showed mucosal intramucosal adenocarcinoma. MMR normal. -baseline CEA 11/01/21 significantly elevated at 1,989.58. -EUS on 11/06/21 by Dr. Dulce Sellar, staged as T3 N3 (at least stage III) -staging CT CAP 11/08/21 showed: stable gastric antrum wall thickening; increased size of upper abdominal lymph nodes; stable left adrenal nodule, 1.9 cm. -PET scan showed FDG avid lymph nodes within the gastrohepatic ligament, portacaval region, and mesentery. Tracer avid left supraclavicular lymph node and indeterminate left adrenal gland nodule with mild FDG uptake. -he underwent [place] node biopsy on 1/17 which was negative for malignant cells, but no lymphoid tissue seen on biopsy  -repeated PET on 02/07/22 after 3 months neoadjuvant chemo showed resolved left supraclavicular lymph node, and significant improvement in regional lymph nodes and the primary tumor, stable and indeterminate left adrenal nodule.  -CT adrenal protocol showed the left adrenal nodule is likely benign adenoma, no biopsy is needed. -Completed neoadjvant chemo FLOT s/p 12 cycles 11/22/21 - 04/27/2022 -restaging PET on 05/16/22 showed significantly hypermetabolic activity at the primary gastric cancer in pylorus, and a small hypermetabolic mesenteric lymph node, no other evidence of metastasis. surgeon Dr. Sheliah Hatch aware of PET findings -S/p distal  gastrectomy 05/20/22, path showed ypT3N3a with clear margins, 7/18 + LNs, and lymphovascular invasion identified. There was some treatment effect in a few lymph nodes nut not in the primary tumor -due to rising tumor marker, he underwent PET scan on July 07, 2022, which unfortunately showed hypermetabolic new adenopathy in right hilar and mesentery, diffuse liver metastasis, consistent with metastatic recurrence.  I personally reviewed the PET scan images with patient -Unfortunately his disease is not curable at this stage. -The goal of chemotherapy is palliative, to prolong his life -I discussed his next generation sequencing foundation one result, which showed EGFR amplification, no other targetable mutations.  His tumor was previously tested for HER2 which was negative (0-1+).  PD-L1 CPS score was 2%, no significant benefit of immunotherapy.  -He started paclitaxel and ramucirumab on 07/25/2022  He developed worsening neuropathy quickly, and I had to switch his treatment to FOLFIRI on 08/29/2022, every 2 weeks, he is overall tolerating well. -restaging CT 01/19/2023 showed disease progression with new pathologic right paratracheal mediastinal lymph nodes as well as increasing size of several liver metastases. -will change his chemo to taxol and ramucizumab, every 2 weeks per his request     Assessment and Plan    Gastric Cancer Follow-up for gastric cancer. Starting a new chemotherapy regimen today, including Taxol, ramucirumab, and nivolumab. 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 Experiences weekend fatigue post-chemotherapy but recovers by Monday. Eating well. Advised to use pantoprazole as needed for acid reflux. Monthly urine protein checks due to ramucirumab. Informed about potential side effects: neuropathy, fatigue, nausea, alopecia, cramps, hypertension, bleeding, thrombosis, thyroid dysfunction, pneumonitis, rash,  and arthralgia. Advised to avoid dexamethasone due to interaction with nivolumab. Discussed ice use to mitigate  neuropathy and importance of monitoring side effects. Current regimen is moderate dose, administered biweekly, to manage side effects better than higher doses. - Administer chemotherapy regimen (Taxol, ramucirumab, nivolumab) biweekly - Advise use of ice during and after chemotherapy to mitigate neuropathy - Refill gabapentin 300 mg TID - Avoid dexamethasone due to interaction with nivolumab - Perform monthly urine protein checks - Monitor for side effects: hypertension, bleeding, thrombosis, thyroid dysfunction, pneumonitis, rash, and arthralgia - Advise use of pantoprazole as needed for acid reflux  Neuropathy Reports numbness and tingling but can still perform daily activities. Taking gabapentin for neuropathy. Discussed the importance of using ice to mitigate symptoms. - Refill gabapentin 300 mg TID - Advise use of ice during and after chemotherapy to mitigate neuropathy  Plan -Lab reviewed, adequate for treatment, will proceed paclitaxel and ramucirumab today, hold nivolumab due to pending insurance approval -I refilled gabapentin - Schedule follow-up appointment in two weeks.         SUMMARY OF ONCOLOGIC HISTORY: Oncology History Overview Note   Cancer Staging  Gastric cancer [facility]) Staging form: Stomach, AJCC 8th Edition - Clinical stage from 10/18/2021: Stage III (cT3, cN3a, cM0) - Signed by Malachy Mood, MD on 11/10/2021     Gastric cancer (HCC)  10/18/2021 Procedure   EGD performed under the care of Dr. Bosie Clos  Findings:  -A large, ulcerated, partially circumferential (involving two thirds of the lumen circumference) mass with no bleeding and stigmata of recent bleeding was found in the gastric antrum, at the pylorus and in the prepyloric region of the stomach. -Segmental severe inflammation characterized by congestion (edema) and erythema was found in the  gastric antrum.   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 Negative for dysplasia.  Negative for helicobacter pylori  Mismatch Repair (MMR) Protein Imunohistochemistry (IHC): IHC Expression Result: MLH1: Preserved nuclear expression. MSH2: Preserved nuclear expression. MSH6: Preserved nuclear expression. PMS2: Preserved nuclear expression. Interpretation: NORMAL   10/31/2021 Initial Diagnosis   Gastric cancer (HCC)   11/01/2021 Tumor Marker   CEA = 6,578.46 (^)   11/06/2021 Procedure   Upper EUS, Dr. Dulce Sellar  Impression: - Normal esophagus. - A small amount of food (residue) in the stomach. - Congested, friable (with contact bleeding) and ulcerated mucosa in the prepyloric region of the stomach. - Normal examined duodenum. - There was no evidence of significant pathology in the left lobe of the liver. - Many abnormal lymph nodes (over 10) were visualized in the gastrohepatic ligament (level 18), celiac region (level 20), perigastric region, peripancreatic region and aortocaval region. - Wall thickening was seen in the prepyloric region of the stomach. The thickening appeared to be primarily within the deep mucosa (Layer 2) but extended through the muscularis propria. - Overall constellation of findings consistent with at least T3 N3 Mx (at least stage III) gastric adenocarcinoma.   11/08/2021 Imaging   EXAM: CT CHEST, ABDOMEN, AND PELVIS WITH CONTRAST  IMPRESSION: 1. Similar irregular wall thickening of the gastric antrum, consistent with patient's known  primary gastric neoplasm. 2. Increased size of the upper abdominal lymph nodes, concerning for worsening nodal disease involvement. 3. Stable left adrenal nodule common  nonspecific possibly a metastatic lesion or adenoma. 4. No evidence of metastatic disease in the chest. 5. Left-sided colonic diverticulosis without findings of acute diverticulitis. 6. Enlarged prostate gland. 7.  Aortic Atherosclerosis (ICD10-I70.0).   11/22/2021 - 04/26/2022 Chemotherapy   Patient is on Treatment Plan : GASTROESOPHAGEAL FLOT q14d X 4 cycles     11/27/2021 Imaging    IMPRESSION: 1. The mass within the gastric antrum is mildly decreased in size when compared with 11/08/2021. There is persistent FDG uptake associated with this mass compatible with residual tumor. 2. Persistent FDG avid lymph nodes within the gastrohepatic ligament, portacaval region, and mesentery. These are mildly decreased in size when compared with 11/08/2021. 3. Tracer avid left supraclavicular lymph node is also mildly decreased in size when compared with 11/08/2021. 4. Indeterminate left adrenal gland nodule with mild FDG uptake. This may represent a lipid poor adenoma. Metastatic disease not exclude. 5. Diffuse increased uptake throughout the bone marrow is favored to represent treatment related change. 6.  Aortic Atherosclerosis (ICD10-I70.0).     02/07/2022 Imaging    IMPRESSION: 1. No substantial change in hypermetabolism associated with the distal gastric lesion. Although difficult to discern on noncontrast CT imaging, the soft tissue component appears to be decreased since the prior PET-CT. 2. Interval resolution of the hypermetabolic left supraclavicular lymph node. 3. Interval marked decrease of the hypermetabolic gastrohepatic ligament lymphadenopathy. Similar decrease in size and hypermetabolism associated with index nodes identified previously in the 4. porta hepatis and hypogastric region. 5. Stable left  adrenal nodule with slight hypermetabolism. Nonspecific finding could represent lipid poor adenoma or metastatic deposit. 6. No new suspicious hypermetabolic disease on today's study. 7.  Aortic Atherosclerosis (ICD10-I70.0).   07/25/2022 - 08/15/2022 Chemotherapy   Patient is on Treatment Plan : GASTROESOPHAGEAL Ramucirumab D1, 15 + Paclitaxel D1,8,15 q28d     08/29/2022 - 01/24/2023 Chemotherapy   Patient is on Treatment Plan : COLORECTAL FOLFIRI q14d     02/05/2023 -  Chemotherapy   Patient is on Treatment Plan : GASTROESOPHAGEAL Ramucirumab D1, 15 + Paclitaxel D1,8,15 q28d        Discussed the use of AI scribe software for clinical note transcription with the patient, who gave verbal consent to proceed.  History of Present Illness   A 53 year old gentleman with a history of gastric cancer presents for a follow-up visit. He is about to start a new chemotherapy regimen, which he has had before. The patient reports neuropathy, describing numbness and tingling. He manages these symptoms with gabapentin, which he takes three times a day. He reports no difficulty in performing tasks with his hands. The patient also experiences fatigue, particularly on the weekends after chemotherapy. Despite this, he reports a good appetite. He also takes dexamethasone after chemotherapy for a couple of days. The patient has a prescription for pantoprazole, which he takes as needed for acid reflux.         All other systems were reviewed with the patient and are negative.  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    SURGICAL HISTORY: Past Surgical History:  Procedure Laterality Date   BIOPSY  04/12/2021   Procedure: BIOPSY;   Surgeon: Charlott Rakes, MD;  Location: [facility];  Service: Gastroenterology;;   ESOPHAGOGASTRODUODENOSCOPY N/A 04/12/2021   Procedure: ESOPHAGOGASTRODUODENOSCOPY (EGD);  Surgeon: Charlott Rakes, MD;  Location: [facility];  Service: Gastroenterology;  Laterality: N/A;   ESOPHAGOGASTRODUODENOSCOPY N/A 11/06/2021   Procedure: ESOPHAGOGASTRODUODENOSCOPY (EGD);  Surgeon: Willis Modena, MD;  Location: [facility];  Service: Gastroenterology;  Laterality: N/A;   GASTRECTOMY N/A 05/20/2022   Procedure: OPEN PARTIAL GASTRECTOMY WITH GASTROJEJUNAL ANASTOMOSIS;  Surgeon: Sheliah Hatch, De Blanch, MD;  Location: [facility];  Service: General;  Laterality: N/A;   HERNIA REPAIR Left    inguinal   IR IMAGING GUIDED PORT INSERTION  11/15/2021   LAPAROSCOPY N/A 05/20/2022   Procedure: LAPAROSCOPY DIAGNOSTIC;  Surgeon: Rodman Pickle, MD;  Location: [facility];  Service: General;  Laterality: N/A;   UPPER ESOPHAGEAL ENDOSCOPIC ULTRASOUND (EUS) Bilateral 11/06/2021   Procedure: UPPER ESOPHAGEAL ENDOSCOPIC ULTRASOUND (EUS);  Surgeon: Willis Modena, MD;  Location: [facility];  Service: Gastroenterology;  Laterality: Bilateral;    I have reviewed the social history and family history with the patient and they are unchanged from previous note.  ALLERGIES:  has no known allergies.  MEDICATIONS:  Current Outpatient Medications  Medication Sig Dispense Refill   gabapentin (NEURONTIN) 300 MG capsule Take 1 capsule (300 mg total) by mouth 3 (three) times daily. 90 capsule 2   acetaminophen (TYLENOL) 500 MG tablet Take 1 tablet (500 mg total) by mouth 2 (two) times daily. 60 tablet 3   dexamethasone (DECADRON) 4 MG tablet Take 1-2 tablets (4-8 mg total) by mouth daily. Start the day after chemotherapy for 1-2 days.  Take with food. 30 tablet 1   gabapentin (NEURONTIN) 100 MG capsule Take 1-2 capsules (100-200 mg total) by mouth 4 (four) times daily as needed. OK to increase to 2 or 3 capsules in a few  weeks if tolerates well 240 capsule 2   ondansetron (ZOFRAN-ODT) 4 MG disintegrating tablet Dissolve 1 tablet (4 mg total) by mouth every 6 (six) hours as needed for nausea or vomiting. 30 tablet 1   pantoprazole (PROTONIX) 40 MG tablet Take 1 tablet (40 mg) by mouth 2 times daily. 60 tablet 6   prochlorperazine (COMPAZINE) 10 MG tablet Take 1 tablet (10 mg total) by mouth every 6 (six) hours as needed for nausea or vomiting. 30 tablet 2   No current facility-administered medications for this visit.   Facility-Administered Medications Ordered in Other Visits  Medication Dose Route Frequency Provider Last Rate Last Admin   0.9 %  sodium chloride infusion   Intravenous Continuous Malachy Mood, MD 10 mL/hr at 02/05/23 0913 New Bag at 02/05/23 0913   heparin lock flush 100 unit/mL  500 Units Intracatheter Once PRN Malachy Mood, MD       PACLitaxel (TAXOL) 234 mg in sodium chloride 0.9 % 250 mL chemo infusion (</= 80mg /m2)  120 mg/m2 (Treatment Plan Recorded) Intravenous Once Malachy Mood, MD       ramucirumab [facility]) 700 mg in sodium chloride 0.9 % 180 mL chemo infusion  8 mg/kg (Treatment Plan Recorded) Intravenous Once Malachy Mood, MD       sodium chloride flush (NS) 0.9 % injection 10 mL  10 mL Intracatheter PRN Malachy Mood, MD        PHYSICAL EXAMINATION: ECOG PERFORMANCE STATUS: 1 - Symptomatic but completely ambulatory  Vitals:   02/05/23 0754  BP: 138/74  Pulse: 72  Resp: 16  Temp: 98.7 F (37.1 C)  SpO2: 100%   Wt Readings from Last 3 Encounters:  02/05/23 185  lb 1.6 oz (84 kg)  01/22/23 185 lb (83.9 kg)  01/08/23 187 lb (84.8 kg)     GENERAL:alert, no distress and comfortable SKIN: skin color, texture, turgor are normal, no rashes or significant lesions EYES: normal, Conjunctiva are pink and non-injected, sclera clear NECK: supple, thyroid normal size, non-tender, without nodularity LYMPH:  no palpable lymphadenopathy in the cervical, axillary  LUNGS: clear to auscultation and  percussion with normal breathing effort HEART: regular rate & rhythm and no murmurs and no lower extremity edema ABDOMEN:abdomen soft, non-tender and normal bowel sounds Musculoskeletal:no cyanosis of digits and no clubbing  NEURO: alert & oriented x 3 with fluent speech, no focal motor/sensory deficits    LABORATORY DATA:  I have reviewed the data as listed    Latest Ref Rng & Units 02/05/2023    7:34 AM 01/22/2023    8:09 AM 01/08/2023    8:59 AM  CBC  WBC 4.0 - 10.5 K/uL 6.2  4.8  5.8   Hemoglobin 13.0 - 17.0 g/dL 16.1  09.6  04.5   Hematocrit 39.0 - 52.0 % 34.5  32.6  32.3   Platelets 150 - 400 K/uL 226  234  194         Latest Ref Rng & Units 02/05/2023    7:34 AM 01/22/2023    8:09 AM 01/08/2023    8:59 AM  CMP  Glucose 70 - 99 mg/dL 409  811  914   BUN 6 - 20 mg/dL 9  9  7    Creatinine 0.61 - 1.24 mg/dL 7.82  9.56  2.13   Sodium 135 - 145 mmol/L 140  142  143   Potassium 3.5 - 5.1 mmol/L 3.8  3.8  4.1   Chloride 98 - 111 mmol/L 108  109  111   CO2 22 - 32 mmol/L 28  28  27    Calcium 8.9 - 10.3 mg/dL 9.2  9.0  8.9   Total Protein 6.5 - 8.1 g/dL 6.7  6.3  6.3   Total Bilirubin 0.0 - 1.2 mg/dL 0.4  0.3  0.3   Alkaline Phos 38 - 126 U/L 101  91  93   AST 15 - 41 U/L 17  14  14    ALT 0 - 44 U/L 12  9  11        RADIOGRAPHIC STUDIES: I have personally reviewed the radiological images as listed and agreed with the findings in the report. No results found.    Orders Placed This Encounter  Procedures   CBC with Differential (Cancer Center Only)    Standing Status:   Future    Expected Date:   04/02/2023    Expiration Date:   04/01/2024   CMP (Cancer Center only)    Standing Status:   Future    Expected Date:   04/02/2023    Expiration Date:   04/01/2024   T4    Standing Status:   Future    Expected Date:   04/02/2023    Expiration Date:   04/01/2024   TSH    Standing Status:   Future    Expected Date:   04/02/2023    Expiration Date:   04/01/2024   Total Protein,  Urine dipstick    Standing Status:   Future    Expected Date:   04/02/2023    Expiration Date:   04/01/2024   CBC with Differential (Cancer Center Only)    Standing Status:   Future    Expected  Date:   04/16/2023    Expiration Date:   04/15/2024   CMP (Cancer Center only)    Standing Status:   Future    Expected Date:   04/16/2023    Expiration Date:   04/15/2024   CBC with Differential (Cancer Center Only)    Standing Status:   Future    Expected Date:   04/30/2023    Expiration Date:   04/29/2024   CMP (Cancer Center only)    Standing Status:   Future    Expected Date:   04/30/2023    Expiration Date:   04/29/2024   CBC with Differential (Cancer Center Only)    Standing Status:   Future    Expected Date:   05/14/2023    Expiration Date:   05/13/2024   CMP (Cancer Center only)    Standing Status:   Future    Expected Date:   05/14/2023    Expiration Date:   05/13/2024   All questions were answered. The patient knows to call the clinic with any problems, questions or concerns. No barriers to learning was detected. The total time spent in the appointment was 30 minutes.     Malachy Mood, MD 02/05/2023

## 2023-02-06 LAB — T4: T4, Total: 6.4 ug/dL (ref 4.5–12.0)

## 2023-02-07 ENCOUNTER — Other Ambulatory Visit: Payer: Self-pay

## 2023-02-19 ENCOUNTER — Inpatient Hospital Stay: Payer: BC Managed Care – PPO | Attending: Physician Assistant

## 2023-02-19 ENCOUNTER — Inpatient Hospital Stay: Payer: BC Managed Care – PPO

## 2023-02-19 ENCOUNTER — Encounter: Payer: Self-pay | Admitting: Hematology

## 2023-02-19 ENCOUNTER — Inpatient Hospital Stay (HOSPITAL_BASED_OUTPATIENT_CLINIC_OR_DEPARTMENT_OTHER): Payer: BC Managed Care – PPO | Admitting: Hematology

## 2023-02-19 VITALS — BP 131/76 | HR 81 | Temp 97.7°F | Resp 16 | Wt 186.4 lb

## 2023-02-19 DIAGNOSIS — Z79899 Other long term (current) drug therapy: Secondary | ICD-10-CM | POA: Insufficient documentation

## 2023-02-19 DIAGNOSIS — Z7952 Long term (current) use of systemic steroids: Secondary | ICD-10-CM | POA: Insufficient documentation

## 2023-02-19 DIAGNOSIS — Z5112 Encounter for antineoplastic immunotherapy: Secondary | ICD-10-CM | POA: Insufficient documentation

## 2023-02-19 DIAGNOSIS — E278 Other specified disorders of adrenal gland: Secondary | ICD-10-CM | POA: Diagnosis not present

## 2023-02-19 DIAGNOSIS — E669 Obesity, unspecified: Secondary | ICD-10-CM | POA: Insufficient documentation

## 2023-02-19 DIAGNOSIS — T451X5D Adverse effect of antineoplastic and immunosuppressive drugs, subsequent encounter: Secondary | ICD-10-CM | POA: Insufficient documentation

## 2023-02-19 DIAGNOSIS — G620 Drug-induced polyneuropathy: Secondary | ICD-10-CM | POA: Diagnosis not present

## 2023-02-19 DIAGNOSIS — R970 Elevated carcinoembryonic antigen [CEA]: Secondary | ICD-10-CM | POA: Insufficient documentation

## 2023-02-19 DIAGNOSIS — Z5111 Encounter for antineoplastic chemotherapy: Secondary | ICD-10-CM | POA: Diagnosis present

## 2023-02-19 DIAGNOSIS — C163 Malignant neoplasm of pyloric antrum: Secondary | ICD-10-CM

## 2023-02-19 DIAGNOSIS — Z95828 Presence of other vascular implants and grafts: Secondary | ICD-10-CM

## 2023-02-19 LAB — CBC WITH DIFFERENTIAL (CANCER CENTER ONLY)
Abs Immature Granulocytes: 0.01 10*3/uL (ref 0.00–0.07)
Basophils Absolute: 0 10*3/uL (ref 0.0–0.1)
Basophils Relative: 1 %
Eosinophils Absolute: 0.1 10*3/uL (ref 0.0–0.5)
Eosinophils Relative: 2 %
HCT: 31.4 % — ABNORMAL LOW (ref 39.0–52.0)
Hemoglobin: 10 g/dL — ABNORMAL LOW (ref 13.0–17.0)
Immature Granulocytes: 0 %
Lymphocytes Relative: 38 %
Lymphs Abs: 1.7 10*3/uL (ref 0.7–4.0)
MCH: 28.1 pg (ref 26.0–34.0)
MCHC: 31.8 g/dL (ref 30.0–36.0)
MCV: 88.2 fL (ref 80.0–100.0)
Monocytes Absolute: 0.7 10*3/uL (ref 0.1–1.0)
Monocytes Relative: 15 %
Neutro Abs: 1.9 10*3/uL (ref 1.7–7.7)
Neutrophils Relative %: 44 %
Platelet Count: 247 10*3/uL (ref 150–400)
RBC: 3.56 MIL/uL — ABNORMAL LOW (ref 4.22–5.81)
RDW: 19.2 % — ABNORMAL HIGH (ref 11.5–15.5)
WBC Count: 4.4 10*3/uL (ref 4.0–10.5)
nRBC: 0 % (ref 0.0–0.2)

## 2023-02-19 LAB — CMP (CANCER CENTER ONLY)
ALT: 9 U/L (ref 0–44)
AST: 15 U/L (ref 15–41)
Albumin: 3.6 g/dL (ref 3.5–5.0)
Alkaline Phosphatase: 96 U/L (ref 38–126)
Anion gap: 4 — ABNORMAL LOW (ref 5–15)
BUN: 9 mg/dL (ref 6–20)
CO2: 27 mmol/L (ref 22–32)
Calcium: 8.6 mg/dL — ABNORMAL LOW (ref 8.9–10.3)
Chloride: 113 mmol/L — ABNORMAL HIGH (ref 98–111)
Creatinine: 0.9 mg/dL (ref 0.61–1.24)
GFR, Estimated: >60 mL/min (ref >60)
Glucose, Bld: 90 mg/dL (ref 70–99)
Potassium: 4 mmol/L (ref 3.5–5.1)
Sodium: 144 mmol/L (ref 135–145)
Total Bilirubin: 0.3 mg/dL (ref 0.0–1.2)
Total Protein: 6.2 g/dL — ABNORMAL LOW (ref 6.5–8.1)

## 2023-02-19 LAB — CEA (ACCESS): CEA (CHCC): 664.89 ng/mL — ABNORMAL HIGH (ref 0.00–5.00)

## 2023-02-19 MED ORDER — SODIUM CHLORIDE 0.9% FLUSH
10.0000 mL | INTRAVENOUS | Status: DC | PRN
  Administered 2023-02-19: 10 mL

## 2023-02-19 MED ORDER — SODIUM CHLORIDE 0.9 % IV SOLN
8.0000 mg/kg | Freq: Once | INTRAVENOUS | Status: AC
  Administered 2023-02-19: 700 mg via INTRAVENOUS
  Filled 2023-02-19: qty 20

## 2023-02-19 MED ORDER — ACETAMINOPHEN 325 MG PO TABS
650.0000 mg | ORAL_TABLET | Freq: Once | ORAL | Status: AC
  Administered 2023-02-19: 650 mg via ORAL
  Filled 2023-02-19: qty 2

## 2023-02-19 MED ORDER — SODIUM CHLORIDE 0.9% FLUSH
10.0000 mL | Freq: Once | INTRAVENOUS | Status: AC
  Administered 2023-02-19: 10 mL

## 2023-02-19 MED ORDER — SODIUM CHLORIDE 0.9 % IV SOLN: INTRAVENOUS | Status: DC

## 2023-02-19 MED ORDER — HEPARIN SOD (PORK) LOCK FLUSH 100 UNIT/ML IV SOLN
500.0000 [IU] | Freq: Once | INTRAVENOUS | Status: AC | PRN
  Administered 2023-02-19: 500 [IU]

## 2023-02-19 MED ORDER — DIPHENHYDRAMINE HCL 50 MG/ML IJ SOLN
25.0000 mg | Freq: Once | INTRAMUSCULAR | Status: AC
  Administered 2023-02-19: 25 mg via INTRAVENOUS
  Filled 2023-02-19: qty 1

## 2023-02-19 MED ORDER — SODIUM CHLORIDE 0.9 % IV SOLN
240.0000 mg | Freq: Once | INTRAVENOUS | Status: AC
  Administered 2023-02-19: 240 mg via INTRAVENOUS
  Filled 2023-02-19: qty 24

## 2023-02-19 MED ORDER — FAMOTIDINE IN NACL 20-0.9 MG/50ML-% IV SOLN
20.0000 mg | Freq: Once | INTRAVENOUS | Status: AC
  Administered 2023-02-19: 20 mg via INTRAVENOUS
  Filled 2023-02-19: qty 50

## 2023-02-19 MED ORDER — SODIUM CHLORIDE 0.9 % IV SOLN
120.0000 mg/m2 | Freq: Once | INTRAVENOUS | Status: AC
  Administered 2023-02-19: 234 mg via INTRAVENOUS
  Filled 2023-02-19: qty 39

## 2023-02-19 NOTE — Progress Notes (Signed)
[facility]   Telephone:([phone] Fax:([phone]   Clinic Follow up Note   Patient Care Team: Pcp, No as PCP - General Malachy Mood, MD as Consulting Physician (Hematology and Oncology)  Date of Service:  02/19/2023  CHIEF COMPLAINT: f/u of gastric cancer  CURRENT THERAPY:  Paclitaxel, ramucirumab and nivolumab every 2 weeks  Oncology History   Gastric cancer (HCC) cT3N3Mx, with hypermetabolic left supraclavicular node, and indeterminate adrenal nodule, MMR normal ypT3ypN3a, liver and nodes recurrence in 07/2022 -diagnosed 10/18/21 by EGD for 6 month f/u of gastric ulcers, showed mucosal intramucosal adenocarcinoma. MMR normal. -baseline CEA 11/01/21 significantly elevated at 1,989.58. -EUS on 11/06/21 by Dr. Dulce Sellar, staged as T3 N3 (at least stage III) -staging CT CAP 11/08/21 showed: stable gastric antrum wall thickening; increased size of upper abdominal lymph nodes; stable left adrenal nodule, 1.9 cm. -PET scan showed FDG avid lymph nodes within the gastrohepatic ligament, portacaval region, and mesentery. Tracer avid left supraclavicular lymph node and indeterminate left adrenal gland nodule with mild FDG uptake. -he underwent [place] node biopsy on 1/17 which was negative for malignant cells, but no lymphoid tissue seen on biopsy  -repeated PET on 02/07/22 after 3 months neoadjuvant chemo showed resolved left supraclavicular lymph node, and significant improvement in regional lymph nodes and the primary tumor, stable and indeterminate left adrenal nodule.  -CT adrenal protocol showed the left adrenal nodule is likely benign adenoma, no biopsy is needed. -Completed neoadjvant chemo FLOT s/p 12 cycles 11/22/21 - 04/27/2022 -restaging PET on 05/16/22 showed significantly hypermetabolic activity at the primary gastric cancer in pylorus, and a small hypermetabolic mesenteric lymph node, no other evidence of metastasis. surgeon Dr. Sheliah Hatch aware of PET findings -S/p distal  gastrectomy 05/20/22, path showed ypT3N3a with clear margins, 7/18 + LNs, and lymphovascular invasion identified. There was some treatment effect in a few lymph nodes nut not in the primary tumor -due to rising tumor marker, he underwent PET scan on July 07, 2022, which unfortunately showed hypermetabolic new adenopathy in right hilar and mesentery, diffuse liver metastasis, consistent with metastatic recurrence.  I personally reviewed the PET scan images with patient -Unfortunately his disease is not curable at this stage. -The goal of chemotherapy is palliative, to prolong his life -I discussed his next generation sequencing foundation one result, which showed EGFR amplification, no other targetable mutations.  His tumor was previously tested for HER2 which was negative (0-1+).  PD-L1 CPS score was 2%, no significant benefit of immunotherapy.  -He started paclitaxel and ramucirumab on 07/25/2022  He developed worsening neuropathy quickly, and I had to switch his treatment to FOLFIRI on 08/29/2022, every 2 weeks, he is overall tolerating well. -restaging CT 01/19/2023 showed disease progression with new pathologic right paratracheal mediastinal lymph nodes as well as increasing size of several liver metastases. -I changed his chemo to taxol and ramucizumab, every 2 weeks per his request    Assessment and Plan    Metastatic Gastric Cancer Follow-up for metastatic gastric cancer. Reports fatigue and mouth pain post-treatment, lasting about two days. Neuropathy is a significant concern but managed better with 300 mg dose. Dislikes using ice for neuropathy but understands its importance. Hemoglobin at 10, kidney and liver functions are normal. Tumor marker results pending. Immunotherapy with nivolumab approved.  --Chemotherapy consent: Side effects including but not limited to, fatigue, pneumonitis, colitis, hepatitis, nephritis, carditis, pancreatitis, thyroid and other endocrine dysfunction, rash,  arthralgia, muscular and neuro toxicities,  etc. were discussed with patient in great detail.  We discussed that most side effect of mild and manageable, but severe and life-threatening side effect could happen also and may require treatment and hospital admission. We discussed side effect happens in 40-60%.  Pt voiced good understanding and agrees to proceed. -the goal of therapy is curative/palliative to prolong life and prevent/improve cancer related symptoms. - Administer nivolumab every two weeks - Monitor thyroid function routinely - Monitor for potential side effects including cough, shortness of breath, chest pain, liver and kidney dysfunction, joint pain, and skin rash - Schedule next treatment on February 27 and March 13 - Allow occasional alcohol consumption, not immediately after chemo and no more than a few times a month - Administer Tylenol before infusion to reduce pain sensation from cold  Neuropathy Neuropathy is a significant concern, particularly regarding hand function. Managed with 300 mg dose, which is working better. Ice therapy is recommended despite discomfort. Emphasized importance of ice therapy to prevent severe neuropathy, which can lead to disability. - Continue 300 mg dose for neuropathy - Use ice therapy during treatment - Administer Tylenol before infusion to reduce pain sensation from cold  General Health Maintenance None - Monitor blood work including CBC, kidney, and liver function - Check tumor marker results when available  Plan -Lab reviewed, adequate for treatment, will proceed treatment today and continue every 2 weeks -Follow-up in 2 weeks        SUMMARY OF ONCOLOGIC HISTORY: Oncology History Overview Note   Cancer Staging  Gastric cancer [facility]) Staging form: Stomach, AJCC 8th Edition - Clinical stage from 10/18/2021: Stage III (cT3, cN3a, cM0) - Signed by Malachy Mood, MD on 11/10/2021     Gastric cancer (HCC)  10/18/2021 Procedure   EGD  performed under the care of Dr. Bosie Clos  Findings:  -A large, ulcerated, partially circumferential (involving two thirds of the lumen circumference) mass with no bleeding and stigmata of recent bleeding was found in the gastric antrum, at the pylorus and in the prepyloric region of the stomach. -Segmental severe inflammation characterized by congestion (edema) and erythema was found in the gastric antrum.   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 Negative for dysplasia.  Negative for helicobacter pylori  Mismatch Repair (MMR) Protein Imunohistochemistry (IHC): IHC Expression Result: MLH1: Preserved nuclear expression. MSH2: Preserved nuclear expression. MSH6: Preserved nuclear expression. PMS2: Preserved nuclear expression. Interpretation: NORMAL   10/31/2021 Initial Diagnosis   Gastric cancer (HCC)   11/01/2021 Tumor Marker   CEA = 9,604.54 (^)   11/06/2021 Procedure   Upper EUS, Dr. Dulce Sellar  Impression: - Normal esophagus. - A small amount of food (residue) in the stomach. - Congested, friable (with contact bleeding) and ulcerated mucosa in the prepyloric region of the stomach. - Normal examined duodenum. - There was no evidence of significant pathology in the left lobe of the liver. - Many abnormal lymph nodes (over 10) were visualized in the gastrohepatic ligament (level 18), celiac region (level 20), perigastric region, peripancreatic region and aortocaval region. - Wall thickening was seen in the prepyloric region of the stomach. The thickening  appeared to be primarily within the deep mucosa (Layer 2) but extended through  the muscularis propria. - Overall constellation of findings consistent with at least T3 N3 Mx (at least stage III) gastric adenocarcinoma.   11/08/2021 Imaging   EXAM: CT CHEST, ABDOMEN, AND PELVIS WITH CONTRAST  IMPRESSION: 1. Similar irregular wall thickening of the gastric antrum, consistent with patient's known primary gastric neoplasm. 2. Increased size of the upper abdominal lymph nodes, concerning for worsening nodal disease involvement. 3. Stable left adrenal nodule common nonspecific possibly a metastatic lesion or adenoma. 4. No evidence of metastatic disease in the chest. 5. Left-sided colonic diverticulosis without findings of acute diverticulitis. 6. Enlarged prostate gland. 7.  Aortic Atherosclerosis (ICD10-I70.0).   11/22/2021 - 04/26/2022 Chemotherapy   Patient is on Treatment Plan : GASTROESOPHAGEAL FLOT q14d X 4 cycles     11/27/2021 Imaging    IMPRESSION: 1. The mass within the gastric antrum is mildly decreased in size when compared with 11/08/2021. There is persistent FDG uptake associated with this mass compatible with residual tumor. 2. Persistent FDG avid lymph nodes within the gastrohepatic ligament, portacaval region, and mesentery. These are mildly decreased in size when compared with 11/08/2021. 3. Tracer avid left supraclavicular lymph node is also mildly decreased in size when compared with 11/08/2021. 4. Indeterminate left adrenal gland nodule with mild FDG uptake. This may represent a lipid poor adenoma. Metastatic disease not exclude. 5. Diffuse increased uptake throughout the bone marrow is favored to represent treatment related change. 6.  Aortic Atherosclerosis (ICD10-I70.0).     02/07/2022 Imaging    IMPRESSION: 1. No substantial change in hypermetabolism associated with the distal gastric lesion. Although difficult to discern on noncontrast CT imaging, the  soft tissue component appears to be decreased since the prior PET-CT. 2. Interval resolution of the hypermetabolic left supraclavicular lymph node. 3. Interval marked decrease of the hypermetabolic gastrohepatic ligament lymphadenopathy. Similar decrease in size and hypermetabolism associated with index nodes identified previously in the 4. porta hepatis and hypogastric region. 5. Stable left adrenal nodule with slight hypermetabolism. Nonspecific finding could represent lipid poor adenoma or metastatic deposit. 6. No new suspicious hypermetabolic disease on today's study. 7.  Aortic Atherosclerosis (ICD10-I70.0).   07/25/2022 - 08/15/2022 Chemotherapy   Patient is on Treatment Plan : GASTROESOPHAGEAL Ramucirumab D1, 15 + Paclitaxel D1,8,15 q28d     08/29/2022 - 01/24/2023 Chemotherapy   Patient is on Treatment Plan : COLORECTAL FOLFIRI q14d     02/05/2023 -  Chemotherapy   Patient is on Treatment Plan : GASTROESOPHAGEAL Ramucirumab D1, 15 + Paclitaxel D1,8,15 q28d        Discussed the use of AI scribe software for clinical note transcription with the patient, who gave verbal consent to proceed.  History of Present Illness   Mr. Jonathan Maynard, a 53 year old male with a known diagnosis of metastatic gastric cancer, presents for a follow-up visit. He reports experiencing fatigue and mouth pain following his last treatment, which lasted for about two days, necessitating time off from work. He also expresses concern about neuropathy, which he states is slightly improved with a 300mg  medication. He denies any problems with hand function. He has been using ice intermittently to manage the neuropathy, although he finds it uncomfortable. He has been able to return to work and is taking his condition 'day by day.' He also mentions that he has received approval for some treatments but is unsure which ones.         All other systems were reviewed with the patient and are negative.  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    SURGICAL HISTORY: Past Surgical History:  Procedure Laterality Date   BIOPSY  04/12/2021   Procedure: BIOPSY;  Surgeon: Charlott Rakes, MD;  Location: [facility];  Service: Gastroenterology;;   ESOPHAGOGASTRODUODENOSCOPY N/A 04/12/2021   Procedure: ESOPHAGOGASTRODUODENOSCOPY (EGD);  Surgeon: Charlott Rakes, MD;  Location: [facility];  Service: Gastroenterology;  Laterality: N/A;   ESOPHAGOGASTRODUODENOSCOPY N/A 11/06/2021   Procedure: ESOPHAGOGASTRODUODENOSCOPY (EGD);  Surgeon: Willis Modena, MD;  Location: [facility];  Service: Gastroenterology;  Laterality: N/A;   GASTRECTOMY N/A 05/20/2022   Procedure: OPEN PARTIAL GASTRECTOMY WITH GASTROJEJUNAL ANASTOMOSIS;  Surgeon: Sheliah Hatch, De Blanch, MD;  Location: [facility];  Service: General;  Laterality: N/A;   HERNIA REPAIR Left    inguinal   IR IMAGING GUIDED PORT INSERTION  11/15/2021   LAPAROSCOPY N/A 05/20/2022   Procedure: LAPAROSCOPY DIAGNOSTIC;  Surgeon: Rodman Pickle, MD;  Location: [facility];  Service: General;  Laterality: N/A;   UPPER ESOPHAGEAL ENDOSCOPIC ULTRASOUND (EUS) Bilateral 11/06/2021   Procedure: UPPER ESOPHAGEAL ENDOSCOPIC ULTRASOUND (EUS);  Surgeon: Willis Modena, MD;  Location: [facility];  Service: Gastroenterology;  Laterality: Bilateral;    I have reviewed the social history and family history with the patient and they are unchanged from previous note.  ALLERGIES:  has no known allergies.  MEDICATIONS:  Current Outpatient Medications  Medication Sig Dispense Refill   acetaminophen (TYLENOL) 500 MG tablet Take 1 tablet (500 mg total) by mouth 2 (two) times daily. 60 tablet 3   gabapentin  (NEURONTIN) 100 MG capsule Take 1-2 capsules (100-200 mg total) by mouth 4 (four) times daily as needed. OK to increase to 2 or 3 capsules in a few weeks if tolerates well 240 capsule 2   gabapentin (NEURONTIN) 300 MG capsule Take 1 capsule (300 mg total) by mouth 3 (three) times daily. 90 capsule 2   ondansetron (ZOFRAN-ODT) 4 MG disintegrating tablet Dissolve 1 tablet (4 mg total) by mouth every 6 (six) hours as needed for nausea or vomiting. 30 tablet 1   pantoprazole (PROTONIX) 40 MG tablet Take 1 tablet (40 mg) by mouth 2 times daily. 60 tablet 6   prochlorperazine (COMPAZINE) 10 MG tablet Take 1 tablet (10 mg total) by mouth every 6 (six) hours as needed for nausea or vomiting. 30 tablet 2   No current facility-administered medications for this visit.    PHYSICAL EXAMINATION: ECOG PERFORMANCE STATUS: 1 - Symptomatic but completely ambulatory  Vitals:   02/19/23 0824  BP: 131/76  Pulse: 81  Resp: 16  Temp: 97.7 F (36.5 C)  SpO2: 100%   Wt Readings from Last 3 Encounters:  02/19/23 186 lb 6.4 oz (84.6 kg)  02/05/23 185 lb 1.6 oz (84 kg)  01/22/23 185 lb (83.9 kg)     GENERAL:alert, no distress and comfortable SKIN: skin color, texture, turgor are normal, no rashes or significant lesions EYES: normal, Conjunctiva are pink and non-injected, sclera clear NECK: supple, thyroid normal size, non-tender, without nodularity LYMPH:  no palpable lymphadenopathy in the cervical, axillary  LUNGS: clear to auscultation and percussion with normal breathing effort HEART: regular rate & rhythm and no murmurs and no lower extremity edema ABDOMEN:abdomen soft, non-tender and normal bowel sounds Musculoskeletal:no  cyanosis of digits and no clubbing  NEURO: alert & oriented x 3 with fluent speech, no focal motor/sensory deficits   LABORATORY DATA:  I have reviewed the data as listed    Latest Ref Rng & Units 02/19/2023    8:04 AM 02/05/2023    7:34 AM 01/22/2023    8:09 AM  CBC  WBC 4.0  - 10.5 K/uL 4.4  6.2  4.8   Hemoglobin 13.0 - 17.0 g/dL 62.9  52.8  41.3   Hematocrit 39.0 - 52.0 % 31.4  34.5  32.6   Platelets 150 - 400 K/uL 247  226  234         Latest Ref Rng & Units 02/19/2023    8:04 AM 02/05/2023    7:34 AM 01/22/2023    8:09 AM  CMP  Glucose 70 - 99 mg/dL 90  244  010   BUN 6 - 20 mg/dL 9  9  9    Creatinine 0.61 - 1.24 mg/dL 2.72  5.36  6.44   Sodium 135 - 145 mmol/L 144  140  142   Potassium 3.5 - 5.1 mmol/L 4.0  3.8  3.8   Chloride 98 - 111 mmol/L 113  108  109   CO2 22 - 32 mmol/L 27  28  28    Calcium 8.9 - 10.3 mg/dL 8.6  9.2  9.0   Total Protein 6.5 - 8.1 g/dL 6.2  6.7  6.3   Total Bilirubin 0.0 - 1.2 mg/dL 0.3  0.4  0.3   Alkaline Phos 38 - 126 U/L 96  101  91   AST 15 - 41 U/L 15  17  14    ALT 0 - 44 U/L 9  12  9        RADIOGRAPHIC STUDIES: I have personally reviewed the radiological images as listed and agreed with the findings in the report. No results found.    No orders of the defined types were placed in this encounter.  All questions were answered. The patient knows to call the clinic with any problems, questions or concerns. No barriers to learning was detected. The total time spent in the appointment was 25 minutes.     Malachy Mood, MD 02/19/2023

## 2023-02-19 NOTE — Patient Instructions (Signed)
[facility] - A DEPT OF [facility]  Discharge Instructions: Thank you for choosing [facility] to provide your oncology and hematology care.   If you have a lab appointment with the Cancer Center, please go directly to the Cancer Center and check in at the registration area.   Wear comfortable clothing and clothing appropriate for easy access to any Portacath or PICC line.   We strive to give you quality time with your provider. You may need to reschedule your appointment if you arrive late (15 or more minutes).  Arriving late affects you and other patients whose appointments are after yours.  Also, if you miss three or more appointments without notifying the office, you may be dismissed from the clinic at the provider's discretion.      For prescription refill requests, have your pharmacy contact our office and allow 72 hours for refills to be completed.    Today you received the following chemotherapy and/or immunotherapy agents: Cyramza, Opdivo, Taxol      To help prevent nausea and vomiting after your treatment, we encourage you to take your nausea medication as directed.  BELOW ARE SYMPTOMS THAT SHOULD BE REPORTED IMMEDIATELY: *FEVER GREATER THAN 100.4 F (38 C) OR HIGHER *CHILLS OR SWEATING *NAUSEA AND VOMITING THAT IS NOT CONTROLLED WITH YOUR NAUSEA MEDICATION *UNUSUAL SHORTNESS OF BREATH *UNUSUAL BRUISING OR BLEEDING *URINARY PROBLEMS (pain or burning when urinating, or frequent urination) *BOWEL PROBLEMS (unusual diarrhea, constipation, pain near the anus) TENDERNESS IN MOUTH AND THROAT WITH OR WITHOUT PRESENCE OF ULCERS (sore throat, sores in mouth, or a toothache) UNUSUAL RASH, SWELLING OR PAIN  UNUSUAL VAGINAL DISCHARGE OR ITCHING   Items with * indicate a potential emergency and should be followed up as soon as possible or go to the Emergency Department if any problems should occur.  Please show the CHEMOTHERAPY ALERT CARD or  IMMUNOTHERAPY ALERT CARD at check-in to the Emergency Department and triage nurse.  Should you have questions after your visit or need to cancel or reschedule your appointment, please contact [facility] - A DEPT OF [facility]  Dept: [phone]  and follow the prompts.  Office hours are 8:00 a.m. to 4:30 p.m. Monday - Friday. Please note that voicemails left after 4:00 p.m. may not be returned until the following business day.  We are closed weekends and major holidays. You have access to a nurse at all times for urgent questions. Please call the main number to the clinic Dept: [phone] and follow the prompts.   For any non-urgent questions, you may also contact your provider using MyChart. We now offer e-Visits for anyone 10 and older to request care online for non-urgent symptoms. For details visit [email or web address].   Also download the MyChart app! Go to the app store, search "MyChart", open the app, select [facility], and log in with your MyChart username and password.

## 2023-02-19 NOTE — Assessment & Plan Note (Signed)
cT3N3Mx, with hypermetabolic left supraclavicular node, and indeterminate adrenal nodule, MMR normal ypT3ypN3a, liver and nodes recurrence in 07/2022 -diagnosed 10/18/21 by EGD for 6 month f/u of gastric ulcers, showed mucosal intramucosal adenocarcinoma. MMR normal. -baseline CEA 11/01/21 significantly elevated at 1,989.58. -EUS on 11/06/21 by Dr. Dulce Sellar, staged as T3 N3 (at least stage III) -staging CT CAP 11/08/21 showed: stable gastric antrum wall thickening; increased size of upper abdominal lymph nodes; stable left adrenal nodule, 1.9 cm. -PET scan showed FDG avid lymph nodes within the gastrohepatic ligament, portacaval region, and mesentery. Tracer avid left supraclavicular lymph node and indeterminate left adrenal gland nodule with mild FDG uptake. -he underwent [place] node biopsy on 1/17 which was negative for malignant cells, but no lymphoid tissue seen on biopsy  -repeated PET on 02/07/22 after 3 months neoadjuvant chemo showed resolved left supraclavicular lymph node, and significant improvement in regional lymph nodes and the primary tumor, stable and indeterminate left adrenal nodule.  -CT adrenal protocol showed the left adrenal nodule is likely benign adenoma, no biopsy is needed. -Completed neoadjvant chemo FLOT s/p 12 cycles 11/22/21 - 04/27/2022 -restaging PET on 05/16/22 showed significantly hypermetabolic activity at the primary gastric cancer in pylorus, and a small hypermetabolic mesenteric lymph node, no other evidence of metastasis. surgeon Dr. Sheliah Hatch aware of PET findings -S/p distal gastrectomy 05/20/22, path showed ypT3N3a with clear margins, 7/18 + LNs, and lymphovascular invasion identified. There was some treatment effect in a few lymph nodes nut not in the primary tumor -due to rising tumor marker, he underwent PET scan on July 07, 2022, which unfortunately showed hypermetabolic new adenopathy in right hilar and mesentery, diffuse liver metastasis, consistent with metastatic  recurrence.  I personally reviewed the PET scan images with patient -Unfortunately his disease is not curable at this stage. -The goal of chemotherapy is palliative, to prolong his life -I discussed his next generation sequencing foundation one result, which showed EGFR amplification, no other targetable mutations.  His tumor was previously tested for HER2 which was negative (0-1+).  PD-L1 CPS score was 2%, no significant benefit of immunotherapy.  -He started paclitaxel and ramucirumab on 07/25/2022  He developed worsening neuropathy quickly, and I had to switch his treatment to FOLFIRI on 08/29/2022, every 2 weeks, he is overall tolerating well. -restaging CT 01/19/2023 showed disease progression with new pathologic right paratracheal mediastinal lymph nodes as well as increasing size of several liver metastases. -I changed his chemo to taxol and ramucizumab, every 2 weeks per his request

## 2023-02-21 ENCOUNTER — Other Ambulatory Visit: Payer: Self-pay

## 2023-02-22 ENCOUNTER — Other Ambulatory Visit: Payer: Self-pay

## 2023-02-23 ENCOUNTER — Other Ambulatory Visit: Payer: Self-pay

## 2023-03-04 NOTE — Assessment & Plan Note (Signed)
 cT3N3Mx, with hypermetabolic left supraclavicular node, and indeterminate adrenal nodule, MMR normal ypT3ypN3a, liver and nodes recurrence in 07/2022 -diagnosed 10/18/21 by EGD for 6 month f/u of gastric ulcers, showed mucosal intramucosal adenocarcinoma. MMR normal. -baseline CEA 11/01/21 significantly elevated at 1,989.58. -EUS on 11/06/21 by Dr. Dulce Sellar, staged as T3 N3 (at least stage III) -staging CT CAP 11/08/21 showed: stable gastric antrum wall thickening; increased size of upper abdominal lymph nodes; stable left adrenal nodule, 1.9 cm. -PET scan showed FDG avid lymph nodes within the gastrohepatic ligament, portacaval region, and mesentery. Tracer avid left supraclavicular lymph node and indeterminate left adrenal gland nodule with mild FDG uptake. -he underwent [place] node biopsy on 1/17 which was negative for malignant cells, but no lymphoid tissue seen on biopsy  -repeated PET on 02/07/22 after 3 months neoadjuvant chemo showed resolved left supraclavicular lymph node, and significant improvement in regional lymph nodes and the primary tumor, stable and indeterminate left adrenal nodule.  -CT adrenal protocol showed the left adrenal nodule is likely benign adenoma, no biopsy is needed. -Completed neoadjvant chemo FLOT s/p 12 cycles 11/22/21 - 04/27/2022 -restaging PET on 05/16/22 showed significantly hypermetabolic activity at the primary gastric cancer in pylorus, and a small hypermetabolic mesenteric lymph node, no other evidence of metastasis. surgeon Dr. Sheliah Hatch aware of PET findings -S/p distal gastrectomy 05/20/22, path showed ypT3N3a with clear margins, 7/18 + LNs, and lymphovascular invasion identified. There was some treatment effect in a few lymph nodes nut not in the primary tumor -due to rising tumor marker, he underwent PET scan on July 07, 2022, which unfortunately showed hypermetabolic new adenopathy in right hilar and mesentery, diffuse liver metastasis, consistent with metastatic  recurrence.  I personally reviewed the PET scan images with patient -Unfortunately his disease is not curable at this stage. -The goal of chemotherapy is palliative, to prolong his life -I discussed his next generation sequencing foundation one result, which showed EGFR amplification, no other targetable mutations.  His tumor was previously tested for HER2 which was negative (0-1+).  PD-L1 CPS score was 2%, no significant benefit of immunotherapy.  -He started paclitaxel and ramucirumab on 07/25/2022  He developed worsening neuropathy quickly, and I had to switch his treatment to FOLFIRI on 08/29/2022, every 2 weeks, he is overall tolerating well. -restaging CT 01/19/2023 showed disease progression with new pathologic right paratracheal mediastinal lymph nodes as well as increasing size of several liver metastases. -I changed his chemo to taxol and ramucizumab, every 2 weeks per his request

## 2023-03-05 ENCOUNTER — Inpatient Hospital Stay (HOSPITAL_BASED_OUTPATIENT_CLINIC_OR_DEPARTMENT_OTHER): Payer: BC Managed Care – PPO | Admitting: Hematology

## 2023-03-05 ENCOUNTER — Inpatient Hospital Stay: Payer: BC Managed Care – PPO

## 2023-03-05 ENCOUNTER — Encounter: Payer: Self-pay | Admitting: Hematology

## 2023-03-05 VITALS — BP 127/80 | HR 80 | Temp 98.0°F | Resp 17 | Wt 181.8 lb

## 2023-03-05 VITALS — BP 139/99 | HR 65 | Temp 98.2°F | Resp 18

## 2023-03-05 DIAGNOSIS — Z95828 Presence of other vascular implants and grafts: Secondary | ICD-10-CM

## 2023-03-05 DIAGNOSIS — C163 Malignant neoplasm of pyloric antrum: Secondary | ICD-10-CM

## 2023-03-05 DIAGNOSIS — Z5112 Encounter for antineoplastic immunotherapy: Secondary | ICD-10-CM | POA: Diagnosis not present

## 2023-03-05 LAB — CMP (CANCER CENTER ONLY)
ALT: 9 U/L (ref 0–44)
AST: 16 U/L (ref 15–41)
Albumin: 3.8 g/dL (ref 3.5–5.0)
Alkaline Phosphatase: 105 U/L (ref 38–126)
Anion gap: 4 — ABNORMAL LOW (ref 5–15)
BUN: 6 mg/dL (ref 6–20)
CO2: 27 mmol/L (ref 22–32)
Calcium: 8.9 mg/dL (ref 8.9–10.3)
Chloride: 108 mmol/L (ref 98–111)
Creatinine: 0.8 mg/dL (ref 0.61–1.24)
GFR, Estimated: >60 mL/min (ref >60)
Glucose, Bld: 106 mg/dL — ABNORMAL HIGH (ref 70–99)
Potassium: 3.8 mmol/L (ref 3.5–5.1)
Sodium: 139 mmol/L (ref 135–145)
Total Bilirubin: 0.4 mg/dL (ref 0.0–1.2)
Total Protein: 6.9 g/dL (ref 6.5–8.1)

## 2023-03-05 LAB — CBC WITH DIFFERENTIAL (CANCER CENTER ONLY)
Abs Immature Granulocytes: 0.01 10*3/uL (ref 0.00–0.07)
Basophils Absolute: 0 10*3/uL (ref 0.0–0.1)
Basophils Relative: 1 %
Eosinophils Absolute: 0.1 10*3/uL (ref 0.0–0.5)
Eosinophils Relative: 3 %
HCT: 31.6 % — ABNORMAL LOW (ref 39.0–52.0)
Hemoglobin: 10 g/dL — ABNORMAL LOW (ref 13.0–17.0)
Immature Granulocytes: 0 %
Lymphocytes Relative: 34 %
Lymphs Abs: 1.4 10*3/uL (ref 0.7–4.0)
MCH: 27.2 pg (ref 26.0–34.0)
MCHC: 31.6 g/dL (ref 30.0–36.0)
MCV: 86.1 fL (ref 80.0–100.0)
Monocytes Absolute: 0.6 10*3/uL (ref 0.1–1.0)
Monocytes Relative: 16 %
Neutro Abs: 1.8 10*3/uL (ref 1.7–7.7)
Neutrophils Relative %: 46 %
Platelet Count: 244 10*3/uL (ref 150–400)
RBC: 3.67 MIL/uL — ABNORMAL LOW (ref 4.22–5.81)
RDW: 18.7 % — ABNORMAL HIGH (ref 11.5–15.5)
WBC Count: 4 10*3/uL (ref 4.0–10.5)
nRBC: 0 % (ref 0.0–0.2)

## 2023-03-05 LAB — CEA (ACCESS): CEA (CHCC): 613.12 ng/mL — ABNORMAL HIGH (ref 0.00–5.00)

## 2023-03-05 MED ORDER — SODIUM CHLORIDE 0.9 % IV SOLN
240.0000 mg | Freq: Once | INTRAVENOUS | Status: AC
  Administered 2023-03-05: 240 mg via INTRAVENOUS
  Filled 2023-03-05: qty 24

## 2023-03-05 MED ORDER — HEPARIN SOD (PORK) LOCK FLUSH 100 UNIT/ML IV SOLN
500.0000 [IU] | Freq: Once | INTRAVENOUS | Status: AC | PRN
  Administered 2023-03-05: 500 [IU]

## 2023-03-05 MED ORDER — SODIUM CHLORIDE 0.9% FLUSH
10.0000 mL | Freq: Once | INTRAVENOUS | Status: AC
  Administered 2023-03-05: 10 mL

## 2023-03-05 MED ORDER — PACLITAXEL CHEMO INJECTION 300 MG/50ML
120.0000 mg/m2 | Freq: Once | INTRAVENOUS | Status: AC
  Administered 2023-03-05: 234 mg via INTRAVENOUS
  Filled 2023-03-05: qty 39

## 2023-03-05 MED ORDER — DIPHENHYDRAMINE HCL 50 MG/ML IJ SOLN
25.0000 mg | Freq: Once | INTRAMUSCULAR | Status: AC
  Administered 2023-03-05: 25 mg via INTRAVENOUS
  Filled 2023-03-05: qty 1

## 2023-03-05 MED ORDER — SODIUM CHLORIDE 0.9 % IV SOLN
8.0000 mg/kg | Freq: Once | INTRAVENOUS | Status: AC
  Administered 2023-03-05: 700 mg via INTRAVENOUS
  Filled 2023-03-05: qty 20

## 2023-03-05 MED ORDER — SODIUM CHLORIDE 0.9% FLUSH
10.0000 mL | INTRAVENOUS | Status: DC | PRN
  Administered 2023-03-05: 10 mL

## 2023-03-05 MED ORDER — ACETAMINOPHEN 325 MG PO TABS
650.0000 mg | ORAL_TABLET | Freq: Once | ORAL | Status: AC
  Administered 2023-03-05: 650 mg via ORAL
  Filled 2023-03-05: qty 2

## 2023-03-05 MED ORDER — SODIUM CHLORIDE 0.9 % IV SOLN
INTRAVENOUS | Status: DC
Start: 2023-03-05 — End: 2023-03-05

## 2023-03-05 MED ORDER — FAMOTIDINE IN NACL 20-0.9 MG/50ML-% IV SOLN
20.0000 mg | Freq: Once | INTRAVENOUS | Status: AC
  Administered 2023-03-05: 20 mg via INTRAVENOUS
  Filled 2023-03-05: qty 50

## 2023-03-05 NOTE — Progress Notes (Signed)
 [facility]   [phone] Fax:([phone]   Clinic Follow up Note   Patient Care Team: Pcp, No as PCP - Serita Grit, MD as Consulting Physician (Hematology and Oncology)  Date of Service:  03/05/2023  CHIEF COMPLAINT: f/u of gastric cancer  CURRENT THERAPY:  Paclitaxel and ramucirumab every 2 weeks  Oncology History   Gastric cancer (HCC) cT3N3Mx, with hypermetabolic left supraclavicular node, and indeterminate adrenal nodule, MMR normal ypT3ypN3a, liver and nodes recurrence in 07/2022 -diagnosed 10/18/21 by EGD for 6 month f/u of gastric ulcers, showed mucosal intramucosal adenocarcinoma. MMR normal. -baseline CEA 11/01/21 significantly elevated at 1,989.58. -EUS on 11/06/21 by Dr. Dulce Sellar, staged as T3 N3 (at least stage III) -staging CT CAP 11/08/21 showed: stable gastric antrum wall thickening; increased size of upper abdominal lymph nodes; stable left adrenal nodule, 1.9 cm. -PET scan showed FDG avid lymph nodes within the gastrohepatic ligament, portacaval region, and mesentery. Tracer avid left supraclavicular lymph node and indeterminate left adrenal gland nodule with mild FDG uptake. -he underwent [place] node biopsy on 1/17 which was negative for malignant cells, but no lymphoid tissue seen on biopsy  -repeated PET on 02/07/22 after 3 months neoadjuvant chemo showed resolved left supraclavicular lymph node, and significant improvement in regional lymph nodes and the primary tumor, stable and indeterminate left adrenal nodule.  -CT adrenal protocol showed the left adrenal nodule is likely benign adenoma, no biopsy is needed. -Completed neoadjvant chemo FLOT s/p 12 cycles 11/22/21 - 04/27/2022 -restaging PET on 05/16/22 showed significantly hypermetabolic activity at the primary gastric cancer in pylorus, and a small hypermetabolic mesenteric lymph node, no other evidence of metastasis. surgeon Dr. Sheliah Hatch aware of PET findings -S/p distal gastrectomy  05/20/22, path showed ypT3N3a with clear margins, 7/18 + LNs, and lymphovascular invasion identified. There was some treatment effect in a few lymph nodes nut not in the primary tumor -due to rising tumor marker, he underwent PET scan on July 07, 2022, which unfortunately showed hypermetabolic new adenopathy in right hilar and mesentery, diffuse liver metastasis, consistent with metastatic recurrence.  I personally reviewed the PET scan images with patient -Unfortunately his disease is not curable at this stage. -The goal of chemotherapy is palliative, to prolong his life -I discussed his next generation sequencing foundation one result, which showed EGFR amplification, no other targetable mutations.  His tumor was previously tested for HER2 which was negative (0-1+).  PD-L1 CPS score was 2%, no significant benefit of immunotherapy.  -He started paclitaxel and ramucirumab on 07/25/2022  He developed worsening neuropathy quickly, and I had to switch his treatment to FOLFIRI on 08/29/2022, every 2 weeks, he is overall tolerating well. -restaging CT 01/19/2023 showed disease progression with new pathologic right paratracheal mediastinal lymph nodes as well as increasing size of several liver metastases. -I changed his chemo to taxol and ramucizumab, every 2 weeks per his request     Assessment and Plan    Metastatic Gastric Cancer Undergoing third cycle of chemotherapy. Reports post-infusion fatigue, managed with hydration. Neuropathy symptoms improved with gabapentin 300 mg, minimal numbness or tingling. Weight loss of five pounds since last treatment, but maintaining good nutrition. Hemoglobin stable at 10, no transfusion needed. Tumor markers slightly decreased; scan planned after fifth or sixth cycle to assess response. - Administer chemotherapy infusion at 10:30 AM - Monitor weight and encourage increased caloric intake - Continue gabapentin 300 mg for neuropathy - Schedule follow-up scan after the  fifth or sixth treatment cycle - Follow-up  appointment in two weeks  Chemotherapy-Induced Neuropathy Slight improvement with gabapentin 300 mg. Minimal numbness or tingling, infrequent dropping of objects. Uses ice packs occasionally. - Continue gabapentin 300 mg - Use ice packs as needed for neuropathy  Chemo induced anemia -Overall stable, hemoglobin 10.0 today, -Follow-up closely.  Plan -Lab reviewed, adequate for treatment, will proceed to therapy today and continue every 2 weeks - Follow-up appointment in two weeks -Plan to repeat CT scan in mid April      SUMMARY OF ONCOLOGIC HISTORY: Oncology History Overview Note   Cancer Staging  Gastric cancer [facility]) Staging form: Stomach, AJCC 8th Edition - Clinical stage from 10/18/2021: Stage III (cT3, cN3a, cM0) - Signed by Malachy Mood, MD on 11/10/2021     Gastric cancer (HCC)  10/18/2021 Procedure   EGD performed under the care of Dr. Bosie Clos  Findings:  -A large, ulcerated, partially circumferential (involving two thirds of the lumen circumference) mass with no bleeding and stigmata of recent bleeding was found in the gastric antrum, at the pylorus and in the prepyloric region of the stomach. -Segmental severe inflammation characterized by congestion (edema) and erythema was found in the gastric antrum.   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 Negative for dysplasia.  Negative for helicobacter pylori  Mismatch Repair (MMR) Protein Imunohistochemistry (IHC): IHC Expression Result: MLH1: Preserved  nuclear expression. MSH2: Preserved nuclear expression. MSH6: Preserved nuclear expression. PMS2: Preserved nuclear expression. Interpretation: NORMAL   10/31/2021 Initial Diagnosis   Gastric cancer (HCC)   11/01/2021 Tumor Marker   CEA = 1,610.96 (^)   11/06/2021 Procedure   Upper EUS, Dr. Dulce Sellar  Impression: - Normal esophagus. - A small amount of food (residue) in the stomach. - Congested, friable (with contact bleeding) and ulcerated mucosa in the prepyloric region of the stomach. - Normal examined duodenum. - There was no evidence of significant pathology in the left lobe of the liver. - Many abnormal lymph nodes (over 10) were visualized in the gastrohepatic ligament (level 18), celiac region (level 20), perigastric region, peripancreatic region and aortocaval region. - Wall thickening was seen in the prepyloric region of the stomach. The thickening appeared to be primarily within the deep mucosa (Layer 2) but extended through the muscularis propria. - Overall constellation of findings consistent with at least T3 N3 Mx (at least stage III) gastric adenocarcinoma.   11/08/2021 Imaging   EXAM: CT CHEST, ABDOMEN, AND PELVIS WITH CONTRAST  IMPRESSION: 1. Similar irregular wall thickening of the gastric antrum, consistent with patient's known primary gastric neoplasm. 2. Increased size of the upper abdominal lymph nodes, concerning for worsening nodal disease involvement. 3. Stable left adrenal nodule common nonspecific possibly a metastatic lesion or adenoma. 4. No evidence of metastatic disease in the chest. 5. Left-sided colonic diverticulosis without findings of acute diverticulitis. 6. Enlarged prostate gland. 7.  Aortic Atherosclerosis (ICD10-I70.0).   11/22/2021 - 04/26/2022 Chemotherapy   Patient is on Treatment Plan : GASTROESOPHAGEAL FLOT q14d X 4 cycles     11/27/2021 Imaging    IMPRESSION: 1. The mass within the gastric antrum is mildly decreased in size when  compared with 11/08/2021. There is  persistent FDG uptake associated with this mass compatible with residual tumor. 2. Persistent FDG avid lymph nodes within the gastrohepatic ligament, portacaval region, and mesentery. These are mildly decreased in size when compared with 11/08/2021. 3. Tracer avid left supraclavicular lymph node is also mildly decreased in size when compared with 11/08/2021. 4. Indeterminate left adrenal gland nodule with mild FDG uptake. This may represent a lipid poor adenoma. Metastatic disease not exclude. 5. Diffuse increased uptake throughout the bone marrow is favored to represent treatment related change. 6.  Aortic Atherosclerosis (ICD10-I70.0).     02/07/2022 Imaging    IMPRESSION: 1. No substantial change in hypermetabolism associated with the distal gastric lesion. Although difficult to discern on noncontrast CT imaging, the soft tissue component appears to be decreased since the prior PET-CT. 2. Interval resolution of the hypermetabolic left supraclavicular lymph node. 3. Interval marked decrease of the hypermetabolic gastrohepatic ligament lymphadenopathy. Similar decrease in size and hypermetabolism associated with index nodes identified previously in the 4. porta hepatis and hypogastric region. 5. Stable left adrenal nodule with slight hypermetabolism. Nonspecific finding could represent lipid poor adenoma or metastatic deposit. 6. No new suspicious hypermetabolic disease on today's study. 7.  Aortic Atherosclerosis (ICD10-I70.0).   07/25/2022 - 08/15/2022 Chemotherapy   Patient is on Treatment Plan : GASTROESOPHAGEAL Ramucirumab D1, 15 + Paclitaxel D1,8,15 q28d     08/29/2022 - 01/24/2023 Chemotherapy   Patient is on Treatment Plan : COLORECTAL FOLFIRI q14d     02/05/2023 -  Chemotherapy   Patient is on Treatment Plan : GASTROESOPHAGEAL Ramucirumab D1, 15 + Paclitaxel D1,8,15 q28d        Discussed the use of AI scribe software for clinical  note transcription with the patient, who gave verbal consent to proceed.  History of Present Illness   The patient, a 53 year old with metastatic gastric cancer, presents for a follow-up visit. He reports feeling fine overall and has been able to continue working, taking only one day off after his last treatment. He has been hydrating well post-infusion and has not experienced significant fatigue.  The patient's neuropathy has improved slightly, and he is managing it with gabapentin 300mg  and ice packs. He denies dropping things frequently and reports only occasional tingling in his feet. He has been able to maintain his mobility and walk a lot.  He denies any bowel issues and reports a good appetite. However, he has lost about five pounds since his last treatment. He is aware of the need to maintain his weight and plans to increase his food intake.  The patient also mentions that he is due for his third cycle of chemotherapy. He has been monitoring his tumor markers and is scheduled for a scan after his fifth or sixth treatment.         All other systems were reviewed with the patient and are negative.  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    SURGICAL HISTORY: Past Surgical History:  Procedure Laterality Date   BIOPSY  04/12/2021   Procedure: BIOPSY;  Surgeon: Charlott Rakes, MD;  Location: [facility];  Service: Gastroenterology;;   ESOPHAGOGASTRODUODENOSCOPY N/A 04/12/2021   Procedure: ESOPHAGOGASTRODUODENOSCOPY (EGD);  Surgeon: Charlott Rakes, MD;  Location:  [facility];  Service: Gastroenterology;  Laterality: N/A;   ESOPHAGOGASTRODUODENOSCOPY N/A 11/06/2021   Procedure:  ESOPHAGOGASTRODUODENOSCOPY (EGD);  Surgeon: Willis Modena, MD;  Location: [facility];  Service: Gastroenterology;  Laterality: N/A;   GASTRECTOMY N/A 05/20/2022   Procedure: OPEN PARTIAL GASTRECTOMY WITH GASTROJEJUNAL ANASTOMOSIS;  Surgeon: Sheliah Hatch, De Blanch, MD;  Location: [facility];  Service: General;  Laterality: N/A;   HERNIA REPAIR Left    inguinal   IR IMAGING GUIDED PORT INSERTION  11/15/2021   LAPAROSCOPY N/A 05/20/2022   Procedure: LAPAROSCOPY DIAGNOSTIC;  Surgeon: Rodman Pickle, MD;  Location: [facility];  Service: General;  Laterality: N/A;   UPPER ESOPHAGEAL ENDOSCOPIC ULTRASOUND (EUS) Bilateral 11/06/2021   Procedure: UPPER ESOPHAGEAL ENDOSCOPIC ULTRASOUND (EUS);  Surgeon: Willis Modena, MD;  Location: [facility];  Service: Gastroenterology;  Laterality: Bilateral;    I have reviewed the social history and family history with the patient and they are unchanged from previous note.  ALLERGIES:  has no known allergies.  MEDICATIONS:  Current Outpatient Medications  Medication Sig Dispense Refill   acetaminophen (TYLENOL) 500 MG tablet Take 1 tablet (500 mg total) by mouth 2 (two) times daily. 60 tablet 3   gabapentin (NEURONTIN) 100 MG capsule Take 1-2 capsules (100-200 mg total) by mouth 4 (four) times daily as needed. OK to increase to 2 or 3 capsules in a few weeks if tolerates well 240 capsule 2   gabapentin (NEURONTIN) 300 MG capsule Take 1 capsule (300 mg total) by mouth 3 (three) times daily. 90 capsule 2   ondansetron (ZOFRAN-ODT) 4 MG disintegrating tablet Dissolve 1 tablet (4 mg total) by mouth every 6 (six) hours as needed for nausea or vomiting. 30 tablet 1   pantoprazole (PROTONIX) 40 MG tablet Take 1 tablet (40 mg) by mouth 2 times daily. 60 tablet 6   prochlorperazine (COMPAZINE) 10 MG tablet Take 1 tablet (10 mg total) by mouth every 6 (six) hours as needed for nausea or vomiting. 30 tablet 2   No current facility-administered medications for this visit.    Facility-Administered Medications Ordered in Other Visits  Medication Dose Route Frequency Provider Last Rate Last Admin   0.9 %  sodium chloride infusion   Intravenous Continuous Malachy Mood, MD 10 mL/hr at 03/05/23 0921 New Bag at 03/05/23 0921   heparin lock flush 100 unit/mL  500 Units Intracatheter Once PRN Malachy Mood, MD       nivolumab (OPDIVO) 240 mg in sodium chloride 0.9 % 100 mL chemo infusion  240 mg Intravenous Once Malachy Mood, MD 248 mL/hr at 03/05/23 1010 240 mg at 03/05/23 1010   PACLitaxel (TAXOL) 234 mg in sodium chloride 0.9 % 250 mL chemo infusion (</= 80mg /m2)  120 mg/m2 (Treatment Plan Recorded) Intravenous Once Malachy Mood, MD       ramucirumab [facility]) 700 mg in sodium chloride 0.9 % 180 mL chemo infusion  8 mg/kg (Treatment Plan Recorded) Intravenous Once Malachy Mood, MD       sodium chloride flush (NS) 0.9 % injection 10 mL  10 mL Intracatheter PRN Malachy Mood, MD        PHYSICAL EXAMINATION: ECOG PERFORMANCE STATUS: 1 - Symptomatic but completely ambulatory  Vitals:   03/05/23 0850  BP: 127/80  Pulse: 80  Resp: 17  Temp: 98 F (36.7 C)  SpO2: 100%   Wt Readings from Last 3 Encounters:  03/05/23 181 lb 12.8 oz (82.5 kg)  02/19/23 186 lb 6.4 oz (84.6 kg)  02/05/23 185 lb 1.6 oz (84  kg)     GENERAL:alert, no distress and comfortable SKIN: skin color, texture, turgor are normal, no rashes or significant lesions EYES: normal, Conjunctiva are pink and non-injected, sclera clear NECK: supple, thyroid normal size, non-tender, without nodularity LYMPH:  no palpable lymphadenopathy in the cervical, axillary  LUNGS: clear to auscultation and percussion with normal breathing effort HEART: regular rate & rhythm and no murmurs and no lower extremity edema ABDOMEN:abdomen soft, non-tender and normal bowel sounds Musculoskeletal:no cyanosis of digits and no clubbing  NEURO: alert & oriented x 3 with fluent speech, no focal motor/sensory deficits   LABORATORY DATA:  I  have reviewed the data as listed    Latest Ref Rng & Units 03/05/2023    8:35 AM 02/19/2023    8:04 AM 02/05/2023    7:34 AM  CBC  WBC 4.0 - 10.5 K/uL 4.0  4.4  6.2   Hemoglobin 13.0 - 17.0 g/dL 57.8  46.9  62.9   Hematocrit 39.0 - 52.0 % 31.6  31.4  34.5   Platelets 150 - 400 K/uL 244  247  226         Latest Ref Rng & Units 03/05/2023    8:35 AM 02/19/2023    8:04 AM 02/05/2023    7:34 AM  CMP  Glucose 70 - 99 mg/dL 528  90  413   BUN 6 - 20 mg/dL 6  9  9    Creatinine 0.61 - 1.24 mg/dL 2.44  0.10  2.72   Sodium 135 - 145 mmol/L 139  144  140   Potassium 3.5 - 5.1 mmol/L 3.8  4.0  3.8   Chloride 98 - 111 mmol/L 108  113  108   CO2 22 - 32 mmol/L 27  27  28    Calcium 8.9 - 10.3 mg/dL 8.9  8.6  9.2   Total Protein 6.5 - 8.1 g/dL 6.9  6.2  6.7   Total Bilirubin 0.0 - 1.2 mg/dL 0.4  0.3  0.4   Alkaline Phos 38 - 126 U/L 105  96  101   AST 15 - 41 U/L 16  15  17    ALT 0 - 44 U/L 9  9  12        RADIOGRAPHIC STUDIES: I have personally reviewed the radiological images as listed and agreed with the findings in the report. No results found.    No orders of the defined types were placed in this encounter.  All questions were answered. The patient knows to call the clinic with any problems, questions or concerns. No barriers to learning was detected. The total time spent in the appointment was 25 minutes.     Malachy Mood, MD 03/05/2023

## 2023-03-05 NOTE — Patient Instructions (Signed)
 [facility] - A DEPT OF [facility]  Discharge Instructions: Thank you for choosing [facility] to provide your oncology and hematology care.   If you have a lab appointment with the Cancer Center, please go directly to the Cancer Center and check in at the registration area.   Wear comfortable clothing and clothing appropriate for easy access to any Portacath or PICC line.   We strive to give you quality time with your provider. You may need to reschedule your appointment if you arrive late (15 or more minutes).  Arriving late affects you and other patients whose appointments are after yours.  Also, if you miss three or more appointments without notifying the office, you may be dismissed from the clinic at the provider's discretion.      For prescription refill requests, have your pharmacy contact our office and allow 72 hours for refills to be completed.    Today you received the following chemotherapy and/or immunotherapy agents: Opdivo, Cyramza, and Paclitaxel      To help prevent nausea and vomiting after your treatment, we encourage you to take your nausea medication as directed.  BELOW ARE SYMPTOMS THAT SHOULD BE REPORTED IMMEDIATELY: *FEVER GREATER THAN 100.4 F (38 C) OR HIGHER *CHILLS OR SWEATING *NAUSEA AND VOMITING THAT IS NOT CONTROLLED WITH YOUR NAUSEA MEDICATION *UNUSUAL SHORTNESS OF BREATH *UNUSUAL BRUISING OR BLEEDING *URINARY PROBLEMS (pain or burning when urinating, or frequent urination) *BOWEL PROBLEMS (unusual diarrhea, constipation, pain near the anus) TENDERNESS IN MOUTH AND THROAT WITH OR WITHOUT PRESENCE OF ULCERS (sore throat, sores in mouth, or a toothache) UNUSUAL RASH, SWELLING OR PAIN  UNUSUAL VAGINAL DISCHARGE OR ITCHING   Items with * indicate a potential emergency and should be followed up as soon as possible or go to the Emergency Department if any problems should occur.  Please show the CHEMOTHERAPY ALERT  CARD or IMMUNOTHERAPY ALERT CARD at check-in to the Emergency Department and triage nurse.  Should you have questions after your visit or need to cancel or reschedule your appointment, please contact [facility] - A DEPT OF [facility]  Dept: [phone]  and follow the prompts.  Office hours are 8:00 a.m. to 4:30 p.m. Monday - Friday. Please note that voicemails left after 4:00 p.m. may not be returned until the following business day.  We are closed weekends and major holidays. You have access to a nurse at all times for urgent questions. Please call the main number to the clinic Dept: [phone] and follow the prompts.   For any non-urgent questions, you may also contact your provider using MyChart. We now offer e-Visits for anyone 32 and older to request care online for non-urgent symptoms. For details visit [email or web address].   Also download the MyChart app! Go to the app store, search "MyChart", open the app, select [facility], and log in with your MyChart username and password.

## 2023-03-06 ENCOUNTER — Encounter: Payer: Self-pay | Admitting: Nurse Practitioner

## 2023-03-19 ENCOUNTER — Inpatient Hospital Stay: Payer: BC Managed Care – PPO | Attending: Physician Assistant

## 2023-03-19 ENCOUNTER — Inpatient Hospital Stay (HOSPITAL_BASED_OUTPATIENT_CLINIC_OR_DEPARTMENT_OTHER): Payer: BC Managed Care – PPO | Admitting: Hematology

## 2023-03-19 ENCOUNTER — Inpatient Hospital Stay: Payer: BC Managed Care – PPO

## 2023-03-19 VITALS — BP 140/80 | HR 76 | Temp 98.3°F | Resp 20 | Ht 65.0 in | Wt 177.4 lb

## 2023-03-19 DIAGNOSIS — Z7952 Long term (current) use of systemic steroids: Secondary | ICD-10-CM | POA: Diagnosis not present

## 2023-03-19 DIAGNOSIS — Z79899 Other long term (current) drug therapy: Secondary | ICD-10-CM | POA: Diagnosis not present

## 2023-03-19 DIAGNOSIS — T451X5D Adverse effect of antineoplastic and immunosuppressive drugs, subsequent encounter: Secondary | ICD-10-CM | POA: Diagnosis not present

## 2023-03-19 DIAGNOSIS — Z5112 Encounter for antineoplastic immunotherapy: Secondary | ICD-10-CM | POA: Diagnosis present

## 2023-03-19 DIAGNOSIS — Z5111 Encounter for antineoplastic chemotherapy: Secondary | ICD-10-CM | POA: Diagnosis present

## 2023-03-19 DIAGNOSIS — R634 Abnormal weight loss: Secondary | ICD-10-CM | POA: Diagnosis not present

## 2023-03-19 DIAGNOSIS — C163 Malignant neoplasm of pyloric antrum: Secondary | ICD-10-CM

## 2023-03-19 DIAGNOSIS — G620 Drug-induced polyneuropathy: Secondary | ICD-10-CM | POA: Diagnosis not present

## 2023-03-19 DIAGNOSIS — Z79620 Long term (current) use of immunosuppressive biologic: Secondary | ICD-10-CM | POA: Insufficient documentation

## 2023-03-19 DIAGNOSIS — Z95828 Presence of other vascular implants and grafts: Secondary | ICD-10-CM

## 2023-03-19 DIAGNOSIS — E669 Obesity, unspecified: Secondary | ICD-10-CM | POA: Diagnosis not present

## 2023-03-19 DIAGNOSIS — E278 Other specified disorders of adrenal gland: Secondary | ICD-10-CM | POA: Insufficient documentation

## 2023-03-19 LAB — CBC WITH DIFFERENTIAL (CANCER CENTER ONLY)
Abs Immature Granulocytes: 0.01 10*3/uL (ref 0.00–0.07)
Basophils Absolute: 0.1 10*3/uL (ref 0.0–0.1)
Basophils Relative: 1 %
Eosinophils Absolute: 0.1 10*3/uL (ref 0.0–0.5)
Eosinophils Relative: 3 %
HCT: 30.5 % — ABNORMAL LOW (ref 39.0–52.0)
Hemoglobin: 9.8 g/dL — ABNORMAL LOW (ref 13.0–17.0)
Immature Granulocytes: 0 %
Lymphocytes Relative: 32 %
Lymphs Abs: 1.4 10*3/uL (ref 0.7–4.0)
MCH: 27 pg (ref 26.0–34.0)
MCHC: 32.1 g/dL (ref 30.0–36.0)
MCV: 84 fL (ref 80.0–100.0)
Monocytes Absolute: 0.8 10*3/uL (ref 0.1–1.0)
Monocytes Relative: 19 %
Neutro Abs: 1.9 10*3/uL (ref 1.7–7.7)
Neutrophils Relative %: 45 %
Platelet Count: 257 10*3/uL (ref 150–400)
RBC: 3.63 MIL/uL — ABNORMAL LOW (ref 4.22–5.81)
RDW: 19.2 % — ABNORMAL HIGH (ref 11.5–15.5)
WBC Count: 4.4 10*3/uL (ref 4.0–10.5)
nRBC: 0 % (ref 0.0–0.2)

## 2023-03-19 LAB — CMP (CANCER CENTER ONLY)
ALT: 11 U/L (ref 0–44)
AST: 22 U/L (ref 15–41)
Albumin: 3.9 g/dL (ref 3.5–5.0)
Alkaline Phosphatase: 95 U/L (ref 38–126)
Anion gap: 4 — ABNORMAL LOW (ref 5–15)
BUN: 7 mg/dL (ref 6–20)
CO2: 27 mmol/L (ref 22–32)
Calcium: 8.6 mg/dL — ABNORMAL LOW (ref 8.9–10.3)
Chloride: 109 mmol/L (ref 98–111)
Creatinine: 0.86 mg/dL (ref 0.61–1.24)
GFR, Estimated: >60 mL/min (ref >60)
Glucose, Bld: 70 mg/dL (ref 70–99)
Potassium: 4.3 mmol/L (ref 3.5–5.1)
Sodium: 140 mmol/L (ref 135–145)
Total Bilirubin: 0.5 mg/dL (ref 0.0–1.2)
Total Protein: 7.2 g/dL (ref 6.5–8.1)

## 2023-03-19 LAB — CEA (ACCESS): CEA (CHCC): 202.35 ng/mL — ABNORMAL HIGH (ref 0.00–5.00)

## 2023-03-19 MED ORDER — SODIUM CHLORIDE 0.9 % IV SOLN
8.0000 mg/kg | Freq: Once | INTRAVENOUS | Status: AC
  Administered 2023-03-19: 700 mg via INTRAVENOUS
  Filled 2023-03-19: qty 20

## 2023-03-19 MED ORDER — SODIUM CHLORIDE 0.9% FLUSH
10.0000 mL | Freq: Once | INTRAVENOUS | Status: AC
  Administered 2023-03-19: 10 mL

## 2023-03-19 MED ORDER — SODIUM CHLORIDE 0.9 % IV SOLN
240.0000 mg | Freq: Once | INTRAVENOUS | Status: AC
  Administered 2023-03-19: 240 mg via INTRAVENOUS
  Filled 2023-03-19: qty 24

## 2023-03-19 MED ORDER — SODIUM CHLORIDE 0.9 % IV SOLN: INTRAVENOUS | Status: DC

## 2023-03-19 MED ORDER — SODIUM CHLORIDE 0.9% FLUSH
10.0000 mL | INTRAVENOUS | Status: DC | PRN
  Administered 2023-03-19 (×2): 10 mL

## 2023-03-19 MED ORDER — DIPHENHYDRAMINE HCL 50 MG/ML IJ SOLN
25.0000 mg | Freq: Once | INTRAMUSCULAR | Status: AC
  Administered 2023-03-19: 25 mg via INTRAVENOUS
  Filled 2023-03-19: qty 1

## 2023-03-19 MED ORDER — SODIUM CHLORIDE 0.9 % IV SOLN
100.0000 mg/m2 | Freq: Once | INTRAVENOUS | Status: AC
  Administered 2023-03-19: 198 mg via INTRAVENOUS
  Filled 2023-03-19: qty 33

## 2023-03-19 MED ORDER — ACETAMINOPHEN 325 MG PO TABS
650.0000 mg | ORAL_TABLET | Freq: Once | ORAL | Status: AC
  Administered 2023-03-19: 650 mg via ORAL
  Filled 2023-03-19: qty 2

## 2023-03-19 MED ORDER — HEPARIN SOD (PORK) LOCK FLUSH 100 UNIT/ML IV SOLN
500.0000 [IU] | Freq: Once | INTRAVENOUS | Status: AC | PRN
Start: 2023-03-19 — End: 2023-03-19
  Administered 2023-03-19: 500 [IU]

## 2023-03-19 MED ORDER — FAMOTIDINE IN NACL 20-0.9 MG/50ML-% IV SOLN
20.0000 mg | Freq: Once | INTRAVENOUS | Status: AC
  Administered 2023-03-19: 20 mg via INTRAVENOUS
  Filled 2023-03-19: qty 50

## 2023-03-19 NOTE — Progress Notes (Signed)
 [facility]   Telephone:([phone] [phone]   Clinic Follow up Note   Patient Care Team: Pcp, No as PCP - General Malachy Mood, MD as Consulting Physician (Hematology and Oncology)  Date of Service:  03/19/2023  CHIEF COMPLAINT: f/u of gastric cancer  CURRENT THERAPY:  Second line paclitaxel and ramucirumab every 2 weeks  Oncology History   Gastric cancer (HCC) cT3N3Mx, with hypermetabolic left supraclavicular node, and indeterminate adrenal nodule, MMR normal ypT3ypN3a, liver and nodes recurrence in 07/2022 -diagnosed 10/18/21 by EGD for 6 month f/u of gastric ulcers, showed mucosal intramucosal adenocarcinoma. MMR normal. -baseline CEA 11/01/21 significantly elevated at 1,989.58. -EUS on 11/06/21 by Dr. Dulce Sellar, staged as T3 N3 (at least stage III) -staging CT CAP 11/08/21 showed: stable gastric antrum wall thickening; increased size of upper abdominal lymph nodes; stable left adrenal nodule, 1.9 cm. -PET scan showed FDG avid lymph nodes within the gastrohepatic ligament, portacaval region, and mesentery. Tracer avid left supraclavicular lymph node and indeterminate left adrenal gland nodule with mild FDG uptake. -he underwent [place] node biopsy on 1/17 which was negative for malignant cells, but no lymphoid tissue seen on biopsy  -repeated PET on 02/07/22 after 3 months neoadjuvant chemo showed resolved left supraclavicular lymph node, and significant improvement in regional lymph nodes and the primary tumor, stable and indeterminate left adrenal nodule.  -CT adrenal protocol showed the left adrenal nodule is likely benign adenoma, no biopsy is needed. -Completed neoadjvant chemo FLOT s/p 12 cycles 11/22/21 - 04/27/2022 -restaging PET on 05/16/22 showed significantly hypermetabolic activity at the primary gastric cancer in pylorus, and a small hypermetabolic mesenteric lymph node, no other evidence of metastasis. surgeon Dr. Sheliah Hatch aware of PET findings -S/p distal  gastrectomy 05/20/22, path showed ypT3N3a with clear margins, 7/18 + LNs, and lymphovascular invasion identified. There was some treatment effect in a few lymph nodes nut not in the primary tumor -due to rising tumor marker, he underwent PET scan on July 07, 2022, which unfortunately showed hypermetabolic new adenopathy in right hilar and mesentery, diffuse liver metastasis, consistent with metastatic recurrence.  I personally reviewed the PET scan images with patient -Unfortunately his disease is not curable at this stage. -The goal of chemotherapy is palliative, to prolong his life -I discussed his next generation sequencing foundation one result, which showed EGFR amplification, no other targetable mutations.  His tumor was previously tested for HER2 which was negative (0-1+).  PD-L1 CPS score was 2%, no significant benefit of immunotherapy.  -He started paclitaxel and ramucirumab on 07/25/2022  He developed worsening neuropathy quickly, and I had to switch his treatment to FOLFIRI on 08/29/2022, every 2 weeks, he is overall tolerating well. -restaging CT 01/19/2023 showed disease progression with new pathologic right paratracheal mediastinal lymph nodes as well as increasing size of several liver metastases. -I changed his chemo to taxol and ramucizumab, every 2 weeks per his request      Assessment and Plan    Metastatic gastric cancer Undergoing treatment with stable tumor markers. Last CT scan in January. Experiencing neuropathy likely due to paclitaxel, which has been slightly reduced. On immunotherapy nivolumab and ramucirumab.  Reports weight loss, encouraged to snack to maintain nutrition. CT scan planned for early April 2025 to monitor disease progression. - Order CT scan for early April 2025 - Continue chemotherapy regimen with reduced paclitaxel - Continue immunotherapy with romosozumab - Encourage nutritional intake and snacking  Chemotherapy-induced peripheral neuropathy Experiences  neuropathy primarily in the feet, with some involvement  in the hands, likely due to paclitaxel. Manages pain with gabapentin and occasional acetaminophen. Reports significant pain when medication is missed. Gabapentin dose adjusted to 300 mg, three times daily, which is effective. Ice used during chemotherapy to reduce neuropathy, advised to continue. - Continue gabapentin 300 mg, three times daily - Use ice during chemotherapy to reduce neuropathy - Encourage use of warm socks or warm therapy at home for neuropathy relief - Ensure availability of medication at work to prevent missed doses      Plan -Lab reviewed, adequate for treatment, will proceed chemo today, and slightly reduce paclitaxel dose due to his neuropathy -Follow-up in 2 weeks before next cycle chemo.   SUMMARY OF ONCOLOGIC HISTORY: Oncology History Overview Note   Cancer Staging  Gastric cancer [facility]) Staging form: Stomach, AJCC 8th Edition - Clinical stage from 10/18/2021: Stage III (cT3, cN3a, cM0) - Signed by Malachy Mood, MD on 11/10/2021     Gastric cancer (HCC)  10/18/2021 Procedure   EGD performed under the care of Dr. Bosie Clos  Findings:  -A large, ulcerated, partially circumferential (involving two thirds of the lumen circumference) mass with no bleeding and stigmata of recent bleeding was found in the gastric antrum, at the pylorus and in the prepyloric region of the stomach. -Segmental severe inflammation characterized by congestion (edema) and erythema was found in the gastric antrum.   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 Negative for dysplasia.  Negative for helicobacter pylori  Mismatch Repair (MMR) Protein Imunohistochemistry (IHC): IHC Expression Result: MLH1: Preserved nuclear expression. MSH2: Preserved nuclear expression. MSH6: Preserved nuclear expression. PMS2: Preserved nuclear expression. Interpretation: NORMAL   10/31/2021 Initial Diagnosis   Gastric cancer (HCC)   11/01/2021 Tumor Marker   CEA = 8,295.62 (^)   11/06/2021 Procedure   Upper EUS, Dr. Dulce Sellar  Impression: - Normal esophagus. - A small amount of food (residue) in the stomach. - Congested, friable (with contact bleeding) and ulcerated mucosa in the prepyloric region of the stomach. - Normal examined duodenum. - There was no evidence of significant pathology in the left lobe of the liver. - Many abnormal lymph nodes (over 10) were visualized in the gastrohepatic ligament (level 18), celiac region (level 20), perigastric region, peripancreatic region and aortocaval region. - Wall thickening was seen in the prepyloric region of the stomach. The thickening appeared to be primarily within the deep mucosa (Layer 2) but extended through the muscularis propria. - Overall constellation of findings consistent with at least T3 N3 Mx (at least stage III) gastric adenocarcinoma.   11/08/2021 Imaging   EXAM: CT CHEST, ABDOMEN, AND PELVIS WITH CONTRAST  IMPRESSION: 1. Similar irregular wall thickening of the gastric antrum, consistent with patient's known primary gastric neoplasm. 2. Increased size of the upper abdominal lymph nodes, concerning for worsening nodal disease involvement. 3. Stable left adrenal nodule common nonspecific possibly a metastatic lesion or adenoma. 4. No evidence of metastatic disease in the chest. 5. Left-sided colonic diverticulosis without findings of acute diverticulitis. 6. Enlarged prostate gland. 7.  Aortic Atherosclerosis (ICD10-I70.0).   11/22/2021 - 04/26/2022  Chemotherapy   Patient is on Treatment Plan : GASTROESOPHAGEAL  FLOT q14d X 4 cycles     11/27/2021 Imaging    IMPRESSION: 1. The mass within the gastric antrum is mildly decreased in size when compared with 11/08/2021. There is persistent FDG uptake associated with this mass compatible with residual tumor. 2. Persistent FDG avid lymph nodes within the gastrohepatic ligament, portacaval region, and mesentery. These are mildly decreased in size when compared with 11/08/2021. 3. Tracer avid left supraclavicular lymph node is also mildly decreased in size when compared with 11/08/2021. 4. Indeterminate left adrenal gland nodule with mild FDG uptake. This may represent a lipid poor adenoma. Metastatic disease not exclude. 5. Diffuse increased uptake throughout the bone marrow is favored to represent treatment related change. 6.  Aortic Atherosclerosis (ICD10-I70.0).     02/07/2022 Imaging    IMPRESSION: 1. No substantial change in hypermetabolism associated with the distal gastric lesion. Although difficult to discern on noncontrast CT imaging, the soft tissue component appears to be decreased since the prior PET-CT. 2. Interval resolution of the hypermetabolic left supraclavicular lymph node. 3. Interval marked decrease of the hypermetabolic gastrohepatic ligament lymphadenopathy. Similar decrease in size and hypermetabolism associated with index nodes identified previously in the 4. porta hepatis and hypogastric region. 5. Stable left adrenal nodule with slight hypermetabolism. Nonspecific finding could represent lipid poor adenoma or metastatic deposit. 6. No new suspicious hypermetabolic disease on today's study. 7.  Aortic Atherosclerosis (ICD10-I70.0).   07/25/2022 - 08/15/2022 Chemotherapy   Patient is on Treatment Plan : GASTROESOPHAGEAL Ramucirumab D1, 15 + Paclitaxel D1,8,15 q28d     08/29/2022 - 01/24/2023 Chemotherapy   Patient is on Treatment Plan : COLORECTAL FOLFIRI  q14d     02/05/2023 -  Chemotherapy   Patient is on Treatment Plan : GASTROESOPHAGEAL Ramucirumab D1, 15 + Paclitaxel D1,8,15 q28d        Discussed the use of AI scribe software for clinical note transcription with the patient, who gave verbal consent to proceed.  History of Present Illness   Mr. Camey, a patient with metastatic gastric cancer, presents for a follow-up visit. The patient reports persistent neuropathic pain in the feet and hands, which is managed with gabapentin and Tylenol. The pain is more severe in the feet, occasionally impairing the patient's ability to stand or walk. The patient has found a regimen that works for him, taking two Tylenol in the morning, gabapentin at lunchtime, and gabapentin at night. The patient has been taking three 300mg  gabapentin pills, which seems to control the pain effectively.  The patient also reports weight loss, which he attributes to a decrease in snacking. He currently eats three meals a day and has been advised to reintroduce snacking to help maintain his weight. Despite the pain and weight loss, the patient reports that his energy levels are fine and he is still able to work. However, he acknowledges that forgetting to take his medication can significantly impact his ability to perform his physically demanding job.         All other systems were reviewed with the patient and are negative.  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    SURGICAL HISTORY: Past Surgical History:  Procedure Laterality Date   BIOPSY  04/12/2021   Procedure: BIOPSY;  Surgeon: Charlott Rakes, MD;  Location: [facility];  Service: Gastroenterology;;   ESOPHAGOGASTRODUODENOSCOPY N/A  04/12/2021   Procedure: ESOPHAGOGASTRODUODENOSCOPY (EGD);  Surgeon: Charlott Rakes, MD;  Location: [facility];  Service: Gastroenterology;  Laterality: N/A;   ESOPHAGOGASTRODUODENOSCOPY N/A 11/06/2021   Procedure: ESOPHAGOGASTRODUODENOSCOPY (EGD);  Surgeon: Willis Modena, MD;  Location: [facility];  Service: Gastroenterology;  Laterality: N/A;   GASTRECTOMY N/A 05/20/2022   Procedure: OPEN PARTIAL GASTRECTOMY WITH GASTROJEJUNAL ANASTOMOSIS;  Surgeon: Sheliah Hatch, De Blanch, MD;  Location: [facility];  Service: General;  Laterality: N/A;   HERNIA REPAIR Left    inguinal   IR IMAGING GUIDED PORT INSERTION  11/15/2021   LAPAROSCOPY N/A 05/20/2022   Procedure: LAPAROSCOPY DIAGNOSTIC;  Surgeon: Rodman Pickle, MD;  Location: [facility];  Service: General;  Laterality: N/A;   UPPER ESOPHAGEAL ENDOSCOPIC ULTRASOUND (EUS) Bilateral 11/06/2021   Procedure: UPPER ESOPHAGEAL ENDOSCOPIC ULTRASOUND (EUS);  Surgeon: Willis Modena, MD;  Location: [facility];  Service: Gastroenterology;  Laterality: Bilateral;    I have reviewed the social history and family history with the patient and they are unchanged from previous note.  ALLERGIES:  has no known allergies.  MEDICATIONS:  Current Outpatient Medications  Medication Sig Dispense Refill   acetaminophen (TYLENOL) 500 MG tablet Take 1 tablet (500 mg total) by mouth 2 (two) times daily. 60 tablet 3   gabapentin (NEURONTIN) 300 MG capsule Take 1 capsule (300 mg total) by mouth 3 (three) times daily. 90 capsule 2   ondansetron (ZOFRAN-ODT) 4 MG disintegrating tablet Dissolve 1 tablet (4 mg total) by mouth every 6 (six) hours as needed for nausea or vomiting. 30 tablet 1   pantoprazole (PROTONIX) 40 MG tablet Take 1 tablet (40 mg) by mouth 2 times daily. 60 tablet 6   prochlorperazine (COMPAZINE) 10 MG tablet Take 1 tablet (10 mg total) by mouth every 6 (six) hours as needed for nausea or vomiting. 30 tablet 2   No current facility-administered  medications for this visit.   Facility-Administered Medications Ordered in Other Visits  Medication Dose Route Frequency Provider Last Rate Last Admin   0.9 %  sodium chloride infusion   Intravenous Continuous Malachy Mood, MD   Stopped at 03/19/23 1319   PACLitaxel (TAXOL) 198 mg in sodium chloride 0.9 % 250 mL chemo infusion (</= 80mg /m2)  100 mg/m2 (Treatment Plan Recorded) Intravenous Once Malachy Mood, MD 94 mL/hr at 03/19/23 1332 198 mg at 03/19/23 1332   sodium chloride flush (NS) 0.9 % injection 10 mL  10 mL Intracatheter PRN Malachy Mood, MD   10 mL at 03/19/23 1056    PHYSICAL EXAMINATION: ECOG PERFORMANCE STATUS: 1 - Symptomatic but completely ambulatory  Vitals:   03/19/23 0942  BP: (!) 140/80  Pulse: 76  Resp: 20  Temp: 98.3 F (36.8 C)  SpO2: 98%   Wt Readings from Last 3 Encounters:  03/19/23 177 lb 6.4 oz (80.5 kg)  03/05/23 181 lb 12.8 oz (82.5 kg)  02/19/23 186 lb 6.4 oz (84.6 kg)     GENERAL:alert, no distress and comfortable SKIN: skin color, texture, turgor are normal, no rashes or significant lesions EYES: normal, Conjunctiva are pink and non-injected, sclera clear NECK: supple, thyroid normal size, non-tender, without nodularity LYMPH:  no palpable lymphadenopathy in the cervical, axillary  LUNGS: clear to auscultation and percussion with normal breathing effort HEART: regular rate & rhythm and no murmurs and no lower extremity edema ABDOMEN:abdomen soft, non-tender and normal bowel sounds Musculoskeletal:no cyanosis of  digits and no clubbing  NEURO: alert & oriented x 3 with fluent speech, no focal motor/sensory deficits   LABORATORY DATA:  I have reviewed the data as listed    Latest Ref Rng & Units 03/19/2023    9:07 AM 03/05/2023    8:35 AM 02/19/2023    8:04 AM  CBC  WBC 4.0 - 10.5 K/uL 4.4  4.0  4.4   Hemoglobin 13.0 - 17.0 g/dL 9.8  19.1  47.8   Hematocrit 39.0 - 52.0 % 30.5  31.6  31.4   Platelets 150 - 400 K/uL 257  244  247         Latest  Ref Rng & Units 03/19/2023    9:07 AM 03/05/2023    8:35 AM 02/19/2023    8:04 AM  CMP  Glucose 70 - 99 mg/dL 70  295  90   BUN 6 - 20 mg/dL 7  6  9    Creatinine 0.61 - 1.24 mg/dL 6.21  3.08  6.57   Sodium 135 - 145 mmol/L 140  139  144   Potassium 3.5 - 5.1 mmol/L 4.3  3.8  4.0   Chloride 98 - 111 mmol/L 109  108  113   CO2 22 - 32 mmol/L 27  27  27    Calcium 8.9 - 10.3 mg/dL 8.6  8.9  8.6   Total Protein 6.5 - 8.1 g/dL 7.2  6.9  6.2   Total Bilirubin 0.0 - 1.2 mg/dL 0.5  0.4  0.3   Alkaline Phos 38 - 126 U/L 95  105  96   AST 15 - 41 U/L 22  16  15    ALT 0 - 44 U/L 11  9  9        RADIOGRAPHIC STUDIES: I have personally reviewed the radiological images as listed and agreed with the findings in the report. No results found.    Orders Placed This Encounter  Procedures   CT CHEST ABDOMEN PELVIS W CONTRAST    Standing Status:   Future    Expected Date:   04/09/2023    Expiration Date:   03/18/2024    If indicated for the ordered procedure, I authorize the administration of contrast media per Radiology protocol:   Yes    Does the patient have a contrast media/X-ray dye allergy?:   No    Preferred imaging location?:   [facility]    If indicated for the ordered procedure, I authorize the administration of oral contrast media per Radiology protocol:   Yes   CBC with Differential (Cancer Center Only)    Standing Status:   Future    Expected Date:   05/28/2023    Expiration Date:   05/27/2024   CMP (Cancer Center only)    Standing Status:   Future    Expected Date:   05/28/2023    Expiration Date:   05/27/2024   CBC with Differential (Cancer Center Only)    Standing Status:   Future    Expected Date:   06/11/2023    Expiration Date:   06/10/2024   CMP (Cancer Center only)    Standing Status:   Future    Expected Date:   06/11/2023    Expiration Date:   06/10/2024   All questions were answered. The patient knows to call the clinic with any problems, questions or concerns. No  barriers to learning was detected. The total time spent in the appointment was 30 minutes.  Malachy Mood, MD 03/19/2023

## 2023-03-19 NOTE — Patient Instructions (Signed)
 [facility] - A DEPT OF [facility]  Discharge Instructions: Thank you for choosing [facility] to provide your oncology and hematology care.   If you have a lab appointment with the Cancer Center, please go directly to the Cancer Center and check in at the registration area.   Wear comfortable clothing and clothing appropriate for easy access to any Portacath or PICC line.   We strive to give you quality time with your provider. You may need to reschedule your appointment if you arrive late (15 or more minutes).  Arriving late affects you and other patients whose appointments are after yours.  Also, if you miss three or more appointments without notifying the office, you may be dismissed from the clinic at the provider's discretion.      For prescription refill requests, have your pharmacy contact our office and allow 72 hours for refills to be completed.    Today you received the following chemotherapy and/or immunotherapy agents: ramucirumab, nivolumab, paclitaxel       To help prevent nausea and vomiting after your treatment, we encourage you to take your nausea medication as directed.  BELOW ARE SYMPTOMS THAT SHOULD BE REPORTED IMMEDIATELY: *FEVER GREATER THAN 100.4 F (38 C) OR HIGHER *CHILLS OR SWEATING *NAUSEA AND VOMITING THAT IS NOT CONTROLLED WITH YOUR NAUSEA MEDICATION *UNUSUAL SHORTNESS OF BREATH *UNUSUAL BRUISING OR BLEEDING *URINARY PROBLEMS (pain or burning when urinating, or frequent urination) *BOWEL PROBLEMS (unusual diarrhea, constipation, pain near the anus) TENDERNESS IN MOUTH AND THROAT WITH OR WITHOUT PRESENCE OF ULCERS (sore throat, sores in mouth, or a toothache) UNUSUAL RASH, SWELLING OR PAIN  UNUSUAL VAGINAL DISCHARGE OR ITCHING   Items with * indicate a potential emergency and should be followed up as soon as possible or go to the Emergency Department if any problems should occur.  Please show the CHEMOTHERAPY  ALERT CARD or IMMUNOTHERAPY ALERT CARD at check-in to the Emergency Department and triage nurse.  Should you have questions after your visit or need to cancel or reschedule your appointment, please contact [facility] - A DEPT OF [facility]  Dept: [phone]  and follow the prompts.  Office hours are 8:00 a.m. to 4:30 p.m. Monday - Friday. Please note that voicemails left after 4:00 p.m. may not be returned until the following business day.  We are closed weekends and major holidays. You have access to a nurse at all times for urgent questions. Please call the main number to the clinic Dept: [phone] and follow the prompts.   For any non-urgent questions, you may also contact your provider using MyChart. We now offer e-Visits for anyone 88 and older to request care online for non-urgent symptoms. For details visit [email or web address].   Also download the MyChart app! Go to the app store, search "MyChart", open the app, select [facility], and log in with your MyChart username and password.

## 2023-03-19 NOTE — Assessment & Plan Note (Signed)
 cT3N3Mx, with hypermetabolic left supraclavicular node, and indeterminate adrenal nodule, MMR normal ypT3ypN3a, liver and nodes recurrence in 07/2022 -diagnosed 10/18/21 by EGD for 6 month f/u of gastric ulcers, showed mucosal intramucosal adenocarcinoma. MMR normal. -baseline CEA 11/01/21 significantly elevated at 1,989.58. -EUS on 11/06/21 by Dr. Dulce Sellar, staged as T3 N3 (at least stage III) -staging CT CAP 11/08/21 showed: stable gastric antrum wall thickening; increased size of upper abdominal lymph nodes; stable left adrenal nodule, 1.9 cm. -PET scan showed FDG avid lymph nodes within the gastrohepatic ligament, portacaval region, and mesentery. Tracer avid left supraclavicular lymph node and indeterminate left adrenal gland nodule with mild FDG uptake. -he underwent [place] node biopsy on 1/17 which was negative for malignant cells, but no lymphoid tissue seen on biopsy  -repeated PET on 02/07/22 after 3 months neoadjuvant chemo showed resolved left supraclavicular lymph node, and significant improvement in regional lymph nodes and the primary tumor, stable and indeterminate left adrenal nodule.  -CT adrenal protocol showed the left adrenal nodule is likely benign adenoma, no biopsy is needed. -Completed neoadjvant chemo FLOT s/p 12 cycles 11/22/21 - 04/27/2022 -restaging PET on 05/16/22 showed significantly hypermetabolic activity at the primary gastric cancer in pylorus, and a small hypermetabolic mesenteric lymph node, no other evidence of metastasis. surgeon Dr. Sheliah Hatch aware of PET findings -S/p distal gastrectomy 05/20/22, path showed ypT3N3a with clear margins, 7/18 + LNs, and lymphovascular invasion identified. There was some treatment effect in a few lymph nodes nut not in the primary tumor -due to rising tumor marker, he underwent PET scan on July 07, 2022, which unfortunately showed hypermetabolic new adenopathy in right hilar and mesentery, diffuse liver metastasis, consistent with metastatic  recurrence.  I personally reviewed the PET scan images with patient -Unfortunately his disease is not curable at this stage. -The goal of chemotherapy is palliative, to prolong his life -I discussed his next generation sequencing foundation one result, which showed EGFR amplification, no other targetable mutations.  His tumor was previously tested for HER2 which was negative (0-1+).  PD-L1 CPS score was 2%, no significant benefit of immunotherapy.  -He started paclitaxel and ramucirumab on 07/25/2022  He developed worsening neuropathy quickly, and I had to switch his treatment to FOLFIRI on 08/29/2022, every 2 weeks, he is overall tolerating well. -restaging CT 01/19/2023 showed disease progression with new pathologic right paratracheal mediastinal lymph nodes as well as increasing size of several liver metastases. -I changed his chemo to taxol and ramucizumab, every 2 weeks per his request

## 2023-03-22 ENCOUNTER — Encounter: Payer: Self-pay | Admitting: Nurse Practitioner

## 2023-03-23 IMAGING — CT CT ABD-PELV W/ CM
2 of 5 series · 15 of 46 positions shown, 17 images · IV contrast (OMNIPAQUE 300)
Comparison: None.

CLINICAL DATA: Acute abdominal pain.

EXAM:
CT ABDOMEN AND PELVIS WITH CONTRAST
TECHNIQUE: Multidetector CT imaging of the abdomen and pelvis was performed
using the standard protocol following bolus administration of
intravenous contrast.

[Series 2: axial st · axial · 0.77mm/px · z∈[+1078,+1438]mm · 12 of 84 slices shown, 14 images]
[im 6/84  soft-tissue]
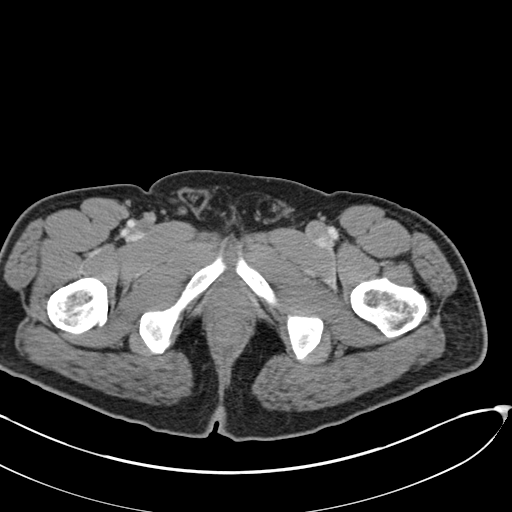
[im 6/84  bone]
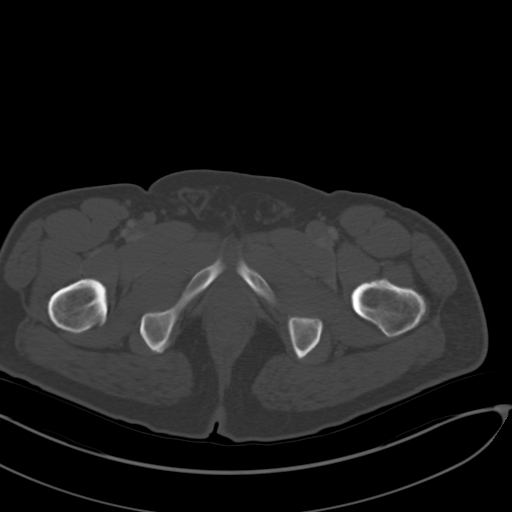
[im 12/84  soft-tissue]
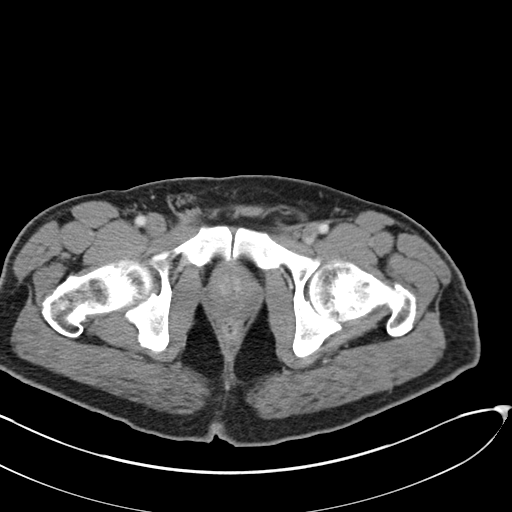
[im 17/84  soft-tissue]
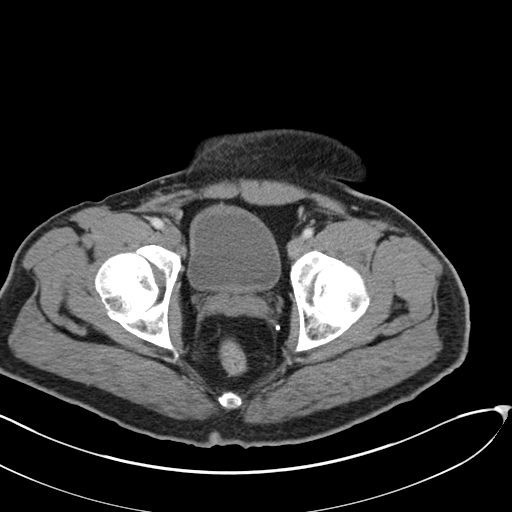
[im 28/84  soft-tissue]
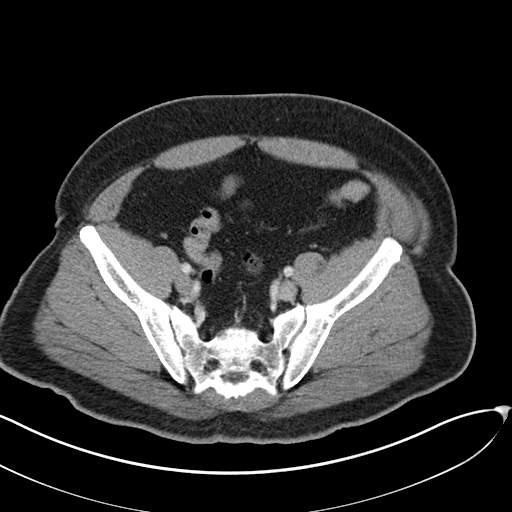
[im 34/84  soft-tissue]
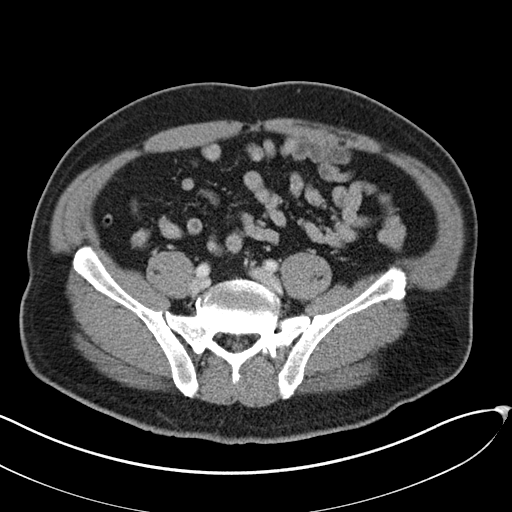
[im 39/84  soft-tissue]
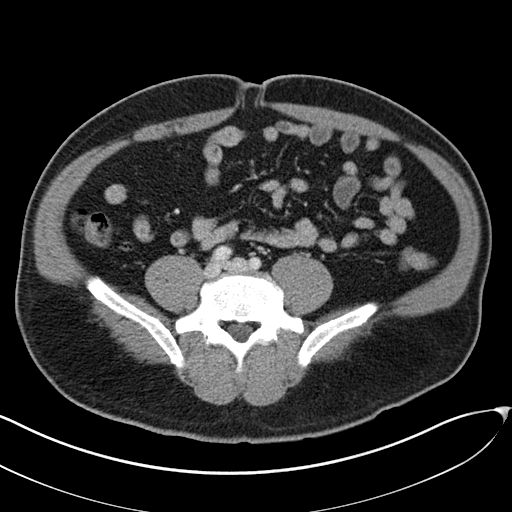
[im 45/84  soft-tissue]
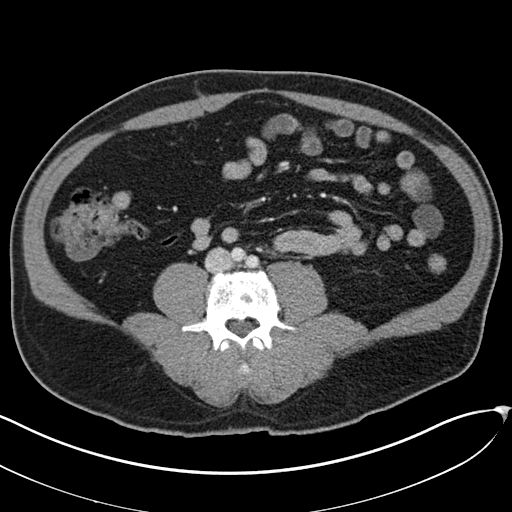
[im 50/84  soft-tissue]
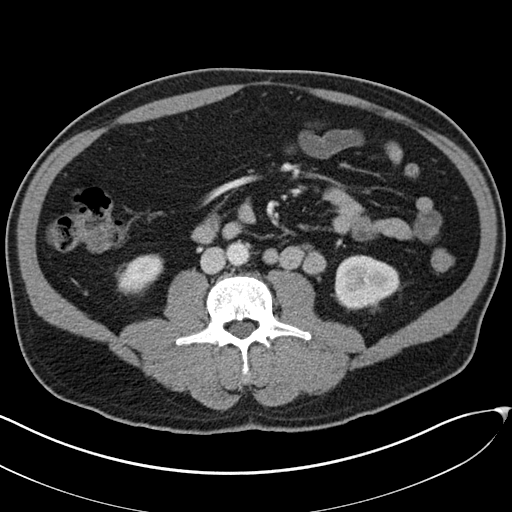
[im 56/84  soft-tissue]
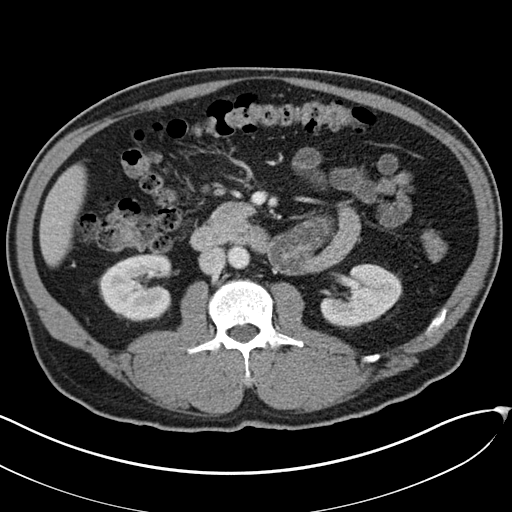
[im 56/84  bone]
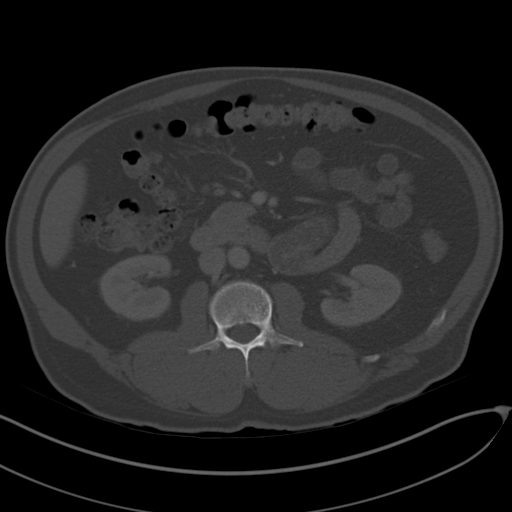
[im 67/84  soft-tissue]
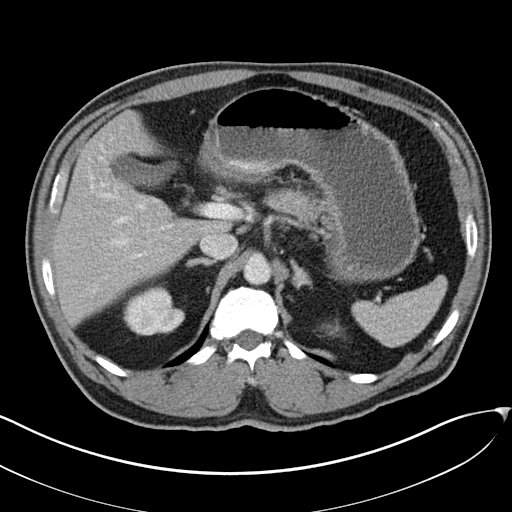
[im 72/84  soft-tissue]
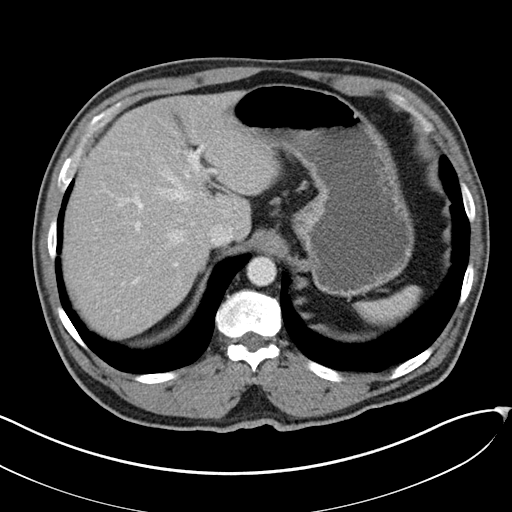
[im 78/84  soft-tissue]
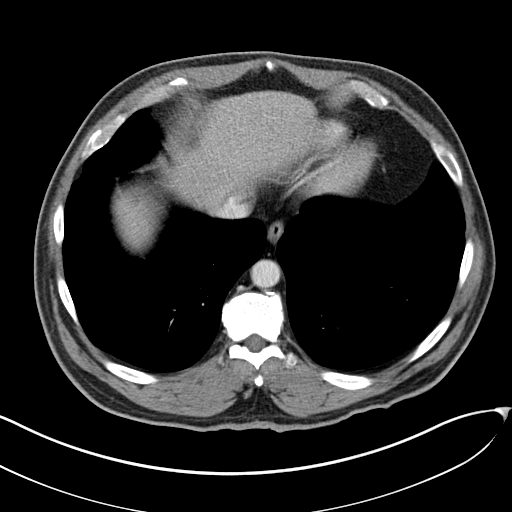

[Series 3: coronal st · coronal · 0.73mm/px · 3 of 153 slices shown]
[im 51/153  soft-tissue]
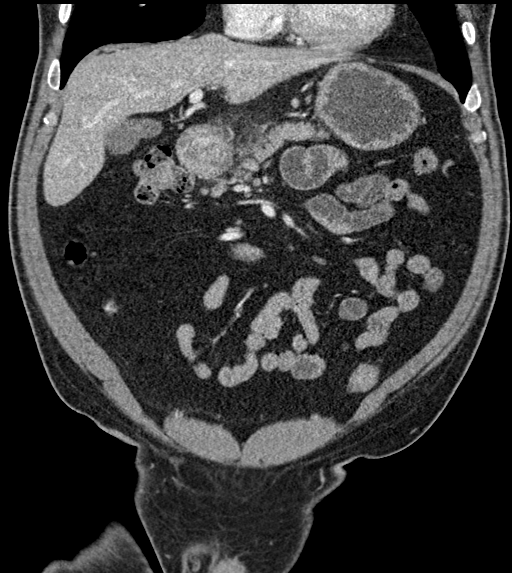
[im 68/153  soft-tissue]
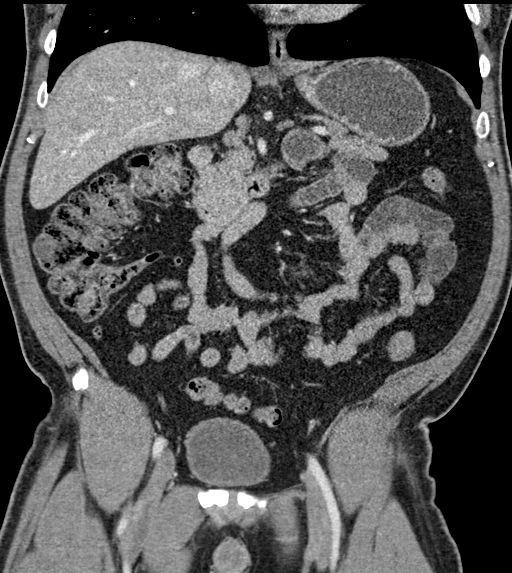
[im 85/153  soft-tissue]
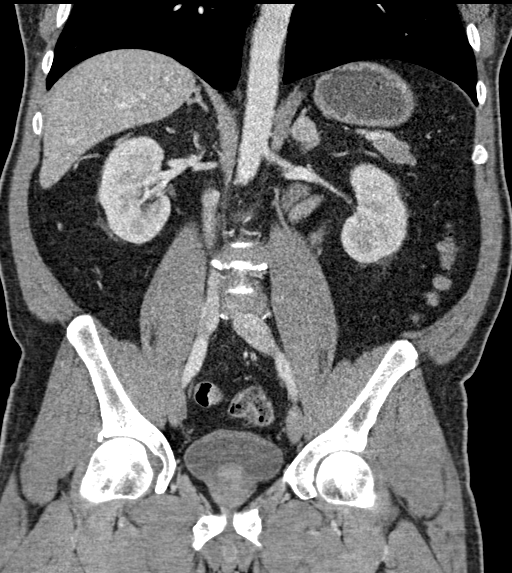

[15 of 46 positions shown; findings below may reference images not displayed]

RADIATION DOSE REDUCTION: This exam was performed according to the
departmental dose-optimization program which includes automated
exposure control, adjustment of the mA and/or kV according to
patient size and/or use of iterative reconstruction technique.

CONTRAST:  100mL OMNIPAQUE IOHEXOL 300 MG/ML  SOLN
FINDINGS: Lower chest: Lung bases are clear of infiltrates. There is a
calcified granuloma laterally in the left lower lobe. The cardiac
size is normal.

Hepatobiliary: The liver 15.5 cm length mildly steatotic without
mass enhancement. Unremarkable gallbladder, bile ducts.

Pancreas: No focal abnormality.

Spleen: No focal abnormality.

Adrenals/Urinary Tract: 2 cm indeterminate left adrenal mass.
Further evaluation recommended. There is no right adrenal mass. No
significant abnormality of the renal cortex. There is a small
wedge-shaped infarct defect in the superior pole right kidney.

There is no urinary stone or obstruction. There is mild bladder
thickening versus nondistention. There is a nodular protrusion of
the prostate gland against the bladder base.

Stomach/Bowel: The stomach is distended with fluid and there is
moderate irregular circumferential thickening of the distal gastric
antrum. The unopacified small bowel is unremarkable. The appendix is
normal and well seen. There is mild stool retention. There are
colonic diverticula without evidence of diverticulitis.

Vascular/Lymphatic: Minimal aortic atherosclerosis. No AAA. There
are enlarged mesenteric lymph nodes in the hypogastrium centered
below the thickened gastric antrum and measuring up to 1.4 cm in
short axis. Mildly prominent portacaval and periportal nodes are
noted up to 1 cm in short axis. There is no pelvic adenopathy.

Reproductive: Enlarged prostate measures 4.8 cm, as above there is a
nodular protrusion superiorly against the bladder base. Clinical
correlation recommended.

Other: Small umbilical and bilateral inguinal fat hernias. There are
pelvic phleboliths. There is no free air, hemorrhage or fluid.

Musculoskeletal: There are mild degenerative changes of the spine
and hips. No acute or significant osseous findings.
IMPRESSION: 1. There is circumferential irregular wall thickening of the gastric
antrum which significantly narrows the distal gastric lumen and
could be partially obstructing gastric emptying as the more proximal
stomach is distended with fluid. Severe peptic ulcer disease and
infiltrating disease are both possibilities and there is underlying
adenopathy in the hypogastrium. Follow-up endoscopy is recommended.
2. Mild hepatic steatosis.
3. Prostatomegaly with mild bladder thickening or nondistention.
4. Diverticulosis without diverticulitis.
5. Umbilical and inguinal fat hernias.
6. Indeterminate 2 cm left adrenal nodule. Adrenal dedicated imaging
recommended.

## 2023-04-02 ENCOUNTER — Inpatient Hospital Stay (HOSPITAL_BASED_OUTPATIENT_CLINIC_OR_DEPARTMENT_OTHER): Payer: BC Managed Care – PPO | Admitting: Hematology

## 2023-04-02 ENCOUNTER — Encounter: Payer: Self-pay | Admitting: Hematology

## 2023-04-02 ENCOUNTER — Inpatient Hospital Stay: Payer: BC Managed Care – PPO

## 2023-04-02 VITALS — BP 136/86 | HR 65 | Temp 100.0°F | Resp 16 | Wt 175.0 lb

## 2023-04-02 DIAGNOSIS — C163 Malignant neoplasm of pyloric antrum: Secondary | ICD-10-CM

## 2023-04-02 DIAGNOSIS — Z5112 Encounter for antineoplastic immunotherapy: Secondary | ICD-10-CM | POA: Diagnosis not present

## 2023-04-02 DIAGNOSIS — Z95828 Presence of other vascular implants and grafts: Secondary | ICD-10-CM

## 2023-04-02 LAB — CMP (CANCER CENTER ONLY)
ALT: 9 U/L (ref 0–44)
AST: 18 U/L (ref 15–41)
Albumin: 4 g/dL (ref 3.5–5.0)
Alkaline Phosphatase: 86 U/L (ref 38–126)
Anion gap: 6 (ref 5–15)
BUN: 11 mg/dL (ref 6–20)
CO2: 27 mmol/L (ref 22–32)
Calcium: 9.2 mg/dL (ref 8.9–10.3)
Chloride: 107 mmol/L (ref 98–111)
Creatinine: 0.86 mg/dL (ref 0.61–1.24)
GFR, Estimated: >60 mL/min (ref >60)
Glucose, Bld: 96 mg/dL (ref 70–99)
Potassium: 4.3 mmol/L (ref 3.5–5.1)
Sodium: 140 mmol/L (ref 135–145)
Total Bilirubin: 0.3 mg/dL (ref 0.0–1.2)
Total Protein: 7.5 g/dL (ref 6.5–8.1)

## 2023-04-02 LAB — CBC WITH DIFFERENTIAL (CANCER CENTER ONLY)
Abs Immature Granulocytes: 0.02 10*3/uL (ref 0.00–0.07)
Basophils Absolute: 0 10*3/uL (ref 0.0–0.1)
Basophils Relative: 1 %
Eosinophils Absolute: 0.1 10*3/uL (ref 0.0–0.5)
Eosinophils Relative: 3 %
HCT: 30.1 % — ABNORMAL LOW (ref 39.0–52.0)
Hemoglobin: 9.5 g/dL — ABNORMAL LOW (ref 13.0–17.0)
Immature Granulocytes: 1 %
Lymphocytes Relative: 34 %
Lymphs Abs: 1.3 10*3/uL (ref 0.7–4.0)
MCH: 26.1 pg (ref 26.0–34.0)
MCHC: 31.6 g/dL (ref 30.0–36.0)
MCV: 82.7 fL (ref 80.0–100.0)
Monocytes Absolute: 0.6 10*3/uL (ref 0.1–1.0)
Monocytes Relative: 16 %
Neutro Abs: 1.8 10*3/uL (ref 1.7–7.7)
Neutrophils Relative %: 45 %
Platelet Count: 267 10*3/uL (ref 150–400)
RBC: 3.64 MIL/uL — ABNORMAL LOW (ref 4.22–5.81)
RDW: 19.3 % — ABNORMAL HIGH (ref 11.5–15.5)
Smear Review: NORMAL
WBC Count: 3.8 10*3/uL — ABNORMAL LOW (ref 4.0–10.5)
nRBC: 0 % (ref 0.0–0.2)

## 2023-04-02 LAB — TSH: TSH: 1.335 u[IU]/mL (ref 0.350–4.500)

## 2023-04-02 LAB — TOTAL PROTEIN, URINE DIPSTICK: Protein, ur: NEGATIVE mg/dL

## 2023-04-02 MED ORDER — ACETAMINOPHEN 325 MG PO TABS
650.0000 mg | ORAL_TABLET | Freq: Once | ORAL | Status: AC
  Administered 2023-04-02: 650 mg via ORAL
  Filled 2023-04-02: qty 2

## 2023-04-02 MED ORDER — HEPARIN SOD (PORK) LOCK FLUSH 100 UNIT/ML IV SOLN
500.0000 [IU] | Freq: Once | INTRAVENOUS | Status: AC | PRN
Start: 2023-04-02 — End: 2023-04-02
  Administered 2023-04-02: 500 [IU]

## 2023-04-02 MED ORDER — SODIUM CHLORIDE 0.9 % IV SOLN
8.0000 mg/kg | Freq: Once | INTRAVENOUS | Status: AC
  Administered 2023-04-02: 700 mg via INTRAVENOUS
  Filled 2023-04-02: qty 20

## 2023-04-02 MED ORDER — SODIUM CHLORIDE 0.9 % IV SOLN
100.0000 mg/m2 | Freq: Once | INTRAVENOUS | Status: AC
Start: 2023-04-02 — End: 2023-04-02
  Administered 2023-04-02: 198 mg via INTRAVENOUS
  Filled 2023-04-02: qty 33

## 2023-04-02 MED ORDER — SODIUM CHLORIDE 0.9 % IV SOLN
240.0000 mg | Freq: Once | INTRAVENOUS | Status: AC
  Administered 2023-04-02: 240 mg via INTRAVENOUS
  Filled 2023-04-02: qty 24

## 2023-04-02 MED ORDER — FAMOTIDINE IN NACL 20-0.9 MG/50ML-% IV SOLN
20.0000 mg | Freq: Once | INTRAVENOUS | Status: AC
  Administered 2023-04-02: 20 mg via INTRAVENOUS
  Filled 2023-04-02: qty 50

## 2023-04-02 MED ORDER — DIPHENHYDRAMINE HCL 50 MG/ML IJ SOLN
25.0000 mg | Freq: Once | INTRAMUSCULAR | Status: AC
  Administered 2023-04-02: 25 mg via INTRAVENOUS
  Filled 2023-04-02: qty 1

## 2023-04-02 MED ORDER — SODIUM CHLORIDE 0.9% FLUSH
10.0000 mL | Freq: Once | INTRAVENOUS | Status: AC
  Administered 2023-04-02: 10 mL

## 2023-04-02 MED ORDER — SODIUM CHLORIDE 0.9 % IV SOLN: INTRAVENOUS | Status: DC

## 2023-04-02 MED ORDER — SODIUM CHLORIDE 0.9% FLUSH
10.0000 mL | INTRAVENOUS | Status: DC | PRN
  Administered 2023-04-02: 10 mL

## 2023-04-02 NOTE — Assessment & Plan Note (Signed)
 cT3N3Mx, with hypermetabolic left supraclavicular node, and indeterminate adrenal nodule, MMR normal ypT3ypN3a, liver and nodes recurrence in 07/2022 -diagnosed 10/18/21 by EGD for 6 month f/u of gastric ulcers, showed mucosal intramucosal adenocarcinoma. MMR normal. -baseline CEA 11/01/21 significantly elevated at 1,989.58. -EUS on 11/06/21 by Dr. Dulce Sellar, staged as T3 N3 (at least stage III) -staging CT CAP 11/08/21 showed: stable gastric antrum wall thickening; increased size of upper abdominal lymph nodes; stable left adrenal nodule, 1.9 cm. -PET scan showed FDG avid lymph nodes within the gastrohepatic ligament, portacaval region, and mesentery. Tracer avid left supraclavicular lymph node and indeterminate left adrenal gland nodule with mild FDG uptake. -he underwent [place] node biopsy on 1/17 which was negative for malignant cells, but no lymphoid tissue seen on biopsy  -repeated PET on 02/07/22 after 3 months neoadjuvant chemo showed resolved left supraclavicular lymph node, and significant improvement in regional lymph nodes and the primary tumor, stable and indeterminate left adrenal nodule.  -CT adrenal protocol showed the left adrenal nodule is likely benign adenoma, no biopsy is needed. -Completed neoadjvant chemo FLOT s/p 12 cycles 11/22/21 - 04/27/2022 -restaging PET on 05/16/22 showed significantly hypermetabolic activity at the primary gastric cancer in pylorus, and a small hypermetabolic mesenteric lymph node, no other evidence of metastasis. surgeon Dr. Sheliah Hatch aware of PET findings -S/p distal gastrectomy 05/20/22, path showed ypT3N3a with clear margins, 7/18 + LNs, and lymphovascular invasion identified. There was some treatment effect in a few lymph nodes nut not in the primary tumor -due to rising tumor marker, he underwent PET scan on July 07, 2022, which unfortunately showed hypermetabolic new adenopathy in right hilar and mesentery, diffuse liver metastasis, consistent with metastatic  recurrence.  I personally reviewed the PET scan images with patient -Unfortunately his disease is not curable at this stage. -The goal of chemotherapy is palliative, to prolong his life -I discussed his next generation sequencing foundation one result, which showed EGFR amplification, no other targetable mutations.  His tumor was previously tested for HER2 which was negative (0-1+).  PD-L1 CPS score was 2%, no significant benefit of immunotherapy.  -He started paclitaxel and ramucirumab on 07/25/2022  He developed worsening neuropathy quickly, and I had to switch his treatment to FOLFIRI on 08/29/2022, every 2 weeks, he is overall tolerating well. -restaging CT 01/19/2023 showed disease progression with new pathologic right paratracheal mediastinal lymph nodes as well as increasing size of several liver metastases. -I changed his chemo to taxol and ramucizumab on 02/05/2023, every 2 weeks per his request

## 2023-04-02 NOTE — Progress Notes (Signed)
 [facility]   [phone] [phone]   Clinic Follow up Note   Patient Care Team: Pcp, No as PCP - Serita Grit, MD as Consulting Physician (Hematology and Oncology)  Date of Service:  04/02/2023  CHIEF COMPLAINT: f/u of gastric cancer  CURRENT THERAPY:  Second line chemotherapy paclitaxel, nivolumab and ramucirumab, every 2 weeks  Oncology History   Gastric cancer (HCC) cT3N3Mx, with hypermetabolic left supraclavicular node, and indeterminate adrenal nodule, MMR normal ypT3ypN3a, liver and nodes recurrence in 07/2022 -diagnosed 10/18/21 by EGD for 6 month f/u of gastric ulcers, showed mucosal intramucosal adenocarcinoma. MMR normal. -baseline CEA 11/01/21 significantly elevated at 1,989.58. -EUS on 11/06/21 by Dr. Dulce Sellar, staged as T3 N3 (at least stage III) -staging CT CAP 11/08/21 showed: stable gastric antrum wall thickening; increased size of upper abdominal lymph nodes; stable left adrenal nodule, 1.9 cm. -PET scan showed FDG avid lymph nodes within the gastrohepatic ligament, portacaval region, and mesentery. Tracer avid left supraclavicular lymph node and indeterminate left adrenal gland nodule with mild FDG uptake. -he underwent [place] node biopsy on 1/17 which was negative for malignant cells, but no lymphoid tissue seen on biopsy  -repeated PET on 02/07/22 after 3 months neoadjuvant chemo showed resolved left supraclavicular lymph node, and significant improvement in regional lymph nodes and the primary tumor, stable and indeterminate left adrenal nodule.  -CT adrenal protocol showed the left adrenal nodule is likely benign adenoma, no biopsy is needed. -Completed neoadjvant chemo FLOT s/p 12 cycles 11/22/21 - 04/27/2022 -restaging PET on 05/16/22 showed significantly hypermetabolic activity at the primary gastric cancer in pylorus, and a small hypermetabolic mesenteric lymph node, no other evidence of metastasis. surgeon Dr. Sheliah Hatch aware of  PET findings -S/p distal gastrectomy 05/20/22, path showed ypT3N3a with clear margins, 7/18 + LNs, and lymphovascular invasion identified. There was some treatment effect in a few lymph nodes nut not in the primary tumor -due to rising tumor marker, he underwent PET scan on July 07, 2022, which unfortunately showed hypermetabolic new adenopathy in right hilar and mesentery, diffuse liver metastasis, consistent with metastatic recurrence.  I personally reviewed the PET scan images with patient -Unfortunately his disease is not curable at this stage. -The goal of chemotherapy is palliative, to prolong his life -I discussed his next generation sequencing foundation one result, which showed EGFR amplification, no other targetable mutations.  His tumor was previously tested for HER2 which was negative (0-1+).  PD-L1 CPS score was 2%, no significant benefit of immunotherapy.  -He started paclitaxel and ramucirumab on 07/25/2022  He developed worsening neuropathy quickly, and I had to switch his treatment to FOLFIRI on 08/29/2022, every 2 weeks, he is overall tolerating well. -restaging CT 01/19/2023 showed disease progression with new pathologic right paratracheal mediastinal lymph nodes as well as increasing size of several liver metastases. -I changed his chemo to taxol and ramucizumab on 02/05/2023, every 2 weeks per his request     Assessment and Plan    Metastatic gastric cancer Undergoing chemotherapy with significant decrease in cancer marker from 600 to 200, indicating a positive response. Experiencing neuropathy and oral mucositis as side effects. Neuropathy affects legs and hands, exacerbated by cold and activity, managed with gabapentin and acetaminophen. Oral mucositis causes gum swelling and pain, managed with non-alcoholic mouthwash and acetaminophen. Scheduled for a scan on April 2nd to assess cancer status. Goal is shrinkage or stable disease. If ineffective, alternatives include oral chemotherapy  ([place]) or other IV chemotherapy. Informed consent includes potential  need to change treatment if current regimen fails to achieve desired outcomes. - Continue current chemotherapy regimen. - [facility]ill [facility] - Use non-alcoholic mouthwash 3-4 times daily. - Schedule scan for April 2nd at 7 AM. - Follow up in two weeks to discuss scan results.  Chemotherapy-induced neuropathy Intermittent neuropathy in legs and hands, exacerbated by cold and activity. Managed with gabapentin and acetaminophen. Able to continue working, takes breaks to manage symptoms. Ice and hot water used for relief. Neuropathy not severe enough to change chemotherapy dosage, but advised to report worsening symptoms. Informed consent includes potential dose reduction or cessation if neuropathy worsens significantly. - Continue gabapentin and acetaminophen for neuropathy management. - Use ice during chemotherapy infusion to reduce drug exposure to hands and feet.  Oral mucositis Swelling and pain in gums likely due to chemotherapy. Symptoms improved with non-alcoholic mouthwash and acetaminophen. Advised to continue using mouthwash to reduce inflammation and prevent infection. - Use non-alcoholic mouthwash 3-4 times daily. - Refill magic mouthwash prescription at the [facility].     Plan -Lab reviewed, adequate for treatment, will proceed to chemo today and continue every 2 weeks -Restaging CT scan scheduled for next week -Follow-up in 2 weeks    SUMMARY OF ONCOLOGIC HISTORY: Oncology History Overview Note   Cancer Staging  Gastric cancer [facility]) Staging form: Stomach, AJCC 8th Edition - Clinical stage from 10/18/2021: Stage III (cT3, cN3a, cM0) - Signed by Malachy Mood, MD on 11/10/2021     Gastric cancer (HCC)  10/18/2021 Procedure   EGD performed under the care of Dr. Bosie Clos  Findings:  -A large, ulcerated, partially circumferential (involving two thirds of the  lumen circumference) mass with no bleeding and stigmata of recent bleeding was found in the gastric antrum, at the pylorus and in the prepyloric region of the stomach. -Segmental severe inflammation characterized by congestion (edema) and erythema was found in the gastric antrum.   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 Negative for dysplasia.  Negative for helicobacter pylori  Mismatch Repair (MMR) Protein Imunohistochemistry (IHC): IHC Expression Result: MLH1: Preserved nuclear expression. MSH2: Preserved nuclear expression. MSH6: Preserved nuclear expression. PMS2: Preserved nuclear expression. Interpretation: NORMAL   10/31/2021 Initial Diagnosis   Gastric cancer (HCC)   11/01/2021 Tumor Marker   CEA = 1,610.96 (^)   11/06/2021 Procedure   Upper EUS, Dr. Dulce Sellar  Impression: - Normal esophagus. - A small amount of food (residue) in the stomach. - Congested, friable (with contact bleeding) and ulcerated mucosa in the prepyloric region of the stomach. - Normal examined duodenum. - There was no evidence of significant pathology in the left lobe of the liver. - Many abnormal lymph nodes (over 10) were visualized in the gastrohepatic ligament (level 18), celiac region (level 20), perigastric region, peripancreatic region and aortocaval region. - Wall thickening was seen in the prepyloric region of the stomach. The thickening appeared to be primarily within the deep mucosa (Layer 2) but extended through the muscularis propria. - Overall constellation of findings  consistent with at least T3 N3 Mx (at least stage III) gastric adenocarcinoma.   11/08/2021 Imaging  EXAM: CT CHEST, ABDOMEN, AND PELVIS WITH CONTRAST  IMPRESSION: 1. Similar irregular wall thickening of the gastric antrum, consistent with patient's known primary gastric neoplasm. 2. Increased size of the upper abdominal lymph nodes, concerning for worsening nodal disease involvement. 3. Stable left adrenal nodule common nonspecific possibly a metastatic lesion or adenoma. 4. No evidence of metastatic disease in the chest. 5. Left-sided colonic diverticulosis without findings of acute diverticulitis. 6. Enlarged prostate gland. 7.  Aortic Atherosclerosis (ICD10-I70.0).   11/22/2021 - 04/26/2022 Chemotherapy   Patient is on Treatment Plan : GASTROESOPHAGEAL FLOT q14d X 4 cycles     11/27/2021 Imaging    IMPRESSION: 1. The mass within the gastric antrum is mildly decreased in size when compared with 11/08/2021. There is persistent FDG uptake associated with this mass compatible with residual tumor. 2. Persistent FDG avid lymph nodes within the gastrohepatic ligament, portacaval region, and mesentery. These are mildly decreased in size when compared with 11/08/2021. 3. Tracer avid left supraclavicular lymph node is also mildly decreased in size when compared with 11/08/2021. 4. Indeterminate left adrenal gland nodule with mild FDG uptake. This may represent a lipid poor adenoma. Metastatic disease not exclude. 5. Diffuse increased uptake throughout the bone marrow is favored to represent treatment related change. 6.  Aortic Atherosclerosis (ICD10-I70.0).     02/07/2022 Imaging    IMPRESSION: 1. No substantial change in hypermetabolism associated with the distal gastric lesion. Although difficult to discern on noncontrast CT imaging, the soft tissue component appears to be decreased since the prior PET-CT. 2. Interval resolution of the hypermetabolic left  supraclavicular lymph node. 3. Interval marked decrease of the hypermetabolic gastrohepatic ligament lymphadenopathy. Similar decrease in size and hypermetabolism associated with index nodes identified previously in the 4. porta hepatis and hypogastric region. 5. Stable left adrenal nodule with slight hypermetabolism. Nonspecific finding could represent lipid poor adenoma or metastatic deposit. 6. No new suspicious hypermetabolic disease on today's study. 7.  Aortic Atherosclerosis (ICD10-I70.0).   07/25/2022 - 08/15/2022 Chemotherapy   Patient is on Treatment Plan : GASTROESOPHAGEAL Ramucirumab D1, 15 + Paclitaxel D1,8,15 q28d     08/29/2022 - 01/24/2023 Chemotherapy   Patient is on Treatment Plan : COLORECTAL FOLFIRI q14d     02/05/2023 -  Chemotherapy   Patient is on Treatment Plan : GASTROESOPHAGEAL Ramucirumab D1, 15 + Paclitaxel D1,8,15 q28d        Discussed the use of AI scribe software for clinical note transcription with the patient, who gave verbal consent to proceed.  History of Present Illness   A 53 year old patient with a history of metastatic gastric cancer presents for a follow-up visit. The patient reports experiencing body aches and neuropathy, which at times worsens, affecting his mobility and daily activities. The patient notes that the symptoms seem to worsen with cold and physical activity. Despite the discomfort, the patient is able to complete his work shifts and maintains a good appetite. The patient is currently on gabapentin and Tylenol for pain management, which he reports as helpful.  The patient also reports a recent issue with gum swelling, which he believes may be a side effect of the chemotherapy. The swelling was initially painful and made eating difficult, but has since improved with the use of non-alcoholic mouthwash and Tylenol. However, the swelling persists. The patient has no other complaints related to the chemotherapy, such as nausea or  diarrhea.  The patient also reports no falls or stomach pain. The patient's neuropathy primarily affects his feet, with less impact on  his hands. The patient remains active despite the neuropathy and uses hot water for relief at home. The patient has not been using ice during chemotherapy infusions.         All other systems were reviewed with the patient and are negative.  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    SURGICAL HISTORY: Past Surgical History:  Procedure Laterality Date   BIOPSY  04/12/2021   Procedure: BIOPSY;  Surgeon: Charlott Rakes, MD;  Location: [facility];  Service: Gastroenterology;;   ESOPHAGOGASTRODUODENOSCOPY N/A 04/12/2021   Procedure: ESOPHAGOGASTRODUODENOSCOPY (EGD);  Surgeon: Charlott Rakes, MD;  Location: [facility];  Service: Gastroenterology;  Laterality: N/A;   ESOPHAGOGASTRODUODENOSCOPY N/A 11/06/2021   Procedure: ESOPHAGOGASTRODUODENOSCOPY (EGD);  Surgeon: Willis Modena, MD;  Location: [facility];  Service: Gastroenterology;  Laterality: N/A;   GASTRECTOMY N/A 05/20/2022   Procedure: OPEN PARTIAL GASTRECTOMY WITH GASTROJEJUNAL ANASTOMOSIS;  Surgeon: Sheliah Hatch, De Blanch, MD;  Location: [facility];  Service: General;  Laterality: N/A;   HERNIA REPAIR Left    inguinal   IR IMAGING GUIDED PORT INSERTION  11/15/2021   LAPAROSCOPY N/A 05/20/2022   Procedure: LAPAROSCOPY DIAGNOSTIC;  Surgeon: Rodman Pickle, MD;  Location: [facility];  Service: General;  Laterality: N/A;   UPPER ESOPHAGEAL ENDOSCOPIC ULTRASOUND (EUS) Bilateral 11/06/2021   Procedure: UPPER ESOPHAGEAL ENDOSCOPIC ULTRASOUND (EUS);  Surgeon: Willis Modena, MD;  Location: [facility];  Service:  Gastroenterology;  Laterality: Bilateral;    I have reviewed the social history and family history with the patient and they are unchanged from previous note.  ALLERGIES:  has no known allergies.  MEDICATIONS:  Current Outpatient Medications  Medication Sig Dispense Refill   acetaminophen (TYLENOL) 500 MG tablet Take 1 tablet (500 mg total) by mouth 2 (two) times daily. 60 tablet 3   gabapentin (NEURONTIN) 300 MG capsule Take 1 capsule (300 mg total) by mouth 3 (three) times daily. 90 capsule 2   ondansetron (ZOFRAN-ODT) 4 MG disintegrating tablet Dissolve 1 tablet (4 mg total) by mouth every 6 (six) hours as needed for nausea or vomiting. 30 tablet 1   pantoprazole (PROTONIX) 40 MG tablet Take 1 tablet (40 mg) by mouth 2 times daily. 60 tablet 6   prochlorperazine (COMPAZINE) 10 MG tablet Take 1 tablet (10 mg total) by mouth every 6 (six) hours as needed for nausea or vomiting. 30 tablet 2   No current facility-administered medications for this visit.    PHYSICAL EXAMINATION: ECOG PERFORMANCE STATUS: 1 - Symptomatic but completely ambulatory  Vitals:   04/02/23 0805  BP: 136/86  Pulse: 65  Resp: 16  Temp: 100 F (37.8 C)  SpO2: 100%   Wt Readings from Last 3 Encounters:  04/02/23 175 lb (79.4 kg)  03/19/23 177 lb 6.4 oz (80.5 kg)  03/05/23 181 lb 12.8 oz (82.5 kg)     GENERAL:alert, no distress and comfortable SKIN: skin color, texture, turgor are normal, no rashes or significant lesions EYES: normal, Conjunctiva are pink and non-injected, sclera clear NECK: supple, thyroid normal size, non-tender, without nodularity LYMPH:  no palpable lymphadenopathy in the cervical, axillary  LUNGS: clear to auscultation and percussion with normal breathing effort HEART: regular rate & rhythm and  no murmurs and no lower extremity edema ABDOMEN:abdomen soft, non-tender and normal bowel sounds Musculoskeletal:no cyanosis of digits and no clubbing  NEURO: alert & oriented x 3 with  fluent speech, no focal motor/sensory deficits except slightly decreased vibration sensation in both hands  LABORATORY DATA:  I have reviewed the data as listed    Latest Ref Rng & Units 04/02/2023    7:37 AM 03/19/2023    9:07 AM 03/05/2023    8:35 AM  CBC  WBC 4.0 - 10.5 K/uL 3.8  4.4  4.0   Hemoglobin 13.0 - 17.0 g/dL 9.5  9.8  16.1   Hematocrit 39.0 - 52.0 % 30.1  30.5  31.6   Platelets 150 - 400 K/uL 267  257  244         Latest Ref Rng & Units 04/02/2023    7:37 AM 03/19/2023    9:07 AM 03/05/2023    8:35 AM  CMP  Glucose 70 - 99 mg/dL 96  70  096   BUN 6 - 20 mg/dL 11  7  6    Creatinine 0.61 - 1.24 mg/dL 0.45  4.09  8.11   Sodium 135 - 145 mmol/L 140  140  139   Potassium 3.5 - 5.1 mmol/L 4.3  4.3  3.8   Chloride 98 - 111 mmol/L 107  109  108   CO2 22 - 32 mmol/L 27  27  27    Calcium 8.9 - 10.3 mg/dL 9.2  8.6  8.9   Total Protein 6.5 - 8.1 g/dL 7.5  7.2  6.9   Total Bilirubin 0.0 - 1.2 mg/dL 0.3  0.5  0.4   Alkaline Phos 38 - 126 U/L 86  95  105   AST 15 - 41 U/L 18  22  16    ALT 0 - 44 U/L 9  11  9        RADIOGRAPHIC STUDIES: I have personally reviewed the radiological images as listed and agreed with the findings in the report. No results found.    No orders of the defined types were placed in this encounter.  All questions were answered. The patient knows to call the clinic with any problems, questions or concerns. No barriers to learning was detected. The total time spent in the appointment was 30 minutes.     Malachy Mood, MD 04/02/2023

## 2023-04-03 LAB — T4: T4, Total: 7.3 ug/dL (ref 4.5–12.0)

## 2023-04-06 ENCOUNTER — Other Ambulatory Visit: Payer: Self-pay | Admitting: Nurse Practitioner

## 2023-04-06 MED ORDER — GABAPENTIN 300 MG PO CAPS: 300.0000 mg | ORAL_CAPSULE | Freq: Three times a day (TID) | ORAL | 2 refills | Status: DC

## 2023-04-08 ENCOUNTER — Ambulatory Visit (HOSPITAL_COMMUNITY)
Admission: RE | Admit: 2023-04-08 | Discharge: 2023-04-08 | Disposition: A | Discharge status: Home / Self Care | Source: Ambulatory Visit | Attending: Hematology | Admitting: Hematology

## 2023-04-08 DIAGNOSIS — C163 Malignant neoplasm of pyloric antrum: Secondary | ICD-10-CM | POA: Diagnosis present

## 2023-04-08 MED ORDER — SODIUM CHLORIDE (PF) 0.9 % IJ SOLN
INTRAMUSCULAR | Status: AC
  Filled 2023-04-08: qty 50

## 2023-04-08 MED ORDER — IOHEXOL 300 MG/ML  SOLN
100.0000 mL | Freq: Once | INTRAMUSCULAR | Status: AC | PRN
  Administered 2023-04-08: 100 mL via INTRAVENOUS

## 2023-04-11 ENCOUNTER — Other Ambulatory Visit: Payer: Self-pay

## 2023-04-15 NOTE — Assessment & Plan Note (Addendum)
  cT3N3Mx, with hypermetabolic left supraclavicular node, and indeterminate adrenal nodule, MMR normal ypT3ypN3a, liver and nodes recurrence in 07/2022 -diagnosed 10/18/21 by EGD for 6 month f/u of gastric ulcers, showed mucosal intramucosal adenocarcinoma. MMR normal. -baseline CEA 11/01/21 significantly elevated at 1,989.58. -EUS on 11/06/21 by Dr. Dulce Sellar, staged as T3 N3 (at least stage III) -staging CT CAP 11/08/21 showed: stable gastric antrum wall thickening; increased size of upper abdominal lymph nodes; stable left adrenal nodule, 1.9 cm. -PET scan showed FDG avid lymph nodes within the gastrohepatic ligament, portacaval region, and mesentery. Tracer avid left supraclavicular lymph node and indeterminate left adrenal gland nodule with mild FDG uptake. -he underwent [place] node biopsy on 1/17 which was negative for malignant cells, but no lymphoid tissue seen on biopsy  -repeated PET on 02/07/22 after 3 months neoadjuvant chemo showed resolved left supraclavicular lymph node, and significant improvement in regional lymph nodes and the primary tumor, stable and indeterminate left adrenal nodule.  -CT adrenal protocol showed the left adrenal nodule is likely benign adenoma, no biopsy is needed. -Completed neoadjvant chemo FLOT s/p 12 cycles 11/22/21 - 04/27/2022 -restaging PET on 05/16/22 showed significantly hypermetabolic activity at the primary gastric cancer in pylorus, and a small hypermetabolic mesenteric lymph node, no other evidence of metastasis. surgeon Dr. Sheliah Hatch aware of PET findings -S/p distal gastrectomy 05/20/22, path showed ypT3N3a with clear margins, 7/18 + LNs, and lymphovascular invasion identified. There was some treatment effect in a few lymph nodes nut not in the primary tumor -due to rising tumor marker, he underwent PET scan on July 07, 2022, which unfortunately showed hypermetabolic new adenopathy in right hilar and mesentery, diffuse liver metastasis, consistent with metastatic  recurrence.  I personally reviewed the PET scan images with patient -Unfortunately his disease is not curable at this stage. -The goal of chemotherapy is palliative, to prolong his life -I discussed his next generation sequencing foundation one result, which showed EGFR amplification, no other targetable mutations.  His tumor was previously tested for HER2 which was negative (0-1+).  PD-L1 CPS score was 2%, no significant benefit of immunotherapy.  -He started paclitaxel and ramucirumab on 07/25/2022  He developed worsening neuropathy quickly, and I had to switch his treatment to FOLFIRI on 08/29/2022, every 2 weeks, he is overall tolerating well. -restaging CT 01/19/2023 showed disease progression with new pathologic right paratracheal mediastinal lymph nodes as well as increasing size of several liver metastases. -I changed his chemo to taxol and ramucizumab on 02/05/2023, every 2 weeks per his request  -restaging CT 04/08/2023 showed partial response in liver and mediastinal nodes

## 2023-04-16 ENCOUNTER — Inpatient Hospital Stay: Payer: BC Managed Care – PPO

## 2023-04-16 ENCOUNTER — Inpatient Hospital Stay: Payer: BC Managed Care – PPO | Attending: Physician Assistant | Admitting: Hematology

## 2023-04-16 ENCOUNTER — Encounter: Payer: Self-pay | Admitting: Hematology

## 2023-04-16 ENCOUNTER — Encounter: Payer: Self-pay | Admitting: Physician Assistant

## 2023-04-16 ENCOUNTER — Other Ambulatory Visit (HOSPITAL_COMMUNITY): Payer: Self-pay

## 2023-04-16 ENCOUNTER — Other Ambulatory Visit: Payer: Self-pay

## 2023-04-16 VITALS — BP 131/82 | HR 70 | Temp 98.5°F | Resp 17 | Wt 174.6 lb

## 2023-04-16 DIAGNOSIS — Z5111 Encounter for antineoplastic chemotherapy: Secondary | ICD-10-CM | POA: Insufficient documentation

## 2023-04-16 DIAGNOSIS — G620 Drug-induced polyneuropathy: Secondary | ICD-10-CM | POA: Diagnosis not present

## 2023-04-16 DIAGNOSIS — Z95828 Presence of other vascular implants and grafts: Secondary | ICD-10-CM

## 2023-04-16 DIAGNOSIS — D649 Anemia, unspecified: Secondary | ICD-10-CM | POA: Insufficient documentation

## 2023-04-16 DIAGNOSIS — C163 Malignant neoplasm of pyloric antrum: Secondary | ICD-10-CM

## 2023-04-16 DIAGNOSIS — Z7952 Long term (current) use of systemic steroids: Secondary | ICD-10-CM | POA: Diagnosis not present

## 2023-04-16 DIAGNOSIS — T451X5D Adverse effect of antineoplastic and immunosuppressive drugs, subsequent encounter: Secondary | ICD-10-CM | POA: Insufficient documentation

## 2023-04-16 DIAGNOSIS — Z5112 Encounter for antineoplastic immunotherapy: Secondary | ICD-10-CM | POA: Diagnosis present

## 2023-04-16 DIAGNOSIS — E278 Other specified disorders of adrenal gland: Secondary | ICD-10-CM | POA: Insufficient documentation

## 2023-04-16 DIAGNOSIS — Z79899 Other long term (current) drug therapy: Secondary | ICD-10-CM | POA: Insufficient documentation

## 2023-04-16 LAB — CMP (CANCER CENTER ONLY)
ALT: 10 U/L (ref 0–44)
AST: 20 U/L (ref 15–41)
Albumin: 4 g/dL (ref 3.5–5.0)
Alkaline Phosphatase: 80 U/L (ref 38–126)
Anion gap: 5 (ref 5–15)
BUN: 8 mg/dL (ref 6–20)
CO2: 26 mmol/L (ref 22–32)
Calcium: 9 mg/dL (ref 8.9–10.3)
Chloride: 110 mmol/L (ref 98–111)
Creatinine: 0.74 mg/dL (ref 0.61–1.24)
GFR, Estimated: >60 mL/min (ref >60)
Glucose, Bld: 112 mg/dL — ABNORMAL HIGH (ref 70–99)
Potassium: 4.2 mmol/L (ref 3.5–5.1)
Sodium: 141 mmol/L (ref 135–145)
Total Bilirubin: 0.4 mg/dL (ref 0.0–1.2)
Total Protein: 7.4 g/dL (ref 6.5–8.1)

## 2023-04-16 LAB — CBC WITH DIFFERENTIAL (CANCER CENTER ONLY)
Abs Immature Granulocytes: 0.01 10*3/uL (ref 0.00–0.07)
Basophils Absolute: 0 10*3/uL (ref 0.0–0.1)
Basophils Relative: 1 %
Eosinophils Absolute: 0.1 10*3/uL (ref 0.0–0.5)
Eosinophils Relative: 3 %
HCT: 29.7 % — ABNORMAL LOW (ref 39.0–52.0)
Hemoglobin: 9.5 g/dL — ABNORMAL LOW (ref 13.0–17.0)
Immature Granulocytes: 0 %
Lymphocytes Relative: 32 %
Lymphs Abs: 1.3 10*3/uL (ref 0.7–4.0)
MCH: 26.1 pg (ref 26.0–34.0)
MCHC: 32 g/dL (ref 30.0–36.0)
MCV: 81.6 fL (ref 80.0–100.0)
Monocytes Absolute: 0.5 10*3/uL (ref 0.1–1.0)
Monocytes Relative: 12 %
Neutro Abs: 2.1 10*3/uL (ref 1.7–7.7)
Neutrophils Relative %: 52 %
Platelet Count: 240 10*3/uL (ref 150–400)
RBC: 3.64 MIL/uL — ABNORMAL LOW (ref 4.22–5.81)
RDW: 19.5 % — ABNORMAL HIGH (ref 11.5–15.5)
WBC Count: 4 10*3/uL (ref 4.0–10.5)
nRBC: 0 % (ref 0.0–0.2)

## 2023-04-16 LAB — CEA (ACCESS): CEA (CHCC): 103.04 ng/mL — ABNORMAL HIGH (ref 0.00–5.00)

## 2023-04-16 MED ORDER — SODIUM CHLORIDE 0.9 % IV SOLN
240.0000 mg | Freq: Once | INTRAVENOUS | Status: AC
  Administered 2023-04-16: 240 mg via INTRAVENOUS
  Filled 2023-04-16: qty 24

## 2023-04-16 MED ORDER — NYSTATIN 100000 UNIT/ML MT SUSP
5.0000 mL | Freq: Three times a day (TID) | OROMUCOSAL | 0 refills | Status: AC | PRN
  Filled 2023-04-16: qty 140, 10d supply, fill #0

## 2023-04-16 MED ORDER — FAMOTIDINE IN NACL 20-0.9 MG/50ML-% IV SOLN
20.0000 mg | Freq: Once | INTRAVENOUS | Status: AC
  Administered 2023-04-16: 20 mg via INTRAVENOUS
  Filled 2023-04-16: qty 50

## 2023-04-16 MED ORDER — DIPHENHYDRAMINE HCL 50 MG/ML IJ SOLN
25.0000 mg | Freq: Once | INTRAMUSCULAR | Status: AC
  Administered 2023-04-16: 25 mg via INTRAVENOUS
  Filled 2023-04-16: qty 1

## 2023-04-16 MED ORDER — SODIUM CHLORIDE 0.9% FLUSH
10.0000 mL | Freq: Once | INTRAVENOUS | Status: AC
  Administered 2023-04-16: 10 mL

## 2023-04-16 MED ORDER — HEPARIN SOD (PORK) LOCK FLUSH 100 UNIT/ML IV SOLN
500.0000 [IU] | Freq: Once | INTRAVENOUS | Status: AC | PRN
  Administered 2023-04-16: 500 [IU]

## 2023-04-16 MED ORDER — ACETAMINOPHEN 325 MG PO TABS
650.0000 mg | ORAL_TABLET | Freq: Once | ORAL | Status: AC
  Administered 2023-04-16: 650 mg via ORAL
  Filled 2023-04-16: qty 2

## 2023-04-16 MED ORDER — SODIUM CHLORIDE 0.9% FLUSH
10.0000 mL | INTRAVENOUS | Status: DC | PRN
  Administered 2023-04-16 (×2): 10 mL

## 2023-04-16 MED ORDER — SODIUM CHLORIDE 0.9 % IV SOLN
8.0000 mg/kg | Freq: Once | INTRAVENOUS | Status: AC
  Administered 2023-04-16: 700 mg via INTRAVENOUS
  Filled 2023-04-16: qty 20

## 2023-04-16 MED ORDER — SODIUM CHLORIDE 0.9 % IV SOLN
100.0000 mg/m2 | Freq: Once | INTRAVENOUS | Status: AC
  Administered 2023-04-16: 198 mg via INTRAVENOUS
  Filled 2023-04-16: qty 33

## 2023-04-16 MED ORDER — SODIUM CHLORIDE 0.9 % IV SOLN: INTRAVENOUS | Status: DC

## 2023-04-16 NOTE — Patient Instructions (Signed)
 [facility] - A DEPT OF [facility]  Discharge Instructions: Thank you for choosing [facility] to provide your oncology and hematology care.   If you have a lab appointment with the Cancer Center, please go directly to the Cancer Center and check in at the registration area.   Wear comfortable clothing and clothing appropriate for easy access to any Portacath or PICC line.   We strive to give you quality time with your provider. You may need to reschedule your appointment if you arrive late (15 or more minutes).  Arriving late affects you and other patients whose appointments are after yours.  Also, if you miss three or more appointments without notifying the office, you may be dismissed from the clinic at the provider's discretion.      For prescription refill requests, have your pharmacy contact our office and allow 72 hours for refills to be completed.    Today you received the following chemotherapy and/or immunotherapy agents: ramucirumab, nivolumab, paclitaxel       To help prevent nausea and vomiting after your treatment, we encourage you to take your nausea medication as directed.  BELOW ARE SYMPTOMS THAT SHOULD BE REPORTED IMMEDIATELY: *FEVER GREATER THAN 100.4 F (38 C) OR HIGHER *CHILLS OR SWEATING *NAUSEA AND VOMITING THAT IS NOT CONTROLLED WITH YOUR NAUSEA MEDICATION *UNUSUAL SHORTNESS OF BREATH *UNUSUAL BRUISING OR BLEEDING *URINARY PROBLEMS (pain or burning when urinating, or frequent urination) *BOWEL PROBLEMS (unusual diarrhea, constipation, pain near the anus) TENDERNESS IN MOUTH AND THROAT WITH OR WITHOUT PRESENCE OF ULCERS (sore throat, sores in mouth, or a toothache) UNUSUAL RASH, SWELLING OR PAIN  UNUSUAL VAGINAL DISCHARGE OR ITCHING   Items with * indicate a potential emergency and should be followed up as soon as possible or go to the Emergency Department if any problems should occur.  Please show the CHEMOTHERAPY  ALERT CARD or IMMUNOTHERAPY ALERT CARD at check-in to the Emergency Department and triage nurse.  Should you have questions after your visit or need to cancel or reschedule your appointment, please contact [facility] - A DEPT OF [facility]  Dept: [phone]  and follow the prompts.  Office hours are 8:00 a.m. to 4:30 p.m. Monday - Friday. Please note that voicemails left after 4:00 p.m. may not be returned until the following business day.  We are closed weekends and major holidays. You have access to a nurse at all times for urgent questions. Please call the main number to the clinic Dept: [phone] and follow the prompts.   For any non-urgent questions, you may also contact your provider using MyChart. We now offer e-Visits for anyone 88 and older to request care online for non-urgent symptoms. For details visit [email or web address].   Also download the MyChart app! Go to the app store, search "MyChart", open the app, select [facility], and log in with your MyChart username and password.

## 2023-04-16 NOTE — Progress Notes (Signed)
 [facility]   Telephone:(336) [phone] Fax:(336) [phone]   Clinic Follow up Note   Patient Care Team: Pcp, No as PCP - Serita Grit, MD as Consulting Physician (Hematology and Oncology)  Date of Service:  04/16/2023  CHIEF COMPLAINT: f/u of gastric cancer   CURRENT THERAPY:  Paclitaxel and ramucirumab every 2 weeks  Oncology History   Gastric cancer (HCC)  cT3N3Mx, with hypermetabolic left supraclavicular node, and indeterminate adrenal nodule, MMR normal ypT3ypN3a, liver and nodes recurrence in 07/2022 -diagnosed 10/18/21 by EGD for 6 month f/u of gastric ulcers, showed mucosal intramucosal adenocarcinoma. MMR normal. -baseline CEA 11/01/21 significantly elevated at 1,989.58. -EUS on 11/06/21 by Dr. Dulce Sellar, staged as T3 N3 (at least stage III) -staging CT CAP 11/08/21 showed: stable gastric antrum wall thickening; increased size of upper abdominal lymph nodes; stable left adrenal nodule, 1.9 cm. -PET scan showed FDG avid lymph nodes within the gastrohepatic ligament, portacaval region, and mesentery. Tracer avid left supraclavicular lymph node and indeterminate left adrenal gland nodule with mild FDG uptake. -he underwent [place] node biopsy on 1/17 which was negative for malignant cells, but no lymphoid tissue seen on biopsy  -repeated PET on 02/07/22 after 3 months neoadjuvant chemo showed resolved left supraclavicular lymph node, and significant improvement in regional lymph nodes and the primary tumor, stable and indeterminate left adrenal nodule.  -CT adrenal protocol showed the left adrenal nodule is likely benign adenoma, no biopsy is needed. -Completed neoadjvant chemo FLOT s/p 12 cycles 11/22/21 - 04/27/2022 -restaging PET on 05/16/22 showed significantly hypermetabolic activity at the primary gastric cancer in pylorus, and a small hypermetabolic mesenteric lymph node, no other evidence of metastasis. surgeon Dr. Sheliah Hatch aware of PET findings -S/p distal  gastrectomy 05/20/22, path showed ypT3N3a with clear margins, 7/18 + LNs, and lymphovascular invasion identified. There was some treatment effect in a few lymph nodes nut not in the primary tumor -due to rising tumor marker, he underwent PET scan on July 07, 2022, which unfortunately showed hypermetabolic new adenopathy in right hilar and mesentery, diffuse liver metastasis, consistent with metastatic recurrence.  I personally reviewed the PET scan images with patient -Unfortunately his disease is not curable at this stage. -The goal of chemotherapy is palliative, to prolong his life -I discussed his next generation sequencing foundation one result, which showed EGFR amplification, no other targetable mutations.  His tumor was previously tested for HER2 which was negative (0-1+).  PD-L1 CPS score was 2%, no significant benefit of immunotherapy.  -He started paclitaxel and ramucirumab on 07/25/2022  He developed worsening neuropathy quickly, and I had to switch his treatment to FOLFIRI on 08/29/2022, every 2 weeks, he is overall tolerating well. -restaging CT 01/19/2023 showed disease progression with new pathologic right paratracheal mediastinal lymph nodes as well as increasing size of several liver metastases. -I changed his chemo to taxol and ramucizumab on 02/05/2023, every 2 weeks per his request  -restaging CT 04/08/2023 showed partial response in liver and mediastinal nodes    Assessment & Plan Metastatic gastric cancer Undergoing treatment with chemotherapy, showing a positive response with decreased liver lesions and some lymph nodes. Slight increase in lymph nodes near the liver and an adrenal nodule, possibly unrelated to cancer. Tolerating chemotherapy with manageable neuropathy and body aches. The goal is to maintain the current regimen as long as it remains effective and tolerable, with future treatment options available if needed. - Continue current chemotherapy regimen as tolerated. - Use  Tylenol as needed for pain management  of neuropathy and body aches. - Encourage physical activity, balanced diet, and positive outlook. - Discuss potential future changes in treatment if current therapy becomes ineffective or intolerable.  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 - Continue using gloves for neuropathy management. - Adjust management strategies as needed based on symptoms.  Chemotherapy-induced body aches Intermittent body aches likely related to chemotherapy, managed with Tylenol. - Take Tylenol in the morning to manage body aches. - Adjust pain management strategies as needed.  Mild anemia Mild anemia likely related to chemotherapy, not causing significant issues. - Monitor blood counts to assess anemia status.  Medication management issues Issues with refilling gabapentin and magic mouthwash prescriptions. Gabapentin refilled but not through MyChart; magic mouthwash not filled by pharmacy. - Call pharmacy directly for gabapentin refills. - Check with [facility] pharmacy regarding magic mouthwash prescription.   Plan -Restaging CT scan reviewed, overall partial response.   -Lab reviewed, adequate for treatment, will proceed to chemo today and continue every 2 weeks -Follow-up in 2 weeks -Will refill his Magic mouthwash to [facility] pharmacy  SUMMARY OF ONCOLOGIC HISTORY: Oncology History Overview Note   Cancer Staging  Gastric cancer [facility]) Staging form: Stomach, AJCC 8th Edition - Clinical stage from 10/18/2021: Stage III (cT3, cN3a, cM0) - Signed by Malachy Mood, MD on 11/10/2021     Gastric cancer (HCC)  10/18/2021 Procedure   EGD performed under the care of Dr. Bosie Clos  Findings:  -A large, ulcerated, partially circumferential (involving two thirds of the lumen circumference) mass with no bleeding and stigmata of recent bleeding was found in the gastric antrum, at  the pylorus and in the prepyloric region of the stomach. -Segmental severe inflammation characterized by congestion (edema) and erythema was found in the gastric antrum.   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 Negative for dysplasia.  Negative for helicobacter pylori  Mismatch Repair (MMR) Protein Imunohistochemistry (IHC): IHC Expression Result: MLH1: Preserved nuclear expression. MSH2: Preserved nuclear expression. MSH6: Preserved nuclear expression. PMS2: Preserved nuclear expression. Interpretation: NORMAL   10/31/2021 Initial Diagnosis   Gastric cancer (HCC)   11/01/2021 Tumor Marker   CEA = 3,086.57 (^)   11/06/2021 Procedure   Upper EUS, Dr. Dulce Sellar  Impression: - Normal esophagus. - A small amount of food (residue) in the stomach. - Congested, friable (with contact bleeding) and ulcerated mucosa in the prepyloric region of the stomach. - Normal examined duodenum. - There was no evidence of significant pathology in the left lobe of the liver. - Many abnormal lymph nodes (over 10) were visualized in the gastrohepatic ligament (level 18), celiac region (level 20), perigastric region, peripancreatic region and aortocaval region. - Wall thickening was seen in the prepyloric region of the stomach. The thickening appeared to be primarily within the deep mucosa (Layer 2) but extended through the muscularis propria. - Overall constellation of findings consistent with at least T3 N3 Mx (at least stage III) gastric adenocarcinoma.   11/08/2021 Imaging    EXAM: CT CHEST, ABDOMEN, AND PELVIS WITH CONTRAST  IMPRESSION: 1. Similar irregular wall thickening of the gastric antrum, consistent with patient's  known primary gastric neoplasm. 2. Increased size of the upper abdominal lymph nodes, concerning for worsening nodal disease involvement. 3. Stable left adrenal nodule common nonspecific possibly a metastatic lesion or adenoma. 4. No evidence of metastatic disease in the chest. 5. Left-sided colonic diverticulosis without findings of acute diverticulitis. 6. Enlarged prostate gland. 7.  Aortic Atherosclerosis (ICD10-I70.0).   11/22/2021 - 04/26/2022 Chemotherapy   Patient is on Treatment Plan : GASTROESOPHAGEAL FLOT q14d X 4 cycles     11/27/2021 Imaging    IMPRESSION: 1. The mass within the gastric antrum is mildly decreased in size when compared with 11/08/2021. There is persistent FDG uptake associated with this mass compatible with residual tumor. 2. Persistent FDG avid lymph nodes within the gastrohepatic ligament, portacaval region, and mesentery. These are mildly decreased in size when compared with 11/08/2021. 3. Tracer avid left supraclavicular lymph node is also mildly decreased in size when compared with 11/08/2021. 4. Indeterminate left adrenal gland nodule with mild FDG uptake. This may represent a lipid poor adenoma. Metastatic disease not exclude. 5. Diffuse increased uptake throughout the bone marrow is favored to represent treatment related change. 6.  Aortic Atherosclerosis (ICD10-I70.0).     02/07/2022 Imaging    IMPRESSION: 1. No substantial change in hypermetabolism associated with the distal gastric lesion. Although difficult to discern on noncontrast CT imaging, the soft tissue component appears to be decreased since the prior PET-CT. 2. Interval resolution of the hypermetabolic left supraclavicular lymph node. 3. Interval marked decrease of the hypermetabolic gastrohepatic ligament lymphadenopathy.  Similar decrease in size and hypermetabolism associated with index nodes identified previously in the 4. porta hepatis and hypogastric region. 5. Stable left adrenal nodule with slight hypermetabolism. Nonspecific finding could represent lipid poor adenoma or metastatic deposit. 6. No new suspicious hypermetabolic disease on today's study. 7.  Aortic Atherosclerosis (ICD10-I70.0).   07/25/2022 - 08/15/2022 Chemotherapy   Patient is on Treatment Plan : GASTROESOPHAGEAL Ramucirumab D1, 15 + Paclitaxel D1,8,15 q28d     08/29/2022 - 01/24/2023 Chemotherapy   Patient is on Treatment Plan : COLORECTAL FOLFIRI q14d     02/05/2023 -  Chemotherapy   Patient is on Treatment Plan : GASTROESOPHAGEAL Ramucirumab D1, 15 + Paclitaxel D1,8,15 q28d        Discussed the use of AI scribe software for clinical note transcription with the patient, who gave verbal consent to proceed.  History of Present Illness The patient, a 53 year old gentleman with metastatic gastric cancer, presents for a follow-up visit. He reports experiencing body aches, which are not constant but occur frequently. He manages these aches with Tylenol, which he takes as needed when the pain becomes intense. He also experiences neuropathy, a side effect of his chemotherapy treatment. However, he notes that the neuropathy was less severe during his last treatment cycle. He manages the neuropathy by wearing gloves. Despite these side effects, the patient is able to carry out his daily activities.     All other systems were reviewed with the patient and are negative.  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    SURGICAL HISTORY: Past Surgical History:   Procedure Laterality Date   BIOPSY  04/12/2021   Procedure: BIOPSY;  Surgeon: Charlott Rakes, MD;  Location: [facility];  Service: Gastroenterology;;   ESOPHAGOGASTRODUODENOSCOPY N/A 04/12/2021   Procedure: ESOPHAGOGASTRODUODENOSCOPY (EGD);  Surgeon: Charlott Rakes, MD;  Location: [facility];  Service: Gastroenterology;  Laterality: N/A;   ESOPHAGOGASTRODUODENOSCOPY N/A 11/06/2021   Procedure: ESOPHAGOGASTRODUODENOSCOPY (EGD);  Surgeon: Willis Modena, MD;  Location: [facility];  Service: Gastroenterology;  Laterality: N/A;   GASTRECTOMY N/A 05/20/2022   Procedure: OPEN PARTIAL GASTRECTOMY WITH GASTROJEJUNAL ANASTOMOSIS;  Surgeon: Sheliah Hatch, De Blanch, MD;  Location: [facility];  Service: General;  Laterality: N/A;   HERNIA REPAIR Left    inguinal   IR IMAGING GUIDED PORT INSERTION  11/15/2021   LAPAROSCOPY N/A 05/20/2022   Procedure: LAPAROSCOPY DIAGNOSTIC;  Surgeon: Rodman Pickle, MD;  Location: [facility];  Service: General;  Laterality: N/A;   UPPER ESOPHAGEAL ENDOSCOPIC ULTRASOUND (EUS) Bilateral 11/06/2021   Procedure: UPPER ESOPHAGEAL ENDOSCOPIC ULTRASOUND (EUS);  Surgeon: Willis Modena, MD;  Location: [facility];  Service: Gastroenterology;  Laterality: Bilateral;    I have reviewed the social history and family history with the patient and they are unchanged from previous note.  ALLERGIES:  has no known allergies.  MEDICATIONS:  Current Outpatient Medications  Medication Sig Dispense Refill   acetaminophen (TYLENOL) 500 MG tablet Take 1 tablet (500 mg total) by mouth 2 (two) times daily. 60 tablet 3   gabapentin (NEURONTIN) 300 MG capsule Take 1 capsule (300 mg total) by mouth 3 (three) times daily. 90 capsule 2   magic mouthwash w/lidocaine SOLN Take 5 mLs by mouth 3 (three) times daily as needed for mouth pain. Suspension contains equal amounts of Maalox Extra Strength, nystatin, diphenhydramine and lidocaine. Swish for 5 seconds and swallow 140 mL 1    ondansetron (ZOFRAN-ODT) 4 MG disintegrating tablet Dissolve 1 tablet (4 mg total) by mouth every 6 (six) hours as needed for nausea or vomiting. 30 tablet 1   pantoprazole (PROTONIX) 40 MG tablet Take 1 tablet (40 mg) by mouth 2 times daily. 60 tablet 6   prochlorperazine (COMPAZINE) 10 MG tablet Take 1 tablet (10 mg total) by mouth every 6 (six) hours as needed for nausea or vomiting. 30 tablet 2   No current facility-administered medications for this visit.   Facility-Administered Medications Ordered in Other Visits  Medication Dose Route Frequency Provider Last Rate Last Admin   0.9 %  sodium chloride infusion   Intravenous Continuous Malachy Mood, MD 10 mL/hr at 04/16/23 0849 New Bag at 04/16/23 0849   [COMPLETED] heparin lock flush 100 unit/mL  500 Units Intracatheter Once PRN Malachy Mood, MD   500 Units at 04/16/23 1000   nivolumab (OPDIVO) 240 mg in sodium chloride 0.9 % 100 mL chemo infusion  240 mg Intravenous Once Malachy Mood, MD       PACLitaxel (TAXOL) 198 mg in sodium chloride 0.9 % 250 mL chemo infusion (</= 80mg /m2)  100 mg/m2 (Treatment Plan Recorded) Intravenous Once Malachy Mood, MD       ramucirumab [facility]) 700 mg in sodium chloride 0.9 % 180 mL chemo infusion  8 mg/kg (Treatment Plan Recorded) Intravenous Once Malachy Mood, MD       sodium chloride flush (NS) 0.9 % injection 10 mL  10 mL Intracatheter PRN Malachy Mood, MD   10 mL at 04/16/23 1000    PHYSICAL EXAMINATION: ECOG PERFORMANCE STATUS: 1 - Symptomatic but completely ambulatory  Vitals:   04/16/23 0839  BP: 131/82  Pulse: 70  Resp: 17  Temp: 98.5 F (36.9 C)  SpO2: 100%  Wt Readings from Last 3 Encounters:  04/16/23 174 lb 9.6 oz (79.2 kg)  04/02/23 175 lb (79.4 kg)  03/19/23 177 lb 6.4 oz (80.5 kg)     GENERAL:alert, no distress and comfortable SKIN: skin color, texture, turgor are normal, no rashes or significant lesions EYES: normal, Conjunctiva are pink and non-injected, sclera clear NECK: supple, thyroid  normal size, non-tender, without nodularity LYMPH:  no palpable lymphadenopathy in the cervical, axillary  LUNGS: clear to auscultation and percussion with normal breathing effort HEART: regular rate & rhythm and no murmurs and no lower extremity edema ABDOMEN:abdomen soft, non-tender and normal bowel sounds Musculoskeletal:no cyanosis of digits and no clubbing    Physical Exam    LABORATORY DATA:  I have reviewed the data as listed    Latest Ref Rng & Units 04/16/2023    7:42 AM 04/02/2023    7:37 AM 03/19/2023    9:07 AM  CBC  WBC 4.0 - 10.5 K/uL 4.0  3.8  4.4   Hemoglobin 13.0 - 17.0 g/dL 9.5  9.5  9.8   Hematocrit 39.0 - 52.0 % 29.7  30.1  30.5   Platelets 150 - 400 K/uL 240  267  257         Latest Ref Rng & Units 04/16/2023    7:42 AM 04/02/2023    7:37 AM 03/19/2023    9:07 AM  CMP  Glucose 70 - 99 mg/dL 161  96  70   BUN 6 - 20 mg/dL 8  11  7    Creatinine 0.61 - 1.24 mg/dL 0.96  0.45  4.09   Sodium 135 - 145 mmol/L 141  140  140   Potassium 3.5 - 5.1 mmol/L 4.2  4.3  4.3   Chloride 98 - 111 mmol/L 110  107  109   CO2 22 - 32 mmol/L 26  27  27    Calcium 8.9 - 10.3 mg/dL 9.0  9.2  8.6   Total Protein 6.5 - 8.1 g/dL 7.4  7.5  7.2   Total Bilirubin 0.0 - 1.2 mg/dL 0.4  0.3  0.5   Alkaline Phos 38 - 126 U/L 80  86  95   AST 15 - 41 U/L 20  18  22    ALT 0 - 44 U/L 10  9  11        RADIOGRAPHIC STUDIES: I have personally reviewed the radiological images as listed and agreed with the findings in the report. No results found.    No orders of the defined types were placed in this encounter.  All questions were answered. The patient knows to call the clinic with any problems, questions or concerns. No barriers to learning was detected. The total time spent in the appointment was 30 minutes.     Malachy Mood, MD 04/16/2023

## 2023-04-18 ENCOUNTER — Other Ambulatory Visit (HOSPITAL_COMMUNITY): Payer: Self-pay

## 2023-04-20 ENCOUNTER — Encounter: Payer: Self-pay | Admitting: Nurse Practitioner

## 2023-04-21 ENCOUNTER — Other Ambulatory Visit: Payer: Self-pay

## 2023-04-24 ENCOUNTER — Other Ambulatory Visit (HOSPITAL_COMMUNITY): Payer: Self-pay

## 2023-04-29 NOTE — Assessment & Plan Note (Signed)
  cT3N3Mx, with hypermetabolic left supraclavicular node, and indeterminate adrenal nodule, MMR normal ypT3ypN3a, liver and nodes recurrence in 07/2022 -diagnosed 10/18/21 by EGD for 6 month f/u of gastric ulcers, showed mucosal intramucosal adenocarcinoma. MMR normal. -baseline CEA 11/01/21 significantly elevated at 1,989.58. -EUS on 11/06/21 by Dr. Dulce Sellar, staged as T3 N3 (at least stage III) -staging CT CAP 11/08/21 showed: stable gastric antrum wall thickening; increased size of upper abdominal lymph nodes; stable left adrenal nodule, 1.9 cm. -PET scan showed FDG avid lymph nodes within the gastrohepatic ligament, portacaval region, and mesentery. Tracer avid left supraclavicular lymph node and indeterminate left adrenal gland nodule with mild FDG uptake. -he underwent [place] node biopsy on 1/17 which was negative for malignant cells, but no lymphoid tissue seen on biopsy  -repeated PET on 02/07/22 after 3 months neoadjuvant chemo showed resolved left supraclavicular lymph node, and significant improvement in regional lymph nodes and the primary tumor, stable and indeterminate left adrenal nodule.  -CT adrenal protocol showed the left adrenal nodule is likely benign adenoma, no biopsy is needed. -Completed neoadjvant chemo FLOT s/p 12 cycles 11/22/21 - 04/27/2022 -restaging PET on 05/16/22 showed significantly hypermetabolic activity at the primary gastric cancer in pylorus, and a small hypermetabolic mesenteric lymph node, no other evidence of metastasis. surgeon Dr. Sheliah Hatch aware of PET findings -S/p distal gastrectomy 05/20/22, path showed ypT3N3a with clear margins, 7/18 + LNs, and lymphovascular invasion identified. There was some treatment effect in a few lymph nodes nut not in the primary tumor -due to rising tumor marker, he underwent PET scan on July 07, 2022, which unfortunately showed hypermetabolic new adenopathy in right hilar and mesentery, diffuse liver metastasis, consistent with metastatic  recurrence.  I personally reviewed the PET scan images with patient -Unfortunately his disease is not curable at this stage. -The goal of chemotherapy is palliative, to prolong his life -I discussed his next generation sequencing foundation one result, which showed EGFR amplification, no other targetable mutations.  His tumor was previously tested for HER2 which was negative (0-1+).  PD-L1 CPS score was 2%, no significant benefit of immunotherapy.  -He started paclitaxel and ramucirumab on 07/25/2022  He developed worsening neuropathy quickly, and I had to switch his treatment to FOLFIRI on 08/29/2022, every 2 weeks, he is overall tolerating well. -restaging CT 01/19/2023 showed disease progression with new pathologic right paratracheal mediastinal lymph nodes as well as increasing size of several liver metastases. -I changed his chemo to taxol and ramucizumab on 02/05/2023, every 2 weeks per his request  -restaging CT 04/08/2023 showed partial response in liver and mediastinal nodes

## 2023-04-30 ENCOUNTER — Encounter: Payer: Self-pay | Admitting: Hematology

## 2023-04-30 ENCOUNTER — Inpatient Hospital Stay: Payer: BC Managed Care – PPO

## 2023-04-30 ENCOUNTER — Inpatient Hospital Stay (HOSPITAL_BASED_OUTPATIENT_CLINIC_OR_DEPARTMENT_OTHER): Payer: BC Managed Care – PPO | Admitting: Hematology

## 2023-04-30 VITALS — BP 138/88 | HR 83 | Temp 98.4°F | Resp 17 | Wt 172.1 lb

## 2023-04-30 DIAGNOSIS — Z5112 Encounter for antineoplastic immunotherapy: Secondary | ICD-10-CM | POA: Diagnosis not present

## 2023-04-30 DIAGNOSIS — C163 Malignant neoplasm of pyloric antrum: Secondary | ICD-10-CM

## 2023-04-30 DIAGNOSIS — Z95828 Presence of other vascular implants and grafts: Secondary | ICD-10-CM

## 2023-04-30 LAB — CBC WITH DIFFERENTIAL (CANCER CENTER ONLY)
Abs Immature Granulocytes: 0.01 10*3/uL (ref 0.00–0.07)
Basophils Absolute: 0 10*3/uL (ref 0.0–0.1)
Basophils Relative: 1 %
Eosinophils Absolute: 0.1 10*3/uL (ref 0.0–0.5)
Eosinophils Relative: 4 %
HCT: 28 % — ABNORMAL LOW (ref 39.0–52.0)
Hemoglobin: 8.9 g/dL — ABNORMAL LOW (ref 13.0–17.0)
Immature Granulocytes: 0 %
Lymphocytes Relative: 28 %
Lymphs Abs: 1 10*3/uL (ref 0.7–4.0)
MCH: 25.5 pg — ABNORMAL LOW (ref 26.0–34.0)
MCHC: 31.8 g/dL (ref 30.0–36.0)
MCV: 80.2 fL (ref 80.0–100.0)
Monocytes Absolute: 0.5 10*3/uL (ref 0.1–1.0)
Monocytes Relative: 16 %
Neutro Abs: 1.8 10*3/uL (ref 1.7–7.7)
Neutrophils Relative %: 51 %
Platelet Count: 205 10*3/uL (ref 150–400)
RBC: 3.49 MIL/uL — ABNORMAL LOW (ref 4.22–5.81)
RDW: 19.4 % — ABNORMAL HIGH (ref 11.5–15.5)
WBC Count: 3.4 10*3/uL — ABNORMAL LOW (ref 4.0–10.5)
nRBC: 0 % (ref 0.0–0.2)

## 2023-04-30 LAB — CMP (CANCER CENTER ONLY)
ALT: 12 U/L (ref 0–44)
AST: 27 U/L (ref 15–41)
Albumin: 4 g/dL (ref 3.5–5.0)
Alkaline Phosphatase: 79 U/L (ref 38–126)
Anion gap: 6 (ref 5–15)
BUN: 12 mg/dL (ref 6–20)
CO2: 26 mmol/L (ref 22–32)
Calcium: 8.9 mg/dL (ref 8.9–10.3)
Chloride: 108 mmol/L (ref 98–111)
Creatinine: 0.77 mg/dL (ref 0.61–1.24)
GFR, Estimated: >60 mL/min (ref >60)
Glucose, Bld: 123 mg/dL — ABNORMAL HIGH (ref 70–99)
Potassium: 4.5 mmol/L (ref 3.5–5.1)
Sodium: 140 mmol/L (ref 135–145)
Total Bilirubin: 0.5 mg/dL (ref 0.0–1.2)
Total Protein: 7.2 g/dL (ref 6.5–8.1)

## 2023-04-30 LAB — CEA (ACCESS): CEA (CHCC): 98.09 ng/mL — ABNORMAL HIGH (ref 0.00–5.00)

## 2023-04-30 LAB — TOTAL PROTEIN, URINE DIPSTICK: Protein, ur: NEGATIVE mg/dL

## 2023-04-30 MED ORDER — SODIUM CHLORIDE 0.9 % IV SOLN: INTRAVENOUS | Status: DC

## 2023-04-30 MED ORDER — FAMOTIDINE IN NACL 20-0.9 MG/50ML-% IV SOLN
20.0000 mg | Freq: Once | INTRAVENOUS | Status: AC
  Administered 2023-04-30: 20 mg via INTRAVENOUS
  Filled 2023-04-30: qty 50

## 2023-04-30 MED ORDER — HEPARIN SOD (PORK) LOCK FLUSH 100 UNIT/ML IV SOLN
500.0000 [IU] | Freq: Once | INTRAVENOUS | Status: AC | PRN
Start: 2023-04-30 — End: 2023-04-30
  Administered 2023-04-30: 500 [IU]

## 2023-04-30 MED ORDER — SODIUM CHLORIDE 0.9 % IV SOLN
100.0000 mg/m2 | Freq: Once | INTRAVENOUS | Status: AC
Start: 2023-04-30 — End: 2023-04-30
  Administered 2023-04-30: 198 mg via INTRAVENOUS
  Filled 2023-04-30: qty 33

## 2023-04-30 MED ORDER — SODIUM CHLORIDE 0.9% FLUSH
10.0000 mL | Freq: Once | INTRAVENOUS | Status: AC
  Administered 2023-04-30: 10 mL

## 2023-04-30 MED ORDER — SODIUM CHLORIDE 0.9 % IV SOLN
8.0000 mg/kg | Freq: Once | INTRAVENOUS | Status: AC
  Administered 2023-04-30: 600 mg via INTRAVENOUS
  Filled 2023-04-30: qty 10

## 2023-04-30 MED ORDER — ACETAMINOPHEN 325 MG PO TABS
650.0000 mg | ORAL_TABLET | Freq: Once | ORAL | Status: AC
  Administered 2023-04-30: 650 mg via ORAL
  Filled 2023-04-30: qty 2

## 2023-04-30 MED ORDER — DIPHENHYDRAMINE HCL 50 MG/ML IJ SOLN
25.0000 mg | Freq: Once | INTRAMUSCULAR | Status: AC
  Administered 2023-04-30: 25 mg via INTRAVENOUS
  Filled 2023-04-30: qty 1

## 2023-04-30 MED ORDER — SODIUM CHLORIDE 0.9 % IV SOLN
240.0000 mg | Freq: Once | INTRAVENOUS | Status: AC
  Administered 2023-04-30: 240 mg via INTRAVENOUS
  Filled 2023-04-30: qty 24

## 2023-04-30 MED ORDER — SODIUM CHLORIDE 0.9% FLUSH
10.0000 mL | INTRAVENOUS | Status: DC | PRN
  Administered 2023-04-30: 10 mL

## 2023-04-30 NOTE — Progress Notes (Signed)
 [facility]   Telephone:([phone]) [phone] Fax:(336) [phone]   Clinic Follow up Note   Patient Care Team: Pcp, No as PCP - Drinda Gentry, MD as Consulting Physician (Hematology and Oncology)  Date of Service:  04/30/2023  CHIEF COMPLAINT: f/u of gastric cancer  CURRENT THERAPY:  Second line chemotherapy paclitaxel  and ramucirumab  every 2 weeks  Oncology History   Gastric cancer (HCC)  cT3N3Mx, with hypermetabolic left supraclavicular node, and indeterminate adrenal nodule, MMR normal ypT3ypN3a, liver and nodes recurrence in 07/2022 -diagnosed 10/18/21 by EGD for 6 month f/u of gastric ulcers, showed mucosal intramucosal adenocarcinoma. MMR normal. -baseline CEA 11/01/21 significantly elevated at 1,989.58. -EUS on 11/06/21 by Dr. Kimble Pennant, staged as T3 N3 (at least stage III) -staging CT CAP 11/08/21 showed: stable gastric antrum wall thickening; increased size of upper abdominal lymph nodes; stable left adrenal nodule, 1.9 cm. -PET scan showed FDG avid lymph nodes within the gastrohepatic ligament, portacaval region, and mesentery. Tracer avid left supraclavicular lymph node and indeterminate left adrenal gland nodule with mild FDG uptake. -he underwent Connell node biopsy on 1/17 which was negative for malignant cells, but no lymphoid tissue seen on biopsy  -repeated PET on 02/07/22 after 3 months neoadjuvant chemo showed resolved left supraclavicular lymph node, and significant improvement in regional lymph nodes and the primary tumor, stable and indeterminate left adrenal nodule.  -CT adrenal protocol showed the left adrenal nodule is likely benign adenoma, no biopsy is needed. -Completed neoadjvant chemo FLOT s/p 12 cycles 11/22/21 - 04/27/2022 -restaging PET on 05/16/22 showed significantly hypermetabolic activity at the primary gastric cancer in pylorus, and a small hypermetabolic mesenteric lymph node, no other evidence of metastasis. surgeon Dr. Dorrie Gaudier aware of PET  findings -S/p distal gastrectomy 05/20/22, path showed ypT3N3a with clear margins, 7/18 + LNs, and lymphovascular invasion identified. There was some treatment effect in a few lymph nodes nut not in the primary tumor -due to rising tumor marker, he underwent PET scan on July 07, 2022, which unfortunately showed hypermetabolic new adenopathy in right hilar and mesentery, diffuse liver metastasis, consistent with metastatic recurrence.  I personally reviewed the PET scan images with patient -Unfortunately his disease is not curable at this stage. -The goal of chemotherapy is palliative, to prolong his life -I discussed his next generation sequencing foundation one result, which showed EGFR amplification, no other targetable mutations.  His tumor was previously tested for HER2 which was negative (0-1+).  PD-L1 CPS score was 2%, no significant benefit of immunotherapy.  -He started paclitaxel  and ramucirumab  on 07/25/2022  He developed worsening neuropathy quickly, and I had to switch his treatment to FOLFIRI on 08/29/2022, every 2 weeks, he is overall tolerating well. -restaging CT 01/19/2023 showed disease progression with new pathologic right paratracheal mediastinal lymph nodes as well as increasing size of several liver metastases. -I changed his chemo to taxol  and ramucizumab on 02/05/2023, every 2 weeks per his request  -restaging CT 04/08/2023 showed partial response in liver and mediastinal nodes    Assessment & Plan Metastatic gastric cancer Metastatic gastric cancer with a favorable response to treatment, indicated by a decrease in tumor marker levels from 600 to 200 to 100. He tolerates the current regimen well, with neuropathy as the only significant side effect. - Continue current treatment regimen as long as it is tolerated and effective - Monitor tumor marker levels regularly to assess response - Schedule next treatments on May 8th, May 22nd, and June 5th  Chemotherapy-induced  anemia Chemotherapy-induced anemia  with a hemoglobin level of 8.9, lower than previous levels but not requiring transfusion. Monitor for fatigue and exertional dyspnea. 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 Symptoms managed with ice, acetaminophen , and gabapentin . - Continue gabapentin  and acetaminophen  for neuropathy management - Encourage use of ice for symptom relief - Advise regular physical activity  Allergic rhinitis Intermittent symptoms of watery eyes and rhinorrhea, likely due to seasonal allergies. Symptoms manageable with OTC medications. - Recommend loratadine  for allergy symptoms - Suggest diphenhydramine  at night for additional relief and to aid sleep   Plan -Lab reviewed, adequate for treatment, will proceed to chemo today - Lab, follow-up and chemo in 2 weeks  SUMMARY OF ONCOLOGIC HISTORY: Oncology History Overview Note   Cancer Staging  Gastric cancer [facility]) Staging form: Stomach, AJCC 8th Edition - Clinical stage from 10/18/2021: Stage III (cT3, cN3a, cM0) - Signed by Sonja [place]on, MD on 11/10/2021     Gastric cancer (HCC)  10/18/2021 Procedure   EGD performed under the care of Dr. Honey Lusty  Findings:  -A large, ulcerated, partially circumferential (involving two thirds of the lumen circumference) mass with no bleeding and stigmata of recent bleeding was found in the gastric antrum, at the pylorus and in the prepyloric region of the stomach. -Segmental severe inflammation characterized by congestion (edema) and erythema was found in the gastric antrum.   10/18/2021 Cancer Staging   Staging form: Stomach, AJCC 8th Edition - Clinical stage from 10/18/2021: Stage III (cT3, cN3a, cM0) - Signed by Sonja [place], MD on 11/10/2021 Stage prefix: Initial diagnosis Histologic grade (G): GX Histologic grading system: 3 grade  system   10/29/2021 Pathology Results   Stomach, antrum, pylorus, biopsy: -AT LEAST MUCOSA INTRAMUCOSAL ADENOCARCINOMA ARISING WITHIN CHRONIC INACTIVE GASTRITIS WITH INTESTINAL METAPLASIA (INCOMPLETE TYPE). Negative for dysplasia.  Negative for helicobacter pylori  Mismatch Repair (MMR) Protein Imunohistochemistry (IHC): IHC Expression Result: MLH1: Preserved nuclear expression. MSH2: Preserved nuclear expression. MSH6: Preserved nuclear expression. PMS2: Preserved nuclear expression. Interpretation: NORMAL   10/31/2021 Initial Diagnosis   Gastric cancer (HCC)   11/01/2021 Tumor Marker   CEA = 1,610.96 (^)   11/06/2021 Procedure   Upper EUS, Dr. Kimble Pennant  Impression: - Normal esophagus. - A small amount of food (residue) in the stomach. - Congested, friable (with contact bleeding) and ulcerated mucosa in the prepyloric region of the stomach. - Normal examined duodenum. - There was no evidence of significant pathology in the left lobe of the liver. - Many abnormal lymph nodes (over 10) were visualized in the gastrohepatic ligament (level 18), celiac region (level 20), perigastric region, peripancreatic region and aortocaval region. - Wall thickening was seen in the prepyloric region of the stomach. The thickening appeared to be primarily within the deep mucosa (Layer 2) but extended through the muscularis propria. - Overall constellation of findings consistent with at least T3 N3 Mx (at least stage III) gastric adenocarcinoma.   11/08/2021 Imaging   EXAM: CT CHEST, ABDOMEN, AND PELVIS WITH CONTRAST  IMPRESSION: 1. Similar irregular wall thickening of the gastric antrum, consistent with patient's known primary gastric neoplasm. 2. Increased size of the upper abdominal lymph nodes, concerning for worsening nodal disease involvement. 3. Stable left adrenal nodule common nonspecific possibly a metastatic lesion or adenoma. 4. No evidence of metastatic disease in the chest. 5.  Left-sided colonic diverticulosis without findings of acute diverticulitis. 6. Enlarged prostate gland. 7.  Aortic Atherosclerosis (ICD10-I70.0).  11/22/2021 - 04/26/2022 Chemotherapy   Patient is on Treatment Plan : GASTROESOPHAGEAL FLOT q14d X 4 cycles     11/27/2021 Imaging    IMPRESSION: 1. The mass within the gastric antrum is mildly decreased in size when compared with 11/08/2021. There is persistent FDG uptake associated with this mass compatible with residual tumor. 2. Persistent FDG avid lymph nodes within the gastrohepatic ligament, portacaval region, and mesentery. These are mildly decreased in size when compared with 11/08/2021. 3. Tracer avid left supraclavicular lymph node is also mildly decreased in size when compared with 11/08/2021. 4. Indeterminate left adrenal gland nodule with mild FDG uptake. This may represent a lipid poor adenoma. Metastatic disease not exclude. 5. Diffuse increased uptake throughout the bone marrow is favored to represent treatment related change. 6.  Aortic Atherosclerosis (ICD10-I70.0).     02/07/2022 Imaging    IMPRESSION: 1. No substantial change in hypermetabolism associated with the distal gastric lesion. Although difficult to discern on noncontrast CT imaging, the soft tissue component appears to be decreased since the prior PET-CT. 2. Interval resolution of the hypermetabolic left supraclavicular lymph node. 3. Interval marked decrease of the hypermetabolic gastrohepatic ligament lymphadenopathy. Similar decrease in size and hypermetabolism associated with index nodes identified previously in the 4. porta hepatis and hypogastric region. 5. Stable left adrenal nodule with slight hypermetabolism. Nonspecific finding could represent lipid poor adenoma or metastatic deposit. 6. No new suspicious hypermetabolic disease on today's study. 7.  Aortic Atherosclerosis (ICD10-I70.0).   07/25/2022 - 08/15/2022 Chemotherapy   Patient is  on Treatment Plan : GASTROESOPHAGEAL Ramucirumab  D1, 15 + Paclitaxel  D1,8,15 q28d     08/29/2022 - 01/24/2023 Chemotherapy   Patient is on Treatment Plan : COLORECTAL FOLFIRI q14d     02/05/2023 -  Chemotherapy   Patient is on Treatment Plan : GASTROESOPHAGEAL Ramucirumab  D1, 15 + Paclitaxel  D1,8,15 q28d        Discussed the use of AI scribe software for clinical note transcription with the patient, who gave verbal consent to proceed.  History of Present Illness The patient, a 53 year old with metastatic gastric cancer, presents for a routine follow-up. He reports no new symptoms or changes in his condition. He has been experiencing watery eyes and a runny nose, which he attributes to seasonal allergies. He has been managing these symptoms with Claritin  and occasional Benadryl  at night.  The patient's neuropathy has shown slight improvement, but is still present. He has been managing the discomfort with ice, Tylenol , and gabapentin . Despite these symptoms, he has not missed any work. He reports that his energy levels have improved slightly, which he attributes to increased water  intake. His weight has remained stable, with only minor fluctuations.  The patient also mentions a recent lab result showing a hemoglobin level of 8.9, which is slightly lower than previous readings. He understands this is a side effect of his chemotherapy treatment. He has not experienced any fever, chills, or other signs of infection. His runny nose has clear discharge, and he has not noticed any thick yellow or dark drainage that could indicate a sinus infection.     All other systems were reviewed with the patient and are negative.  MEDICAL HISTORY:  Past Medical History:  Diagnosis Date   Acute upper GI bleed 04/12/2021   Anemia    Cancer (HCC)    stomach   Class 1 obesity 04/12/2021   Diverticulosis 04/12/2021   GERD (gastroesophageal reflux disease)    Hepatic steatosis 04/12/2021   Neuromuscular  disorder (HCC)  neuropathy from chemo    SURGICAL HISTORY: Past Surgical History:  Procedure Laterality Date   BIOPSY  04/12/2021   Procedure: BIOPSY;  Surgeon: Baldo Bonds, MD;  Location: [facility];  Service: Gastroenterology;;   ESOPHAGOGASTRODUODENOSCOPY N/A 04/12/2021   Procedure: ESOPHAGOGASTRODUODENOSCOPY (EGD);  Surgeon: Baldo Bonds, MD;  Location: [facility];  Service: Gastroenterology;  Laterality: N/A;   ESOPHAGOGASTRODUODENOSCOPY N/A 11/06/2021   Procedure: ESOPHAGOGASTRODUODENOSCOPY (EGD);  Surgeon: Evangeline Hilts, MD;  Location: [facility];  Service: Gastroenterology;  Laterality: N/A;   GASTRECTOMY N/A 05/20/2022   Procedure: OPEN PARTIAL GASTRECTOMY WITH GASTROJEJUNAL ANASTOMOSIS;  Surgeon: Dorrie Gaudier, Alphonso Aschoff, MD;  Location: [facility];  Service: General;  Laterality: N/A;   HERNIA REPAIR Left    inguinal   IR IMAGING GUIDED PORT INSERTION  11/15/2021   LAPAROSCOPY N/A 05/20/2022   Procedure: LAPAROSCOPY DIAGNOSTIC;  Surgeon: Derral Flick, MD;  Location: [facility];  Service: General;  Laterality: N/A;   UPPER ESOPHAGEAL ENDOSCOPIC ULTRASOUND (EUS) Bilateral 11/06/2021   Procedure: UPPER ESOPHAGEAL ENDOSCOPIC ULTRASOUND (EUS);  Surgeon: Evangeline Hilts, MD;  Location: [facility];  Service: Gastroenterology;  Laterality: Bilateral;    I have reviewed the social history and family history with the patient and they are unchanged from previous note.  ALLERGIES:  has no known allergies.  MEDICATIONS:  Current Outpatient Medications  Medication Sig Dispense Refill   acetaminophen  (TYLENOL ) 500 MG tablet Take 1 tablet (500 mg total) by mouth 2 (two) times daily. 60 tablet 3   gabapentin  (NEURONTIN ) 300 MG capsule Take 1 capsule (300 mg total) by mouth 3 (three) times daily. 90 capsule 2   magic mouthwash (nystatin , lidocaine , diphenhydrAMINE , alum & mag hydroxide) suspension Swish for 5 seconds and swallow 5 mLs by mouth 3 (three) times daily as  needed for mouth pain. 140 mL 0   ondansetron  (ZOFRAN -ODT) 4 MG disintegrating tablet Dissolve 1 tablet (4 mg total) by mouth every 6 (six) hours as needed for nausea or vomiting. 30 tablet 1   pantoprazole  (PROTONIX ) 40 MG tablet Take 1 tablet (40 mg) by mouth 2 times daily. 60 tablet 6   prochlorperazine  (COMPAZINE ) 10 MG tablet Take 1 tablet (10 mg total) by mouth every 6 (six) hours as needed for nausea or vomiting. 30 tablet 2   No current facility-administered medications for this visit.   Facility-Administered Medications Ordered in Other Visits  Medication Dose Route Frequency Provider Last Rate Last Admin   0.9 %  sodium chloride  infusion   Intravenous Continuous Sonja [place], MD 10 mL/hr at 04/30/23 0923 New Bag at 04/30/23 0923   heparin  lock flush 100 unit/mL  500 Units Intracatheter Once PRN Sonja [place], MD       nivolumab  (OPDIVO ) 240 mg in sodium chloride  0.9 % 100 mL chemo infusion  240 mg Intravenous Once Sonja [place], MD 248 mL/hr at 04/30/23 1012 240 mg at 04/30/23 1012   PACLitaxel  (TAXOL ) 198 mg in sodium chloride  0.9 % 250 mL chemo infusion (> 80mg /m2)  100 mg/m2 (Treatment Plan Recorded) Intravenous Once Sonja [place], MD       ramucirumab  (CYRAMZA ) 600 mg in sodium chloride  0.9 % 190 mL chemo infusion  8 mg/kg (Treatment Plan Adjusted) Intravenous Once Sonja [place], MD       sodium chloride  flush (NS) 0.9 % injection 10 mL  10 mL Intracatheter PRN Sonja [employer], MD        PHYSICAL EXAMINATION: ECOG PERFORMANCE STATUS: 1 - Symptomatic but completely ambulatory  Vitals:   04/30/23 0834  BP:  138/88  Pulse: 83  Resp: 17  Temp: 98.4 F (36.9 C)  SpO2: 99%   Wt Readings from Last 3 Encounters:  04/30/23 172 lb 1.6 oz (78.1 kg)  04/16/23 174 lb 9.6 oz (79.2 kg)  04/02/23 175 lb (79.4 kg)     GENERAL:alert, no distress and comfortable SKIN: skin color, texture, turgor are normal, no rashes or significant lesions EYES: normal, Conjunctiva are pink and non-injected, sclera  clear NECK: supple, thyroid  normal size, non-tender, without nodularity LYMPH:  no palpable lymphadenopathy in the cervical, axillary  LUNGS: clear to auscultation and percussion with normal breathing effort HEART: regular rate & rhythm and no murmurs and no lower extremity edema ABDOMEN:abdomen soft, non-tender and normal bowel sounds Musculoskeletal:no cyanosis of digits and no clubbing  NEURO: alert & oriented x 3 with fluent speech, no focal motor/sensory deficits  Physical Exam    LABORATORY DATA:  I have reviewed the data as listed    Latest Ref Rng & Units 04/30/2023    7:38 AM 04/16/2023    7:42 AM 04/02/2023    7:37 AM  CBC  WBC 4.0 - 10.5 K/uL 3.4  4.0  3.8   Hemoglobin 13.0 - 17.0 g/dL 8.9  9.5  9.5   Hematocrit 39.0 - 52.0 % 28.0  29.7  30.1   Platelets 150 - 400 K/uL 205  240  267         Latest Ref Rng & Units 04/30/2023    7:38 AM 04/16/2023    7:42 AM 04/02/2023    7:37 AM  CMP  Glucose 70 - 99 mg/dL 161  096  96   BUN 6 - 20 mg/dL 12  8  11    Creatinine 0.61 - 1.24 mg/dL 0.45  4.09  8.11   Sodium 135 - 145 mmol/L 140  141  140   Potassium 3.5 - 5.1 mmol/L 4.5  4.2  4.3   Chloride 98 - 111 mmol/L 108  110  107   CO2 22 - 32 mmol/L 26  26  27    Calcium  8.9 - 10.3 mg/dL 8.9  9.0  9.2   Total Protein 6.5 - 8.1 g/dL 7.2  7.4  7.5   Total Bilirubin 0.0 - 1.2 mg/dL 0.5  0.4  0.3   Alkaline Phos 38 - 126 U/L 79  80  86   AST 15 - 41 U/L 27  20  18    ALT 0 - 44 U/L 12  10  9        RADIOGRAPHIC STUDIES: I have personally reviewed the radiological images as listed and agreed with the findings in the report. No results found.    Orders Placed This Encounter  Procedures   Total Protein, Urine dipstick    Standing Status:   Standing    Number of Occurrences:   20    Next Expected Occurrence:   05/22/2023    Expiration Date:   04/29/2024   All questions were answered. The patient knows to call the clinic with any problems, questions or concerns. No barriers  to learning was detected. The total time spent in the appointment was 25 minutes.     Sonja , MD 04/30/2023

## 2023-04-30 NOTE — Progress Notes (Signed)
 Ok to adjust cyramza  dose to 600mg  d/t wt loss per Dr Maryalice Smaller

## 2023-04-30 NOTE — Patient Instructions (Signed)
 [facility] - A DEPT OF [facility]  Discharge Instructions: Thank you for choosing [facility] to provide your oncology and hematology care.   If you have a lab appointment with the Cancer Center, please go directly to the Cancer Center and check in at the registration area.   Wear comfortable clothing and clothing appropriate for easy access to any Portacath or PICC line.   We strive to give you quality time with your provider. You may need to reschedule your appointment if you arrive late (15 or more minutes).  Arriving late affects you and other patients whose appointments are after yours.  Also, if you miss three or more appointments without notifying the office, you may be dismissed from the clinic at the provider's discretion.      For prescription refill requests, have your pharmacy contact our office and allow 72 hours for refills to be completed.    Today you received the following chemotherapy and/or immunotherapy agents: ramucirumab, nivolumab, paclitaxel       To help prevent nausea and vomiting after your treatment, we encourage you to take your nausea medication as directed.  BELOW ARE SYMPTOMS THAT SHOULD BE REPORTED IMMEDIATELY: *FEVER GREATER THAN 100.4 F (38 C) OR HIGHER *CHILLS OR SWEATING *NAUSEA AND VOMITING THAT IS NOT CONTROLLED WITH YOUR NAUSEA MEDICATION *UNUSUAL SHORTNESS OF BREATH *UNUSUAL BRUISING OR BLEEDING *URINARY PROBLEMS (pain or burning when urinating, or frequent urination) *BOWEL PROBLEMS (unusual diarrhea, constipation, pain near the anus) TENDERNESS IN MOUTH AND THROAT WITH OR WITHOUT PRESENCE OF ULCERS (sore throat, sores in mouth, or a toothache) UNUSUAL RASH, SWELLING OR PAIN  UNUSUAL VAGINAL DISCHARGE OR ITCHING   Items with * indicate a potential emergency and should be followed up as soon as possible or go to the Emergency Department if any problems should occur.  Please show the CHEMOTHERAPY  ALERT CARD or IMMUNOTHERAPY ALERT CARD at check-in to the Emergency Department and triage nurse.  Should you have questions after your visit or need to cancel or reschedule your appointment, please contact [facility] - A DEPT OF [facility]  Dept: [phone]  and follow the prompts.  Office hours are 8:00 a.m. to 4:30 p.m. Monday - Friday. Please note that voicemails left after 4:00 p.m. may not be returned until the following business day.  We are closed weekends and major holidays. You have access to a nurse at all times for urgent questions. Please call the main number to the clinic Dept: [phone] and follow the prompts.   For any non-urgent questions, you may also contact your provider using MyChart. We now offer e-Visits for anyone 88 and older to request care online for non-urgent symptoms. For details visit [email or web address].   Also download the MyChart app! Go to the app store, search "MyChart", open the app, select [facility], and log in with your MyChart username and password.

## 2023-05-13 NOTE — Assessment & Plan Note (Signed)
  cT3N3Mx, with hypermetabolic left supraclavicular node, and indeterminate adrenal nodule, MMR normal ypT3ypN3a, liver and nodes recurrence in 07/2022 -diagnosed 10/18/21 by EGD for 6 month f/u of gastric ulcers, showed mucosal intramucosal adenocarcinoma. MMR normal. -baseline CEA 11/01/21 significantly elevated at 1,989.58. -EUS on 11/06/21 by Dr. Dulce Sellar, staged as T3 N3 (at least stage III) -staging CT CAP 11/08/21 showed: stable gastric antrum wall thickening; increased size of upper abdominal lymph nodes; stable left adrenal nodule, 1.9 cm. -PET scan showed FDG avid lymph nodes within the gastrohepatic ligament, portacaval region, and mesentery. Tracer avid left supraclavicular lymph node and indeterminate left adrenal gland nodule with mild FDG uptake. -he underwent [place] node biopsy on 1/17 which was negative for malignant cells, but no lymphoid tissue seen on biopsy  -repeated PET on 02/07/22 after 3 months neoadjuvant chemo showed resolved left supraclavicular lymph node, and significant improvement in regional lymph nodes and the primary tumor, stable and indeterminate left adrenal nodule.  -CT adrenal protocol showed the left adrenal nodule is likely benign adenoma, no biopsy is needed. -Completed neoadjvant chemo FLOT s/p 12 cycles 11/22/21 - 04/27/2022 -restaging PET on 05/16/22 showed significantly hypermetabolic activity at the primary gastric cancer in pylorus, and a small hypermetabolic mesenteric lymph node, no other evidence of metastasis. surgeon Dr. Sheliah Hatch aware of PET findings -S/p distal gastrectomy 05/20/22, path showed ypT3N3a with clear margins, 7/18 + LNs, and lymphovascular invasion identified. There was some treatment effect in a few lymph nodes nut not in the primary tumor -due to rising tumor marker, he underwent PET scan on July 07, 2022, which unfortunately showed hypermetabolic new adenopathy in right hilar and mesentery, diffuse liver metastasis, consistent with metastatic  recurrence.  I personally reviewed the PET scan images with patient -Unfortunately his disease is not curable at this stage. -The goal of chemotherapy is palliative, to prolong his life -I discussed his next generation sequencing foundation one result, which showed EGFR amplification, no other targetable mutations.  His tumor was previously tested for HER2 which was negative (0-1+).  PD-L1 CPS score was 2%, no significant benefit of immunotherapy.  -He started paclitaxel and ramucirumab on 07/25/2022  He developed worsening neuropathy quickly, and I had to switch his treatment to FOLFIRI on 08/29/2022, every 2 weeks, he is overall tolerating well. -restaging CT 01/19/2023 showed disease progression with new pathologic right paratracheal mediastinal lymph nodes as well as increasing size of several liver metastases. -I changed his chemo to taxol and ramucizumab on 02/05/2023, every 2 weeks per his request  -restaging CT 04/08/2023 showed partial response in liver and mediastinal nodes

## 2023-05-14 ENCOUNTER — Other Ambulatory Visit: Payer: Self-pay

## 2023-05-14 ENCOUNTER — Inpatient Hospital Stay: Payer: BC Managed Care – PPO

## 2023-05-14 ENCOUNTER — Inpatient Hospital Stay: Payer: BC Managed Care – PPO | Attending: Physician Assistant | Admitting: Hematology

## 2023-05-14 ENCOUNTER — Ambulatory Visit (HOSPITAL_COMMUNITY)
Admission: RE | Admit: 2023-05-14 | Discharge: 2023-05-14 | Disposition: A | Discharge status: Home / Self Care | Source: Ambulatory Visit | Attending: Hematology | Admitting: Hematology

## 2023-05-14 ENCOUNTER — Encounter: Payer: Self-pay | Admitting: Hematology

## 2023-05-14 VITALS — BP 138/80 | HR 72 | Temp 98.9°F | Resp 17 | Wt 174.5 lb

## 2023-05-14 DIAGNOSIS — T451X5D Adverse effect of antineoplastic and immunosuppressive drugs, subsequent encounter: Secondary | ICD-10-CM | POA: Insufficient documentation

## 2023-05-14 DIAGNOSIS — E278 Other specified disorders of adrenal gland: Secondary | ICD-10-CM | POA: Diagnosis not present

## 2023-05-14 DIAGNOSIS — Z7952 Long term (current) use of systemic steroids: Secondary | ICD-10-CM | POA: Diagnosis not present

## 2023-05-14 DIAGNOSIS — R5383 Other fatigue: Secondary | ICD-10-CM | POA: Insufficient documentation

## 2023-05-14 DIAGNOSIS — D500 Iron deficiency anemia secondary to blood loss (chronic): Secondary | ICD-10-CM

## 2023-05-14 DIAGNOSIS — Z5112 Encounter for antineoplastic immunotherapy: Secondary | ICD-10-CM | POA: Diagnosis present

## 2023-05-14 DIAGNOSIS — M25512 Pain in left shoulder: Secondary | ICD-10-CM | POA: Insufficient documentation

## 2023-05-14 DIAGNOSIS — D6481 Anemia due to antineoplastic chemotherapy: Secondary | ICD-10-CM | POA: Insufficient documentation

## 2023-05-14 DIAGNOSIS — C163 Malignant neoplasm of pyloric antrum: Secondary | ICD-10-CM | POA: Diagnosis not present

## 2023-05-14 DIAGNOSIS — G620 Drug-induced polyneuropathy: Secondary | ICD-10-CM | POA: Diagnosis not present

## 2023-05-14 DIAGNOSIS — Z95828 Presence of other vascular implants and grafts: Secondary | ICD-10-CM

## 2023-05-14 DIAGNOSIS — R0600 Dyspnea, unspecified: Secondary | ICD-10-CM | POA: Insufficient documentation

## 2023-05-14 DIAGNOSIS — Z79899 Other long term (current) drug therapy: Secondary | ICD-10-CM | POA: Insufficient documentation

## 2023-05-14 DIAGNOSIS — Z5111 Encounter for antineoplastic chemotherapy: Secondary | ICD-10-CM | POA: Diagnosis present

## 2023-05-14 LAB — CMP (CANCER CENTER ONLY)
ALT: 14 U/L (ref 0–44)
AST: 24 U/L (ref 15–41)
Albumin: 4 g/dL (ref 3.5–5.0)
Alkaline Phosphatase: 76 U/L (ref 38–126)
Anion gap: 5 (ref 5–15)
BUN: 8 mg/dL (ref 6–20)
CO2: 26 mmol/L (ref 22–32)
Calcium: 9 mg/dL (ref 8.9–10.3)
Chloride: 110 mmol/L (ref 98–111)
Creatinine: 0.82 mg/dL (ref 0.61–1.24)
GFR, Estimated: >60 mL/min (ref >60)
Glucose, Bld: 84 mg/dL (ref 70–99)
Potassium: 4.3 mmol/L (ref 3.5–5.1)
Sodium: 141 mmol/L (ref 135–145)
Total Bilirubin: 0.4 mg/dL (ref 0.0–1.2)
Total Protein: 7.1 g/dL (ref 6.5–8.1)

## 2023-05-14 LAB — CBC WITH DIFFERENTIAL (CANCER CENTER ONLY)
Abs Immature Granulocytes: 0.01 10*3/uL (ref 0.00–0.07)
Basophils Absolute: 0 10*3/uL (ref 0.0–0.1)
Basophils Relative: 1 %
Eosinophils Absolute: 0.1 10*3/uL (ref 0.0–0.5)
Eosinophils Relative: 3 %
HCT: 26.3 % — ABNORMAL LOW (ref 39.0–52.0)
Hemoglobin: 8.5 g/dL — ABNORMAL LOW (ref 13.0–17.0)
Immature Granulocytes: 0 %
Lymphocytes Relative: 30 %
Lymphs Abs: 1.1 10*3/uL (ref 0.7–4.0)
MCH: 25.4 pg — ABNORMAL LOW (ref 26.0–34.0)
MCHC: 32.3 g/dL (ref 30.0–36.0)
MCV: 78.5 fL — ABNORMAL LOW (ref 80.0–100.0)
Monocytes Absolute: 0.5 10*3/uL (ref 0.1–1.0)
Monocytes Relative: 13 %
Neutro Abs: 2 10*3/uL (ref 1.7–7.7)
Neutrophils Relative %: 53 %
Platelet Count: 196 10*3/uL (ref 150–400)
RBC: 3.35 MIL/uL — ABNORMAL LOW (ref 4.22–5.81)
RDW: 20.5 % — ABNORMAL HIGH (ref 11.5–15.5)
WBC Count: 3.7 10*3/uL — ABNORMAL LOW (ref 4.0–10.5)
nRBC: 0 % (ref 0.0–0.2)

## 2023-05-14 LAB — CEA (ACCESS): CEA (CHCC): 96.41 ng/mL — ABNORMAL HIGH (ref 0.00–5.00)

## 2023-05-14 LAB — TOTAL PROTEIN, URINE DIPSTICK: Protein, ur: NEGATIVE mg/dL

## 2023-05-14 LAB — FERRITIN: Ferritin: 48 ng/mL (ref 24–336)

## 2023-05-14 MED ORDER — SODIUM CHLORIDE 0.9 % IV SOLN
8.0000 mg/kg | Freq: Once | INTRAVENOUS | Status: AC
  Administered 2023-05-14: 600 mg via INTRAVENOUS
  Filled 2023-05-14: qty 10

## 2023-05-14 MED ORDER — GABAPENTIN 300 MG PO CAPS: 300.0000 mg | ORAL_CAPSULE | Freq: Three times a day (TID) | ORAL | 2 refills | Status: DC

## 2023-05-14 MED ORDER — FAMOTIDINE IN NACL 20-0.9 MG/50ML-% IV SOLN
20.0000 mg | Freq: Once | INTRAVENOUS | Status: AC
  Administered 2023-05-14: 20 mg via INTRAVENOUS
  Filled 2023-05-14: qty 50

## 2023-05-14 MED ORDER — ACETAMINOPHEN 325 MG PO TABS
650.0000 mg | ORAL_TABLET | Freq: Once | ORAL | Status: AC
  Administered 2023-05-14: 650 mg via ORAL
  Filled 2023-05-14: qty 2

## 2023-05-14 MED ORDER — HEPARIN SOD (PORK) LOCK FLUSH 100 UNIT/ML IV SOLN
500.0000 [IU] | Freq: Once | INTRAVENOUS | Status: AC | PRN
  Administered 2023-05-14: 500 [IU]

## 2023-05-14 MED ORDER — SODIUM CHLORIDE 0.9% FLUSH
10.0000 mL | INTRAVENOUS | Status: DC | PRN
  Administered 2023-05-14: 10 mL

## 2023-05-14 MED ORDER — SODIUM CHLORIDE 0.9 % IV SOLN
100.0000 mg/m2 | Freq: Once | INTRAVENOUS | Status: AC
  Administered 2023-05-14: 198 mg via INTRAVENOUS
  Filled 2023-05-14: qty 33

## 2023-05-14 MED ORDER — SODIUM CHLORIDE 0.9 % IV SOLN: INTRAVENOUS | Status: DC

## 2023-05-14 MED ORDER — DIPHENHYDRAMINE HCL 50 MG/ML IJ SOLN
25.0000 mg | Freq: Once | INTRAMUSCULAR | Status: AC
  Administered 2023-05-14: 25 mg via INTRAVENOUS
  Filled 2023-05-14: qty 1

## 2023-05-14 MED ORDER — SODIUM CHLORIDE 0.9% FLUSH
10.0000 mL | Freq: Once | INTRAVENOUS | Status: AC
  Administered 2023-05-14: 10 mL

## 2023-05-14 MED ORDER — SODIUM CHLORIDE 0.9 % IV SOLN
240.0000 mg | Freq: Once | INTRAVENOUS | Status: AC
  Administered 2023-05-14: 240 mg via INTRAVENOUS
  Filled 2023-05-14: qty 24

## 2023-05-14 NOTE — Progress Notes (Signed)
 Update Cyramza  dose based on current weight per Dr. Maryalice Smaller.  Zelma Mazariego, Pharm.D., CPP 05/14/2023@9 :47 AM

## 2023-05-14 NOTE — Patient Instructions (Signed)
 [facility] - A DEPT OF [facility]  Discharge Instructions: Thank you for choosing [facility] to provide your oncology and hematology care.   If you have a lab appointment with the Cancer Center, please go directly to the Cancer Center and check in at the registration area.   Wear comfortable clothing and clothing appropriate for easy access to any Portacath or PICC line.   We strive to give you quality time with your provider. You may need to reschedule your appointment if you arrive late (15 or more minutes).  Arriving late affects you and other patients whose appointments are after yours.  Also, if you miss three or more appointments without notifying the office, you may be dismissed from the clinic at the provider's discretion.      For prescription refill requests, have your pharmacy contact our office and allow 72 hours for refills to be completed.    Today you received the following chemotherapy and/or immunotherapy agents: ramucirumab, nivolumab, paclitaxel       To help prevent nausea and vomiting after your treatment, we encourage you to take your nausea medication as directed.  BELOW ARE SYMPTOMS THAT SHOULD BE REPORTED IMMEDIATELY: *FEVER GREATER THAN 100.4 F (38 C) OR HIGHER *CHILLS OR SWEATING *NAUSEA AND VOMITING THAT IS NOT CONTROLLED WITH YOUR NAUSEA MEDICATION *UNUSUAL SHORTNESS OF BREATH *UNUSUAL BRUISING OR BLEEDING *URINARY PROBLEMS (pain or burning when urinating, or frequent urination) *BOWEL PROBLEMS (unusual diarrhea, constipation, pain near the anus) TENDERNESS IN MOUTH AND THROAT WITH OR WITHOUT PRESENCE OF ULCERS (sore throat, sores in mouth, or a toothache) UNUSUAL RASH, SWELLING OR PAIN  UNUSUAL VAGINAL DISCHARGE OR ITCHING   Items with * indicate a potential emergency and should be followed up as soon as possible or go to the Emergency Department if any problems should occur.  Please show the CHEMOTHERAPY  ALERT CARD or IMMUNOTHERAPY ALERT CARD at check-in to the Emergency Department and triage nurse.  Should you have questions after your visit or need to cancel or reschedule your appointment, please contact [facility] - A DEPT OF [facility]  Dept: [phone]  and follow the prompts.  Office hours are 8:00 a.m. to 4:30 p.m. Monday - Friday. Please note that voicemails left after 4:00 p.m. may not be returned until the following business day.  We are closed weekends and major holidays. You have access to a nurse at all times for urgent questions. Please call the main number to the clinic Dept: [phone] and follow the prompts.   For any non-urgent questions, you may also contact your provider using MyChart. We now offer e-Visits for anyone 88 and older to request care online for non-urgent symptoms. For details visit [email or web address].   Also download the MyChart app! Go to the app store, search "MyChart", open the app, select [facility], and log in with your MyChart username and password.

## 2023-05-14 NOTE — Progress Notes (Signed)
 [facility]   Telephone:([phone]) [phone] Fax:(336) [phone]   Clinic Follow up Note   Patient Care Team: Pcp, No as PCP - Drinda Gentry, MD as Consulting Physician (Hematology and Oncology)  Date of Service:  05/14/2023  CHIEF COMPLAINT: f/u of gastric cancer   CURRENT THERAPY:  Second line chemotherapy paclitaxel  and ramucirumab  every 2 weeks    Oncology History   Gastric cancer (HCC)  cT3N3Mx, with hypermetabolic left supraclavicular node, and indeterminate adrenal nodule, MMR normal ypT3ypN3a, liver and nodes recurrence in 07/2022 -diagnosed 10/18/21 by EGD for 6 month f/u of gastric ulcers, showed mucosal intramucosal adenocarcinoma. MMR normal. -baseline CEA 11/01/21 significantly elevated at 1,989.58. -EUS on 11/06/21 by Dr. Kimble Pennant, staged as T3 N3 (at least stage III) -staging CT CAP 11/08/21 showed: stable gastric antrum wall thickening; increased size of upper abdominal lymph nodes; stable left adrenal nodule, 1.9 cm. -PET scan showed FDG avid lymph nodes within the gastrohepatic ligament, portacaval region, and mesentery. Tracer avid left supraclavicular lymph node and indeterminate left adrenal gland nodule with mild FDG uptake. -he underwent [place] node biopsy on 1/17 which was negative for malignant cells, but no lymphoid tissue seen on biopsy  -repeated PET on 02/07/22 after 3 months neoadjuvant chemo showed resolved left supraclavicular lymph node, and significant improvement in regional lymph nodes and the primary tumor, stable and indeterminate left adrenal nodule.  -CT adrenal protocol showed the left adrenal nodule is likely benign adenoma, no biopsy is needed. -Completed neoadjvant chemo FLOT s/p 12 cycles 11/22/21 - 04/27/2022 -restaging PET on 05/16/22 showed significantly hypermetabolic activity at the primary gastric cancer in pylorus, and a small hypermetabolic mesenteric lymph node, no other evidence of metastasis. surgeon Dr. Dorrie Gaudier aware of PET  findings -S/p distal gastrectomy 05/20/22, path showed ypT3N3a with clear margins, 7/18 + LNs, and lymphovascular invasion identified. There was some treatment effect in a few lymph nodes nut not in the primary tumor -due to rising tumor marker, he underwent PET scan on July 07, 2022, which unfortunately showed hypermetabolic new adenopathy in right hilar and mesentery, diffuse liver metastasis, consistent with metastatic recurrence.  I personally reviewed the PET scan images with patient -Unfortunately his disease is not curable at this stage. -The goal of chemotherapy is palliative, to prolong his life -I discussed his next generation sequencing foundation one result, which showed EGFR amplification, no other targetable mutations.  His tumor was previously tested for HER2 which was negative (0-1+).  PD-L1 CPS score was 2%, no significant benefit of immunotherapy.  -He started paclitaxel  and ramucirumab  on 07/25/2022  He developed worsening neuropathy quickly, and I had to switch his treatment to FOLFIRI on 08/29/2022, every 2 weeks, he is overall tolerating well. -restaging CT 01/19/2023 showed disease progression with new pathologic right paratracheal mediastinal lymph nodes as well as increasing size of several liver metastases. -I changed his chemo to taxol  and ramucizumab on 02/05/2023, every 2 weeks per his request  -restaging CT 04/08/2023 showed partial response in liver and mediastinal nodes   Assessment & Plan Metastatic gastric cancer Metastatic gastric cancer under ongoing management with chemotherapy. No new cancer-related symptoms. Appetite and energy levels are stable. No gastrointestinal bleeding or abdominal pain. Blood counts slightly low but adequate for treatment continuation. - Continue chemotherapy as scheduled - Monitor blood counts regularly - Advise to maintain activity but avoid overexertion - Educate on signs of worsening anemia and when to seek medical attention  Anemia due to  chemotherapy Anemia secondary to chemotherapy with  hemoglobin at 8.5 g/dL, decreased from 10 g/dL two months ago. Likely contributing to mild fatigue and dyspnea. No immediate need for transfusion unless hemoglobin drops below 8 g/dL. - Monitor hemoglobin levels closely - Advise to perform activities slowly and rest as needed  Peripheral neuropathy Peripheral neuropathy symptoms unchanged. Experiences occasional staggering, likely due to neuropathy. Gabapentin  is being used for management. - Refill gabapentin  prescription - Advise to be cautious to prevent falls, especially on stairs  Shoulder pain Acute left shoulder pain with tenderness and limited range of motion for three days. Likely musculoskeletal strain or tendonitis related to recent physical activity (car repair). No fever or chills. Pain alleviated with heating pad and ibuprofen. Advised to avoid NSAIDs if platelet count is low, but current platelet count is normal. - Order shoulder x-ray to evaluate bone structure - Advise use of heating pad and ibuprofen for pain management  Plan -lab reviewed, will proceed chemo today and continue every 2 weeks  -left shoulder x-ray today  -f/u in 2 weeks before chemo    SUMMARY OF ONCOLOGIC HISTORY: Oncology History Overview Note   Cancer Staging  Gastric cancer [facility]) Staging form: Stomach, AJCC 8th Edition - Clinical stage from 10/18/2021: Stage III (cT3, cN3a, cM0) - Signed by Sonja [place], MD on 11/10/2021     Gastric cancer (HCC)  10/18/2021 Procedure   EGD performed under the care of Dr. Honey Lusty  Findings:  -A large, ulcerated, partially circumferential (involving two thirds of the lumen circumference) mass with no bleeding and stigmata of recent bleeding was found in the gastric antrum, at the pylorus and in the prepyloric region of the stomach. -Segmental severe inflammation characterized by congestion (edema) and erythema was found in the gastric antrum.   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 Negative for dysplasia.  Negative for helicobacter pylori  Mismatch Repair (MMR) Protein Imunohistochemistry (IHC): IHC Expression Result: MLH1: Preserved nuclear expression. MSH2: Preserved nuclear expression. MSH6: Preserved nuclear expression. PMS2: Preserved nuclear expression. Interpretation: NORMAL   10/31/2021 Initial Diagnosis   Gastric cancer (HCC)   11/01/2021 Tumor Marker   CEA = 1,610.96 (^)   11/06/2021 Procedure   Upper EUS, Dr. Kimble Pennant  Impression: - Normal esophagus. - A small amount of food (residue) in the stomach. - Congested, friable (with contact bleeding) and ulcerated mucosa in the prepyloric region of the stomach. - Normal examined duodenum. - There was no evidence of significant pathology in the left lobe of the liver. - Many abnormal lymph nodes (over 10) were visualized in the gastrohepatic ligament (level 18), celiac region (level 20), perigastric region, peripancreatic region and aortocaval region. - Wall thickening was seen in the prepyloric region of the stomach. The thickening appeared to be primarily within the deep mucosa (Layer 2) but extended through the muscularis propria. - Overall constellation of findings consistent with at least T3 N3 Mx (at least stage III) gastric adenocarcinoma.   11/08/2021 Imaging   EXAM: CT CHEST, ABDOMEN, AND PELVIS WITH CONTRAST  IMPRESSION: 1. Similar irregular wall thickening of the gastric antrum, consistent with patient's known primary gastric neoplasm. 2. Increased  size of the upper abdominal  lymph nodes, concerning for worsening nodal disease involvement. 3. Stable left adrenal nodule common nonspecific possibly a metastatic lesion or adenoma. 4. No evidence of metastatic disease in the chest. 5. Left-sided colonic diverticulosis without findings of acute diverticulitis. 6. Enlarged prostate gland. 7.  Aortic Atherosclerosis (ICD10-I70.0).   11/22/2021 - 04/26/2022 Chemotherapy   Patient is on Treatment Plan : GASTROESOPHAGEAL FLOT q14d X 4 cycles     11/27/2021 Imaging    IMPRESSION: 1. The mass within the gastric antrum is mildly decreased in size when compared with 11/08/2021. There is persistent FDG uptake associated with this mass compatible with residual tumor. 2. Persistent FDG avid lymph nodes within the gastrohepatic ligament, portacaval region, and mesentery. These are mildly decreased in size when compared with 11/08/2021. 3. Tracer avid left supraclavicular lymph node is also mildly decreased in size when compared with 11/08/2021. 4. Indeterminate left adrenal gland nodule with mild FDG uptake. This may represent a lipid poor adenoma. Metastatic disease not exclude. 5. Diffuse increased uptake throughout the bone marrow is favored to represent treatment related change. 6.  Aortic Atherosclerosis (ICD10-I70.0).     02/07/2022 Imaging    IMPRESSION: 1. No substantial change in hypermetabolism associated with the distal gastric lesion. Although difficult to discern on noncontrast CT imaging, the soft tissue component appears to be decreased since the prior PET-CT. 2. Interval resolution of the hypermetabolic left supraclavicular lymph node. 3. Interval marked decrease of the hypermetabolic gastrohepatic ligament lymphadenopathy. Similar decrease in size and hypermetabolism associated with index nodes identified previously in the 4. porta hepatis and hypogastric region. 5. Stable left adrenal nodule with slight  hypermetabolism. Nonspecific finding could represent lipid poor adenoma or metastatic deposit. 6. No new suspicious hypermetabolic disease on today's study. 7.  Aortic Atherosclerosis (ICD10-I70.0).   07/25/2022 - 08/15/2022 Chemotherapy   Patient is on Treatment Plan : GASTROESOPHAGEAL Ramucirumab  D1, 15 + Paclitaxel  D1,8,15 q28d     08/29/2022 - 01/24/2023 Chemotherapy   Patient is on Treatment Plan : COLORECTAL FOLFIRI q14d     02/05/2023 -  Chemotherapy   Patient is on Treatment Plan : GASTROESOPHAGEAL Ramucirumab  D1, 15 + Paclitaxel  D1,8,15 q28d        Discussed the use of AI scribe software for clinical note transcription with the patient, who gave verbal consent to proceed.  History of Present Illness Jonathan Maynard "Larinda Plover" is a 53 year old male with metastatic gastric cancer who presents for follow-up.  He experiences throbbing, intermittent pain in the left shoulder blade for three days, radiating to the arm and limiting range of motion. A heating pad provides some relief. There is no recent heavy lifting or injury, and the pain does not disturb sleep.  He is concerned about iron  levels, recalling he was low previously. He is not taking iron  supplements and denies bleeding, melena, or hematochezia. Appetite is good, with intermittent energy levels. Mild dyspnea and fatigue are present, likely related to anemia.  Neuropathy is unchanged, causing occasional staggering. He is cautious to avoid falls and takes gabapentin  three times daily, requesting a refill. He has sufficient antiemetic medication for an upcoming trip to [place] .  He plans to travel to [place]  next week for his anniversary, a trip previously postponed due to chemotherapy. He typically drives long distances without issue. No fever, chills, abdominal pain, chest pain, or bleeding. Sleep is good, with no hand swelling.     All other systems were reviewed with the patient and are negative.  MEDICAL HISTORY:   Past  Medical History:  Diagnosis Date   Acute upper GI bleed 04/12/2021   Anemia    Cancer (HCC)    stomach   Class 1 obesity 04/12/2021   Diverticulosis 04/12/2021   GERD (gastroesophageal reflux disease)    Hepatic steatosis 04/12/2021   Neuromuscular disorder (HCC)    neuropathy from chemo    SURGICAL HISTORY: Past Surgical History:  Procedure Laterality Date   BIOPSY  04/12/2021   Procedure: BIOPSY;  Surgeon: Baldo Bonds, MD;  Location: [facility];  Service: Gastroenterology;;   ESOPHAGOGASTRODUODENOSCOPY N/A 04/12/2021   Procedure: ESOPHAGOGASTRODUODENOSCOPY (EGD);  Surgeon: Baldo Bonds, MD;  Location: [facility];  Service: Gastroenterology;  Laterality: N/A;   ESOPHAGOGASTRODUODENOSCOPY N/A 11/06/2021   Procedure: ESOPHAGOGASTRODUODENOSCOPY (EGD);  Surgeon: Evangeline Hilts, MD;  Location: [facility];  Service: Gastroenterology;  Laterality: N/A;   GASTRECTOMY N/A 05/20/2022   Procedure: OPEN PARTIAL GASTRECTOMY WITH GASTROJEJUNAL ANASTOMOSIS;  Surgeon: Dorrie Gaudier, Alphonso Aschoff, MD;  Location: [facility];  Service: General;  Laterality: N/A;   HERNIA REPAIR Left    inguinal   IR IMAGING GUIDED PORT INSERTION  11/15/2021   LAPAROSCOPY N/A 05/20/2022   Procedure: LAPAROSCOPY DIAGNOSTIC;  Surgeon: Derral Flick, MD;  Location: [facility];  Service: General;  Laterality: N/A;   UPPER ESOPHAGEAL ENDOSCOPIC ULTRASOUND (EUS) Bilateral 11/06/2021   Procedure: UPPER ESOPHAGEAL ENDOSCOPIC ULTRASOUND (EUS);  Surgeon: Evangeline Hilts, MD;  Location: [facility];  Service: Gastroenterology;  Laterality: Bilateral;    I have reviewed the social history and family history with the patient and they are unchanged from previous note.  ALLERGIES:  has no known allergies.  MEDICATIONS:  Current Outpatient Medications  Medication Sig Dispense Refill   acetaminophen  (TYLENOL ) 500 MG tablet Take 1 tablet (500 mg total) by mouth 2 (two) times daily. 60 tablet 3   gabapentin   (NEURONTIN ) 300 MG capsule Take 1 capsule (300 mg total) by mouth 3 (three) times daily. 90 capsule 2   magic mouthwash (nystatin , lidocaine , diphenhydrAMINE , alum & mag hydroxide) suspension Swish for 5 seconds and swallow 5 mLs by mouth 3 (three) times daily as needed for mouth pain. 140 mL 0   ondansetron  (ZOFRAN -ODT) 4 MG disintegrating tablet Dissolve 1 tablet (4 mg total) by mouth every 6 (six) hours as needed for nausea or vomiting. 30 tablet 1   pantoprazole  (PROTONIX ) 40 MG tablet Take 1 tablet (40 mg) by mouth 2 times daily. 60 tablet 6   prochlorperazine  (COMPAZINE ) 10 MG tablet Take 1 tablet (10 mg total) by mouth every 6 (six) hours as needed for nausea or vomiting. 30 tablet 2   No current facility-administered medications for this visit.   Facility-Administered Medications Ordered in Other Visits  Medication Dose Route Frequency Provider Last Rate Last Admin   0.9 %  sodium chloride  infusion   Intravenous Continuous Sonja [place], MD   Stopped at 05/14/23 1450   sodium chloride  flush (NS) 0.9 % injection 10 mL  10 mL Intracatheter PRN Sonja Cope, MD   10 mL at 05/14/23 1451    PHYSICAL EXAMINATION: ECOG PERFORMANCE STATUS: 1 - Symptomatic but completely ambulatory  Vitals:   05/14/23 0816  BP: 138/80  Pulse: 72  Resp: 17  Temp: 98.9 F (37.2 C)  SpO2: 99%   Wt Readings from Last 3 Encounters:  05/14/23 174 lb 8 oz (79.2 kg)  04/30/23 172 lb 1.6 oz (78.1 kg)  04/16/23 174 lb 9.6 oz (79.2 kg)     GENERAL:alert, no distress and comfortable SKIN: skin color, texture, turgor  are normal, no rashes or significant lesions EYES: normal, Conjunctiva are pink and non-injected, sclera clear NECK: supple, thyroid  normal size, non-tender, without nodularity LYMPH:  no palpable lymphadenopathy in the cervical, axillary  LUNGS: clear to auscultation and percussion with normal breathing effort HEART: regular rate & rhythm and no murmurs and no arm or lower extremity  edema ABDOMEN:abdomen soft, non-tender and normal bowel sounds Musculoskeletal:no cyanosis of digits and no clubbing, mild tenderness around left shoulder without edema or skin change  NEURO: alert & oriented x 3 with fluent speech, no focal motor/sensory deficits  Physical Exam MUSCULOSKELETAL: Shoulders appear swollen, tender on palpation, with limited range of motion.  LABORATORY DATA:  I have reviewed the data as listed    Latest Ref Rng & Units 05/14/2023    7:38 AM 04/30/2023    7:38 AM 04/16/2023    7:42 AM  CBC  WBC 4.0 - 10.5 K/uL 3.7  3.4  4.0   Hemoglobin 13.0 - 17.0 g/dL 8.5  8.9  9.5   Hematocrit 39.0 - 52.0 % 26.3  28.0  29.7   Platelets 150 - 400 K/uL 196  205  240         Latest Ref Rng & Units 05/14/2023    7:38 AM 04/30/2023    7:38 AM 04/16/2023    7:42 AM  CMP  Glucose 70 - 99 mg/dL 84  098  119   BUN 6 - 20 mg/dL 8  12  8    Creatinine 0.61 - 1.24 mg/dL 1.47  8.29  5.62   Sodium 135 - 145 mmol/L 141  140  141   Potassium 3.5 - 5.1 mmol/L 4.3  4.5  4.2   Chloride 98 - 111 mmol/L 110  108  110   CO2 22 - 32 mmol/L 26  26  26    Calcium  8.9 - 10.3 mg/dL 9.0  8.9  9.0   Total Protein 6.5 - 8.1 g/dL 7.1  7.2  7.4   Total Bilirubin 0.0 - 1.2 mg/dL 0.4  0.5  0.4   Alkaline Phos 38 - 126 U/L 76  79  80   AST 15 - 41 U/L 24  27  20    ALT 0 - 44 U/L 14  12  10        RADIOGRAPHIC STUDIES: I have personally reviewed the radiological images as listed and agreed with the findings in the report. No results found.    Orders Placed This Encounter  Procedures   DG Shoulder 1V Left    Standing Status:   Future    Number of Occurrences:   1    Expiration Date:   05/13/2024    Reason for Exam (SYMPTOM  OR DIAGNOSIS REQUIRED):   left shoulder pain and limited ROM    Preferred imaging location?:   [facility]   Ferritin    Standing Status:   Standing    Number of Occurrences:   20    Expiration Date:   05/13/2024   All questions were answered. The patient  knows to call the clinic with any problems, questions or concerns. No barriers to learning was detected. The total time spent in the appointment was 30 minutes, including review of chart and various tests results, discussions about plan of care and coordination of care plan     Sonja [place], MD 05/14/2023

## 2023-05-15 ENCOUNTER — Encounter: Payer: Self-pay | Admitting: Hematology

## 2023-05-19 ENCOUNTER — Other Ambulatory Visit: Payer: Self-pay

## 2023-05-28 ENCOUNTER — Inpatient Hospital Stay

## 2023-05-28 ENCOUNTER — Other Ambulatory Visit: Payer: Self-pay

## 2023-05-28 ENCOUNTER — Inpatient Hospital Stay (HOSPITAL_BASED_OUTPATIENT_CLINIC_OR_DEPARTMENT_OTHER): Admitting: Hematology

## 2023-05-28 VITALS — BP 131/79

## 2023-05-28 VITALS — BP 138/80 | HR 74 | Temp 98.1°F | Resp 17 | Wt 170.5 lb

## 2023-05-28 DIAGNOSIS — C163 Malignant neoplasm of pyloric antrum: Secondary | ICD-10-CM

## 2023-05-28 DIAGNOSIS — Z5112 Encounter for antineoplastic immunotherapy: Secondary | ICD-10-CM | POA: Diagnosis not present

## 2023-05-28 DIAGNOSIS — Z95828 Presence of other vascular implants and grafts: Secondary | ICD-10-CM

## 2023-05-28 LAB — CBC WITH DIFFERENTIAL (CANCER CENTER ONLY)
Abs Immature Granulocytes: 0.01 10*3/uL (ref 0.00–0.07)
Basophils Absolute: 0 10*3/uL (ref 0.0–0.1)
Basophils Relative: 1 %
Eosinophils Absolute: 0.1 10*3/uL (ref 0.0–0.5)
Eosinophils Relative: 3 %
HCT: 25.8 % — ABNORMAL LOW (ref 39.0–52.0)
Hemoglobin: 8.3 g/dL — ABNORMAL LOW (ref 13.0–17.0)
Immature Granulocytes: 0 %
Lymphocytes Relative: 30 %
Lymphs Abs: 1 10*3/uL (ref 0.7–4.0)
MCH: 25.5 pg — ABNORMAL LOW (ref 26.0–34.0)
MCHC: 32.2 g/dL (ref 30.0–36.0)
MCV: 79.1 fL — ABNORMAL LOW (ref 80.0–100.0)
Monocytes Absolute: 0.5 10*3/uL (ref 0.1–1.0)
Monocytes Relative: 16 %
Neutro Abs: 1.7 10*3/uL (ref 1.7–7.7)
Neutrophils Relative %: 50 %
Platelet Count: 176 10*3/uL (ref 150–400)
RBC: 3.26 MIL/uL — ABNORMAL LOW (ref 4.22–5.81)
RDW: 21 % — ABNORMAL HIGH (ref 11.5–15.5)
WBC Count: 3.4 10*3/uL — ABNORMAL LOW (ref 4.0–10.5)
nRBC: 0 % (ref 0.0–0.2)

## 2023-05-28 LAB — CMP (CANCER CENTER ONLY)
ALT: 16 U/L (ref 0–44)
AST: 27 U/L (ref 15–41)
Albumin: 4 g/dL (ref 3.5–5.0)
Alkaline Phosphatase: 87 U/L (ref 38–126)
Anion gap: 6 (ref 5–15)
BUN: 11 mg/dL (ref 6–20)
CO2: 26 mmol/L (ref 22–32)
Calcium: 9 mg/dL (ref 8.9–10.3)
Chloride: 109 mmol/L (ref 98–111)
Creatinine: 0.71 mg/dL (ref 0.61–1.24)
GFR, Estimated: >60 mL/min (ref >60)
Glucose, Bld: 84 mg/dL (ref 70–99)
Potassium: 4.2 mmol/L (ref 3.5–5.1)
Sodium: 141 mmol/L (ref 135–145)
Total Bilirubin: 0.4 mg/dL (ref 0.0–1.2)
Total Protein: 7.2 g/dL (ref 6.5–8.1)

## 2023-05-28 LAB — CEA (ACCESS): CEA (CHCC): 110.79 ng/mL — ABNORMAL HIGH (ref 0.00–5.00)

## 2023-05-28 MED ORDER — SODIUM CHLORIDE 0.9% FLUSH
10.0000 mL | Freq: Once | INTRAVENOUS | Status: AC
  Administered 2023-05-28: 10 mL

## 2023-05-28 MED ORDER — SODIUM CHLORIDE 0.9% FLUSH
10.0000 mL | INTRAVENOUS | Status: DC | PRN
  Administered 2023-05-28: 10 mL

## 2023-05-28 MED ORDER — SODIUM CHLORIDE 0.9 % IV SOLN
80.0000 mg/m2 | Freq: Once | INTRAVENOUS | Status: AC
  Administered 2023-05-28: 150 mg via INTRAVENOUS
  Filled 2023-05-28: qty 25

## 2023-05-28 MED ORDER — HEPARIN SOD (PORK) LOCK FLUSH 100 UNIT/ML IV SOLN
500.0000 [IU] | Freq: Once | INTRAVENOUS | Status: AC | PRN
  Administered 2023-05-28: 500 [IU]

## 2023-05-28 MED ORDER — DIPHENHYDRAMINE HCL 50 MG/ML IJ SOLN
25.0000 mg | Freq: Once | INTRAMUSCULAR | Status: AC
  Administered 2023-05-28: 25 mg via INTRAVENOUS
  Filled 2023-05-28: qty 1

## 2023-05-28 MED ORDER — FAMOTIDINE IN NACL 20-0.9 MG/50ML-% IV SOLN
20.0000 mg | Freq: Once | INTRAVENOUS | Status: AC
  Administered 2023-05-28: 20 mg via INTRAVENOUS
  Filled 2023-05-28: qty 50

## 2023-05-28 MED ORDER — SODIUM CHLORIDE 0.9 % IV SOLN: INTRAVENOUS | Status: DC

## 2023-05-28 MED ORDER — SODIUM CHLORIDE 0.9 % IV SOLN
240.0000 mg | Freq: Once | INTRAVENOUS | Status: AC
  Administered 2023-05-28: 240 mg via INTRAVENOUS
  Filled 2023-05-28: qty 24

## 2023-05-28 MED ORDER — SODIUM CHLORIDE 0.9 % IV SOLN
8.0000 mg/kg | Freq: Once | INTRAVENOUS | Status: AC
  Administered 2023-05-28: 600 mg via INTRAVENOUS
  Filled 2023-05-28: qty 10

## 2023-05-28 MED ORDER — ACETAMINOPHEN 325 MG PO TABS
650.0000 mg | ORAL_TABLET | Freq: Once | ORAL | Status: AC
  Administered 2023-05-28: 650 mg via ORAL
  Filled 2023-05-28: qty 2

## 2023-05-28 NOTE — Progress Notes (Signed)
 [facility]   Telephone:([phone] [phone]   Clinic Follow up Note   Patient Care Team: Pcp, No as PCP - Drinda Gentry, MD as Consulting Physician (Hematology and Oncology)  Date of Service:  05/28/2023  CHIEF COMPLAINT: f/u of metastatic cancer  CURRENT THERAPY:  Paclitaxel , nivolumab  and ramucirumab  every 2 weeks  Oncology History   Gastric cancer (HCC)  cT3N3Mx, with hypermetabolic left supraclavicular node, and indeterminate adrenal nodule, MMR normal ypT3ypN3a, liver and nodes recurrence in 07/2022 -diagnosed 10/18/21 by EGD for 6 month f/u of gastric ulcers, showed mucosal intramucosal adenocarcinoma. MMR normal. -baseline CEA 11/01/21 significantly elevated at 1,989.58. -EUS on 11/06/21 by Dr. Kimble Pennant, staged as T3 N3 (at least stage III) -staging CT CAP 11/08/21 showed: stable gastric antrum wall thickening; increased size of upper abdominal lymph nodes; stable left adrenal nodule, 1.9 cm. -PET scan showed FDG avid lymph nodes within the gastrohepatic ligament, portacaval region, and mesentery. Tracer avid left supraclavicular lymph node and indeterminate left adrenal gland nodule with mild FDG uptake. -he underwent [place] node biopsy on 1/17 which was negative for malignant cells, but no lymphoid tissue seen on biopsy  -repeated PET on 02/07/22 after 3 months neoadjuvant chemo showed resolved left supraclavicular lymph node, and significant improvement in regional lymph nodes and the primary tumor, stable and indeterminate left adrenal nodule.  -CT adrenal protocol showed the left adrenal nodule is likely benign adenoma, no biopsy is needed. -Completed neoadjvant chemo FLOT s/p 12 cycles 11/22/21 - 04/27/2022 -restaging PET on 05/16/22 showed significantly hypermetabolic activity at the primary gastric cancer in pylorus, and a small hypermetabolic mesenteric lymph node, no other evidence of metastasis. surgeon Dr. Dorrie Gaudier aware of PET findings -S/p  distal gastrectomy 05/20/22, path showed ypT3N3a with clear margins, 7/18 + LNs, and lymphovascular invasion identified. There was some treatment effect in a few lymph nodes nut not in the primary tumor -due to rising tumor marker, he underwent PET scan on July 07, 2022, which unfortunately showed hypermetabolic new adenopathy in right hilar and mesentery, diffuse liver metastasis, consistent with metastatic recurrence.  I personally reviewed the PET scan images with patient -Unfortunately his disease is not curable at this stage. -The goal of chemotherapy is palliative, to prolong his life -I discussed his next generation sequencing foundation one result, which showed EGFR amplification, no other targetable mutations.  His tumor was previously tested for HER2 which was negative (0-1+).  PD-L1 CPS score was 2%, no significant benefit of immunotherapy.  -He started paclitaxel  and ramucirumab  on 07/25/2022  He developed worsening neuropathy quickly, and I had to switch his treatment to FOLFIRI on 08/29/2022, every 2 weeks, he is overall tolerating well. -restaging CT 01/19/2023 showed disease progression with new pathologic right paratracheal mediastinal lymph nodes as well as increasing size of several liver metastases. -I changed his chemo to taxol  and ramucizumab on 02/05/2023, every 2 weeks per his request  -restaging CT 04/08/2023 showed partial response in liver and mediastinal nodes    Assessment & Plan Metastatic gastric cancer Metastatic gastric cancer with tumor markers reduced from 600 in January to 96, reflecting CT scan findings of mixed response with slight worsening. Treatment continues with dose reduction due to neuropathy, balancing treatment benefits with side effects. Potential cessation of treatment if neuropathy worsens despite efficacy. - Proceed with current treatment regimen. - Reduce chemotherapy dose from 100 to 80 to manage neuropathy. - Repeat tumor marker test today. - Schedule  next treatment for June 5. - Plan  for a CT scan by the end of June to assess treatment efficacy.  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 Gabapentin  manages tingling and pain but not numbness. Dose reduction planned to mitigate symptoms. Advised caution to prevent falls due to balance issues. - Reduce chemotherapy dose to manage neuropathy. - Continue gabapentin  for symptom management. - Consider disability and discuss with Child psychotherapist for guidance. - Limit work hours to manage neuropathy symptoms.  Anemia due to chemotherapy Anemia secondary to chemotherapy with hemoglobin at 8.3. Iron  levels normal, attributed to chemotherapy. No shortness of breath. Mild leukopenia due to chemotherapy but acceptable for treatment. Normal platelet count. - Monitor hemoglobin levels and consider blood transfusion if hemoglobin drops below 8. - Request extra time during next visit for potential blood transfusion.  Plan - Due to his worsening neuropathy, I will reduce paclitaxel  dose to 80 mg/m - Lab reviewed, adequate for treatment, will proceed to chemotherapy today and continue every 2 weeks - Will request 2-hour blood transfusion for his next appointment in 2 weeks - Follow-up in 2 weeks. - Plan to repeat restaging CT scan in late June. - I encouraged him to talk to social worker Amalia Badder about disability application.   SUMMARY OF ONCOLOGIC HISTORY: Oncology History Overview Note   Cancer Staging  Gastric cancer [facility]) Staging form: Stomach, AJCC 8th Edition - Clinical stage from 10/18/2021: Stage III (cT3, cN3a, cM0) - Signed by Sonja Pinewood, MD on 11/10/2021     Gastric cancer (HCC)  10/18/2021 Procedure   EGD performed under the care of Dr. Honey Lusty  Findings:  -A large, ulcerated, partially circumferential (involving two thirds of the lumen circumference) mass  with no bleeding and stigmata of recent bleeding was found in the gastric antrum, at the pylorus and in the prepyloric region of the stomach. -Segmental severe inflammation characterized by congestion (edema) and erythema was found in the gastric antrum.   10/18/2021 Cancer Staging   Staging form: Stomach, AJCC 8th Edition - Clinical stage from 10/18/2021: Stage III (cT3, cN3a, cM0) - Signed by Sonja Byers, MD on 11/10/2021 Stage prefix: Initial diagnosis Histologic grade (G): GX Histologic grading system: 3 grade system   10/29/2021 Pathology Results   Stomach, antrum, pylorus, biopsy: -AT LEAST MUCOSA INTRAMUCOSAL ADENOCARCINOMA ARISING WITHIN CHRONIC INACTIVE GASTRITIS WITH INTESTINAL METAPLASIA (INCOMPLETE TYPE). Negative for dysplasia.  Negative for helicobacter pylori  Mismatch Repair (MMR) Protein Imunohistochemistry (IHC): IHC Expression Result: MLH1: Preserved nuclear expression. MSH2: Preserved nuclear expression. MSH6: Preserved nuclear expression. PMS2: Preserved nuclear expression. Interpretation: NORMAL   10/31/2021 Initial Diagnosis   Gastric cancer (HCC)   11/01/2021 Tumor Marker   CEA = 1,989.58 (^)   11/06/2021 Procedure   Upper EUS, Dr. Kimble Pennant  Impression: - Normal esophagus. - A small amount of food (residue) in the stomach. - Congested, friable (with contact bleeding) and ulcerated mucosa in the prepyloric region of the stomach. - Normal examined duodenum. - There was no evidence of significant pathology in the left lobe of the liver. - Many abnormal lymph nodes (over 10) were visualized in the gastrohepatic ligament (level 18), celiac region (level 20), perigastric region, peripancreatic region and aortocaval region. - Wall thickening was seen in the prepyloric region of the stomach. The thickening appeared to be primarily within the deep mucosa (Layer 2) but extended through the muscularis propria. - Overall constellation of findings consistent with at least  T3 N3 Mx (at least stage III) gastric adenocarcinoma.  11/08/2021 Imaging   EXAM: CT CHEST, ABDOMEN, AND PELVIS WITH CONTRAST  IMPRESSION: 1. Similar irregular wall thickening of the gastric antrum, consistent with patient's known primary gastric neoplasm. 2. Increased size of the upper abdominal lymph nodes, concerning for worsening nodal disease involvement. 3. Stable left adrenal nodule common nonspecific possibly a metastatic lesion or adenoma. 4. No evidence of metastatic disease in the chest. 5. Left-sided colonic diverticulosis without findings of acute diverticulitis. 6. Enlarged prostate gland. 7.  Aortic Atherosclerosis (ICD10-I70.0).   11/22/2021 - 04/26/2022 Chemotherapy   Patient is on Treatment Plan : GASTROESOPHAGEAL FLOT q14d X 4 cycles     11/27/2021 Imaging    IMPRESSION: 1. The mass within the gastric antrum is mildly decreased in size when compared with 11/08/2021. There is persistent FDG uptake associated with this mass compatible with residual tumor. 2. Persistent FDG avid lymph nodes within the gastrohepatic ligament, portacaval region, and mesentery. These are mildly decreased in size when compared with 11/08/2021. 3. Tracer avid left supraclavicular lymph node is also mildly decreased in size when compared with 11/08/2021. 4. Indeterminate left adrenal gland nodule with mild FDG uptake. This may represent a lipid poor adenoma. Metastatic disease not exclude. 5. Diffuse increased uptake throughout the bone marrow is favored to represent treatment related change. 6.  Aortic Atherosclerosis (ICD10-I70.0).     02/07/2022 Imaging    IMPRESSION: 1. No substantial change in hypermetabolism associated with the distal gastric lesion. Although difficult to discern on noncontrast CT imaging, the soft tissue component appears to be decreased since the prior PET-CT. 2. Interval resolution of the hypermetabolic left supraclavicular lymph node. 3. Interval  marked decrease of the hypermetabolic gastrohepatic ligament lymphadenopathy. Similar decrease in size and hypermetabolism associated with index nodes identified previously in the 4. porta hepatis and hypogastric region. 5. Stable left adrenal nodule with slight hypermetabolism. Nonspecific finding could represent lipid poor adenoma or metastatic deposit. 6. No new suspicious hypermetabolic disease on today's study. 7.  Aortic Atherosclerosis (ICD10-I70.0).   07/25/2022 - 08/15/2022 Chemotherapy   Patient is on Treatment Plan : GASTROESOPHAGEAL Ramucirumab  D1, 15 + Paclitaxel  D1,8,15 q28d     08/29/2022 - 01/24/2023 Chemotherapy   Patient is on Treatment Plan : COLORECTAL FOLFIRI q14d     02/05/2023 -  Chemotherapy   Patient is on Treatment Plan : GASTROESOPHAGEAL Ramucirumab  D1, 15 + Paclitaxel  D1,8,15 q28d        Discussed the use of AI scribe software for clinical note transcription with the patient, who gave verbal consent to proceed.  History of Present Illness Jonathan Maynard "Larinda Plover" is a 53 year old male with metastatic gastric cancer who presents for follow-up.  He experiences neuropathy in his hands and feet, causing difficulty at work and impacting daily function. Gabapentin  alleviates tingling and pain but not numbness, and is more effective when taken after eating. Neuropathy leads to staggering and morning foot pain.  He has lost two to three pounds recently. Despite eating, his wife feels his intake is insufficient. He feels cold and more anemic but has no shortness of breath. Iron  levels, previously low, are now normal.  His tumor marker was stable at 96 during the last check, down from a high of 600 in late January.     All other systems were reviewed with the patient and are negative.  MEDICAL HISTORY:  Past Medical History:  Diagnosis Date   Acute upper GI bleed 04/12/2021   Anemia    Cancer (HCC)    stomach  Class 1 obesity 04/12/2021    Diverticulosis 04/12/2021   GERD (gastroesophageal reflux disease)    Hepatic steatosis 04/12/2021   Neuromuscular disorder (HCC)    neuropathy from chemo    SURGICAL HISTORY: Past Surgical History:  Procedure Laterality Date   BIOPSY  04/12/2021   Procedure: BIOPSY;  Surgeon: Baldo Bonds, MD;  Location: [facility];  Service: Gastroenterology;;   ESOPHAGOGASTRODUODENOSCOPY N/A 04/12/2021   Procedure: ESOPHAGOGASTRODUODENOSCOPY (EGD);  Surgeon: Baldo Bonds, MD;  Location: [facility];  Service: Gastroenterology;  Laterality: N/A;   ESOPHAGOGASTRODUODENOSCOPY N/A 11/06/2021   Procedure: ESOPHAGOGASTRODUODENOSCOPY (EGD);  Surgeon: Evangeline Hilts, MD;  Location: [facility];  Service: Gastroenterology;  Laterality: N/A;   GASTRECTOMY N/A 05/20/2022   Procedure: OPEN PARTIAL GASTRECTOMY WITH GASTROJEJUNAL ANASTOMOSIS;  Surgeon: Dorrie Gaudier, Alphonso Aschoff, MD;  Location: [facility];  Service: General;  Laterality: N/A;   HERNIA REPAIR Left    inguinal   IR IMAGING GUIDED PORT INSERTION  11/15/2021   LAPAROSCOPY N/A 05/20/2022   Procedure: LAPAROSCOPY DIAGNOSTIC;  Surgeon: Derral Flick, MD;  Location: [facility];  Service: General;  Laterality: N/A;   UPPER ESOPHAGEAL ENDOSCOPIC ULTRASOUND (EUS) Bilateral 11/06/2021   Procedure: UPPER ESOPHAGEAL ENDOSCOPIC ULTRASOUND (EUS);  Surgeon: Evangeline Hilts, MD;  Location: [facility];  Service: Gastroenterology;  Laterality: Bilateral;    I have reviewed the social history and family history with the patient and they are unchanged from previous note.  ALLERGIES:  has no known allergies.  MEDICATIONS:  Current Outpatient Medications  Medication Sig Dispense Refill   acetaminophen  (TYLENOL ) 500 MG tablet Take 1 tablet (500 mg total) by mouth 2 (two) times daily. 60 tablet 3   gabapentin  (NEURONTIN ) 300 MG capsule Take 1 capsule (300 mg total) by mouth 3 (three) times daily. 90 capsule 2   magic mouthwash (nystatin , lidocaine ,  diphenhydrAMINE , alum & mag hydroxide) suspension Swish for 5 seconds and swallow 5 mLs by mouth 3 (three) times daily as needed for mouth pain. 140 mL 0   ondansetron  (ZOFRAN -ODT) 4 MG disintegrating tablet Dissolve 1 tablet (4 mg total) by mouth every 6 (six) hours as needed for nausea or vomiting. 30 tablet 1   pantoprazole  (PROTONIX ) 40 MG tablet Take 1 tablet (40 mg) by mouth 2 times daily. 60 tablet 6   prochlorperazine  (COMPAZINE ) 10 MG tablet Take 1 tablet (10 mg total) by mouth every 6 (six) hours as needed for nausea or vomiting. 30 tablet 2   No current facility-administered medications for this visit.   Facility-Administered Medications Ordered in Other Visits  Medication Dose Route Frequency Provider Last Rate Last Admin   0.9 %  sodium chloride  infusion   Intravenous Continuous Sonja Gloster, MD 10 mL/hr at 05/28/23 0916 New Bag at 05/28/23 0916   famotidine  (PEPCID ) IVPB 20 mg premix  20 mg Intravenous Once Sonja Rowan, MD 200 mL/hr at 05/28/23 0931 20 mg at 05/28/23 0931   heparin  lock flush 100 unit/mL  500 Units Intracatheter Once PRN Sonja [place], MD       nivolumab  (OPDIVO ) 240 mg in sodium chloride  0.9 % 100 mL chemo infusion  240 mg Intravenous Once Sonja [place], MD       PACLitaxel  (TAXOL ) 150 mg in sodium chloride  0.9 % 250 mL chemo infusion (> 80mg /m2)  80 mg/m2 (Treatment Plan Recorded) Intravenous Once Sonja [place], MD       ramucirumab  (CYRAMZA ) 600 mg in sodium chloride  0.9 % 190 mL chemo infusion  8 mg/kg (Treatment Plan Recorded) Intravenous Once Sonja , MD  sodium chloride  flush (NS) 0.9 % injection 10 mL  10 mL Intracatheter PRN Sonja [place], MD        PHYSICAL EXAMINATION: ECOG PERFORMANCE STATUS: 1 - Symptomatic but completely ambulatory  Vitals:   05/28/23 0828  BP: 138/80  Pulse: 74  Resp: 17  Temp: 98.1 F (36.7 C)  SpO2: 99%   Wt Readings from Last 3 Encounters:  05/28/23 170 lb 8 oz (77.3 kg)  05/14/23 174 lb 8 oz (79.2 kg)  04/30/23 172 lb 1.6 oz (78.1  kg)     GENERAL:alert, no distress and comfortable SKIN: skin color, texture, turgor are normal, no rashes or significant lesions EYES: normal, Conjunctiva are pink and non-injected, sclera clear NECK: supple, thyroid  normal size, non-tender, without nodularity LYMPH:  no palpable lymphadenopathy in the cervical, axillary  LUNGS: clear to auscultation and percussion with normal breathing effort HEART: regular rate & rhythm and no murmurs and no lower extremity edema ABDOMEN:abdomen soft, non-tender and normal bowel sounds Musculoskeletal:no cyanosis of digits and no clubbing  NEURO: alert & oriented x 3 with fluent speech, no focal motor/sensory deficits  Physical Exam   LABORATORY DATA:  I have reviewed the data as listed    Latest Ref Rng & Units 05/28/2023    8:04 AM 05/14/2023    7:38 AM 04/30/2023    7:38 AM  CBC  WBC 4.0 - 10.5 K/uL 3.4  3.7  3.4   Hemoglobin 13.0 - 17.0 g/dL 8.3  8.5  8.9   Hematocrit 39.0 - 52.0 % 25.8  26.3  28.0   Platelets 150 - 400 K/uL 176  196  205         Latest Ref Rng & Units 05/28/2023    8:04 AM 05/14/2023    7:38 AM 04/30/2023    7:38 AM  CMP  Glucose 70 - 99 mg/dL 84  84  161   BUN 6 - 20 mg/dL 11  8  12    Creatinine 0.61 - 1.24 mg/dL 0.96  0.45  4.09   Sodium 135 - 145 mmol/L 141  141  140   Potassium 3.5 - 5.1 mmol/L 4.2  4.3  4.5   Chloride 98 - 111 mmol/L 109  110  108   CO2 22 - 32 mmol/L 26  26  26    Calcium  8.9 - 10.3 mg/dL 9.0  9.0  8.9   Total Protein 6.5 - 8.1 g/dL 7.2  7.1  7.2   Total Bilirubin 0.0 - 1.2 mg/dL 0.4  0.4  0.5   Alkaline Phos 38 - 126 U/L 87  76  79   AST 15 - 41 U/L 27  24  27    ALT 0 - 44 U/L 16  14  12        RADIOGRAPHIC STUDIES: I have personally reviewed the radiological images as listed and agreed with the findings in the report. No results found.    Orders Placed This Encounter  Procedures   CBC with Differential (Cancer Center Only)    Standing Status:   Future    Expected Date:   06/25/2023     Expiration Date:   06/24/2024   CMP (Cancer Center only)    Standing Status:   Future    Expected Date:   06/25/2023    Expiration Date:   06/24/2024   T4    Standing Status:   Future    Expected Date:   06/25/2023    Expiration Date:   06/24/2024  TSH    Standing Status:   Future    Expected Date:   06/25/2023    Expiration Date:   06/24/2024   Total Protein, Urine dipstick    Standing Status:   Future    Expected Date:   06/25/2023    Expiration Date:   06/24/2024   CBC with Differential (Cancer Center Only)    Standing Status:   Future    Expected Date:   07/09/2023    Expiration Date:   07/08/2024   CMP (Cancer Center only)    Standing Status:   Future    Expected Date:   07/09/2023    Expiration Date:   07/08/2024   Sample to Blood Bank    Standing Status:   Future    Expected Date:   06/11/2023    Expiration Date:   05/27/2024   All questions were answered. The patient knows to call the clinic with any problems, questions or concerns. No barriers to learning was detected. The total time spent in the appointment was 40 minutes, including review of chart and various tests results, discussions about plan of care and coordination of care plan     Sonja , MD 05/28/2023

## 2023-05-28 NOTE — Assessment & Plan Note (Signed)
  cT3N3Mx, with hypermetabolic left supraclavicular node, and indeterminate adrenal nodule, MMR normal ypT3ypN3a, liver and nodes recurrence in 07/2022 -diagnosed 10/18/21 by EGD for 6 month f/u of gastric ulcers, showed mucosal intramucosal adenocarcinoma. MMR normal. -baseline CEA 11/01/21 significantly elevated at 1,989.58. -EUS on 11/06/21 by Dr. Dulce Sellar, staged as T3 N3 (at least stage III) -staging CT CAP 11/08/21 showed: stable gastric antrum wall thickening; increased size of upper abdominal lymph nodes; stable left adrenal nodule, 1.9 cm. -PET scan showed FDG avid lymph nodes within the gastrohepatic ligament, portacaval region, and mesentery. Tracer avid left supraclavicular lymph node and indeterminate left adrenal gland nodule with mild FDG uptake. -he underwent [place] node biopsy on 1/17 which was negative for malignant cells, but no lymphoid tissue seen on biopsy  -repeated PET on 02/07/22 after 3 months neoadjuvant chemo showed resolved left supraclavicular lymph node, and significant improvement in regional lymph nodes and the primary tumor, stable and indeterminate left adrenal nodule.  -CT adrenal protocol showed the left adrenal nodule is likely benign adenoma, no biopsy is needed. -Completed neoadjvant chemo FLOT s/p 12 cycles 11/22/21 - 04/27/2022 -restaging PET on 05/16/22 showed significantly hypermetabolic activity at the primary gastric cancer in pylorus, and a small hypermetabolic mesenteric lymph node, no other evidence of metastasis. surgeon Dr. Sheliah Hatch aware of PET findings -S/p distal gastrectomy 05/20/22, path showed ypT3N3a with clear margins, 7/18 + LNs, and lymphovascular invasion identified. There was some treatment effect in a few lymph nodes nut not in the primary tumor -due to rising tumor marker, he underwent PET scan on July 07, 2022, which unfortunately showed hypermetabolic new adenopathy in right hilar and mesentery, diffuse liver metastasis, consistent with metastatic  recurrence.  I personally reviewed the PET scan images with patient -Unfortunately his disease is not curable at this stage. -The goal of chemotherapy is palliative, to prolong his life -I discussed his next generation sequencing foundation one result, which showed EGFR amplification, no other targetable mutations.  His tumor was previously tested for HER2 which was negative (0-1+).  PD-L1 CPS score was 2%, no significant benefit of immunotherapy.  -He started paclitaxel and ramucirumab on 07/25/2022  He developed worsening neuropathy quickly, and I had to switch his treatment to FOLFIRI on 08/29/2022, every 2 weeks, he is overall tolerating well. -restaging CT 01/19/2023 showed disease progression with new pathologic right paratracheal mediastinal lymph nodes as well as increasing size of several liver metastases. -I changed his chemo to taxol and ramucizumab on 02/05/2023, every 2 weeks per his request  -restaging CT 04/08/2023 showed partial response in liver and mediastinal nodes

## 2023-05-29 ENCOUNTER — Ambulatory Visit: Payer: Self-pay | Admitting: Nurse Practitioner

## 2023-06-05 ENCOUNTER — Other Ambulatory Visit: Payer: Self-pay

## 2023-06-10 NOTE — Assessment & Plan Note (Signed)
  cT3N3Mx, with hypermetabolic left supraclavicular node, and indeterminate adrenal nodule, MMR normal ypT3ypN3a, liver and nodes recurrence in 07/2022 -diagnosed 10/18/21 by EGD for 6 month f/u of gastric ulcers, showed mucosal intramucosal adenocarcinoma. MMR normal. -baseline CEA 11/01/21 significantly elevated at 1,989.58. -EUS on 11/06/21 by Dr. Kimble Pennant, staged as T3 N3 (at least stage III) -staging CT CAP 11/08/21 showed: stable gastric antrum wall thickening; increased size of upper abdominal lymph nodes; stable left adrenal nodule, 1.9 cm. -PET scan showed FDG avid lymph nodes within the gastrohepatic ligament, portacaval region, and mesentery. Tracer avid left supraclavicular lymph node and indeterminate left adrenal gland nodule with mild FDG uptake. -he underwent [place] node biopsy on 1/17 which was negative for malignant cells, but no lymphoid tissue seen on biopsy  -repeated PET on 02/07/22 after 3 months neoadjuvant chemo showed resolved left supraclavicular lymph node, and significant improvement in regional lymph nodes and the primary tumor, stable and indeterminate left adrenal nodule.  -CT adrenal protocol showed the left adrenal nodule is likely benign adenoma, no biopsy is needed. -Completed neoadjvant chemo FLOT s/p 12 cycles 11/22/21 - 04/27/2022 -restaging PET on 05/16/22 showed significantly hypermetabolic activity at the primary gastric cancer in pylorus, and a small hypermetabolic mesenteric lymph node, no other evidence of metastasis. surgeon Dr. Dorrie Gaudier aware of PET findings -S/p distal gastrectomy 05/20/22, path showed ypT3N3a with clear margins, 7/18 + LNs, and lymphovascular invasion identified. There was some treatment effect in a few lymph nodes nut not in the primary tumor -due to rising tumor marker, he underwent PET scan on July 07, 2022, which unfortunately showed hypermetabolic new adenopathy in right hilar and mesentery, diffuse liver metastasis, consistent with metastatic  recurrence.  I personally reviewed the PET scan images with patient -Unfortunately his disease is not curable at this stage. -The goal of chemotherapy is palliative, to prolong his life -I discussed his next generation sequencing foundation one result, which showed EGFR amplification, no other targetable mutations.  His tumor was previously tested for HER2 which was negative (0-1+).  PD-L1 CPS score was 2%, no significant benefit of immunotherapy.  -He started paclitaxel  and ramucirumab  on 07/25/2022  He developed worsening neuropathy quickly, and I had to switch his treatment to FOLFIRI on 08/29/2022, every 2 weeks, he is overall tolerating well. -restaging CT 01/19/2023 showed disease progression with new pathologic right paratracheal mediastinal lymph nodes as well as increasing size of several liver metastases. -His chemo was changed to taxol  and ramucizumab on 02/05/2023, every 2 weeks per his request  -restaging CT 04/08/2023 showed partial response in liver and mediastinal nodes  -due for new PET scan by endo of June 2025.

## 2023-06-10 NOTE — Progress Notes (Unsigned)
 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 MMR normal. -baseline CEA 11/01/21 significantly elevated at 1,989.58. -EUS on 11/06/21 by Dr. Kimble Pennant, staged as T3 N3 (at least stage III) -staging CT CAP 11/08/21 showed: stable gastric antrum wall thickening; increased size of upper abdominal lymph nodes; stable left adrenal nodule, 1.9 cm. -PET scan showed FDG avid lymph nodes within the gastrohepatic ligament, portacaval region, and mesentery. Tracer avid left supraclavicular lymph node and indeterminate left adrenal gland nodule with mild FDG uptake. -he underwent [place] node biopsy on 1/17 which was negative for malignant cells, but no lymphoid tissue seen on biopsy  -repeated PET on 02/07/22 after 3 months neoadjuvant chemo showed resolved left supraclavicular lymph node, and significant improvement in regional lymph nodes and the primary tumor, stable and indeterminate left adrenal nodule.  -CT adrenal protocol showed the left adrenal nodule is likely benign adenoma, no biopsy is needed. -Completed neoadjvant chemo FLOT s/p 12 cycles 11/22/21 - 04/27/2022 -restaging PET on 05/16/22 showed significantly hypermetabolic activity at the primary gastric cancer in pylorus, and a small hypermetabolic mesenteric lymph node, no other evidence of metastasis. surgeon Dr. Dorrie Gaudier aware of PET findings -S/p distal gastrectomy 05/20/22, path showed ypT3N3a with clear margins, 7/18 + LNs, and lymphovascular invasion identified. There was some treatment effect in a few lymph  nodes nut not in the primary tumor -due to rising tumor marker, he underwent PET scan on July 07, 2022, which unfortunately showed hypermetabolic new adenopathy in right hilar and mesentery, diffuse liver metastasis, consistent with metastatic recurrence.  I personally reviewed the PET scan images with patient -Unfortunately his disease is not curable at this stage. -The goal of chemotherapy is palliative, to prolong his life -I discussed his next generation sequencing foundation one result, which showed EGFR amplification, no other targetable mutations.  His tumor was previously tested for HER2 which was negative (0-1+).  PD-L1 CPS score was 2%, no significant benefit of immunotherapy.  -He started paclitaxel  and ramucirumab  on 07/25/2022  He developed worsening neuropathy quickly, and I had to switch his treatment to FOLFIRI on 08/29/2022, every 2 weeks, he is overall tolerating well. -restaging CT 01/19/2023 showed disease progression with new pathologic right paratracheal mediastinal lymph nodes as well as increasing size of several liver metastases. -His chemo was changed to taxol  and ramucizumab on 02/05/2023, every 2 weeks per his request  -restaging CT 04/08/2023 showed partial response in liver and mediastinal nodes  - 06/11/2023 -patient tolerating current chemotherapy without severe side effects.  Does have persistent neuropathy, but not worsening.  Proceed with cycle 5 day 15 of chemotherapy with paclitaxel , nivolumab , and ramucirumab .  Continue with treatment every 2 weeks.  Restaging PET scan ordered during today's visit.    Neuropathy Patient has baseline neuropathy.  Has not become any worse.  Patient currently taking 900 mg gabapentin  3 times daily.  Changed gabapentin  to 600 mg tablets.  May take 2 tablets 3 times daily.  New prescription sent to the pharmacy.  Skin changes Patient has noticed skin peeling and pain on the bottoms of his feet.  Wearing compression socks.  Does not seem to  help.  Taking gabapentin  does not seem to help pain associated with the skin peeling.  Will add Voltaren gel.  Patient may use up to 4 times daily when needed.  Okay to apply compression socks after application of Voltaren gel.  Anemia Hgb 8.2 and HCT 26.0.  Currently, does not need IV iron  or blood transfusion today.  Recheck with every visit and treat as indicated.  Plan Labs reviewed - Stable, moderate anemia.  No IV iron  or blood transfusion today. -CMP unremarkable. - Patient labs and condition are appropriate for treatment today. - Proceed with cycle 5 day 15 chemotherapy with paclitaxel , nivolumab , and ramucirumab . - Restaging PET scan ordered today.  Plan for end of June. - Labs/flush, follow-up, and next treatment in 2 weeks.  The patient understands the plans discussed today and is in agreement with them.  He knows to contact our office if he develops concerns prior to his next appointment.  I provided 30 minutes of face-to-face time during this encounter and > 50% was spent counseling as documented under my assessment and plan.    [name], NP  [facility] - A DEPT OF [facility] 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 Today is cycle 5 day 15. Should be scheduled for repeat PET  scan by the end of this month.  PET scan ordered today.  He reports feeling well overall.  Continues to have baseline neuropathy.  Currently taking three 300 mg gabapentin , 3 times daily.  States this is sometimes not controlling the neuropathy.  It is worse in his feet.  Has also noted some skin peeling and pain, also in his feet. He denies chest pain, chest pressure, or shortness of breath. He denies headaches or visual disturbances. He denies abdominal pain, nausea, vomiting, or changes in bowel or bladder habits.  He denies fevers or chills. He denies pain. His appetite is good. His weight has been stable.  I have reviewed the past medical history, past surgical history, social history and family history with the patient and they are unchanged from previous note.  ALLERGIES:  has no known allergies.  MEDICATIONS:  Current Outpatient Medications  Medication Sig Dispense Refill   acetaminophen  (TYLENOL ) 500 MG tablet Take 1 tablet (500 mg total) by mouth 2 (two) times daily. 60 tablet 3   diclofenac Sodium (VOLTAREN ARTHRITIS PAIN) 1 % GEL Apply 4 g topically 4 (four) times daily. 100 g 3   gabapentin  (NEURONTIN ) 600 MG tablet Take 2 tablets (1,200 mg total) by mouth 3 (three) times daily. 180 tablet 1   magic mouthwash (nystatin , lidocaine , diphenhydrAMINE , alum & mag hydroxide) suspension Swish for 5 seconds and swallow 5 mLs by mouth 3 (  three) times daily as needed for mouth pain. 140 mL 0   ondansetron  (ZOFRAN -ODT) 4 MG disintegrating tablet Dissolve 1 tablet (4 mg total) by mouth every 6 (six) hours as needed for nausea or vomiting. 30 tablet 1   pantoprazole  (PROTONIX ) 40 MG tablet Take 1 tablet (40 mg) by mouth 2 times daily. 60 tablet 6   prochlorperazine  (COMPAZINE ) 10 MG tablet Take 1 tablet (10 mg total) by mouth every 6 (six) hours as needed for nausea or vomiting. 30 tablet 2   No current facility-administered medications for this visit.   Facility-Administered Medications Ordered  in Other Visits  Medication Dose Route Frequency Provider Last Rate Last Admin   0.9 %  sodium chloride  infusion   Intravenous Continuous Sonja Charlack, MD   Stopped at 06/11/23 1247   sodium chloride  flush (NS) 0.9 % injection 10 mL  10 mL Intracatheter PRN Sonja Salisbury, MD   10 mL at 06/11/23 1253    HISTORY OF PRESENT ILLNESS:   Oncology History Overview Note   Cancer Staging  Gastric cancer [facility]) Staging form: Stomach, AJCC 8th Edition - Clinical stage from 10/18/2021: Stage III (cT3, cN3a, cM0) - Signed by Sonja Nelson, MD on 11/10/2021     Gastric cancer (HCC)  10/18/2021 Procedure   EGD performed under the care of Dr. Honey Lusty  Findings:  -A large, ulcerated, partially circumferential (involving two thirds of the lumen circumference) mass with no bleeding and stigmata of recent bleeding was found in the gastric antrum, at the pylorus and in the prepyloric region of the stomach. -Segmental severe inflammation characterized by congestion (edema) and erythema was found in the gastric antrum.   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 Negative for dysplasia.  Negative for helicobacter pylori  Mismatch Repair (MMR) Protein Imunohistochemistry (IHC): IHC Expression Result: MLH1: Preserved nuclear expression. MSH2: Preserved nuclear expression. MSH6: Preserved nuclear expression. PMS2: Preserved nuclear expression. Interpretation: NORMAL   10/31/2021 Initial Diagnosis   Gastric cancer (HCC)   11/01/2021 Tumor Marker   CEA = 9,562.13 (^)   11/06/2021 Procedure    Upper EUS, Dr. Kimble Pennant  Impression: - Normal esophagus. - A small amount of food (residue) in the stomach. - Congested, friable (with contact bleeding) and ulcerated mucosa in the prepyloric region of the stomach. - Normal examined duodenum. - There was no evidence of significant pathology in the left lobe of the liver. - Many abnormal lymph nodes (over 10) were visualized in the gastrohepatic ligament (level 18), celiac region (level 20), perigastric region, peripancreatic region and aortocaval region. - Wall thickening was seen in the prepyloric region of the stomach. The thickening appeared to be primarily within the deep mucosa (Layer 2) but extended through the muscularis propria. - Overall constellation of findings consistent with at least T3 N3 Mx (at least stage III) gastric adenocarcinoma.   11/08/2021 Imaging   EXAM: CT CHEST, ABDOMEN, AND PELVIS WITH CONTRAST  IMPRESSION: 1. Similar irregular wall thickening of the gastric antrum, consistent with patient's known primary gastric neoplasm. 2. Increased size of the upper abdominal lymph nodes, concerning for worsening nodal disease involvement. 3. Stable left adrenal nodule common  nonspecific possibly a metastatic lesion or adenoma. 4. No evidence of metastatic disease in the chest. 5. Left-sided colonic diverticulosis without findings of acute diverticulitis. 6. Enlarged prostate gland. 7.  Aortic Atherosclerosis (ICD10-I70.0).   11/22/2021 - 04/26/2022 Chemotherapy   Patient is on Treatment Plan : GASTROESOPHAGEAL FLOT q14d X 4 cycles     11/27/2021 Imaging    IMPRESSION: 1. The mass within the gastric antrum is mildly decreased in size when compared with 11/08/2021. There is persistent FDG uptake associated with this mass compatible with residual tumor. 2. Persistent FDG avid lymph nodes within the gastrohepatic ligament, portacaval region, and mesentery. These are mildly decreased in size when compared with  11/08/2021. 3. Tracer avid left supraclavicular lymph node is also mildly decreased in size when compared with 11/08/2021. 4. Indeterminate left adrenal gland nodule with mild FDG uptake. This may represent a lipid poor adenoma. Metastatic disease not exclude. 5. Diffuse increased uptake throughout the bone marrow is favored to represent treatment related change. 6.  Aortic Atherosclerosis (ICD10-I70.0).     02/07/2022 Imaging    IMPRESSION: 1. No substantial change in hypermetabolism associated with the distal gastric lesion. Although difficult to discern on noncontrast CT imaging, the soft tissue component appears to be decreased since the prior PET-CT. 2. Interval resolution of the hypermetabolic left supraclavicular lymph node. 3. Interval marked decrease of the hypermetabolic gastrohepatic ligament lymphadenopathy. Similar decrease in size and hypermetabolism associated with index nodes identified previously in the 4. porta hepatis and hypogastric region. 5. Stable left adrenal nodule with slight hypermetabolism. Nonspecific finding could represent lipid poor adenoma or metastatic deposit. 6. No new suspicious hypermetabolic disease on today's study. 7.  Aortic Atherosclerosis (ICD10-I70.0).   07/25/2022 - 08/15/2022 Chemotherapy   Patient is on Treatment Plan : GASTROESOPHAGEAL Ramucirumab  D1, 15 + Paclitaxel  D1,8,15 q28d     08/29/2022 - 01/24/2023 Chemotherapy   Patient is on Treatment Plan : COLORECTAL FOLFIRI q14d     02/05/2023 -  Chemotherapy   Patient is on Treatment Plan : GASTROESOPHAGEAL Ramucirumab  D1, 15 + Paclitaxel  D1,8,15 q28d         REVIEW OF SYSTEMS:   Constitutional: Denies fevers, chills or abnormal weight loss.  Fatigue. Eyes: Denies blurriness of vision Ears, nose, mouth, throat, and face: Denies mucositis or sore throat Respiratory: Denies cough, dyspnea or wheezes Cardiovascular: Denies palpitation, chest discomfort or lower extremity  swelling Gastrointestinal:  Denies nausea, heartburn or change in bowel habits Skin: Denies abnormal skin rashes.  Skin peeling and tenderness on the bottoms of his feet. Lymphatics: Denies new lymphadenopathy or easy bruising Neurological:Denies numbness, tingling or new weaknesses.  States that current prescription for gabapentin , although strong enough to help current neuropathy. Behavioral/Psych: Mood is stable, no new changes  All other systems were reviewed with the patient and are negative.   VITALS:   Today's Vitals   06/11/23 0836 06/11/23 0856  BP: (!) 150/90   Pulse: 64   Resp: 17   Temp: (!) 97.4 F (36.3 C)   SpO2: 99%   Weight: 171 lb 3.2 oz (77.7 kg)   PainSc:  6    Body mass index is 28.49 kg/m.   Wt Readings from Last 3 Encounters:  06/11/23 171 lb 3.2 oz (77.7 kg)  05/28/23 170 lb 8 oz (77.3 kg)  05/14/23 174 lb 8 oz (79.2 kg)    Body mass index is 28.49 kg/m.  Performance status (ECOG): 1 - Symptomatic but completely ambulatory  PHYSICAL EXAM:   GENERAL:alert, no  distress and comfortable SKIN: skin color, texture, turgor are normal, no rashes or significant lesions EYES: normal, Conjunctiva are pink and non-injected, sclera clear OROPHARYNX:no exudate, no erythema and lips, buccal mucosa, and tongue normal  NECK: supple, thyroid  normal size, non-tender, without nodularity LYMPH:  no palpable lymphadenopathy in the cervical, axillary or inguinal LUNGS: clear to auscultation and percussion with normal breathing effort HEART: regular rate & rhythm and no murmurs and no lower extremity edema ABDOMEN:abdomen soft, non-tender and normal bowel sounds Musculoskeletal:no cyanosis of digits and no clubbing  NEURO: alert & oriented x 3 with fluent speech, no focal motor/sensory deficits  LABORATORY DATA:  I have reviewed the data as listed    Component Value Date/Time   NA 140 06/11/2023 0812   K 4.1 06/11/2023 0812   CL 109 06/11/2023 0812   CO2 26  06/11/2023 0812   GLUCOSE 89 06/11/2023 0812   BUN 9 06/11/2023 0812   CREATININE 0.63 06/11/2023 0812   CALCIUM  8.9 06/11/2023 0812   PROT 7.4 06/11/2023 0812   ALBUMIN  4.0 06/11/2023 0812   AST 27 06/11/2023 0812   ALT 16 06/11/2023 0812   ALKPHOS 96 06/11/2023 0812   BILITOT 0.4 06/11/2023 0812   GFRNONAA >60 06/11/2023 0812     Lab Results  Component Value Date   WBC 3.9 (L) 06/11/2023   NEUTROABS 2.0 06/11/2023   HGB 8.2 (L) 06/11/2023   HCT 26.0 (L) 06/11/2023   MCV 80.7 06/11/2023   PLT 186 06/11/2023      RADIOGRAPHIC STUDIES: DG Shoulder 1V Left Result Date: 05/15/2023 CLINICAL DATA:  Left shoulder pain. EXAM: LEFT SHOULDER COMPARISON:  None Available. FINDINGS: There is no evidence of fracture or dislocation. Mild degenerative joint changes of the left acromioclavicular joint and mild fibrocystic degenerative joint changes of the lateral superior left humerus are noted. Soft tissues are unremarkable. IMPRESSION: Mild degenerative joint changes of left shoulder. Electronically Signed   By: Anna Barnes M.D.   On: 05/15/2023 15:12

## 2023-06-11 ENCOUNTER — Inpatient Hospital Stay: Attending: Physician Assistant

## 2023-06-11 ENCOUNTER — Encounter: Payer: Self-pay | Admitting: Nurse Practitioner

## 2023-06-11 ENCOUNTER — Inpatient Hospital Stay (HOSPITAL_BASED_OUTPATIENT_CLINIC_OR_DEPARTMENT_OTHER): Admitting: Nurse Practitioner

## 2023-06-11 ENCOUNTER — Inpatient Hospital Stay

## 2023-06-11 VITALS — BP 150/90 | HR 64 | Temp 97.4°F | Resp 17 | Wt 171.2 lb

## 2023-06-11 VITALS — BP 141/85 | HR 69

## 2023-06-11 DIAGNOSIS — Z95828 Presence of other vascular implants and grafts: Secondary | ICD-10-CM

## 2023-06-11 DIAGNOSIS — R970 Elevated carcinoembryonic antigen [CEA]: Secondary | ICD-10-CM | POA: Insufficient documentation

## 2023-06-11 DIAGNOSIS — G620 Drug-induced polyneuropathy: Secondary | ICD-10-CM | POA: Insufficient documentation

## 2023-06-11 DIAGNOSIS — Z79620 Long term (current) use of immunosuppressive biologic: Secondary | ICD-10-CM | POA: Diagnosis not present

## 2023-06-11 DIAGNOSIS — D6481 Anemia due to antineoplastic chemotherapy: Secondary | ICD-10-CM | POA: Diagnosis not present

## 2023-06-11 DIAGNOSIS — E278 Other specified disorders of adrenal gland: Secondary | ICD-10-CM | POA: Insufficient documentation

## 2023-06-11 DIAGNOSIS — Z5112 Encounter for antineoplastic immunotherapy: Secondary | ICD-10-CM | POA: Insufficient documentation

## 2023-06-11 DIAGNOSIS — Z79899 Other long term (current) drug therapy: Secondary | ICD-10-CM | POA: Insufficient documentation

## 2023-06-11 DIAGNOSIS — T451X5D Adverse effect of antineoplastic and immunosuppressive drugs, subsequent encounter: Secondary | ICD-10-CM | POA: Insufficient documentation

## 2023-06-11 DIAGNOSIS — Z5111 Encounter for antineoplastic chemotherapy: Secondary | ICD-10-CM | POA: Insufficient documentation

## 2023-06-11 DIAGNOSIS — Z7952 Long term (current) use of systemic steroids: Secondary | ICD-10-CM | POA: Diagnosis not present

## 2023-06-11 DIAGNOSIS — C163 Malignant neoplasm of pyloric antrum: Secondary | ICD-10-CM | POA: Insufficient documentation

## 2023-06-11 LAB — CBC WITH DIFFERENTIAL (CANCER CENTER ONLY)
Abs Immature Granulocytes: 0.01 10*3/uL (ref 0.00–0.07)
Basophils Absolute: 0 10*3/uL (ref 0.0–0.1)
Basophils Relative: 1 %
Eosinophils Absolute: 0.1 10*3/uL (ref 0.0–0.5)
Eosinophils Relative: 2 %
HCT: 26 % — ABNORMAL LOW (ref 39.0–52.0)
Hemoglobin: 8.2 g/dL — ABNORMAL LOW (ref 13.0–17.0)
Immature Granulocytes: 0 %
Lymphocytes Relative: 33 %
Lymphs Abs: 1.3 10*3/uL (ref 0.7–4.0)
MCH: 25.5 pg — ABNORMAL LOW (ref 26.0–34.0)
MCHC: 31.5 g/dL (ref 30.0–36.0)
MCV: 80.7 fL (ref 80.0–100.0)
Monocytes Absolute: 0.6 10*3/uL (ref 0.1–1.0)
Monocytes Relative: 14 %
Neutro Abs: 2 10*3/uL (ref 1.7–7.7)
Neutrophils Relative %: 50 %
Platelet Count: 186 10*3/uL (ref 150–400)
RBC: 3.22 MIL/uL — ABNORMAL LOW (ref 4.22–5.81)
RDW: 21.7 % — ABNORMAL HIGH (ref 11.5–15.5)
WBC Count: 3.9 10*3/uL — ABNORMAL LOW (ref 4.0–10.5)
nRBC: 0 % (ref 0.0–0.2)

## 2023-06-11 LAB — CMP (CANCER CENTER ONLY)
ALT: 16 U/L (ref 0–44)
AST: 27 U/L (ref 15–41)
Albumin: 4 g/dL (ref 3.5–5.0)
Alkaline Phosphatase: 96 U/L (ref 38–126)
Anion gap: 5 (ref 5–15)
BUN: 9 mg/dL (ref 6–20)
CO2: 26 mmol/L (ref 22–32)
Calcium: 8.9 mg/dL (ref 8.9–10.3)
Chloride: 109 mmol/L (ref 98–111)
Creatinine: 0.63 mg/dL (ref 0.61–1.24)
GFR, Estimated: >60 mL/min (ref >60)
Glucose, Bld: 89 mg/dL (ref 70–99)
Potassium: 4.1 mmol/L (ref 3.5–5.1)
Sodium: 140 mmol/L (ref 135–145)
Total Bilirubin: 0.4 mg/dL (ref 0.0–1.2)
Total Protein: 7.4 g/dL (ref 6.5–8.1)

## 2023-06-11 LAB — SAMPLE TO BLOOD BANK

## 2023-06-11 LAB — TOTAL PROTEIN, URINE DIPSTICK: Protein, ur: NEGATIVE mg/dL

## 2023-06-11 LAB — CEA (ACCESS): CEA (CHCC): 177.95 ng/mL — ABNORMAL HIGH (ref 0.00–5.00)

## 2023-06-11 MED ORDER — DIPHENHYDRAMINE HCL 50 MG/ML IJ SOLN
25.0000 mg | Freq: Once | INTRAMUSCULAR | Status: AC
  Administered 2023-06-11: 25 mg via INTRAVENOUS
  Filled 2023-06-11: qty 1

## 2023-06-11 MED ORDER — GABAPENTIN 600 MG PO TABS: 1200.0000 mg | ORAL_TABLET | Freq: Three times a day (TID) | ORAL | 1 refills | Status: DC

## 2023-06-11 MED ORDER — SODIUM CHLORIDE 0.9 % IV SOLN
8.0000 mg/kg | Freq: Once | INTRAVENOUS | Status: AC
  Administered 2023-06-11: 600 mg via INTRAVENOUS
  Filled 2023-06-11: qty 10

## 2023-06-11 MED ORDER — HEPARIN SOD (PORK) LOCK FLUSH 100 UNIT/ML IV SOLN
500.0000 [IU] | Freq: Once | INTRAVENOUS | Status: AC | PRN
  Administered 2023-06-11: 500 [IU]

## 2023-06-11 MED ORDER — DICLOFENAC SODIUM 1 % EX GEL: 4.0000 g | Freq: Four times a day (QID) | CUTANEOUS | 3 refills | Status: AC

## 2023-06-11 MED ORDER — SODIUM CHLORIDE 0.9 % IV SOLN
80.0000 mg/m2 | Freq: Once | INTRAVENOUS | Status: AC
  Administered 2023-06-11: 150 mg via INTRAVENOUS
  Filled 2023-06-11: qty 25

## 2023-06-11 MED ORDER — ACETAMINOPHEN 325 MG PO TABS
650.0000 mg | ORAL_TABLET | Freq: Once | ORAL | Status: AC
  Administered 2023-06-11: 650 mg via ORAL
  Filled 2023-06-11: qty 2

## 2023-06-11 MED ORDER — SODIUM CHLORIDE 0.9 % IV SOLN: INTRAVENOUS | Status: DC

## 2023-06-11 MED ORDER — SODIUM CHLORIDE 0.9% FLUSH
10.0000 mL | INTRAVENOUS | Status: DC | PRN
  Administered 2023-06-11: 10 mL

## 2023-06-11 MED ORDER — SODIUM CHLORIDE 0.9% FLUSH
10.0000 mL | Freq: Once | INTRAVENOUS | Status: AC
  Administered 2023-06-11: 10 mL

## 2023-06-11 MED ORDER — SODIUM CHLORIDE 0.9 % IV SOLN
240.0000 mg | Freq: Once | INTRAVENOUS | Status: AC
  Administered 2023-06-11: 240 mg via INTRAVENOUS
  Filled 2023-06-11: qty 24

## 2023-06-11 MED ORDER — FAMOTIDINE IN NACL 20-0.9 MG/50ML-% IV SOLN
20.0000 mg | Freq: Once | INTRAVENOUS | Status: AC
  Administered 2023-06-11: 20 mg via INTRAVENOUS
  Filled 2023-06-11: qty 50

## 2023-06-17 ENCOUNTER — Telehealth: Payer: Self-pay

## 2023-06-17 ENCOUNTER — Other Ambulatory Visit: Payer: Self-pay

## 2023-06-17 NOTE — Telephone Encounter (Signed)
 Pt called stating that he was notified that his upcoming PET Scan appointment has been cancelled d/t pt's insurance has denied approval for the procedure.  Pt wanted to know will he still need to come in on 06/25/2023 if he has not completed his PET Scan.  Informed pt that he would still come in on 06/25/2023 because he has an infusion that day as well.  Stated that Dr. Candise Chambers office will try to do an appeal with the pt's insurance to try to get authorization for the PET.  If the PET is still denied, Dr. Candise Chambers office will order for the pt to have another test that is approved by the pt's insurance.  Pt verbalized understanding and had no further questions or concerns.

## 2023-06-22 ENCOUNTER — Other Ambulatory Visit (HOSPITAL_COMMUNITY)

## 2023-06-24 ENCOUNTER — Other Ambulatory Visit: Payer: Self-pay | Admitting: Nurse Practitioner

## 2023-06-24 DIAGNOSIS — C163 Malignant neoplasm of pyloric antrum: Secondary | ICD-10-CM

## 2023-06-24 NOTE — Assessment & Plan Note (Addendum)
  cT3N3Mx, with hypermetabolic left supraclavicular node, and indeterminate adrenal nodule, MMR normal ypT3ypN3a, liver and nodes recurrence in 07/2022 -diagnosed 10/18/21 by EGD for 6 month f/u of gastric ulcers, showed mucosal intramucosal adenocarcinoma. MMR normal. -baseline CEA 11/01/21 significantly elevated at 1,989.58. -EUS on 11/06/21 by Dr. Burnette, staged as T3 N3 (at least stage III) -staging CT CAP 11/08/21 showed: stable gastric antrum wall thickening; increased size of upper abdominal lymph nodes; stable left adrenal nodule, 1.9 cm. -PET scan showed FDG avid lymph nodes within the gastrohepatic ligament, portacaval region, and mesentery. Tracer avid left supraclavicular lymph node and indeterminate left adrenal gland nodule with mild FDG uptake. -he underwent [place] node biopsy on 1/17 which was negative for malignant cells, but no lymphoid tissue seen on biopsy  -repeated PET on 02/07/22 after 3 months neoadjuvant chemo showed resolved left supraclavicular lymph node, and significant improvement in regional lymph nodes and the primary tumor, stable and indeterminate left adrenal nodule.  -CT adrenal protocol showed the left adrenal nodule is likely benign adenoma, no biopsy is needed. -Completed neoadjvant chemo FLOT s/p 12 cycles 11/22/21 - 04/27/2022 -restaging PET on 05/16/22 showed significantly hypermetabolic activity at the primary gastric cancer in pylorus, and a small hypermetabolic mesenteric lymph node, no other evidence of metastasis. surgeon Dr. Stevie aware of PET findings -S/p distal gastrectomy 05/20/22, path showed ypT3N3a with clear margins, 7/18 + LNs, and lymphovascular invasion identified. There was some treatment effect in a few lymph nodes nut not in the primary tumor -due to rising tumor marker, he underwent PET scan on July 07, 2022, which unfortunately showed hypermetabolic new adenopathy in right hilar and mesentery, diffuse liver metastasis, consistent with metastatic  recurrence.  I personally reviewed the PET scan images with patient -Unfortunately his disease is not curable at this stage. -The goal of chemotherapy is palliative, to prolong his life -I discussed his next generation sequencing foundation one result, which showed EGFR amplification, no other targetable mutations.  His tumor was previously tested for HER2 which was negative (0-1+).  PD-L1 CPS score was 2%, no significant benefit of immunotherapy.  -He started paclitaxel  and ramucirumab  on 07/25/2022  He developed worsening neuropathy quickly, and I had to switch his treatment to FOLFIRI on 08/29/2022, every 2 weeks, he is overall tolerating well. -restaging CT 01/19/2023 showed disease progression with new pathologic right paratracheal mediastinal lymph nodes as well as increasing size of several liver metastases. -His chemo was changed to taxol  and ramucizumab on 02/05/2023, every 2 weeks per his request  -restaging CT 04/08/2023 showed partial response in liver and mediastinal nodes  - 06/11/2023 -patient tolerating current chemotherapy without severe side effects.  Does have persistent neuropathy, but not worsening.  Proceed with cycle 5 day 15 of chemotherapy with paclitaxel , nivolumab , and ramucirumab .  Continue with treatment every 2 weeks.  Restaging PET scan scheduled for 07/03/2023. - 06/25/2023 -today is cycle 6 day 1 of chemotherapy paclitaxel  with nivolumab  and ramucirumab . ##hold ramucirumab  today due to Hgb 7.2. proceed with paclitaxel  and nivolumab .

## 2023-06-24 NOTE — Progress Notes (Unsigned)
 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 MMR normal. -baseline CEA 11/01/21 significantly elevated at 1,989.58. -EUS on 11/06/21 by Jonathan Maynard, staged as T3 N3 (at least stage III) -staging CT CAP 11/08/21 showed: stable gastric antrum wall thickening; increased size of upper abdominal lymph nodes; stable left adrenal nodule, 1.9 cm. -PET scan showed FDG avid lymph nodes within the gastrohepatic ligament, portacaval region, and mesentery. Tracer avid left supraclavicular lymph node and indeterminate left adrenal gland nodule with mild FDG uptake. -he underwent [place] node biopsy on 1/17 which was negative for malignant cells, but no lymphoid tissue seen on biopsy  -repeated PET on 02/07/22 after 3 months neoadjuvant chemo showed resolved left supraclavicular lymph node, and significant improvement in regional lymph nodes and the primary tumor, stable and indeterminate left adrenal nodule.  -CT adrenal protocol showed the left adrenal nodule is likely benign adenoma, no biopsy is needed. -Completed neoadjvant chemo FLOT s/p 12 cycles 11/22/21 - 04/27/2022 -restaging PET on 05/16/22 showed significantly hypermetabolic activity at the primary gastric cancer in pylorus, and a small hypermetabolic mesenteric lymph node, no other evidence of metastasis. surgeon Dr. Dorrie Maynard aware of PET findings -S/p distal gastrectomy 05/20/22, path showed ypT3N3a with clear margins, 7/18 + LNs, and lymphovascular invasion identified. There was some treatment effect in a few lymph  nodes nut not in the primary tumor -due to rising tumor marker, he underwent PET scan on July 07, 2022, which unfortunately showed hypermetabolic new adenopathy in right hilar and mesentery, diffuse liver metastasis, consistent with metastatic recurrence.  I personally reviewed the PET scan images with patient -Unfortunately his disease is not curable at this stage. -The goal of chemotherapy is palliative, to prolong his life -I discussed his next generation sequencing foundation one result, which showed EGFR amplification, no other targetable mutations.  His tumor was previously tested for HER2 which was negative (0-1+).  PD-L1 CPS score was 2%, no significant benefit of immunotherapy.  -He started paclitaxel  and ramucirumab  on 07/25/2022  He developed worsening neuropathy quickly, and I had to switch his treatment to FOLFIRI on 08/29/2022, every 2 weeks, he is overall tolerating well. -restaging CT 01/19/2023 showed disease progression with new pathologic right paratracheal mediastinal lymph nodes as well as increasing size of several liver metastases. -His chemo was changed to taxol  and ramucizumab on 02/05/2023, every 2 weeks per his request  -restaging CT 04/08/2023 showed partial response in liver and mediastinal nodes  - 06/11/2023 -patient tolerating current chemotherapy without severe side effects.  Does have persistent neuropathy, but not worsening.  Proceed with cycle 5 day 15 of chemotherapy with paclitaxel , nivolumab , and ramucirumab .  Continue with treatment every 2 weeks.  Restaging PET scan scheduled for 07/03/2023. - 06/25/2023 -today is cycle 6 day 1 of chemotherapy paclitaxel  with nivolumab  and ramucirumab . ##hold ramucirumab  today due to Hgb 7.2. proceed with paclitaxel  and nivolumab .     Anemia Severe anemia today with Hgb 7.2 and Hct 23.1. will hold ramucirumab  from today's treatment. Patient to receive 1 unit pRBCs on Saturday 06/27/2023. PET scan scheduled for 07/03/2023.   Peripheral  neuropathy Improved manageability  with increased dose gabapentin . He is now taking 1200 mg up to three times daily. He is not experiencing negative side effects.    Plan:  Labs reviewed.  -Severe anemia with Hgb 7.2 and Hct 23.1. --discussed case with Jonathan Maynard. Patient is ok to treat with Hgb 7.2 today. Hold ramucirumab . Proceed with paclitaxel  and nivolumab  at scheduled dose. Patient to receive 1 unit RBCs 06/27/2023 at 8:30 am.  -CMP is unremarkable  -CEA pending -iron  studies drawn due to severe anemia. Results are pending.  Proceed to treatment with paclitaxel  and nivolumab  without any dose adjustments. Hold ramucirumab  from today's treatment.  PET scheduled for 07/03/2023. Labs/flush, follow up, and next treatment as scheduled .   The patient understands the plans discussed today and is in agreement with them.  He knows to contact our office if he develops concerns prior to his next appointment.  I provided 35 minutes of face-to-face time during this encounter and > 50% was spent counseling as documented under my assessment and plan.    Jonathan Deis, NP  [facility] - A DEPT OF [facility] [address] [place] [address] Dept: [phone] Dept Fax: [phone]   Orders Placed This Encounter  Procedures   Iron  and Iron  Binding Capacity ([facility] only)    Standing Status:   Future    Number of Occurrences:   1    Expected Date:   06/25/2023    Expiration Date:   06/24/2024   Ferritin    Standing Status:   Future    Number of Occurrences:   1    Expected Date:   06/25/2023    Expiration Date:   06/24/2024   Informed Consent Details: Physician/Practitioner Attestation; Transcribe to consent form and obtain patient signature    Standing Status:   Future    Expiration Date:   06/24/2024    Physician/Practitioner attestation of informed consent for blood and or blood product transfusion:   I, the  physician/practitioner, attest that I have discussed with the patient the benefits, risks, side effects, alternatives, likelihood of achieving goals and potential problems during recovery for the procedure that I have provided informed consent.    Product(s):   All Product(s)   Care order/instruction    Transfuse Parameters    Standing Status:   Future    Expiration Date:   06/24/2024   Type and screen         Standing Status:   Future    Number of Occurrences:   1    Expiration Date:   06/24/2024      CHIEF COMPLAINT:  CC: Gastric cancer with metastatic disease  Current Treatment: Paclitaxel , nivolumab , and ramucirumab  every 2 weeks  INTERVAL HISTORY:  Jonathan Maynard is here today for repeat clinical assessment.  He was last seen by me on 06/11/2023.  Today, he starts the cycle 6 day 1 of chemotherapy paclitaxel  with nivolumab  and ramucirumab .  He has a PET scan scheduled for 07/03/2023.  Today, he states that his appetite is about the same. He is able to eat when he is hungry. He is having trouble consuming enough liquid. Many drinks are not palatable. He denies abdominal pain. He denies diarrhea or constipation. He states that peripheral neuropathy is about the same, but it is better managed with higher dose of gabapentin . He denies chest pain, chest pressure, or shortness of breath. He denies headaches or visual disturbances.He denies fevers  or chills. He denies pain. His weight has increased 3 pounds over last 2 weeks.  I have reviewed the past medical history, past surgical history, social history and family history with the patient and they are unchanged from previous note.  ALLERGIES:  has no known allergies.  MEDICATIONS:  Current Outpatient Medications  Medication Sig Dispense Refill   acetaminophen  (TYLENOL ) 500 MG tablet Take 1 tablet (500 mg total) by mouth 2 (two) times daily. 60 tablet 3   diclofenac  Sodium (VOLTAREN  ARTHRITIS PAIN) 1 % GEL Apply 4 g topically 4 (four) times  daily. 100 g 3   gabapentin  (NEURONTIN ) 600 MG tablet Take 2 tablets (1,200 mg total) by mouth 3 (three) times daily. 180 tablet 1   magic mouthwash (nystatin , lidocaine , diphenhydrAMINE , alum & mag hydroxide) suspension Swish for 5 seconds and swallow 5 mLs by mouth 3 (three) times daily as needed for mouth pain. 140 mL 0   ondansetron  (ZOFRAN -ODT) 4 MG disintegrating tablet Dissolve 1 tablet (4 mg total) by mouth every 6 (six) hours as needed for nausea or vomiting. 30 tablet 1   pantoprazole  (PROTONIX ) 40 MG tablet Take 1 tablet (40 mg) by mouth 2 times daily. 60 tablet 6   prochlorperazine  (COMPAZINE ) 10 MG tablet Take 1 tablet (10 mg total) by mouth every 6 (six) hours as needed for nausea or vomiting. 30 tablet 2   No current facility-administered medications for this visit.   Facility-Administered Medications Ordered in Other Visits  Medication Dose Route Frequency Provider Last Rate Last Admin   0.9 %  sodium chloride  infusion   Intravenous Continuous Jonathan Johnson, MD 10 mL/hr at 06/25/23 1130 New Bag at 06/25/23 1130   heparin  lock flush 100 unit/mL  500 Units Intracatheter Once PRN Jonathan Mellette, MD       nivolumab  (OPDIVO ) 240 mg in sodium chloride  0.9 % 100 mL chemo infusion  240 mg Intravenous Once Jonathan Buxton, MD 248 mL/hr at 06/25/23 1239 240 mg at 06/25/23 1239   PACLitaxel  (TAXOL ) 150 mg in sodium chloride  0.9 % 250 mL chemo infusion (</= 80mg /m2)  80 mg/m2 (Treatment Plan Recorded) Intravenous Once Jonathan [place], MD       sodium chloride  flush (NS) 0.9 % injection 10 mL  10 mL Intracatheter PRN Jonathan [place], MD        HISTORY OF PRESENT ILLNESS:   Oncology History Overview Note   Cancer Staging  Gastric cancer [facility]) Staging form: Stomach, AJCC 8th Edition - Clinical stage from 10/18/2021: Stage III (cT3, cN3a, cM0) - Signed by Jonathan [place], MD on 11/10/2021     Gastric cancer (HCC)  10/18/2021 Procedure   EGD performed under the care of Dr. Honey Lusty  Findings:  -A large, ulcerated,  partially circumferential (involving two thirds of the lumen circumference) mass with no bleeding and stigmata of recent bleeding was found in the gastric antrum, at the pylorus and in the prepyloric region of the stomach. -Segmental severe inflammation characterized by congestion (edema) and erythema was found in the gastric antrum.   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 Negative for dysplasia.  Negative for helicobacter pylori  Mismatch Repair (  MMR) Protein Imunohistochemistry (IHC): IHC Expression Result: MLH1: Preserved nuclear expression. MSH2: Preserved nuclear expression. MSH6: Preserved nuclear expression. PMS2: Preserved nuclear expression. Interpretation: NORMAL   10/31/2021 Initial Diagnosis   Gastric cancer (HCC)   11/01/2021 Tumor Marker   CEA = 6,144.31 (^)   11/06/2021 Procedure   Upper EUS, Jonathan Maynard  Impression: - Normal esophagus. - A small amount of food (residue) in the stomach. - Congested, friable (with contact bleeding) and ulcerated mucosa in the prepyloric region of the stomach. - Normal examined duodenum. - There was no evidence of significant pathology in the left lobe of the liver. - Many abnormal lymph nodes (over 10) were visualized in the gastrohepatic ligament (level 18), celiac region (level 20), perigastric region, peripancreatic region and aortocaval region. - Wall thickening was seen in the prepyloric region of the stomach. The thickening appeared to be primarily within the deep mucosa (Layer 2) but extended through the  muscularis propria. - Overall constellation of findings consistent with at least T3 N3 Mx (at least stage III) gastric adenocarcinoma.   11/08/2021 Imaging   EXAM: CT CHEST, ABDOMEN, AND PELVIS WITH CONTRAST  IMPRESSION: 1. Similar irregular wall thickening of the gastric antrum, consistent with patient's known primary gastric neoplasm. 2. Increased size of the upper abdominal lymph nodes, concerning for worsening nodal disease involvement. 3. Stable left adrenal nodule common nonspecific possibly a metastatic lesion or adenoma. 4. No evidence of metastatic disease in the chest. 5. Left-sided colonic diverticulosis without findings of acute diverticulitis. 6. Enlarged prostate gland. 7.  Aortic Atherosclerosis (ICD10-I70.0).   11/22/2021 - 04/26/2022 Chemotherapy   Patient is on Treatment Plan : GASTROESOPHAGEAL FLOT q14d X 4 cycles     11/27/2021 Imaging    IMPRESSION: 1. The mass within the gastric antrum is mildly decreased in size when compared with 11/08/2021. There is persistent FDG uptake associated with this mass compatible with residual tumor. 2. Persistent FDG avid lymph nodes within the gastrohepatic ligament, portacaval region, and mesentery. These are mildly decreased in size when compared with 11/08/2021. 3. Tracer avid left supraclavicular lymph node is also mildly decreased in size when compared with 11/08/2021. 4. Indeterminate left adrenal gland nodule with mild FDG uptake. This may represent a lipid poor adenoma. Metastatic disease not exclude. 5. Diffuse increased uptake throughout the bone marrow is favored to represent treatment related change. 6.  Aortic Atherosclerosis (ICD10-I70.0).     02/07/2022 Imaging    IMPRESSION: 1. No substantial change in hypermetabolism associated with the distal gastric lesion. Although difficult to discern on noncontrast CT imaging, the soft tissue component appears to be decreased since the prior PET-CT. 2. Interval  resolution of the hypermetabolic left supraclavicular lymph node. 3. Interval marked decrease of the hypermetabolic gastrohepatic ligament lymphadenopathy. Similar decrease in size and hypermetabolism associated with index nodes identified previously in the 4. porta hepatis and hypogastric region. 5. Stable left adrenal nodule with slight hypermetabolism. Nonspecific finding could represent lipid poor adenoma or metastatic deposit. 6. No new suspicious hypermetabolic disease on today's study. 7.  Aortic Atherosclerosis (ICD10-I70.0).   07/25/2022 - 08/15/2022 Chemotherapy   Patient is on Treatment Plan : GASTROESOPHAGEAL Ramucirumab  D1, 15 + Paclitaxel  D1,8,15 q28d     08/29/2022 - 01/24/2023 Chemotherapy   Patient is on Treatment Plan : COLORECTAL FOLFIRI q14d     02/05/2023 -  Chemotherapy   Patient is on Treatment Plan : GASTROESOPHAGEAL Ramucirumab  D1, 15 + Paclitaxel  D1,8,15 q28d         REVIEW OF SYSTEMS:  Constitutional: Denies fevers, chills or abnormal weight loss Eyes: Denies blurriness of vision Ears, nose, mouth, throat, and face: Denies mucositis or sore throat Respiratory: Denies cough, dyspnea or wheezes Cardiovascular: Denies palpitation, chest discomfort or lower extremity swelling Gastrointestinal:  Denies nausea, heartburn or change in bowel habits Skin: Denies abnormal skin rashes Lymphatics: Denies new lymphadenopathy or easy bruising Neurological:Denies numbness, tingling or new weaknesses Behavioral/Psych: Mood is stable, no new changes  All other systems were reviewed with the patient and are negative.   VITALS:   Today's Vitals   06/25/23 0929 06/25/23 0931  BP: (!) 140/72 (!) 140/70  Pulse: 67   Resp: 17   Temp: 98 F (36.7 C)   TempSrc: Temporal   SpO2: 99%   Weight: 174 lb 6.4 oz (79.1 kg)   Height: 5' 5 (1.651 m)   PainSc: 0-No pain    Body mass index is 29.02 kg/m.   Wt Readings from Last 3 Encounters:  06/25/23 174 lb 6.4 oz (79.1  kg)  06/11/23 171 lb 3.2 oz (77.7 kg)  05/28/23 170 lb 8 oz (77.3 kg)    Body mass index is 29.02 kg/m.  Performance status (ECOG): 1 - Symptomatic but completely ambulatory  PHYSICAL EXAM:   GENERAL:alert, no distress and comfortable SKIN: skin color, texture, turgor are normal, no rashes or significant lesions EYES: normal, Conjunctiva are pink and non-injected, sclera clear OROPHARYNX:no exudate, no erythema and lips, buccal mucosa, and tongue normal  NECK: supple, thyroid  normal size, non-tender, without nodularity LYMPH:  no palpable lymphadenopathy in the cervical, axillary or inguinal LUNGS: clear to auscultation and percussion with normal breathing effort HEART: regular rate & rhythm and no murmurs and no lower extremity edema ABDOMEN:abdomen soft, non-tender and normal bowel sounds Musculoskeletal:no cyanosis of digits and no clubbing  NEURO: alert & oriented x 3 with fluent speech, no focal motor/sensory deficits  LABORATORY DATA:  I have reviewed the data as listed    Component Value Date/Time   NA 143 06/25/2023 0908   K 4.0 06/25/2023 0908   CL 113 (H) 06/25/2023 0908   CO2 27 06/25/2023 0908   GLUCOSE 111 (H) 06/25/2023 0908   BUN 7 06/25/2023 0908   CREATININE 0.71 06/25/2023 0908   CALCIUM  8.5 (L) 06/25/2023 0908   PROT 6.6 06/25/2023 0908   ALBUMIN  3.7 06/25/2023 0908   AST 20 06/25/2023 0908   ALT 10 06/25/2023 0908   ALKPHOS 97 06/25/2023 0908   BILITOT 0.4 06/25/2023 0908   GFRNONAA >60 06/25/2023 0908    Lab Results  Component Value Date   WBC 4.3 06/25/2023   NEUTROABS 2.3 06/25/2023   HGB 7.2 (L) 06/25/2023   HCT 23.1 (L) 06/25/2023   MCV 81.6 06/25/2023   PLT 198 06/25/2023

## 2023-06-25 ENCOUNTER — Inpatient Hospital Stay

## 2023-06-25 ENCOUNTER — Inpatient Hospital Stay (HOSPITAL_BASED_OUTPATIENT_CLINIC_OR_DEPARTMENT_OTHER): Admitting: Nurse Practitioner

## 2023-06-25 ENCOUNTER — Encounter: Payer: Self-pay | Admitting: Nurse Practitioner

## 2023-06-25 VITALS — BP 140/70 | HR 67 | Temp 98.0°F | Resp 17 | Ht 65.0 in | Wt 174.4 lb

## 2023-06-25 DIAGNOSIS — Z5112 Encounter for antineoplastic immunotherapy: Secondary | ICD-10-CM | POA: Diagnosis not present

## 2023-06-25 DIAGNOSIS — C163 Malignant neoplasm of pyloric antrum: Secondary | ICD-10-CM

## 2023-06-25 DIAGNOSIS — Z95828 Presence of other vascular implants and grafts: Secondary | ICD-10-CM

## 2023-06-25 LAB — CMP (CANCER CENTER ONLY)
ALT: 10 U/L (ref 0–44)
AST: 20 U/L (ref 15–41)
Albumin: 3.7 g/dL (ref 3.5–5.0)
Alkaline Phosphatase: 97 U/L (ref 38–126)
Anion gap: 3 — ABNORMAL LOW (ref 5–15)
BUN: 7 mg/dL (ref 6–20)
CO2: 27 mmol/L (ref 22–32)
Calcium: 8.5 mg/dL — ABNORMAL LOW (ref 8.9–10.3)
Chloride: 113 mmol/L — ABNORMAL HIGH (ref 98–111)
Creatinine: 0.71 mg/dL (ref 0.61–1.24)
GFR, Estimated: >60 mL/min (ref >60)
Glucose, Bld: 111 mg/dL — ABNORMAL HIGH (ref 70–99)
Potassium: 4 mmol/L (ref 3.5–5.1)
Sodium: 143 mmol/L (ref 135–145)
Total Bilirubin: 0.4 mg/dL (ref 0.0–1.2)
Total Protein: 6.6 g/dL (ref 6.5–8.1)

## 2023-06-25 LAB — IRON AND IRON BINDING CAPACITY (CC-WL,HP ONLY)
Iron: 48 ug/dL (ref 45–182)
Saturation Ratios: 15 % — ABNORMAL LOW (ref 17.9–39.5)
TIBC: 321 ug/dL (ref 250–450)
UIBC: 273 ug/dL (ref 117–376)

## 2023-06-25 LAB — CEA (ACCESS): CEA (CHCC): 231.08 ng/mL — ABNORMAL HIGH (ref 0.00–5.00)

## 2023-06-25 LAB — TSH: TSH: 1.13 u[IU]/mL (ref 0.350–4.500)

## 2023-06-25 LAB — CBC WITH DIFFERENTIAL (CANCER CENTER ONLY)
Abs Immature Granulocytes: 0.01 10*3/uL (ref 0.00–0.07)
Basophils Absolute: 0 10*3/uL (ref 0.0–0.1)
Basophils Relative: 1 %
Eosinophils Absolute: 0.1 10*3/uL (ref 0.0–0.5)
Eosinophils Relative: 2 %
HCT: 23.1 % — ABNORMAL LOW (ref 39.0–52.0)
Hemoglobin: 7.2 g/dL — ABNORMAL LOW (ref 13.0–17.0)
Immature Granulocytes: 0 %
Lymphocytes Relative: 32 %
Lymphs Abs: 1.4 10*3/uL (ref 0.7–4.0)
MCH: 25.4 pg — ABNORMAL LOW (ref 26.0–34.0)
MCHC: 31.2 g/dL (ref 30.0–36.0)
MCV: 81.6 fL (ref 80.0–100.0)
Monocytes Absolute: 0.5 10*3/uL (ref 0.1–1.0)
Monocytes Relative: 11 %
Neutro Abs: 2.3 10*3/uL (ref 1.7–7.7)
Neutrophils Relative %: 54 %
Platelet Count: 198 10*3/uL (ref 150–400)
RBC: 2.83 MIL/uL — ABNORMAL LOW (ref 4.22–5.81)
RDW: 23.2 % — ABNORMAL HIGH (ref 11.5–15.5)
WBC Count: 4.3 10*3/uL (ref 4.0–10.5)
nRBC: 0 % (ref 0.0–0.2)

## 2023-06-25 LAB — FERRITIN: Ferritin: 38 ng/mL (ref 24–336)

## 2023-06-25 LAB — PREPARE RBC (CROSSMATCH)

## 2023-06-25 LAB — TOTAL PROTEIN, URINE DIPSTICK: Protein, ur: 30 mg/dL — AB

## 2023-06-25 MED ORDER — DIPHENHYDRAMINE HCL 50 MG/ML IJ SOLN
25.0000 mg | Freq: Once | INTRAMUSCULAR | Status: AC
  Administered 2023-06-25: 25 mg via INTRAVENOUS
  Filled 2023-06-25: qty 1

## 2023-06-25 MED ORDER — SODIUM CHLORIDE 0.9 % IV SOLN
INTRAVENOUS | Status: DC
Start: 2023-06-25 — End: 2023-06-25

## 2023-06-25 MED ORDER — FAMOTIDINE IN NACL 20-0.9 MG/50ML-% IV SOLN
20.0000 mg | Freq: Once | INTRAVENOUS | Status: AC
  Administered 2023-06-25: 20 mg via INTRAVENOUS
  Filled 2023-06-25: qty 50

## 2023-06-25 MED ORDER — ACETAMINOPHEN 325 MG PO TABS
650.0000 mg | ORAL_TABLET | Freq: Once | ORAL | Status: AC
  Administered 2023-06-25: 650 mg via ORAL
  Filled 2023-06-25: qty 2

## 2023-06-25 MED ORDER — SODIUM CHLORIDE 0.9 % IV SOLN
240.0000 mg | Freq: Once | INTRAVENOUS | Status: AC
  Administered 2023-06-25: 240 mg via INTRAVENOUS
  Filled 2023-06-25: qty 24

## 2023-06-25 MED ORDER — SODIUM CHLORIDE 0.9 % IV SOLN
80.0000 mg/m2 | Freq: Once | INTRAVENOUS | Status: AC
  Administered 2023-06-25: 150 mg via INTRAVENOUS
  Filled 2023-06-25: qty 25

## 2023-06-25 MED ORDER — SODIUM CHLORIDE 0.9% FLUSH
10.0000 mL | Freq: Once | INTRAVENOUS | Status: AC
  Administered 2023-06-25: 10 mL

## 2023-06-25 MED ORDER — SODIUM CHLORIDE 0.9% FLUSH
10.0000 mL | INTRAVENOUS | Status: DC | PRN
  Administered 2023-06-25: 10 mL

## 2023-06-25 MED ORDER — HEPARIN SOD (PORK) LOCK FLUSH 100 UNIT/ML IV SOLN
500.0000 [IU] | Freq: Once | INTRAVENOUS | Status: AC | PRN
  Administered 2023-06-25: 500 [IU]

## 2023-06-25 MED ORDER — SODIUM CHLORIDE 0.9 % IV SOLN: 80.0000 mg/m2 | Freq: Once | INTRAVENOUS | Status: DC

## 2023-06-25 NOTE — Patient Instructions (Signed)

## 2023-06-25 NOTE — Addendum Note (Signed)
 Addended by: Kayin Osment N on: 06/25/2023 04:50 PM   Modules accepted: Orders

## 2023-06-25 NOTE — Progress Notes (Signed)
 Per Pasam MD, hold ramcirumab today. Pt is scheduled to receive blood transfusion Saturday.

## 2023-06-25 NOTE — Patient Instructions (Signed)
 [facility] - A DEPT OF [facility]  Discharge Instructions: Thank you for choosing [facility] to provide your oncology and hematology care.   If you have a lab appointment with the Cancer Center, please go directly to the Cancer Center and check in at the registration area.   Wear comfortable clothing and clothing appropriate for easy access to any Portacath or PICC line.   We strive to give you quality time with your provider. You may need to reschedule your appointment if you arrive late (15 or more minutes).  Arriving late affects you and other patients whose appointments are after yours.  Also, if you miss three or more appointments without notifying the office, you may be dismissed from the clinic at the provider's discretion.      For prescription refill requests, have your pharmacy contact our office and allow 72 hours for refills to be completed.    Today you received the following chemotherapy and/or immunotherapy agents: Opdivo , Paclitaxel .       To help prevent nausea and vomiting after your treatment, we encourage you to take your nausea medication as directed.  BELOW ARE SYMPTOMS THAT SHOULD BE REPORTED IMMEDIATELY: *FEVER GREATER THAN 100.4 F (38 C) OR HIGHER *CHILLS OR SWEATING *NAUSEA AND VOMITING THAT IS NOT CONTROLLED WITH YOUR NAUSEA MEDICATION *UNUSUAL SHORTNESS OF BREATH *UNUSUAL BRUISING OR BLEEDING *URINARY PROBLEMS (pain or burning when urinating, or frequent urination) *BOWEL PROBLEMS (unusual diarrhea, constipation, pain near the anus) TENDERNESS IN MOUTH AND THROAT WITH OR WITHOUT PRESENCE OF ULCERS (sore throat, sores in mouth, or a toothache) UNUSUAL RASH, SWELLING OR PAIN  UNUSUAL VAGINAL DISCHARGE OR ITCHING   Items with * indicate a potential emergency and should be followed up as soon as possible or go to the Emergency Department if any problems should occur.  Please show the CHEMOTHERAPY ALERT CARD or  IMMUNOTHERAPY ALERT CARD at check-in to the Emergency Department and triage nurse.  Should you have questions after your visit or need to cancel or reschedule your appointment, please contact [facility] - A DEPT OF [facility]  Dept: [phone]  and follow the prompts.  Office hours are 8:00 a.m. to 4:30 p.m. Monday - Friday. Please note that voicemails left after 4:00 p.m. may not be returned until the following business day.  We are closed weekends and major holidays. You have access to a nurse at all times for urgent questions. Please call the main number to the clinic Dept: [phone] and follow the prompts.   For any non-urgent questions, you may also contact your provider using MyChart. We now offer e-Visits for anyone 67 and older to request care online for non-urgent symptoms. For details visit [email or web address].   Also download the MyChart app! Go to the app store, search MyChart, open the app, select [place], and log in with your MyChart username and password.

## 2023-06-26 LAB — T4: T4, Total: 7 ug/dL (ref 4.5–12.0)

## 2023-06-27 ENCOUNTER — Inpatient Hospital Stay

## 2023-06-27 VITALS — BP 167/91 | HR 70 | Temp 98.5°F | Resp 18

## 2023-06-27 DIAGNOSIS — Z95828 Presence of other vascular implants and grafts: Secondary | ICD-10-CM

## 2023-06-27 DIAGNOSIS — Z5112 Encounter for antineoplastic immunotherapy: Secondary | ICD-10-CM | POA: Diagnosis not present

## 2023-06-27 DIAGNOSIS — C163 Malignant neoplasm of pyloric antrum: Secondary | ICD-10-CM

## 2023-06-27 MED ORDER — SODIUM CHLORIDE 0.9% FLUSH
10.0000 mL | Freq: Once | INTRAVENOUS | Status: AC
  Administered 2023-06-27: 10 mL

## 2023-06-27 MED ORDER — HEPARIN SOD (PORK) LOCK FLUSH 100 UNIT/ML IV SOLN: 500.0000 [IU] | Freq: Every day | INTRAVENOUS | Status: DC | PRN

## 2023-06-27 MED ORDER — SODIUM CHLORIDE 0.9% IV SOLUTION
250.0000 mL | INTRAVENOUS | Status: DC
  Administered 2023-06-27: 100 mL via INTRAVENOUS

## 2023-06-27 MED ORDER — SODIUM CHLORIDE 0.9% FLUSH: 10.0000 mL | INTRAVENOUS | Status: DC | PRN

## 2023-06-27 NOTE — Patient Instructions (Signed)
 Blood Transfusion, Adult A blood transfusion is a procedure in which you receive blood or a type of blood cell (blood component) through an IV. You may need a blood transfusion when you have a low blood count, which is a low number of any blood cell. This may result from a bleeding disorder, illness, injury, or surgery. The blood may come from a donor, or you may be able to have your own blood collected and stored (autologous blood donation) before a planned surgery. The blood given in a transfusion may be made up of different blood components. You may receive: Red blood cells. These carry oxygen to the cells in the body. Platelets. These help your blood to clot. Plasma. This is the liquid part of your blood. It carries proteins and other substances throughout the body. White blood cells. These help you fight infections. If you have hemophilia or another clotting disorder, you may also receive other types of blood products. Depending on the type of blood product, this procedure may take 1-4 hours to complete. Tell a health care provider about: Any bleeding problems you have. Any previous reactions you have had during a blood transfusion. Any allergies you have. All medicines you are taking, including vitamins, herbs, eye drops, creams, and over-the-counter medicines. Any surgeries you have had. Any medical conditions you have. Whether you are pregnant or may be pregnant. What are the risks? Talk with your health care provider about risks. The most common problems include: A mild allergic reaction, such as red, swollen areas of skin (hives) and itching. Fever or chills. This may be the body's response to new blood cells received. This may occur during or up to 4 hours after the transfusion. More serious problems may include: A serious allergic reaction that causes difficulty breathing or swelling around the face and lips. Transfusion-associated circulatory overload (TACO), or too much fluid in  the lungs. This may cause breathing problems. Transfusion-related acute lung injury (TRALI), which causes breathing difficulty and low oxygen in the blood. This can occur within hours of the transfusion or several days later. Iron overload. This can happen after receiving many blood transfusions over a period of time. Infection or virus being transmitted. This is rare because donated blood is carefully tested before it is given. Hemolytic transfusion reaction. This is rare. It happens when the body's defense system (immune system)tries to attack the new blood cells. Symptoms may include fever, chills, nausea, low blood pressure, and low back or chest pain. Transfusion-associated graft-versus-host disease (TAGVHD). This is rare. It happens when donated cells attack the body's healthy tissues. What happens before the procedure? You will have a blood test to check your blood type. This test is done to know what kind of blood your body will accept and to match it to the donor blood. If you are going to have a planned surgery, you may be able to do an autologous blood donation. This may be done in case you need to have a transfusion. You will have your temperature, blood pressure, and pulse checked before the transfusion. If you have had an allergic reaction to a transfusion in the past, you may be given medicine to help prevent a reaction. This medicine may be given to you by mouth (orally) or through an IV. What happens during the procedure?  An IV will be inserted into one of your veins. The bag of blood will be attached to your IV. The blood will then enter through your vein. Your temperature, blood pressure,  and pulse will be monitored during the transfusion. This monitoring is done to detect early signs of a transfusion reaction. Tell your nurse right away if you have any of these symptoms during the transfusion: Shortness of breath or trouble breathing. Chest or back pain. Fever or  chills. Itching or hives. If you have any signs or symptoms of a reaction, your transfusion will be stopped and you may be given medicine. When the transfusion is complete, your IV will be removed. Pressure may be applied to the IV site for a few minutes. A bandage (dressing)will be applied. The procedure may vary among health care providers and hospitals. What happens after the procedure? Your temperature, blood pressure, pulse, breathing rate, and blood oxygen level will be monitored until you leave the hospital or clinic. Your blood may be tested to see how you have responded to the transfusion. You may be warmed with fluids or blankets to maintain a normal body temperature. If you receive your blood transfusion in an outpatient setting, you will be told whom to contact to report any reactions. Where to find more information Visit the [employer]: redcross.org Summary A blood transfusion is a procedure in which you receive blood or a type of blood cell (blood component) through an IV. The blood given in a transfusion may be made up of different blood components. You may receive red blood cells, platelets, plasma, or white blood cells depending on the condition treated. Your temperature, blood pressure, and pulse will be monitored before, during, and after the transfusion. After the transfusion, your blood may be tested to see how your body has responded. This information is not intended to replace advice given to you by your health care provider. Make sure you discuss any questions you have with your health care provider. Document Revised: 03/22/2021 Document Reviewed: 03/22/2021 Elsevier Patient Education  2024 [employer].

## 2023-06-29 LAB — TYPE AND SCREEN
ABO/RH(D): O POS
Antibody Screen: NEGATIVE
Unit division: 0

## 2023-06-29 LAB — BPAM RBC
Blood Product Expiration Date: 202506242359
ISSUE DATE / TIME: 202506210919
Unit Type and Rh: 5100

## 2023-07-03 ENCOUNTER — Other Ambulatory Visit: Payer: Self-pay

## 2023-07-03 ENCOUNTER — Encounter (HOSPITAL_COMMUNITY): Admission: RE | Admit: 2023-07-03 | Source: Ambulatory Visit

## 2023-07-03 ENCOUNTER — Telehealth: Payer: Self-pay

## 2023-07-03 ENCOUNTER — Encounter (HOSPITAL_COMMUNITY): Payer: Self-pay
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
## 2023-07-08 ENCOUNTER — Ambulatory Visit

## 2023-07-08 ENCOUNTER — Other Ambulatory Visit

## 2023-07-08 ENCOUNTER — Ambulatory Visit: Admitting: Hematology

## 2023-07-08 NOTE — Assessment & Plan Note (Signed)
  cT3N3Mx, with hypermetabolic left supraclavicular node, and indeterminate adrenal nodule, MMR normal ypT3ypN3a, liver and nodes recurrence in 07/2022 -diagnosed 10/18/21 by EGD for 6 month f/u of gastric ulcers, showed mucosal intramucosal adenocarcinoma. MMR normal. -baseline CEA 11/01/21 significantly elevated at 1,989.58. -EUS on 11/06/21 by Dr. Dulce Sellar, staged as T3 N3 (at least stage III) -staging CT CAP 11/08/21 showed: stable gastric antrum wall thickening; increased size of upper abdominal lymph nodes; stable left adrenal nodule, 1.9 cm. -PET scan showed FDG avid lymph nodes within the gastrohepatic ligament, portacaval region, and mesentery. Tracer avid left supraclavicular lymph node and indeterminate left adrenal gland nodule with mild FDG uptake. -he underwent [place] node biopsy on 1/17 which was negative for malignant cells, but no lymphoid tissue seen on biopsy  -repeated PET on 02/07/22 after 3 months neoadjuvant chemo showed resolved left supraclavicular lymph node, and significant improvement in regional lymph nodes and the primary tumor, stable and indeterminate left adrenal nodule.  -CT adrenal protocol showed the left adrenal nodule is likely benign adenoma, no biopsy is needed. -Completed neoadjvant chemo FLOT s/p 12 cycles 11/22/21 - 04/27/2022 -restaging PET on 05/16/22 showed significantly hypermetabolic activity at the primary gastric cancer in pylorus, and a small hypermetabolic mesenteric lymph node, no other evidence of metastasis. surgeon Dr. Sheliah Hatch aware of PET findings -S/p distal gastrectomy 05/20/22, path showed ypT3N3a with clear margins, 7/18 + LNs, and lymphovascular invasion identified. There was some treatment effect in a few lymph nodes nut not in the primary tumor -due to rising tumor marker, he underwent PET scan on July 07, 2022, which unfortunately showed hypermetabolic new adenopathy in right hilar and mesentery, diffuse liver metastasis, consistent with metastatic  recurrence.  I personally reviewed the PET scan images with patient -Unfortunately his disease is not curable at this stage. -The goal of chemotherapy is palliative, to prolong his life -I discussed his next generation sequencing foundation one result, which showed EGFR amplification, no other targetable mutations.  His tumor was previously tested for HER2 which was negative (0-1+).  PD-L1 CPS score was 2%, no significant benefit of immunotherapy.  -He started paclitaxel and ramucirumab on 07/25/2022  He developed worsening neuropathy quickly, and I had to switch his treatment to FOLFIRI on 08/29/2022, every 2 weeks, he is overall tolerating well. -restaging CT 01/19/2023 showed disease progression with new pathologic right paratracheal mediastinal lymph nodes as well as increasing size of several liver metastases. -I changed his chemo to taxol and ramucizumab on 02/05/2023, every 2 weeks per his request  -restaging CT 04/08/2023 showed partial response in liver and mediastinal nodes

## 2023-07-09 ENCOUNTER — Inpatient Hospital Stay

## 2023-07-09 ENCOUNTER — Encounter: Payer: Self-pay | Admitting: Hematology

## 2023-07-09 ENCOUNTER — Inpatient Hospital Stay: Attending: Physician Assistant

## 2023-07-09 ENCOUNTER — Inpatient Hospital Stay (HOSPITAL_BASED_OUTPATIENT_CLINIC_OR_DEPARTMENT_OTHER): Admitting: Hematology

## 2023-07-09 VITALS — BP 150/82 | HR 69 | Temp 98.8°F | Resp 16 | Ht 65.0 in | Wt 169.7 lb

## 2023-07-09 DIAGNOSIS — Z5112 Encounter for antineoplastic immunotherapy: Secondary | ICD-10-CM | POA: Insufficient documentation

## 2023-07-09 DIAGNOSIS — Z79899 Other long term (current) drug therapy: Secondary | ICD-10-CM | POA: Insufficient documentation

## 2023-07-09 DIAGNOSIS — Z5111 Encounter for antineoplastic chemotherapy: Secondary | ICD-10-CM | POA: Insufficient documentation

## 2023-07-09 DIAGNOSIS — C163 Malignant neoplasm of pyloric antrum: Secondary | ICD-10-CM | POA: Diagnosis present

## 2023-07-09 DIAGNOSIS — G620 Drug-induced polyneuropathy: Secondary | ICD-10-CM | POA: Diagnosis not present

## 2023-07-09 DIAGNOSIS — Z79620 Long term (current) use of immunosuppressive biologic: Secondary | ICD-10-CM | POA: Insufficient documentation

## 2023-07-09 DIAGNOSIS — D6481 Anemia due to antineoplastic chemotherapy: Secondary | ICD-10-CM | POA: Insufficient documentation

## 2023-07-09 DIAGNOSIS — Z7952 Long term (current) use of systemic steroids: Secondary | ICD-10-CM | POA: Diagnosis not present

## 2023-07-09 DIAGNOSIS — Z95828 Presence of other vascular implants and grafts: Secondary | ICD-10-CM

## 2023-07-09 DIAGNOSIS — E278 Other specified disorders of adrenal gland: Secondary | ICD-10-CM | POA: Insufficient documentation

## 2023-07-09 DIAGNOSIS — Z452 Encounter for adjustment and management of vascular access device: Secondary | ICD-10-CM | POA: Diagnosis not present

## 2023-07-09 DIAGNOSIS — R5383 Other fatigue: Secondary | ICD-10-CM | POA: Insufficient documentation

## 2023-07-09 DIAGNOSIS — T451X5D Adverse effect of antineoplastic and immunosuppressive drugs, subsequent encounter: Secondary | ICD-10-CM | POA: Insufficient documentation

## 2023-07-09 LAB — CMP (CANCER CENTER ONLY)
ALT: 10 U/L (ref 0–44)
AST: 21 U/L (ref 15–41)
Albumin: 3.8 g/dL (ref 3.5–5.0)
Alkaline Phosphatase: 91 U/L (ref 38–126)
Anion gap: 3 — ABNORMAL LOW (ref 5–15)
BUN: 8 mg/dL (ref 6–20)
CO2: 28 mmol/L (ref 22–32)
Calcium: 8.7 mg/dL — ABNORMAL LOW (ref 8.9–10.3)
Chloride: 110 mmol/L (ref 98–111)
Creatinine: 0.68 mg/dL (ref 0.61–1.24)
GFR, Estimated: >60 mL/min (ref >60)
Glucose, Bld: 103 mg/dL — ABNORMAL HIGH (ref 70–99)
Potassium: 4 mmol/L (ref 3.5–5.1)
Sodium: 141 mmol/L (ref 135–145)
Total Bilirubin: 0.5 mg/dL (ref 0.0–1.2)
Total Protein: 6.8 g/dL (ref 6.5–8.1)

## 2023-07-09 LAB — CBC WITH DIFFERENTIAL (CANCER CENTER ONLY)
Abs Immature Granulocytes: 0.01 10*3/uL (ref 0.00–0.07)
Basophils Absolute: 0 10*3/uL (ref 0.0–0.1)
Basophils Relative: 1 %
Eosinophils Absolute: 0.1 10*3/uL (ref 0.0–0.5)
Eosinophils Relative: 2 %
HCT: 25.1 % — ABNORMAL LOW (ref 39.0–52.0)
Hemoglobin: 7.9 g/dL — ABNORMAL LOW (ref 13.0–17.0)
Immature Granulocytes: 0 %
Lymphocytes Relative: 29 %
Lymphs Abs: 1.3 10*3/uL (ref 0.7–4.0)
MCH: 25.7 pg — ABNORMAL LOW (ref 26.0–34.0)
MCHC: 31.5 g/dL (ref 30.0–36.0)
MCV: 81.8 fL (ref 80.0–100.0)
Monocytes Absolute: 0.5 10*3/uL (ref 0.1–1.0)
Monocytes Relative: 11 %
Neutro Abs: 2.7 10*3/uL (ref 1.7–7.7)
Neutrophils Relative %: 57 %
Platelet Count: 169 10*3/uL (ref 150–400)
RBC: 3.07 MIL/uL — ABNORMAL LOW (ref 4.22–5.81)
RDW: 22.7 % — ABNORMAL HIGH (ref 11.5–15.5)
WBC Count: 4.6 10*3/uL (ref 4.0–10.5)
nRBC: 0 % (ref 0.0–0.2)

## 2023-07-09 LAB — CEA (ACCESS): CEA (CHCC): 402.4 ng/mL — ABNORMAL HIGH (ref 0.00–5.00)

## 2023-07-09 LAB — TOTAL PROTEIN, URINE DIPSTICK: Protein, ur: 30 mg/dL — AB

## 2023-07-09 MED ORDER — SODIUM CHLORIDE 0.9% FLUSH
10.0000 mL | INTRAVENOUS | Status: DC | PRN
Start: 2023-07-09 — End: 2023-07-09
  Administered 2023-07-09: 10 mL

## 2023-07-09 MED ORDER — DIPHENHYDRAMINE HCL 50 MG/ML IJ SOLN
25.0000 mg | Freq: Once | INTRAMUSCULAR | Status: AC
  Administered 2023-07-09: 25 mg via INTRAVENOUS
  Filled 2023-07-09: qty 1

## 2023-07-09 MED ORDER — SODIUM CHLORIDE 0.9 % IV SOLN
240.0000 mg | Freq: Once | INTRAVENOUS | Status: AC
  Administered 2023-07-09: 240 mg via INTRAVENOUS
  Filled 2023-07-09: qty 24

## 2023-07-09 MED ORDER — ACETAMINOPHEN 325 MG PO TABS
650.0000 mg | ORAL_TABLET | Freq: Once | ORAL | Status: AC
  Administered 2023-07-09: 650 mg via ORAL
  Filled 2023-07-09: qty 2

## 2023-07-09 MED ORDER — SODIUM CHLORIDE 0.9 % IV SOLN
80.0000 mg/m2 | Freq: Once | INTRAVENOUS | Status: AC
  Administered 2023-07-09: 150 mg via INTRAVENOUS
  Filled 2023-07-09: qty 25

## 2023-07-09 MED ORDER — SODIUM CHLORIDE 0.9% FLUSH
10.0000 mL | Freq: Once | INTRAVENOUS | Status: AC
  Administered 2023-07-09: 10 mL

## 2023-07-09 MED ORDER — SODIUM CHLORIDE 0.9 % IV SOLN
INTRAVENOUS | Status: DC
Start: 2023-07-09 — End: 2023-07-09

## 2023-07-09 MED ORDER — HEPARIN SOD (PORK) LOCK FLUSH 100 UNIT/ML IV SOLN
500.0000 [IU] | Freq: Once | INTRAVENOUS | Status: AC | PRN
  Administered 2023-07-09: 500 [IU]

## 2023-07-09 MED ORDER — FAMOTIDINE IN NACL 20-0.9 MG/50ML-% IV SOLN
20.0000 mg | Freq: Once | INTRAVENOUS | Status: AC
  Administered 2023-07-09: 20 mg via INTRAVENOUS
  Filled 2023-07-09: qty 50

## 2023-07-09 MED ORDER — SODIUM CHLORIDE 0.9 % IV SOLN
8.0000 mg/kg | Freq: Once | INTRAVENOUS | Status: AC
  Administered 2023-07-09: 600 mg via INTRAVENOUS
  Filled 2023-07-09: qty 10

## 2023-07-09 NOTE — Patient Instructions (Signed)
 [facility] - A DEPT OF [facility]  Discharge Instructions: Thank you for choosing [facility] to provide your oncology and hematology care.   If you have a lab appointment with the Cancer Center, please go directly to the Cancer Center and check in at the registration area.   Wear comfortable clothing and clothing appropriate for easy access to any Portacath or PICC line.   We strive to give you quality time with your provider. You may need to reschedule your appointment if you arrive late (15 or more minutes).  Arriving late affects you and other patients whose appointments are after yours.  Also, if you miss three or more appointments without notifying the office, you may be dismissed from the clinic at the provider's discretion.      For prescription refill requests, have your pharmacy contact our office and allow 72 hours for refills to be completed.    Today you received the following chemotherapy and/or immunotherapy agents cyramza , opdivo , and taxol       To help prevent nausea and vomiting after your treatment, we encourage you to take your nausea medication as directed.  BELOW ARE SYMPTOMS THAT SHOULD BE REPORTED IMMEDIATELY: *FEVER GREATER THAN 100.4 F (38 C) OR HIGHER *CHILLS OR SWEATING *NAUSEA AND VOMITING THAT IS NOT CONTROLLED WITH YOUR NAUSEA MEDICATION *UNUSUAL SHORTNESS OF BREATH *UNUSUAL BRUISING OR BLEEDING *URINARY PROBLEMS (pain or burning when urinating, or frequent urination) *BOWEL PROBLEMS (unusual diarrhea, constipation, pain near the anus) TENDERNESS IN MOUTH AND THROAT WITH OR WITHOUT PRESENCE OF ULCERS (sore throat, sores in mouth, or a toothache) UNUSUAL RASH, SWELLING OR PAIN  UNUSUAL VAGINAL DISCHARGE OR ITCHING   Items with * indicate a potential emergency and should be followed up as soon as possible or go to the Emergency Department if any problems should occur.  Please show the CHEMOTHERAPY ALERT CARD  or IMMUNOTHERAPY ALERT CARD at check-in to the Emergency Department and triage nurse.  Should you have questions after your visit or need to cancel or reschedule your appointment, please contact [facility] - [facility] DE[facility]  Dept: [phone]  and follow the prompts.  Office hours are 8:00 a.m. to 4:30 p.m. Monday - Friday. Please note that voicemails left after 4:00 p.m. may not be returned until the following business day.  We are closed weekends and major holidays. You have access to a nurse at all times for urgent questions. Please call the main number to the clinic Dept: [phone] and follow the prompts.   For any non-urgent questions, you may also contact your provider using MyChart. We now offer e-Visits for anyone 26 and older to request care online for non-urgent symptoms. For details visit [email or web address].   Also download the MyChart app! Go to the app store, search MyChart, open the app, select [place], and log in with your MyChart username and password.

## 2023-07-09 NOTE — Progress Notes (Signed)
 Cyramza  added back to D1 of subsequent tx cycles per Dr. Lanny.  Zeah Germano, PharmD, MBA

## 2023-07-09 NOTE — Progress Notes (Signed)
 Ok to reinitiate Cyramza  today per Dr. Lanny.  Hoyt Leanos, PharmD, MBA

## 2023-07-09 NOTE — Progress Notes (Signed)
 [facility]   [phone] [phone]   Clinic Follow up Note   Patient Care Team: Pcp, No as PCP - [name], MD as Consulting Physician (Hematology and Oncology)  Date of Service:  07/09/2023  CHIEF COMPLAINT: f/u of metastatic gastric cancer  CURRENT THERAPY:  Paclitaxel  and ramucirumab  every 2 weeks  Oncology History   Gastric cancer (HCC)  cT3N3Mx, with hypermetabolic left supraclavicular node, and indeterminate adrenal nodule, MMR normal ypT3ypN3a, liver and nodes recurrence in 07/2022 -diagnosed 10/18/21 by EGD for 6 month f/u of gastric ulcers, showed mucosal intramucosal adenocarcinoma. MMR normal. -baseline CEA 11/01/21 significantly elevated at 1,989.58. -EUS on 11/06/21 by Dr. Burnette, staged as T3 N3 (at least stage III) -staging CT CAP 11/08/21 showed: stable gastric antrum wall thickening; increased size of upper abdominal lymph nodes; stable left adrenal nodule, 1.9 cm. -PET scan showed FDG avid lymph nodes within the gastrohepatic ligament, portacaval region, and mesentery. Tracer avid left supraclavicular lymph node and indeterminate left adrenal gland nodule with mild FDG uptake. -he underwent [place] node biopsy on 1/17 which was negative for malignant cells, but no lymphoid tissue seen on biopsy  -repeated PET on 02/07/22 after 3 months neoadjuvant chemo showed resolved left supraclavicular lymph node, and significant improvement in regional lymph nodes and the primary tumor, stable and indeterminate left adrenal nodule.  -CT adrenal protocol showed the left adrenal nodule is likely benign adenoma, no biopsy is needed. -Completed neoadjvant chemo FLOT s/p 12 cycles 11/22/21 - 04/27/2022 -restaging PET on 05/16/22 showed significantly hypermetabolic activity at the primary gastric cancer in pylorus, and a small hypermetabolic mesenteric lymph node, no other evidence of metastasis. surgeon Dr. Stevie aware of PET findings -S/p distal  gastrectomy 05/20/22, path showed ypT3N3a with clear margins, 7/18 + LNs, and lymphovascular invasion identified. There was some treatment effect in a few lymph nodes nut not in the primary tumor -due to rising tumor marker, he underwent PET scan on July 07, 2022, which unfortunately showed hypermetabolic new adenopathy in right hilar and mesentery, diffuse liver metastasis, consistent with metastatic recurrence.  I personally reviewed the PET scan images with patient -Unfortunately his disease is not curable at this stage. -The goal of chemotherapy is palliative, to prolong his life -I discussed his next generation sequencing foundation one result, which showed EGFR amplification, no other targetable mutations.  His tumor was previously tested for HER2 which was negative (0-1+).  PD-L1 CPS score was 2%, no significant benefit of immunotherapy.  -He started paclitaxel  and ramucirumab  on 07/25/2022  He developed worsening neuropathy quickly, and I had to switch his treatment to FOLFIRI on 08/29/2022, every 2 weeks, he is overall tolerating well. -restaging CT 01/19/2023 showed disease progression with new pathologic right paratracheal mediastinal lymph nodes as well as increasing size of several liver metastases. -I changed his chemo to taxol  and ramucizumab on 02/05/2023, every 2 weeks per his request  -restaging CT 04/08/2023 showed partial response in liver and mediastinal nodes   Assessment & Plan Metastatic gastric cancer Metastatic gastric cancer, stage IV, with liver recurrence in 2024. Currently on treatment since January with some response. Concern for resistance due to increased CEA levels since May. - Administer chemotherapy on July 17. - Perform PET scan on July 18 to assess disease progression. - Consider changing treatment regimen if PET scan indicates progression.  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 - Continue use  of topical cream for symptom relief.  Fatigue Persistent fatigue, possibly related to chemotherapy and underlying cancer. He manages to complete work shifts despite fatigue. - Consider short-term use of dexamethasone  for 1-2 days to manage fatigue, with caution regarding side effects such as insomnia and immune suppression.  Hypocalcemia Mild hypocalcemia on recent labs. - Recommend calcium  and vitamin D  supplementation.  Plan - Patient is clinically stable, lab reviewed, adequate for treatment, will proceed with treatment today and continue every 2 weeks - She is scheduled for PET scan on July 18   SUMMARY OF ONCOLOGIC HISTORY: Oncology History Overview Note   Cancer Staging  Gastric cancer [facility]) Staging form: Stomach, AJCC 8th Edition - Clinical stage from 10/18/2021: Stage III (cT3, cN3a, cM0) - Signed by [name], MD on 11/10/2021     Gastric cancer (HCC)  10/18/2021 Procedure   EGD performed under the care of Dr. Dianna  Findings:  -A large, ulcerated, partially circumferential (involving two thirds of the lumen circumference) mass with no bleeding and stigmata of recent bleeding was found in the gastric antrum, at the pylorus and in the prepyloric region of the stomach. -Segmental severe inflammation characterized by congestion (edema) and erythema was found in the gastric antrum.   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 Negative for dysplasia.  Negative for helicobacter pylori  Mismatch Repair (MMR) Protein Imunohistochemistry (IHC): IHC  Expression Result: MLH1: Preserved nuclear expression. MSH2: Preserved nuclear expression. MSH6: Preserved nuclear expression. PMS2: Preserved nuclear expression. Interpretation: NORMAL   10/31/2021 Initial Diagnosis   Gastric cancer (HCC)   11/01/2021 Tumor Marker   CEA = 1,989.58 (^)   11/06/2021 Procedure   Upper EUS, Dr. Burnette  Impression: - Normal esophagus. - A small amount of food (residue) in the stomach. - Congested, friable (with contact bleeding) and ulcerated mucosa in the prepyloric region of the stomach. - Normal examined duodenum. - There was no evidence of significant pathology in the left lobe of the liver. - Many abnormal lymph nodes (over 10) were visualized in the gastrohepatic ligament (level 18), celiac region (level 20), perigastric region, peripancreatic region and aortocaval region. - Wall thickening was seen in the prepyloric region of the stomach. The thickening appeared to be primarily within the deep mucosa (Layer 2) but extended through the muscularis propria. - Overall constellation of findings consistent with at least T3 N3 Mx (at least stage III) gastric adenocarcinoma.   11/08/2021 Imaging   EXAM: CT CHEST, ABDOMEN, AND PELVIS WITH CONTRAST  IMPRESSION: 1. Similar irregular wall thickening of the gastric antrum, consistent with patient's known primary gastric neoplasm. 2. Increased size of the upper abdominal lymph nodes, concerning for worsening nodal disease involvement. 3. Stable left adrenal nodule common nonspecific possibly a metastatic lesion or adenoma. 4. No evidence of metastatic disease in the chest. 5. Left-sided colonic diverticulosis without findings of acute diverticulitis. 6. Enlarged prostate gland. 7.  Aortic Atherosclerosis (ICD10-I70.0).   11/22/2021 - 04/26/2022 Chemotherapy   Patient is on Treatment Plan : GASTROESOPHAGEAL FLOT q14d X 4 cycles     11/27/2021 Imaging    IMPRESSION: 1. The mass within the gastric  antrum is mildly decreased in size when compared  with 11/08/2021. There is persistent FDG uptake associated with this mass compatible with residual tumor. 2. Persistent FDG avid lymph nodes within the gastrohepatic ligament, portacaval region, and mesentery. These are mildly decreased in size when compared with 11/08/2021. 3. Tracer avid left supraclavicular lymph node is also mildly decreased in size when compared with 11/08/2021. 4. Indeterminate left adrenal gland nodule with mild FDG uptake. This may represent a lipid poor adenoma. Metastatic disease not exclude. 5. Diffuse increased uptake throughout the bone marrow is favored to represent treatment related change. 6.  Aortic Atherosclerosis (ICD10-I70.0).     02/07/2022 Imaging    IMPRESSION: 1. No substantial change in hypermetabolism associated with the distal gastric lesion. Although difficult to discern on noncontrast CT imaging, the soft tissue component appears to be decreased since the prior PET-CT. 2. Interval resolution of the hypermetabolic left supraclavicular lymph node. 3. Interval marked decrease of the hypermetabolic gastrohepatic ligament lymphadenopathy. Similar decrease in size and hypermetabolism associated with index nodes identified previously in the 4. porta hepatis and hypogastric region. 5. Stable left adrenal nodule with slight hypermetabolism. Nonspecific finding could represent lipid poor adenoma or metastatic deposit. 6. No new suspicious hypermetabolic disease on today's study. 7.  Aortic Atherosclerosis (ICD10-I70.0).   07/25/2022 - 08/15/2022 Chemotherapy   Patient is on Treatment Plan : GASTROESOPHAGEAL Ramucirumab  D1, 15 + Paclitaxel  D1,8,15 q28d     08/29/2022 - 01/24/2023 Chemotherapy   Patient is on Treatment Plan : COLORECTAL FOLFIRI q14d     02/05/2023 -  Chemotherapy   Patient is on Treatment Plan : GASTROESOPHAGEAL Ramucirumab  D1, 15 + Paclitaxel  D1,8,15 q28d        Discussed the  use of AI scribe software for clinical note transcription with the patient, who gave verbal consent to proceed.  History of Present Illness Jonathan Maynard is a 53 year old male with metastatic gastric cancer who presents for follow-up.  He experiences ongoing neuropathy and fatigue, particularly in his feet, exacerbated by prolonged standing. Soreness and tingling in his feet are managed with a cream and gabapentin , taken as two pills in the morning.  His current medications include gabapentin , nausea medication, and pantoprazole . He inquires about using dexamethasone  for energy, which he previously found helpful.  Blood counts are stable, and kidney and liver functions are normal, except for low calcium  levels. He has calcium  supplements and vitamin D  at home and inquires about taking B12 supplements.  He is scheduled for a PET scan on July 24, 2023, and his next treatment is on July 23, 2023. His previous scan was canceled due to technical issues.     All other systems were reviewed with the patient and are negative.  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    SURGICAL HISTORY: Past Surgical History:  Procedure Laterality Date   BIOPSY  04/12/2021   Procedure: BIOPSY;  Surgeon: Dianna Specking, MD;  Location: [facility];  Service: Gastroenterology;;   ESOPHAGOGASTRODUODENOSCOPY N/A 04/12/2021   Procedure: ESOPHAGOGASTRODUODENOSCOPY (EGD);  Surgeon: Dianna Specking, MD;  Location: [facility];  Service: Gastroenterology;  Laterality: N/A;   ESOPHAGOGASTRODUODENOSCOPY N/A 11/06/2021   Procedure: ESOPHAGOGASTRODUODENOSCOPY (EGD);  Surgeon: Burnette Fallow, MD;  Location: [facility];  Service: Gastroenterology;  Laterality: N/A;   GASTRECTOMY N/A 05/20/2022   Procedure: OPEN PARTIAL GASTRECTOMY WITH GASTROJEJUNAL ANASTOMOSIS;  Surgeon: Stevie, Herlene Righter, MD;  Location: [facility];  Service: General;  Laterality: N/A;   HERNIA REPAIR Left    inguinal   IR IMAGING GUIDED PORT INSERTION  11/15/2021   LAPAROSCOPY N/A 05/20/2022   Procedure: LAPAROSCOPY DIAGNOSTIC;  Surgeon: Stevie Herlene Righter, MD;  Location: [facility];  Service: General;  Laterality: N/A;   UPPER ESOPHAGEAL ENDOSCOPIC ULTRASOUND (EUS) Bilateral 11/06/2021   Procedure: UPPER ESOPHAGEAL ENDOSCOPIC ULTRASOUND (EUS);  Surgeon: Burnette Fallow, MD;  Location: [facility];  Service: Gastroenterology;  Laterality: Bilateral;    I have reviewed the social history and family history with the patient and they are unchanged from previous note.  ALLERGIES:  has no known allergies.  MEDICATIONS:  Current Outpatient Medications  Medication Sig Dispense Refill   acetaminophen  (TYLENOL ) 500 MG tablet Take 1 tablet (500 mg total) by mouth 2 (two) times daily. 60 tablet 3   diclofenac  Sodium (VOLTAREN  ARTHRITIS PAIN) 1 % GEL Apply 4 g topically 4 (four) times daily. 100 g 3   gabapentin  (NEURONTIN ) 600 MG tablet Take 2 tablets (1,200 mg total) by mouth 3 (three) times daily. 180 tablet 1   magic mouthwash (nystatin , lidocaine , diphenhydrAMINE , alum & mag hydroxide) suspension Swish for 5 seconds and swallow 5 mLs by mouth 3 (three) times daily as needed for mouth pain. 140 mL 0   ondansetron  (ZOFRAN -ODT) 4 MG disintegrating tablet Dissolve 1 tablet (4 mg total) by mouth every 6 (six) hours as needed for nausea or vomiting. 30 tablet 1   pantoprazole  (PROTONIX ) 40 MG tablet Take 1 tablet (40 mg) by mouth 2 times daily. 60 tablet 6   prochlorperazine  (COMPAZINE ) 10 MG tablet Take 1 tablet (10 mg total) by mouth every 6 (six) hours as needed for nausea or vomiting. 30 tablet 2   No current facility-administered medications for  this visit.   Facility-Administered Medications Ordered in Other Visits  Medication Dose Route Frequency Provider Last Rate Last Admin   0.9 %  sodium chloride  infusion   Intravenous Continuous [name], MD   Stopped at 07/09/23 1532   sodium chloride  flush (NS) 0.9 % injection 10 mL  10 mL Intracatheter PRN [name], MD   10 mL at 07/09/23 1530    PHYSICAL EXAMINATION: ECOG PERFORMANCE STATUS: 1 - Symptomatic but completely ambulatory  Vitals:   07/09/23 1015 07/09/23 1017  BP: (!) 150/96 (!) 150/82  Pulse: 69   Resp: 16   Temp: 98.8 F (37.1 C)   SpO2: 100%    Wt Readings from Last 3 Encounters:  07/09/23 169 lb 11.2 oz (77 kg)  06/25/23 174 lb 6.4 oz (79.1 kg)  06/11/23 171 lb 3.2 oz (77.7 kg)     GENERAL:alert, no distress and comfortable SKIN: skin color, texture, turgor are normal, no rashes or significant lesions EYES: normal, Conjunctiva are pink and non-injected, sclera clear NECK: supple, thyroid  normal size, non-tender, without nodularity LYMPH:  no palpable lymphadenopathy in the cervical, axillary  LUNGS: clear to auscultation and percussion with normal breathing effort HEART: regular rate & rhythm and no murmurs and no lower extremity edema ABDOMEN:abdomen soft, non-tender and normal bowel sounds Musculoskeletal:no cyanosis of digits and no clubbing  NEURO: alert & oriented x 3 with fluent speech, no focal motor/sensory deficits  Physical Exam    LABORATORY DATA:  I have reviewed the data as listed    Latest  Ref Rng & Units 07/09/2023    9:34 AM 06/25/2023    9:08 AM 06/11/2023    8:12 AM  CBC  WBC 4.0 - 10.5 K/uL 4.6  4.3  3.9   Hemoglobin 13.0 - 17.0 g/dL 7.9  7.2  8.2   Hematocrit 39.0 - 52.0 % 25.1  23.1  26.0   Platelets 150 - 400 K/uL 169  198  186         Latest Ref Rng & Units 07/09/2023    9:34 AM 06/25/2023    9:08 AM 06/11/2023    8:12 AM  CMP  Glucose 70 - 99 mg/dL 896  888  89   BUN 6 - 20 mg/dL 8  7  9    Creatinine 0.61 - 1.24 mg/dL  9.31  9.28  9.36   Sodium 135 - 145 mmol/L 141  143  140   Potassium 3.5 - 5.1 mmol/L 4.0  4.0  4.1   Chloride 98 - 111 mmol/L 110  113  109   CO2 22 - 32 mmol/L 28  27  26    Calcium  8.9 - 10.3 mg/dL 8.7  8.5  8.9   Total Protein 6.5 - 8.1 g/dL 6.8  6.6  7.4   Total Bilirubin 0.0 - 1.2 mg/dL 0.5  0.4  0.4   Alkaline Phos 38 - 126 U/L 91  97  96   AST 15 - 41 U/L 21  20  27    ALT 0 - 44 U/L 10  10  16        RADIOGRAPHIC STUDIES: I have personally reviewed the radiological images as listed and agreed with the findings in the report. No results found.    Orders Placed This Encounter  Procedures   CBC with Differential (Cancer Center Only)    Standing Status:   Future    Expected Date:   07/23/2023    Expiration Date:   07/22/2024   CMP (Cancer Center only)    Standing Status:   Future    Expected Date:   07/23/2023    Expiration Date:   07/22/2024   T4    Standing Status:   Future    Expected Date:   07/23/2023    Expiration Date:   07/22/2024   TSH    Standing Status:   Future    Expected Date:   07/23/2023    Expiration Date:   07/22/2024   Total Protein, Urine dipstick    Standing Status:   Future    Expected Date:   07/23/2023    Expiration Date:   07/22/2024   CBC with Differential (Cancer Center Only)    Standing Status:   Future    Expected Date:   08/06/2023    Expiration Date:   08/05/2024   CMP (Cancer Center only)    Standing Status:   Future    Expected Date:   08/06/2023    Expiration Date:   08/05/2024   CBC with Differential (Cancer Center Only)    Standing Status:   Future    Expected Date:   08/20/2023    Expiration Date:   08/19/2024   CMP (Cancer Center only)    Standing Status:   Future    Expected Date:   08/20/2023    Expiration Date:   08/19/2024   T4    Standing Status:   Future    Expected Date:   08/20/2023    Expiration Date:   08/19/2024   TSH  Standing Status:   Future    Expected Date:   08/20/2023    Expiration Date:   08/19/2024   Total  Protein, Urine dipstick    Standing Status:   Future    Expected Date:   08/20/2023    Expiration Date:   08/19/2024   CBC with Differential (Cancer Center Only)    Standing Status:   Future    Expected Date:   09/03/2023    Expiration Date:   09/02/2024   CMP (Cancer Center only)    Standing Status:   Future    Expected Date:   09/03/2023    Expiration Date:   09/02/2024   All questions were answered. The patient knows to call the clinic with any problems, questions or concerns. No barriers to learning was detected. The total time spent in the appointment was 25 minutes, including review of chart and various tests results, discussions about plan of care and coordination of care plan     Onita Mattock, MD 07/09/2023

## 2023-07-14 ENCOUNTER — Encounter: Payer: Self-pay | Admitting: Hematology

## 2023-07-15 ENCOUNTER — Other Ambulatory Visit: Payer: Self-pay | Admitting: Nurse Practitioner

## 2023-07-16 ENCOUNTER — Other Ambulatory Visit: Payer: Self-pay | Admitting: Hematology

## 2023-07-21 ENCOUNTER — Telehealth: Payer: Self-pay | Admitting: Hematology

## 2023-07-21 ENCOUNTER — Other Ambulatory Visit (HOSPITAL_COMMUNITY)

## 2023-07-21 NOTE — Telephone Encounter (Signed)
 Called to remind the patient of his 7/17 appointments per WQ. Also informed the patient that additional treatment appointments will be made and if there are any concerns to please call out office at [phone].

## 2023-07-22 NOTE — Assessment & Plan Note (Signed)
  cT3N3Mx, with hypermetabolic left supraclavicular node, and indeterminate adrenal nodule, MMR normal ypT3ypN3a, liver and nodes recurrence in 07/2022 -diagnosed 10/18/21 by EGD for 6 month f/u of gastric ulcers, showed mucosal intramucosal adenocarcinoma. MMR normal. -baseline CEA 11/01/21 significantly elevated at 1,989.58. -EUS on 11/06/21 by Dr. Dulce Sellar, staged as T3 N3 (at least stage III) -staging CT CAP 11/08/21 showed: stable gastric antrum wall thickening; increased size of upper abdominal lymph nodes; stable left adrenal nodule, 1.9 cm. -PET scan showed FDG avid lymph nodes within the gastrohepatic ligament, portacaval region, and mesentery. Tracer avid left supraclavicular lymph node and indeterminate left adrenal gland nodule with mild FDG uptake. -he underwent [place] node biopsy on 1/17 which was negative for malignant cells, but no lymphoid tissue seen on biopsy  -repeated PET on 02/07/22 after 3 months neoadjuvant chemo showed resolved left supraclavicular lymph node, and significant improvement in regional lymph nodes and the primary tumor, stable and indeterminate left adrenal nodule.  -CT adrenal protocol showed the left adrenal nodule is likely benign adenoma, no biopsy is needed. -Completed neoadjvant chemo FLOT s/p 12 cycles 11/22/21 - 04/27/2022 -restaging PET on 05/16/22 showed significantly hypermetabolic activity at the primary gastric cancer in pylorus, and a small hypermetabolic mesenteric lymph node, no other evidence of metastasis. surgeon Dr. Sheliah Hatch aware of PET findings -S/p distal gastrectomy 05/20/22, path showed ypT3N3a with clear margins, 7/18 + LNs, and lymphovascular invasion identified. There was some treatment effect in a few lymph nodes nut not in the primary tumor -due to rising tumor marker, he underwent PET scan on July 07, 2022, which unfortunately showed hypermetabolic new adenopathy in right hilar and mesentery, diffuse liver metastasis, consistent with metastatic  recurrence.  I personally reviewed the PET scan images with patient -Unfortunately his disease is not curable at this stage. -The goal of chemotherapy is palliative, to prolong his life -I discussed his next generation sequencing foundation one result, which showed EGFR amplification, no other targetable mutations.  His tumor was previously tested for HER2 which was negative (0-1+).  PD-L1 CPS score was 2%, no significant benefit of immunotherapy.  -He started paclitaxel and ramucirumab on 07/25/2022  He developed worsening neuropathy quickly, and I had to switch his treatment to FOLFIRI on 08/29/2022, every 2 weeks, he is overall tolerating well. -restaging CT 01/19/2023 showed disease progression with new pathologic right paratracheal mediastinal lymph nodes as well as increasing size of several liver metastases. -I changed his chemo to taxol and ramucizumab on 02/05/2023, every 2 weeks per his request  -restaging CT 04/08/2023 showed partial response in liver and mediastinal nodes

## 2023-07-23 ENCOUNTER — Other Ambulatory Visit: Payer: Self-pay

## 2023-07-23 ENCOUNTER — Inpatient Hospital Stay

## 2023-07-23 ENCOUNTER — Inpatient Hospital Stay (HOSPITAL_BASED_OUTPATIENT_CLINIC_OR_DEPARTMENT_OTHER): Admitting: Hematology

## 2023-07-23 ENCOUNTER — Encounter: Payer: Self-pay | Admitting: Hematology

## 2023-07-23 VITALS — BP 138/80 | HR 69 | Temp 98.3°F | Resp 17 | Wt 169.5 lb

## 2023-07-23 DIAGNOSIS — C163 Malignant neoplasm of pyloric antrum: Secondary | ICD-10-CM

## 2023-07-23 DIAGNOSIS — D500 Iron deficiency anemia secondary to blood loss (chronic): Secondary | ICD-10-CM

## 2023-07-23 DIAGNOSIS — R112 Nausea with vomiting, unspecified: Secondary | ICD-10-CM

## 2023-07-23 DIAGNOSIS — D649 Anemia, unspecified: Secondary | ICD-10-CM

## 2023-07-23 DIAGNOSIS — Z5112 Encounter for antineoplastic immunotherapy: Secondary | ICD-10-CM | POA: Diagnosis not present

## 2023-07-23 DIAGNOSIS — Z95828 Presence of other vascular implants and grafts: Secondary | ICD-10-CM

## 2023-07-23 LAB — CBC WITH DIFFERENTIAL (CANCER CENTER ONLY)
Abs Immature Granulocytes: 0.01 K/uL (ref 0.00–0.07)
Basophils Absolute: 0 K/uL (ref 0.0–0.1)
Basophils Relative: 1 %
Eosinophils Absolute: 0.1 K/uL (ref 0.0–0.5)
Eosinophils Relative: 3 %
HCT: 24.7 % — ABNORMAL LOW (ref 39.0–52.0)
Hemoglobin: 7.7 g/dL — ABNORMAL LOW (ref 13.0–17.0)
Immature Granulocytes: 0 %
Lymphocytes Relative: 25 %
Lymphs Abs: 1.1 K/uL (ref 0.7–4.0)
MCH: 25.3 pg — ABNORMAL LOW (ref 26.0–34.0)
MCHC: 31.2 g/dL (ref 30.0–36.0)
MCV: 81.3 fL (ref 80.0–100.0)
Monocytes Absolute: 0.6 K/uL (ref 0.1–1.0)
Monocytes Relative: 13 %
Neutro Abs: 2.6 K/uL (ref 1.7–7.7)
Neutrophils Relative %: 58 %
Platelet Count: 176 K/uL (ref 150–400)
RBC: 3.04 MIL/uL — ABNORMAL LOW (ref 4.22–5.81)
RDW: 23.6 % — ABNORMAL HIGH (ref 11.5–15.5)
WBC Count: 4.5 K/uL (ref 4.0–10.5)
nRBC: 0 % (ref 0.0–0.2)

## 2023-07-23 LAB — CMP (CANCER CENTER ONLY)
ALT: 10 U/L (ref 0–44)
AST: 21 U/L (ref 15–41)
Albumin: 3.8 g/dL (ref 3.5–5.0)
Alkaline Phosphatase: 102 U/L (ref 38–126)
Anion gap: 4 — ABNORMAL LOW (ref 5–15)
BUN: 7 mg/dL (ref 6–20)
CO2: 28 mmol/L (ref 22–32)
Calcium: 8.9 mg/dL (ref 8.9–10.3)
Chloride: 108 mmol/L (ref 98–111)
Creatinine: 0.78 mg/dL (ref 0.61–1.24)
GFR, Estimated: >60 mL/min (ref >60)
Glucose, Bld: 104 mg/dL — ABNORMAL HIGH (ref 70–99)
Potassium: 4.4 mmol/L (ref 3.5–5.1)
Sodium: 140 mmol/L (ref 135–145)
Total Bilirubin: 0.5 mg/dL (ref 0.0–1.2)
Total Protein: 7 g/dL (ref 6.5–8.1)

## 2023-07-23 LAB — IRON AND IRON BINDING CAPACITY (CC-WL,HP ONLY)
Iron: 64 ug/dL (ref 45–182)
Saturation Ratios: 19 % (ref 17.9–39.5)
TIBC: 336 ug/dL (ref 250–450)
UIBC: 272 ug/dL (ref 117–376)

## 2023-07-23 LAB — CEA (ACCESS): CEA (CHCC): 428.92 ng/mL — ABNORMAL HIGH (ref 0.00–5.00)

## 2023-07-23 LAB — SAMPLE TO BLOOD BANK

## 2023-07-23 LAB — FERRITIN: Ferritin: 38 ng/mL (ref 24–336)

## 2023-07-23 LAB — PREPARE RBC (CROSSMATCH)

## 2023-07-23 LAB — TSH: TSH: 1.35 u[IU]/mL (ref 0.350–4.500)

## 2023-07-23 LAB — TOTAL PROTEIN, URINE DIPSTICK: Protein, ur: NEGATIVE mg/dL

## 2023-07-23 MED ORDER — SODIUM CHLORIDE 0.9% FLUSH
10.0000 mL | Freq: Once | INTRAVENOUS | Status: AC
Start: 2023-07-23 — End: 2023-07-23
  Administered 2023-07-23: 10 mL

## 2023-07-23 MED ORDER — SODIUM CHLORIDE 0.9% FLUSH
10.0000 mL | INTRAVENOUS | Status: DC | PRN
  Administered 2023-07-23: 10 mL

## 2023-07-23 MED ORDER — DIPHENHYDRAMINE HCL 50 MG/ML IJ SOLN
25.0000 mg | Freq: Once | INTRAMUSCULAR | Status: AC
  Administered 2023-07-23: 25 mg via INTRAVENOUS
  Filled 2023-07-23: qty 1

## 2023-07-23 MED ORDER — ACETAMINOPHEN 325 MG PO TABS
650.0000 mg | ORAL_TABLET | Freq: Once | ORAL | Status: AC
  Administered 2023-07-23: 650 mg via ORAL
  Filled 2023-07-23: qty 2

## 2023-07-23 MED ORDER — SODIUM CHLORIDE 0.9 % IV SOLN
80.0000 mg/m2 | Freq: Once | INTRAVENOUS | Status: AC
  Administered 2023-07-23: 150 mg via INTRAVENOUS
  Filled 2023-07-23: qty 25

## 2023-07-23 MED ORDER — SODIUM CHLORIDE 0.9 % IV SOLN
240.0000 mg | Freq: Once | INTRAVENOUS | Status: AC
  Administered 2023-07-23: 240 mg via INTRAVENOUS
  Filled 2023-07-23: qty 24

## 2023-07-23 MED ORDER — SODIUM CHLORIDE 0.9 % IV SOLN: INTRAVENOUS | Status: DC

## 2023-07-23 MED ORDER — HEPARIN SOD (PORK) LOCK FLUSH 100 UNIT/ML IV SOLN
500.0000 [IU] | Freq: Once | INTRAVENOUS | Status: AC | PRN
  Administered 2023-07-23: 500 [IU]

## 2023-07-23 MED ORDER — PALONOSETRON HCL INJECTION 0.25 MG/5ML
0.2500 mg | Freq: Once | INTRAVENOUS | Status: AC
  Administered 2023-07-23: 0.25 mg via INTRAVENOUS
  Filled 2023-07-23: qty 5

## 2023-07-23 MED ORDER — FAMOTIDINE IN NACL 20-0.9 MG/50ML-% IV SOLN
20.0000 mg | Freq: Once | INTRAVENOUS | Status: AC
  Administered 2023-07-23: 20 mg via INTRAVENOUS
  Filled 2023-07-23: qty 50

## 2023-07-23 MED ORDER — SODIUM CHLORIDE 0.9 % IV SOLN
8.0000 mg/kg | Freq: Once | INTRAVENOUS | Status: AC
  Administered 2023-07-23: 600 mg via INTRAVENOUS
  Filled 2023-07-23: qty 10

## 2023-07-23 NOTE — Patient Instructions (Addendum)
 [facility] - A DEPT OF [facility]  Discharge Instructions: Thank you for choosing [facility] to provide your oncology and hematology care.   If you have a lab appointment with the Cancer Center, please go directly to the Cancer Center and check in at the registration area.   Wear comfortable clothing and clothing appropriate for easy access to any Portacath or PICC line.   We strive to give you quality time with your provider. You may need to reschedule your appointment if you arrive late (15 or more minutes).  Arriving late affects you and other patients whose appointments are after yours.  Also, if you miss three or more appointments without notifying the office, you may be dismissed from the clinic at the provider's discretion.      For prescription refill requests, have your pharmacy contact our office and allow 72 hours for refills to be completed.    Today you received the following chemotherapy and/or immunotherapy agents: Nivolumab  (Opdivo ), Ramucirumab  (Cyramza ) & Paclitaxel  (Taxol )    To help prevent nausea and vomiting after your treatment, we encourage you to take your nausea medication as directed.  BELOW ARE SYMPTOMS THAT SHOULD BE REPORTED IMMEDIATELY: *FEVER GREATER THAN 100.4 F (38 C) OR HIGHER *CHILLS OR SWEATING *NAUSEA AND VOMITING THAT IS NOT CONTROLLED WITH YOUR NAUSEA MEDICATION *UNUSUAL SHORTNESS OF BREATH *UNUSUAL BRUISING OR BLEEDING *URINARY PROBLEMS (pain or burning when urinating, or frequent urination) *BOWEL PROBLEMS (unusual diarrhea, constipation, pain near the anus) TENDERNESS IN MOUTH AND THROAT WITH OR WITHOUT PRESENCE OF ULCERS (sore throat, sores in mouth, or a toothache) UNUSUAL RASH, SWELLING OR PAIN  UNUSUAL VAGINAL DISCHARGE OR ITCHING   Items with * indicate a potential emergency and should be followed up as soon as possible or go to the Emergency Department if any problems should occur.  Please  show the CHEMOTHERAPY ALERT CARD or IMMUNOTHERAPY ALERT CARD at check-in to the Emergency Department and triage nurse.  Should you have questions after your visit or need to cancel or reschedule your appointment, please contact [facility] - A DEPT OF [facility]  Dept: [phone]  and follow the prompts.  Office hours are 8:00 a.m. to 4:30 p.m. Monday - Friday. Please note that voicemails left after 4:00 p.m. may not be returned until the following business day.  We are closed weekends and major holidays. You have access to a nurse at all times for urgent questions. Please call the main number to the clinic Dept: [phone] and follow the prompts.   For any non-urgent questions, you may also contact your provider using MyChart. We now offer e-Visits for anyone 27 and older to request care online for non-urgent symptoms. For details visit [email or web address].   Also download the MyChart app! Go to the app store, search MyChart, open the app, select [place], and log in with your MyChart username and password.

## 2023-07-23 NOTE — Progress Notes (Signed)
 Verbal order w/readback from Dr. Lanny for 1 unit of PRBCs to be administered on 07/25/2023.  Order placed.

## 2023-07-23 NOTE — Progress Notes (Signed)
 [facility]   [phone] Fax:([phone]   Clinic Follow up Note   Patient Care Team: Pcp, No as PCP - [name], MD as Consulting Physician (Hematology and Oncology)  Date of Service:  07/23/2023  CHIEF COMPLAINT: f/u of gastric cancer  CURRENT THERAPY:  Second line paclitaxel  and ramucirumab  every 2 weeks   Oncology History   Gastric cancer (HCC)  cT3N3Mx, with hypermetabolic left supraclavicular node, and indeterminate adrenal nodule, MMR normal ypT3ypN3a, liver and nodes recurrence in 07/2022 -diagnosed 10/18/21 by EGD for 6 month f/u of gastric ulcers, showed mucosal intramucosal adenocarcinoma. MMR normal. -baseline CEA 11/01/21 significantly elevated at 1,989.58. -EUS on 11/06/21 by Dr. Burnette, staged as T3 N3 (at least stage III) -staging CT CAP 11/08/21 showed: stable gastric antrum wall thickening; increased size of upper abdominal lymph nodes; stable left adrenal nodule, 1.9 cm. -PET scan showed FDG avid lymph nodes within the gastrohepatic ligament, portacaval region, and mesentery. Tracer avid left supraclavicular lymph node and indeterminate left adrenal gland nodule with mild FDG uptake. -he underwent [place] node biopsy on 1/17 which was negative for malignant cells, but no lymphoid tissue seen on biopsy  -repeated PET on 02/07/22 after 3 months neoadjuvant chemo showed resolved left supraclavicular lymph node, and significant improvement in regional lymph nodes and the primary tumor, stable and indeterminate left adrenal nodule.  -CT adrenal protocol showed the left adrenal nodule is likely benign adenoma, no biopsy is needed. -Completed neoadjvant chemo FLOT s/p 12 cycles 11/22/21 - 04/27/2022 -restaging PET on 05/16/22 showed significantly hypermetabolic activity at the primary gastric cancer in pylorus, and a small hypermetabolic mesenteric lymph node, no other evidence of metastasis. surgeon Dr. Stevie aware of PET findings -S/p  distal gastrectomy 05/20/22, path showed ypT3N3a with clear margins, 7/18 + LNs, and lymphovascular invasion identified. There was some treatment effect in a few lymph nodes nut not in the primary tumor -due to rising tumor marker, he underwent PET scan on July 07, 2022, which unfortunately showed hypermetabolic new adenopathy in right hilar and mesentery, diffuse liver metastasis, consistent with metastatic recurrence.  I personally reviewed the PET scan images with patient -Unfortunately his disease is not curable at this stage. -The goal of chemotherapy is palliative, to prolong his life -I discussed his next generation sequencing foundation one result, which showed EGFR amplification, no other targetable mutations.  His tumor was previously tested for HER2 which was negative (0-1+).  PD-L1 CPS score was 2%, no significant benefit of immunotherapy.  -He started paclitaxel  and ramucirumab  on 07/25/2022  He developed worsening neuropathy quickly, and I had to switch his treatment to FOLFIRI on 08/29/2022, every 2 weeks, he is overall tolerating well. -restaging CT 01/19/2023 showed disease progression with new pathologic right paratracheal mediastinal lymph nodes as well as increasing size of several liver metastases. -I changed his chemo to taxol  and ramucizumab on 02/05/2023, every 2 weeks per his request  -restaging CT 04/08/2023 showed partial response in liver and mediastinal nodes   Assessment & Plan Gastric cancer Gastric cancer with ongoing chemotherapy treatment. Tumor marker has increased, and a CT scan is scheduled to assess disease status. Current treatment includes ramucirumab , which may cause epistaxis and hypertension. - Perform CT scan to assess disease status - Schedule one more cycle of treatment if the scan shows stable disease - Consider changing treatment if the scan shows disease progression - Call him next Tuesday to discuss CT scan results and potential treatment changes  Anemia  due  to chemotherapy Anemia secondary to chemotherapy with a recent hemoglobin level of 7.7 g/dL. Previous blood transfusion on June 24 improved energy levels. Iron  studies from May indicate a potential drop in iron  levels, and there is a possibility of slow bleeding contributing to anemia. He is not currently taking oral iron  supplements. - Type and crossmatch blood for potential transfusion - Administer one unit of blood either tomorrow or Saturday - Add iron  study to today's tests to assess need for iron  supplementation - Consider IV iron  supplementation if iron  levels are low  Hypertension due to chemotherapy Hypertension potentially induced by ramucirumab . Blood pressure was elevated during the visit but reportedly normal at home. No current antihypertensive medication is being taken. - Recheck blood pressure in the infusion room - Consider prescribing antihypertensive medication if blood pressure is elevated at home  Epistaxis due to chemotherapy Epistaxis likely related to chemotherapy with ramucirumab , which affects blood vessels and increases bleeding risk. Episodes are manageable with pressure application. - Advise applying pressure to stop epistaxis  Plan - He is clinically stable, tolerating treatment well - Labs reviewed, adequate for treatment, will proceed chemo today and continue every 2 weeks - Restaging CT scan scheduled for tomorrow -f/u in 2 weeks   SUMMARY OF ONCOLOGIC HISTORY: Oncology History Overview Note   Cancer Staging  Gastric cancer [facility]) Staging form: Stomach, AJCC 8th Edition - Clinical stage from 10/18/2021: Stage III (cT3, cN3a, cM0) - Signed by [name], MD on 11/10/2021     Gastric cancer (HCC)  10/18/2021 Procedure   EGD performed under the care of Dr. Dianna  Findings:  -A large, ulcerated, partially circumferential (involving two thirds of the lumen circumference) mass with no bleeding and stigmata of recent bleeding was found in the gastric  antrum, at the pylorus and in the prepyloric region of the stomach. -Segmental severe inflammation characterized by congestion (edema) and erythema was found in the gastric antrum.   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 Negative for dysplasia.  Negative for helicobacter pylori  Mismatch Repair (MMR) Protein Imunohistochemistry (IHC): IHC Expression Result: MLH1: Preserved nuclear expression. MSH2: Preserved nuclear expression. MSH6: Preserved nuclear expression. PMS2: Preserved nuclear expression. Interpretation: NORMAL   10/31/2021 Initial Diagnosis   Gastric cancer (HCC)   11/01/2021 Tumor Marker   CEA = 1,989.58 (^)   11/06/2021 Procedure   Upper EUS, Dr. Burnette  Impression: - Normal esophagus. - A small amount of food (residue) in the stomach. - Congested, friable (with contact bleeding) and ulcerated mucosa in the prepyloric region of the stomach. - Normal examined duodenum. - There was no evidence of significant pathology in the left lobe of the liver. - Many abnormal lymph nodes (over 10) were visualized in the gastrohepatic ligament (level 18), celiac region (level 20), perigastric region, peripancreatic region and aortocaval region. - Wall thickening was seen in the prepyloric region of the stomach. The thickening appeared to be primarily within the deep mucosa (Layer 2) but extended through the muscularis propria. - Overall constellation of findings consistent with at least T3 N3 Mx (at least stage III) gastric adenocarcinoma.   11/08/2021  Imaging  EXAM: CT CHEST, ABDOMEN, AND PELVIS WITH CONTRAST  IMPRESSION: 1. Similar irregular wall thickening of the gastric antrum, consistent with patient's known primary gastric neoplasm. 2. Increased size of the upper abdominal lymph nodes, concerning for worsening nodal disease involvement. 3. Stable left adrenal nodule common nonspecific possibly a metastatic lesion or adenoma. 4. No evidence of metastatic disease in the chest. 5. Left-sided colonic diverticulosis without findings of acute diverticulitis. 6. Enlarged prostate gland. 7.  Aortic Atherosclerosis (ICD10-I70.0).   11/22/2021 - 04/26/2022 Chemotherapy   Patient is on Treatment Plan : GASTROESOPHAGEAL FLOT q14d X 4 cycles     11/27/2021 Imaging    IMPRESSION: 1. The mass within the gastric antrum is mildly decreased in size when compared with 11/08/2021. There is persistent FDG uptake associated with this mass compatible with residual tumor. 2. Persistent FDG avid lymph nodes within the gastrohepatic ligament, portacaval region, and mesentery. These are mildly decreased in size when compared with 11/08/2021. 3. Tracer avid left supraclavicular lymph node is also mildly decreased in size when compared with 11/08/2021. 4. Indeterminate left adrenal gland nodule with mild FDG uptake. This may represent a lipid poor adenoma. Metastatic disease not exclude. 5. Diffuse increased uptake throughout the bone marrow is favored to represent treatment related change. 6.  Aortic Atherosclerosis (ICD10-I70.0).     02/07/2022 Imaging    IMPRESSION: 1. No substantial change in hypermetabolism associated with the distal gastric lesion. Although difficult to discern on noncontrast CT imaging, the soft tissue component appears to be decreased since the prior PET-CT. 2. Interval resolution of the hypermetabolic left supraclavicular lymph node. 3. Interval marked decrease of the hypermetabolic gastrohepatic ligament  lymphadenopathy. Similar decrease in size and hypermetabolism associated with index nodes identified previously in the 4. porta hepatis and hypogastric region. 5. Stable left adrenal nodule with slight hypermetabolism. Nonspecific finding could represent lipid poor adenoma or metastatic deposit. 6. No new suspicious hypermetabolic disease on today's study. 7.  Aortic Atherosclerosis (ICD10-I70.0).   07/25/2022 - 08/15/2022 Chemotherapy   Patient is on Treatment Plan : GASTROESOPHAGEAL Ramucirumab  D1, 15 + Paclitaxel  D1,8,15 q28d     08/29/2022 - 01/24/2023 Chemotherapy   Patient is on Treatment Plan : COLORECTAL FOLFIRI q14d     02/05/2023 -  Chemotherapy   Patient is on Treatment Plan : GASTROESOPHAGEAL Ramucirumab  D1, 15 + Paclitaxel  D1,8,15 q28d        Discussed the use of AI scribe software for clinical note transcription with the patient, who gave verbal consent to proceed.  History of Present Illness Jonathan Maynard is a 53 year old male with gastric cancer who presents for follow-up.  He manages neuropathy with gabapentin  and Tylenol , which are effective. He maintains regular physical activity by walking and generally works, except for medical appointments or scans.  He experiences episodes of epistaxis, resolving with applied pressure. His blood pressure is slightly elevated during the visit but typically normal at home.  He received a blood transfusion on June 24th due to a low blood count of 7.2, feeling more energetic afterward. His current blood count is 7.7, and he does not feel tired.  He has not completed a recent urine test, but previous tests showed minimal protein. He has not been taking oral iron  supplements recently, although he has them available. He started taking B12 supplements two days ago, and his last IV iron  was administered in 2023. No nausea or diarrhea.     All other systems were reviewed with the patient and are negative.  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    SURGICAL HISTORY: Past Surgical History:  Procedure Laterality Date   BIOPSY  04/12/2021   Procedure: BIOPSY;  Surgeon: Dianna Specking, MD;  Location: [facility];  Service: Gastroenterology;;   ESOPHAGOGASTRODUODENOSCOPY N/A 04/12/2021   Procedure: ESOPHAGOGASTRODUODENOSCOPY (EGD);  Surgeon: Dianna Specking, MD;  Location: [facility];  Service: Gastroenterology;  Laterality: N/A;   ESOPHAGOGASTRODUODENOSCOPY N/A 11/06/2021   Procedure: ESOPHAGOGASTRODUODENOSCOPY (EGD);  Surgeon: Burnette Fallow, MD;  Location: [facility];  Service: Gastroenterology;  Laterality: N/A;   GASTRECTOMY N/A 05/20/2022   Procedure: OPEN PARTIAL GASTRECTOMY WITH GASTROJEJUNAL ANASTOMOSIS;  Surgeon: Stevie, Herlene Righter, MD;  Location: [facility];  Service: General;  Laterality: N/A;   HERNIA REPAIR Left    inguinal   IR IMAGING GUIDED PORT INSERTION  11/15/2021   LAPAROSCOPY N/A 05/20/2022   Procedure: LAPAROSCOPY DIAGNOSTIC;  Surgeon: Stevie Herlene Righter, MD;  Location: [facility];  Service: General;  Laterality: N/A;   UPPER ESOPHAGEAL ENDOSCOPIC ULTRASOUND (EUS) Bilateral 11/06/2021   Procedure: UPPER ESOPHAGEAL ENDOSCOPIC ULTRASOUND (EUS);  Surgeon: Burnette Fallow, MD;  Location: [facility];  Service: Gastroenterology;  Laterality: Bilateral;    I have reviewed the social history and family history with the patient and they are unchanged from previous note.  ALLERGIES:  has no known allergies.  MEDICATIONS:  Current Outpatient Medications  Medication Sig Dispense Refill   acetaminophen  (TYLENOL ) 500 MG tablet Take 1 tablet (500 mg total) by mouth 2 (two) times daily. 60 tablet 3    diclofenac  Sodium (VOLTAREN  ARTHRITIS PAIN) 1 % GEL Apply 4 g topically 4 (four) times daily. 100 g 3   gabapentin  (NEURONTIN ) 600 MG tablet Take 2 tablets (1,200 mg total) by mouth 3 (three) times daily. 180 tablet 1   magic mouthwash (nystatin , lidocaine , diphenhydrAMINE , alum & mag hydroxide) suspension Swish for 5 seconds and swallow 5 mLs by mouth 3 (three) times daily as needed for mouth pain. 140 mL 0   ondansetron  (ZOFRAN -ODT) 4 MG disintegrating tablet Dissolve 1 tablet (4 mg total) by mouth every 6 (six) hours as needed for nausea or vomiting. 30 tablet 1   pantoprazole  (PROTONIX ) 40 MG tablet Take 1 tablet (40 mg) by mouth 2 times daily. 60 tablet 6   prochlorperazine  (COMPAZINE ) 10 MG tablet Take 1 tablet (10 mg total) by mouth every 6 (six) hours as needed for nausea or vomiting. 30 tablet 2   No current facility-administered medications for this visit.   Facility-Administered Medications Ordered in Other Visits  Medication Dose Route Frequency Provider Last Rate Last Admin   0.9 %  sodium chloride  infusion   Intravenous Continuous [name], MD   Stopped at 07/23/23 1306   sodium chloride  flush (NS) 0.9 % injection 10 mL  10 mL Intracatheter PRN [name], MD   10 mL at 07/23/23 1307    PHYSICAL EXAMINATION: ECOG PERFORMANCE STATUS: 1 - Symptomatic but completely ambulatory  Vitals:   07/23/23 0849  BP: 138/80  Pulse: 69  Resp: 17  Temp: 98.3 F (36.8 C)  SpO2: 99%   Wt Readings from Last 3 Encounters:  07/23/23 169 lb 7.7 oz (76.9 kg)  07/09/23 169 lb  11.2 oz (77 kg)  06/25/23 174 lb 6.4 oz (79.1 kg)     GENERAL:alert, no distress and comfortable SKIN: skin color, texture, turgor are normal, no rashes or significant lesions EYES: normal, Conjunctiva are pink and non-injected, sclera clear NECK: supple, thyroid  normal size, non-tender, without nodularity LYMPH:  no palpable lymphadenopathy in the cervical, axillary  LUNGS: clear to auscultation and percussion with  normal breathing effort HEART: regular rate & rhythm and no murmurs and no lower extremity edema ABDOMEN:abdomen soft, non-tender and normal bowel sounds Musculoskeletal:no cyanosis of digits and no clubbing  NEURO: alert & oriented x 3 with fluent speech, no focal motor/sensory deficits  Physical Exam    LABORATORY DATA:  I have reviewed the data as listed    Latest Ref Rng & Units 07/23/2023    7:46 AM 07/09/2023    9:34 AM 06/25/2023    9:08 AM  CBC  WBC 4.0 - 10.5 K/uL 4.5  4.6  4.3   Hemoglobin 13.0 - 17.0 g/dL 7.7  7.9  7.2   Hematocrit 39.0 - 52.0 % 24.7  25.1  23.1   Platelets 150 - 400 K/uL 176  169  198         Latest Ref Rng & Units 07/23/2023    7:46 AM 07/09/2023    9:34 AM 06/25/2023    9:08 AM  CMP  Glucose 70 - 99 mg/dL 895  896  888   BUN 6 - 20 mg/dL 7  8  7    Creatinine 0.61 - 1.24 mg/dL 9.21  9.31  9.28   Sodium 135 - 145 mmol/L 140  141  143   Potassium 3.5 - 5.1 mmol/L 4.4  4.0  4.0   Chloride 98 - 111 mmol/L 108  110  113   CO2 22 - 32 mmol/L 28  28  27    Calcium  8.9 - 10.3 mg/dL 8.9  8.7  8.5   Total Protein 6.5 - 8.1 g/dL 7.0  6.8  6.6   Total Bilirubin 0.0 - 1.2 mg/dL 0.5  0.5  0.4   Alkaline Phos 38 - 126 U/L 102  91  97   AST 15 - 41 U/L 21  21  20    ALT 0 - 44 U/L 10  10  10        RADIOGRAPHIC STUDIES: I have personally reviewed the radiological images as listed and agreed with the findings in the report. No results found.    Orders Placed This Encounter  Procedures   Ferritin    Standing Status:   Standing    Number of Occurrences:   20    Expiration Date:   07/22/2024   All questions were answered. The patient knows to call the clinic with any problems, questions or concerns. No barriers to learning was detected. The total time spent in the appointment was 30 minutes, including review of chart and various tests results, discussions about plan of care and coordination of care plan     Onita Mattock, MD 07/23/2023

## 2023-07-23 NOTE — Progress Notes (Signed)
 Per Dr. Lanny pt will get tx today w/hbg 7.7 and will get 1 unit of PRBCs on 07/24/2023 or 07/25/2023.  Secure Chat sent to Infusion [facility] [employer].  Awaiting her response.

## 2023-07-24 ENCOUNTER — Other Ambulatory Visit (HOSPITAL_COMMUNITY)

## 2023-07-24 ENCOUNTER — Encounter (HOSPITAL_COMMUNITY)
Admission: RE | Admit: 2023-07-24 | Discharge: 2023-07-24 | Disposition: A | Discharge status: Home / Self Care | Source: Ambulatory Visit | Attending: Nurse Practitioner | Admitting: Nurse Practitioner

## 2023-07-24 DIAGNOSIS — C163 Malignant neoplasm of pyloric antrum: Secondary | ICD-10-CM | POA: Insufficient documentation

## 2023-07-24 LAB — GLUCOSE, CAPILLARY: Glucose-Capillary: 93 mg/dL (ref 70–99)

## 2023-07-24 LAB — T4: T4, Total: 7.1 ug/dL (ref 4.5–12.0)

## 2023-07-24 MED ORDER — FLUDEOXYGLUCOSE F - 18 (FDG) INJECTION
8.5000 | Freq: Once | INTRAVENOUS | Status: AC | PRN
  Administered 2023-07-24: 8.4 via INTRAVENOUS

## 2023-07-25 ENCOUNTER — Inpatient Hospital Stay

## 2023-07-25 DIAGNOSIS — D500 Iron deficiency anemia secondary to blood loss (chronic): Secondary | ICD-10-CM

## 2023-07-25 DIAGNOSIS — D649 Anemia, unspecified: Secondary | ICD-10-CM

## 2023-07-25 DIAGNOSIS — Z5112 Encounter for antineoplastic immunotherapy: Secondary | ICD-10-CM | POA: Diagnosis not present

## 2023-07-25 DIAGNOSIS — C163 Malignant neoplasm of pyloric antrum: Secondary | ICD-10-CM

## 2023-07-25 MED ORDER — SODIUM CHLORIDE 0.9% IV SOLUTION
250.0000 mL | INTRAVENOUS | Status: DC
  Administered 2023-07-25: 100 mL via INTRAVENOUS

## 2023-07-25 MED ORDER — HEPARIN SOD (PORK) LOCK FLUSH 100 UNIT/ML IV SOLN
250.0000 [IU] | INTRAVENOUS | Status: AC | PRN
  Administered 2023-07-25: 250 [IU]

## 2023-07-25 MED ORDER — SODIUM CHLORIDE 0.9% FLUSH
3.0000 mL | INTRAVENOUS | Status: AC | PRN
  Administered 2023-07-25: 3 mL

## 2023-07-25 NOTE — Patient Instructions (Signed)

## 2023-07-27 LAB — TYPE AND SCREEN
ABO/RH(D): O POS
Antibody Screen: NEGATIVE
Unit division: 0

## 2023-07-27 LAB — BPAM RBC
Blood Product Expiration Date: 202508122359
ISSUE DATE / TIME: 202507190922
Unit Type and Rh: 5100

## 2023-07-27 NOTE — Progress Notes (Signed)
 [facility] Radiology Reading Room to expedite the read on pt's recent PET Scan.  Radiology Tech stated they will expedite the read.

## 2023-07-28 ENCOUNTER — Inpatient Hospital Stay (HOSPITAL_BASED_OUTPATIENT_CLINIC_OR_DEPARTMENT_OTHER): Admitting: Hematology

## 2023-07-28 DIAGNOSIS — Z5112 Encounter for antineoplastic immunotherapy: Secondary | ICD-10-CM | POA: Diagnosis not present

## 2023-07-28 DIAGNOSIS — C163 Malignant neoplasm of pyloric antrum: Secondary | ICD-10-CM | POA: Diagnosis not present

## 2023-07-28 MED ORDER — DEXAMETHASONE 4 MG PO TABS: 4.0000 mg | ORAL_TABLET | Freq: Every day | ORAL | 1 refills | Status: DC

## 2023-07-28 NOTE — Progress Notes (Signed)
 [facility]   [phone]) [phone] [phone]   Clinic Follow up Note   Patient Care Team: Pcp, No as PCP - General [name], MD as Consulting Physician (Hematology and Oncology) 07/28/2023  I connected with Lonni Quinton Seip on 07/28/23 at  8:30 AM EDT by telephone and verified that I am speaking with the correct person using two identifiers.   I discussed the limitations, risks, security and privacy concerns of performing an evaluation and management service by telephone and the availability of in person appointments. I also discussed with the patient that there may be a patient responsible charge related to this service. The patient expressed understanding and agreed to proceed.   Patient's location:  work  Provider's location:  Office    CHIEF COMPLAINT: Review PET scan findings   CURRENT THERAPY: Chemotherapy paclitaxel  and ramucirumab  every 2 weeks  Oncology history Gastric cancer (HCC)  cT3N3Mx, with hypermetabolic left supraclavicular node, and indeterminate adrenal nodule, MMR normal ypT3ypN3a, liver and nodes recurrence in 07/2022 -diagnosed 10/18/21 by EGD for 6 month f/u of gastric ulcers, showed mucosal intramucosal adenocarcinoma. MMR normal. -baseline CEA 11/01/21 significantly elevated at 1,989.58. -EUS on 11/06/21 by Dr. Burnette, staged as T3 N3 (at least stage III) -staging CT CAP 11/08/21 showed: stable gastric antrum wall thickening; increased size of upper abdominal lymph nodes; stable left adrenal nodule, 1.9 cm. -PET scan showed FDG avid lymph nodes within the gastrohepatic ligament, portacaval region, and mesentery. Tracer avid left supraclavicular lymph node and indeterminate left adrenal gland nodule with mild FDG uptake. -he underwent [place] node biopsy on 1/17 which was negative for malignant cells, but no lymphoid tissue seen on biopsy  -repeated PET on 02/07/22 after 3 months neoadjuvant chemo showed resolved left supraclavicular  lymph node, and significant improvement in regional lymph nodes and the primary tumor, stable and indeterminate left adrenal nodule.  -CT adrenal protocol showed the left adrenal nodule is likely benign adenoma, no biopsy is needed. -Completed neoadjvant chemo FLOT s/p 12 cycles 11/22/21 - 04/27/2022 -restaging PET on 05/16/22 showed significantly hypermetabolic activity at the primary gastric cancer in pylorus, and a small hypermetabolic mesenteric lymph node, no other evidence of metastasis. surgeon Dr. Stevie aware of PET findings -S/p distal gastrectomy 05/20/22, path showed ypT3N3a with clear margins, 7/18 + LNs, and lymphovascular invasion identified. There was some treatment effect in a few lymph nodes nut not in the primary tumor -due to rising tumor marker, he underwent PET scan on July 07, 2022, which unfortunately showed hypermetabolic new adenopathy in right hilar and mesentery, diffuse liver metastasis, consistent with metastatic recurrence.  I personally reviewed the PET scan images with patient -Unfortunately his disease is not curable at this stage. -The goal of chemotherapy is palliative, to prolong his life -I discussed his next generation sequencing foundation one result, which showed EGFR amplification, no other targetable mutations.  His tumor was previously tested for HER2 which was negative (0-1+).  PD-L1 CPS score was 2%, no significant benefit of immunotherapy.  -He started paclitaxel  and ramucirumab  on 07/25/2022  He developed worsening neuropathy quickly, and I had to switch his treatment to FOLFIRI on 08/29/2022, every 2 weeks, he is overall tolerating well. -restaging CT 01/19/2023 showed disease progression with new pathologic right paratracheal mediastinal lymph nodes as well as increasing size of several liver metastases. -I changed his chemo to taxol  and ramucizumab on 02/05/2023, every 2 weeks per his request  -restaging CT 04/08/2023 showed partial response in liver and  mediastinal  nodes  -restaging PET 07/24/2023 showed mixed response comparing to PET in 07/2022 and CT in 04/2023, with significantly improvement in liver mets and slightly progression in nodes and left adrenal glad metastasis -will continue paclitaxel  and bevacizumab every 2 weeks  Assessment & Plan Metastatic gastric cancer Mixed response to current chemotherapy regimen with improvement in liver metastases but slight worsening in some lymph nodes in the chest and abdomen. Overall disease is considered stable. - Continue current chemotherapy regimen with Carbo and romosozumab. - Schedule next chemotherapy treatment for August 06, 2023, and two treatments in August. - Repeat imaging in 2-3 months or sooner if tumor markers increase rapidly.  Chemotherapy-induced anemia Anemia is multifactorial, primarily due to chemotherapy. Previous blood transfusions have been beneficial. Iron  levels are on the low end of normal, and IV iron  may help improve anemia. - Administer 200 mg IV iron  during chemotherapy infusion to improve anemia. - Monitor blood counts and consider blood transfusion if counts drop.  Peripheral neuropathy due to chemotherapy Neuropathy is a significant side effect of both current and potential future chemotherapy regimens. Previous regimens have been adjusted due to neuropathy severity. - Monitor neuropathy symptoms closely during chemotherapy.  Plan - I personally reviewed his restaging PET scan images, and compared to previous CT and PET, and discussed the findings with patient and his wife in detail.  He had a mixed response, plan to continue current therapy. - Will schedule his next treatment next week - I also recommend IV Venofer  200 mg for 2 treatments with his chemo, due to his anemia which has required blood transfusion and mild iron  deficiency. -f/u next week    SUMMARY OF ONCOLOGIC HISTORY: Oncology History Overview Note   Cancer Staging  Gastric cancer [facility]) Staging  form: Stomach, AJCC 8th Edition - Clinical stage from 10/18/2021: Stage III (cT3, cN3a, cM0) - Signed by [name], MD on 11/10/2021     Gastric cancer (HCC)  10/18/2021 Procedure   EGD performed under the care of Dr. Dianna  Findings:  -A large, ulcerated, partially circumferential (involving two thirds of the lumen circumference) mass with no bleeding and stigmata of recent bleeding was found in the gastric antrum, at the pylorus and in the prepyloric region of the stomach. -Segmental severe inflammation characterized by congestion (edema) and erythema was found in the gastric antrum.   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 Negative for dysplasia.  Negative for helicobacter pylori  Mismatch Repair (MMR) Protein Imunohistochemistry (IHC): IHC Expression Result: MLH1: Preserved nuclear expression. MSH2: Preserved nuclear expression. MSH6: Preserved nuclear expression. PMS2: Preserved nuclear expression. Interpretation: NORMAL   10/31/2021 Initial Diagnosis   Gastric cancer (HCC)   11/01/2021 Tumor Marker   CEA = 1,989.58 (^)   11/06/2021 Procedure   Upper EUS, Dr. Burnette  Impression: - Normal esophagus. - A small amount of food (residue) in the stomach. - Congested, friable (with contact bleeding) and ulcerated mucosa in the prepyloric region of the stomach. - Normal examined duodenum. - There was no evidence of significant pathology in the left lobe of the liver. - Many abnormal lymph nodes (over 10) were visualized in the  gastrohepatic ligament  (level 18), celiac region (level 20), perigastric region, peripancreatic region and aortocaval region. - Wall thickening was seen in the prepyloric region of the stomach. The thickening appeared to be primarily within the deep mucosa (Layer 2) but extended through the muscularis propria. - Overall constellation of findings consistent with at least T3 N3 Mx (at least stage III) gastric adenocarcinoma.   11/08/2021 Imaging   EXAM: CT CHEST, ABDOMEN, AND PELVIS WITH CONTRAST  IMPRESSION: 1. Similar irregular wall thickening of the gastric antrum, consistent with patient's known primary gastric neoplasm. 2. Increased size of the upper abdominal lymph nodes, concerning for worsening nodal disease involvement. 3. Stable left adrenal nodule common nonspecific possibly a metastatic lesion or adenoma. 4. No evidence of metastatic disease in the chest. 5. Left-sided colonic diverticulosis without findings of acute diverticulitis. 6. Enlarged prostate gland. 7.  Aortic Atherosclerosis (ICD10-I70.0).   11/22/2021 - 04/26/2022 Chemotherapy   Patient is on Treatment Plan : GASTROESOPHAGEAL FLOT q14d X 4 cycles     11/27/2021 Imaging    IMPRESSION: 1. The mass within the gastric antrum is mildly decreased in size when compared with 11/08/2021. There is persistent FDG uptake associated with this mass compatible with residual tumor. 2. Persistent FDG avid lymph nodes within the gastrohepatic ligament, portacaval region, and mesentery. These are mildly decreased in size when compared with 11/08/2021. 3. Tracer avid left supraclavicular lymph node is also mildly decreased in size when compared with 11/08/2021. 4. Indeterminate left adrenal gland nodule with mild FDG uptake. This may represent a lipid poor adenoma. Metastatic disease not exclude. 5. Diffuse increased uptake throughout the bone marrow is favored to represent treatment related change. 6.  Aortic Atherosclerosis (ICD10-I70.0).      02/07/2022 Imaging    IMPRESSION: 1. No substantial change in hypermetabolism associated with the distal gastric lesion. Although difficult to discern on noncontrast CT imaging, the soft tissue component appears to be decreased since the prior PET-CT. 2. Interval resolution of the hypermetabolic left supraclavicular lymph node. 3. Interval marked decrease of the hypermetabolic gastrohepatic ligament lymphadenopathy. Similar decrease in size and hypermetabolism associated with index nodes identified previously in the 4. porta hepatis and hypogastric region. 5. Stable left adrenal nodule with slight hypermetabolism. Nonspecific finding could represent lipid poor adenoma or metastatic deposit. 6. No new suspicious hypermetabolic disease on today's study. 7.  Aortic Atherosclerosis (ICD10-I70.0).   07/25/2022 - 08/15/2022 Chemotherapy   Patient is on Treatment Plan : GASTROESOPHAGEAL Ramucirumab  D1, 15 + Paclitaxel  D1,8,15 q28d     08/29/2022 - 01/24/2023 Chemotherapy   Patient is on Treatment Plan : COLORECTAL FOLFIRI q14d     02/05/2023 -  Chemotherapy   Patient is on Treatment Plan : GASTROESOPHAGEAL Ramucirumab  D1, 15 + Paclitaxel  D1,8,15 q28d       Discussed the use of AI scribe software for clinical note transcription with the patient, who gave verbal consent to proceed.  History of Present Illness Jonathan Maynard is a 53 year old male with metastatic gastric cancer who presents with a phone visit to discuss CT and PET scan findings. He is accompanied by his wife, Mrs. Eisenberg.  The recent PET scan shows a mixed response. Liver metastasis has improved, but some lymph nodes in the chest and abdomen appear slightly worse with increased brightness, though his size remains stable.  He started a new chemotherapy regimen in January 2025 with Carbo and romosozumab due to significant neuropathy from previous treatments. He has been on and off different regimens  because of  side effects and disease progression.  He feels okay overall but experiences some aggravation. He is taking dexamethasone  and may need a refill soon. His iron  levels are stable as of July 20, 2023. He has received IV iron  and blood transfusions for chemotherapy-induced anemia, which improved his symptoms. He is concerned about neuropathy from chemotherapy.     REVIEW OF SYSTEMS:   Constitutional: Denies fevers, chills or abnormal weight loss Eyes: Denies blurriness of vision Ears, nose, mouth, throat, and face: Denies mucositis or sore throat Respiratory: Denies cough, dyspnea or wheezes Cardiovascular: Denies palpitation, chest discomfort or lower extremity swelling Gastrointestinal:  Denies nausea, heartburn or change in bowel habits Skin: Denies abnormal skin rashes Lymphatics: Denies new lymphadenopathy or easy bruising Neurological:Denies numbness, tingling or new weaknesses Behavioral/Psych: Mood is stable, no new changes  All other systems were reviewed with the patient and are negative.  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    SURGICAL HISTORY: Past Surgical History:  Procedure Laterality Date   BIOPSY  04/12/2021   Procedure: BIOPSY;  Surgeon: Dianna Specking, MD;  Location: [facility];  Service: Gastroenterology;;   ESOPHAGOGASTRODUODENOSCOPY N/A 04/12/2021   Procedure: ESOPHAGOGASTRODUODENOSCOPY (EGD);  Surgeon: Dianna Specking, MD;  Location: [facility];  Service: Gastroenterology;  Laterality: N/A;   ESOPHAGOGASTRODUODENOSCOPY N/A 11/06/2021   Procedure: ESOPHAGOGASTRODUODENOSCOPY (EGD);  Surgeon: Burnette Fallow, MD;  Location: [facility];  Service: Gastroenterology;  Laterality:  N/A;   GASTRECTOMY N/A 05/20/2022   Procedure: OPEN PARTIAL GASTRECTOMY WITH GASTROJEJUNAL ANASTOMOSIS;  Surgeon: Stevie, Herlene Righter, MD;  Location: [facility];  Service: General;  Laterality: N/A;   HERNIA REPAIR Left    inguinal   IR IMAGING GUIDED PORT INSERTION  11/15/2021   LAPAROSCOPY N/A 05/20/2022   Procedure: LAPAROSCOPY DIAGNOSTIC;  Surgeon: Stevie Herlene Righter, MD;  Location: [facility];  Service: General;  Laterality: N/A;   UPPER ESOPHAGEAL ENDOSCOPIC ULTRASOUND (EUS) Bilateral 11/06/2021   Procedure: UPPER ESOPHAGEAL ENDOSCOPIC ULTRASOUND (EUS);  Surgeon: Burnette Fallow, MD;  Location: [facility];  Service: Gastroenterology;  Laterality: Bilateral;    I have reviewed the social history and family history with the patient and they are unchanged from previous note.  ALLERGIES:  has no known allergies.  MEDICATIONS:  Current Outpatient Medications  Medication Sig Dispense Refill   acetaminophen  (TYLENOL ) 500 MG tablet Take 1 tablet (500 mg total) by mouth 2 (two) times daily. 60 tablet 3   diclofenac  Sodium (VOLTAREN  ARTHRITIS PAIN) 1 % GEL Apply 4 g topically 4 (four) times daily. 100 g 3   gabapentin  (NEURONTIN ) 600 MG tablet Take 2 tablets (1,200 mg total) by mouth 3 (three) times daily. 180 tablet 1   magic mouthwash (nystatin , lidocaine , diphenhydrAMINE , alum & mag hydroxide) suspension Swish for 5 seconds and swallow 5 mLs by mouth 3 (three) times daily as needed for mouth pain. 140 mL 0   ondansetron  (ZOFRAN -ODT) 4 MG disintegrating tablet Dissolve 1 tablet (4 mg total) by mouth every 6 (six) hours as needed for nausea or vomiting. 30 tablet 1   pantoprazole  (PROTONIX ) 40 MG tablet Take 1 tablet (40 mg) by mouth 2 times daily. 60 tablet 6   prochlorperazine  (COMPAZINE ) 10 MG  tablet Take 1 tablet (10 mg total) by mouth every 6 (six) hours as needed for nausea or vomiting. 30 tablet 2   No current facility-administered medications for this visit.    PHYSICAL  EXAMINATION: Not performed   LABORATORY DATA:  I have reviewed the data as listed    Latest Ref Rng & Units 07/23/2023    7:46 AM 07/09/2023    9:34 AM 06/25/2023    9:08 AM  CBC  WBC 4.0 - 10.5 K/uL 4.5  4.6  4.3   Hemoglobin 13.0 - 17.0 g/dL 7.7  7.9  7.2   Hematocrit 39.0 - 52.0 % 24.7  25.1  23.1   Platelets 150 - 400 K/uL 176  169  198         Latest Ref Rng & Units 07/23/2023    7:46 AM 07/09/2023    9:34 AM 06/25/2023    9:08 AM  CMP  Glucose 70 - 99 mg/dL 895  896  888   BUN 6 - 20 mg/dL 7  8  7    Creatinine 0.61 - 1.24 mg/dL 9.21  9.31  9.28   Sodium 135 - 145 mmol/L 140  141  143   Potassium 3.5 - 5.1 mmol/L 4.4  4.0  4.0   Chloride 98 - 111 mmol/L 108  110  113   CO2 22 - 32 mmol/L 28  28  27    Calcium  8.9 - 10.3 mg/dL 8.9  8.7  8.5   Total Protein 6.5 - 8.1 g/dL 7.0  6.8  6.6   Total Bilirubin 0.0 - 1.2 mg/dL 0.5  0.5  0.4   Alkaline Phos 38 - 126 U/L 102  91  97   AST 15 - 41 U/L 21  21  20    ALT 0 - 44 U/L 10  10  10        RADIOGRAPHIC STUDIES: I have personally reviewed the radiological images as listed and agreed with the findings in the report. No results found.     I discussed the assessment and treatment plan with the patient. The patient was provided an opportunity to ask questions and all were answered. The patient agreed with the plan and demonstrated an understanding of the instructions.   The patient was advised to call back or seek an in-person evaluation if the symptoms worsen or if the condition fails to improve as anticipated.  I provided 25 minutes of non face-to-face telephone visit time during this encounter, including review of chart and various tests results, discussions about plan of care and coordination of care plan.    Onita Mattock, MD 07/28/23

## 2023-07-28 NOTE — Assessment & Plan Note (Signed)
  cT3N3Mx, with hypermetabolic left supraclavicular node, and indeterminate adrenal nodule, MMR normal ypT3ypN3a, liver and nodes recurrence in 07/2022 -diagnosed 10/18/21 by EGD for 6 month f/u of gastric ulcers, showed mucosal intramucosal adenocarcinoma. MMR normal. -baseline CEA 11/01/21 significantly elevated at 1,989.58. -EUS on 11/06/21 by Dr. Burnette, staged as T3 N3 (at least stage III) -staging CT CAP 11/08/21 showed: stable gastric antrum wall thickening; increased size of upper abdominal lymph nodes; stable left adrenal nodule, 1.9 cm. -PET scan showed FDG avid lymph nodes within the gastrohepatic ligament, portacaval region, and mesentery. Tracer avid left supraclavicular lymph node and indeterminate left adrenal gland nodule with mild FDG uptake. -he underwent [place] node biopsy on 1/17 which was negative for malignant cells, but no lymphoid tissue seen on biopsy  -repeated PET on 02/07/22 after 3 months neoadjuvant chemo showed resolved left supraclavicular lymph node, and significant improvement in regional lymph nodes and the primary tumor, stable and indeterminate left adrenal nodule.  -CT adrenal protocol showed the left adrenal nodule is likely benign adenoma, no biopsy is needed. -Completed neoadjvant chemo FLOT s/p 12 cycles 11/22/21 - 04/27/2022 -restaging PET on 05/16/22 showed significantly hypermetabolic activity at the primary gastric cancer in pylorus, and a small hypermetabolic mesenteric lymph node, no other evidence of metastasis. surgeon Dr. Stevie aware of PET findings -S/p distal gastrectomy 05/20/22, path showed ypT3N3a with clear margins, 7/18 + LNs, and lymphovascular invasion identified. There was some treatment effect in a few lymph nodes nut not in the primary tumor -due to rising tumor marker, he underwent PET scan on July 07, 2022, which unfortunately showed hypermetabolic new adenopathy in right hilar and mesentery, diffuse liver metastasis, consistent with metastatic  recurrence.  I personally reviewed the PET scan images with patient -Unfortunately his disease is not curable at this stage. -The goal of chemotherapy is palliative, to prolong his life -I discussed his next generation sequencing foundation one result, which showed EGFR amplification, no other targetable mutations.  His tumor was previously tested for HER2 which was negative (0-1+).  PD-L1 CPS score was 2%, no significant benefit of immunotherapy.  -He started paclitaxel  and ramucirumab  on 07/25/2022  He developed worsening neuropathy quickly, and I had to switch his treatment to FOLFIRI on 08/29/2022, every 2 weeks, he is overall tolerating well. -restaging CT 01/19/2023 showed disease progression with new pathologic right paratracheal mediastinal lymph nodes as well as increasing size of several liver metastases. -I changed his chemo to taxol  and ramucizumab on 02/05/2023, every 2 weeks per his request  -restaging CT 04/08/2023 showed partial response in liver and mediastinal nodes  -restaging PET 07/24/2023 showed mixed response comparing to PET in 07/2022 and CT in 04/2023, with significantly improvement in liver mets and slightly progression in nodes and left adrenal glad metastasis -will continue paclitaxel  and bevacizumab every 2 weeks

## 2023-07-29 ENCOUNTER — Encounter: Payer: Self-pay | Admitting: Hematology

## 2023-07-29 ENCOUNTER — Encounter: Payer: Self-pay | Admitting: Nurse Practitioner

## 2023-07-30 ENCOUNTER — Encounter: Payer: Self-pay | Admitting: Hematology

## 2023-07-30 ENCOUNTER — Other Ambulatory Visit: Payer: Self-pay | Admitting: Nurse Practitioner

## 2023-07-30 ENCOUNTER — Other Ambulatory Visit: Payer: Self-pay

## 2023-07-31 ENCOUNTER — Telehealth: Payer: Self-pay | Admitting: Hematology

## 2023-07-31 NOTE — Telephone Encounter (Signed)
 Scheduled appointments per WQ. Talked with the patients wife and she is aware of all made appointments.

## 2023-08-06 ENCOUNTER — Inpatient Hospital Stay

## 2023-08-06 VITALS — BP 152/83 | HR 56 | Temp 98.1°F | Resp 18 | Ht 65.0 in | Wt 169.1 lb

## 2023-08-06 DIAGNOSIS — C163 Malignant neoplasm of pyloric antrum: Secondary | ICD-10-CM

## 2023-08-06 DIAGNOSIS — D500 Iron deficiency anemia secondary to blood loss (chronic): Secondary | ICD-10-CM

## 2023-08-06 DIAGNOSIS — Z95828 Presence of other vascular implants and grafts: Secondary | ICD-10-CM

## 2023-08-06 DIAGNOSIS — Z5112 Encounter for antineoplastic immunotherapy: Secondary | ICD-10-CM | POA: Diagnosis not present

## 2023-08-06 LAB — CBC WITH DIFFERENTIAL (CANCER CENTER ONLY)
Abs Immature Granulocytes: 0.02 K/uL (ref 0.00–0.07)
Basophils Absolute: 0 K/uL (ref 0.0–0.1)
Basophils Relative: 1 %
Eosinophils Absolute: 0.1 K/uL (ref 0.0–0.5)
Eosinophils Relative: 2 %
HCT: 27.6 % — ABNORMAL LOW (ref 39.0–52.0)
Hemoglobin: 8.3 g/dL — ABNORMAL LOW (ref 13.0–17.0)
Immature Granulocytes: 0 %
Lymphocytes Relative: 25 %
Lymphs Abs: 1.3 K/uL (ref 0.7–4.0)
MCH: 25.3 pg — ABNORMAL LOW (ref 26.0–34.0)
MCHC: 30.1 g/dL (ref 30.0–36.0)
MCV: 84.1 fL (ref 80.0–100.0)
Monocytes Absolute: 0.8 K/uL (ref 0.1–1.0)
Monocytes Relative: 15 %
Neutro Abs: 2.9 K/uL (ref 1.7–7.7)
Neutrophils Relative %: 57 %
Platelet Count: 163 K/uL (ref 150–400)
RBC: 3.28 MIL/uL — ABNORMAL LOW (ref 4.22–5.81)
RDW: 22.7 % — ABNORMAL HIGH (ref 11.5–15.5)
WBC Count: 5.1 K/uL (ref 4.0–10.5)
nRBC: 0 % (ref 0.0–0.2)

## 2023-08-06 LAB — CMP (CANCER CENTER ONLY)
ALT: 13 U/L (ref 0–44)
AST: 23 U/L (ref 15–41)
Albumin: 4.1 g/dL (ref 3.5–5.0)
Alkaline Phosphatase: 87 U/L (ref 38–126)
Anion gap: 10 (ref 5–15)
BUN: 10 mg/dL (ref 6–20)
CO2: 42 mmol/L — ABNORMAL HIGH (ref 22–32)
Calcium: 9.3 mg/dL (ref 8.9–10.3)
Chloride: 106 mmol/L (ref 98–111)
Creatinine: 0.78 mg/dL (ref 0.61–1.24)
GFR, Estimated: >60 mL/min (ref >60)
Glucose, Bld: 110 mg/dL — ABNORMAL HIGH (ref 70–99)
Potassium: 4.5 mmol/L (ref 3.5–5.1)
Sodium: 139 mmol/L (ref 135–145)
Total Bilirubin: 0.4 mg/dL (ref 0.0–1.2)
Total Protein: 6.9 g/dL (ref 6.5–8.1)

## 2023-08-06 LAB — CEA (ACCESS): CEA (CHCC): 389.1 ng/mL — ABNORMAL HIGH (ref 0.00–5.00)

## 2023-08-06 LAB — TOTAL PROTEIN, URINE DIPSTICK: Protein, ur: 30 mg/dL — AB

## 2023-08-06 MED ORDER — SODIUM CHLORIDE 0.9 % IV SOLN: INTRAVENOUS | Status: DC

## 2023-08-06 MED ORDER — DIPHENHYDRAMINE HCL 50 MG/ML IJ SOLN
25.0000 mg | Freq: Once | INTRAMUSCULAR | Status: AC
  Administered 2023-08-06: 25 mg via INTRAVENOUS
  Filled 2023-08-06: qty 1

## 2023-08-06 MED ORDER — SODIUM CHLORIDE 0.9% FLUSH
10.0000 mL | Freq: Once | INTRAVENOUS | Status: AC
  Administered 2023-08-06: 10 mL via INTRAVENOUS

## 2023-08-06 MED ORDER — ACETAMINOPHEN 325 MG PO TABS
650.0000 mg | ORAL_TABLET | Freq: Once | ORAL | Status: AC
  Administered 2023-08-06: 650 mg via ORAL
  Filled 2023-08-06: qty 2

## 2023-08-06 MED ORDER — SODIUM CHLORIDE 0.9 % IV SOLN
8.0000 mg/kg | Freq: Once | INTRAVENOUS | Status: AC
  Administered 2023-08-06: 600 mg via INTRAVENOUS
  Filled 2023-08-06: qty 50

## 2023-08-06 MED ORDER — IRON SUCROSE 20 MG/ML IV SOLN
200.0000 mg | Freq: Once | INTRAVENOUS | Status: AC
  Administered 2023-08-06: 200 mg via INTRAVENOUS
  Filled 2023-08-06: qty 10

## 2023-08-06 MED ORDER — FAMOTIDINE IN NACL 20-0.9 MG/50ML-% IV SOLN
20.0000 mg | Freq: Once | INTRAVENOUS | Status: AC
  Administered 2023-08-06: 20 mg via INTRAVENOUS
  Filled 2023-08-06: qty 50

## 2023-08-06 MED ORDER — SODIUM CHLORIDE 0.9 % IV SOLN
80.0000 mg/m2 | Freq: Once | INTRAVENOUS | Status: AC
  Administered 2023-08-06: 150 mg via INTRAVENOUS
  Filled 2023-08-06: qty 25

## 2023-08-06 MED ORDER — SODIUM CHLORIDE 0.9 % IV SOLN
240.0000 mg | Freq: Once | INTRAVENOUS | Status: AC
  Administered 2023-08-06: 240 mg via INTRAVENOUS
  Filled 2023-08-06: qty 24

## 2023-08-06 NOTE — Patient Instructions (Signed)

## 2023-08-06 NOTE — Patient Instructions (Signed)
 [facility] - A DEPT OF [facility]  Discharge Instructions: Thank you for choosing [facility] to provide your oncology and hematology care.   If you have a lab appointment with the Cancer Center, please go directly to the Cancer Center and check in at the registration area.   Wear comfortable clothing and clothing appropriate for easy access to any Portacath or PICC line.   We strive to give you quality time with your provider. You may need to reschedule your appointment if you arrive late (15 or more minutes).  Arriving late affects you and other patients whose appointments are after yours.  Also, if you miss three or more appointments without notifying the office, you may be dismissed from the clinic at the provider's discretion.      For prescription refill requests, have your pharmacy contact our office and allow 72 hours for refills to be completed.    Today you received the following chemotherapy and/or immunotherapy agents cyramza , opdivo , and taxol       To help prevent nausea and vomiting after your treatment, we encourage you to take your nausea medication as directed.  BELOW ARE SYMPTOMS THAT SHOULD BE REPORTED IMMEDIATELY: *FEVER GREATER THAN 100.4 F (38 C) OR HIGHER *CHILLS OR SWEATING *NAUSEA AND VOMITING THAT IS NOT CONTROLLED WITH YOUR NAUSEA MEDICATION *UNUSUAL SHORTNESS OF BREATH *UNUSUAL BRUISING OR BLEEDING *URINARY PROBLEMS (pain or burning when urinating, or frequent urination) *BOWEL PROBLEMS (unusual diarrhea, constipation, pain near the anus) TENDERNESS IN MOUTH AND THROAT WITH OR WITHOUT PRESENCE OF ULCERS (sore throat, sores in mouth, or a toothache) UNUSUAL RASH, SWELLING OR PAIN  UNUSUAL VAGINAL DISCHARGE OR ITCHING   Items with * indicate a potential emergency and should be followed up as soon as possible or go to the Emergency Department if any problems should occur.  Please show the CHEMOTHERAPY ALERT CARD  or IMMUNOTHERAPY ALERT CARD at check-in to the Emergency Department and triage nurse.  Should you have questions after your visit or need to cancel or reschedule your appointment, please contact [facility] - A DEPT OF [facility]  Dept: [phone]  and follow the prompts.  Office hours are 8:00 a.m. to 4:30 p.m. Monday - Friday. Please note that voicemails left after 4:00 p.m. may not be returned until the following business day.  We are closed weekends and major holidays. You have access to a nurse at all times for urgent questions. Please call the main number to the clinic Dept: [phone] and follow the prompts.   For any non-urgent questions, you may also contact your provider using MyChart. We now offer e-Visits for anyone 15 and older to request care online for non-urgent symptoms. For details visit [email or web address].   Also download the MyChart app! Go to the app store, search MyChart, open the app, select [facility], and log in with your MyChart username and password.

## 2023-08-06 NOTE — Progress Notes (Signed)
 Patient refused 30 minute observation, VSS.

## 2023-08-15 ENCOUNTER — Other Ambulatory Visit: Payer: Self-pay

## 2023-08-20 ENCOUNTER — Inpatient Hospital Stay

## 2023-08-20 ENCOUNTER — Inpatient Hospital Stay: Attending: Physician Assistant | Admitting: Hematology

## 2023-08-20 ENCOUNTER — Encounter: Payer: Self-pay | Admitting: Hematology

## 2023-08-20 VITALS — BP 142/78 | HR 82 | Temp 98.7°F | Resp 17 | Ht 65.0 in | Wt 169.1 lb

## 2023-08-20 DIAGNOSIS — D500 Iron deficiency anemia secondary to blood loss (chronic): Secondary | ICD-10-CM

## 2023-08-20 DIAGNOSIS — Z79899 Other long term (current) drug therapy: Secondary | ICD-10-CM | POA: Diagnosis not present

## 2023-08-20 DIAGNOSIS — Z95828 Presence of other vascular implants and grafts: Secondary | ICD-10-CM

## 2023-08-20 DIAGNOSIS — D6481 Anemia due to antineoplastic chemotherapy: Secondary | ICD-10-CM | POA: Insufficient documentation

## 2023-08-20 DIAGNOSIS — Z5112 Encounter for antineoplastic immunotherapy: Secondary | ICD-10-CM | POA: Diagnosis present

## 2023-08-20 DIAGNOSIS — Z452 Encounter for adjustment and management of vascular access device: Secondary | ICD-10-CM | POA: Insufficient documentation

## 2023-08-20 DIAGNOSIS — Z79620 Long term (current) use of immunosuppressive biologic: Secondary | ICD-10-CM | POA: Insufficient documentation

## 2023-08-20 DIAGNOSIS — C163 Malignant neoplasm of pyloric antrum: Secondary | ICD-10-CM | POA: Insufficient documentation

## 2023-08-20 DIAGNOSIS — Z5111 Encounter for antineoplastic chemotherapy: Secondary | ICD-10-CM | POA: Diagnosis present

## 2023-08-20 DIAGNOSIS — Z7952 Long term (current) use of systemic steroids: Secondary | ICD-10-CM | POA: Insufficient documentation

## 2023-08-20 DIAGNOSIS — G620 Drug-induced polyneuropathy: Secondary | ICD-10-CM | POA: Diagnosis not present

## 2023-08-20 DIAGNOSIS — T451X5D Adverse effect of antineoplastic and immunosuppressive drugs, subsequent encounter: Secondary | ICD-10-CM | POA: Insufficient documentation

## 2023-08-20 DIAGNOSIS — E278 Other specified disorders of adrenal gland: Secondary | ICD-10-CM | POA: Insufficient documentation

## 2023-08-20 LAB — CMP (CANCER CENTER ONLY)
ALT: 13 U/L (ref 0–44)
AST: 25 U/L (ref 15–41)
Albumin: 4 g/dL (ref 3.5–5.0)
Alkaline Phosphatase: 94 U/L (ref 38–126)
Anion gap: 6 (ref 5–15)
BUN: 8 mg/dL (ref 6–20)
CO2: 25 mmol/L (ref 22–32)
Calcium: 8.6 mg/dL — ABNORMAL LOW (ref 8.9–10.3)
Chloride: 107 mmol/L (ref 98–111)
Creatinine: 0.73 mg/dL (ref 0.61–1.24)
GFR, Estimated: >60 mL/min (ref >60)
Glucose, Bld: 124 mg/dL — ABNORMAL HIGH (ref 70–99)
Potassium: 3.7 mmol/L (ref 3.5–5.1)
Sodium: 138 mmol/L (ref 135–145)
Total Bilirubin: 0.5 mg/dL (ref 0.0–1.2)
Total Protein: 6.9 g/dL (ref 6.5–8.1)

## 2023-08-20 LAB — CBC WITH DIFFERENTIAL (CANCER CENTER ONLY)
Abs Immature Granulocytes: 0.02 K/uL (ref 0.00–0.07)
Basophils Absolute: 0 K/uL (ref 0.0–0.1)
Basophils Relative: 1 %
Eosinophils Absolute: 0.1 K/uL (ref 0.0–0.5)
Eosinophils Relative: 2 %
HCT: 24.8 % — ABNORMAL LOW (ref 39.0–52.0)
Hemoglobin: 7.8 g/dL — ABNORMAL LOW (ref 13.0–17.0)
Immature Granulocytes: 0 %
Lymphocytes Relative: 27 %
Lymphs Abs: 1.4 K/uL (ref 0.7–4.0)
MCH: 26.2 pg (ref 26.0–34.0)
MCHC: 31.5 g/dL (ref 30.0–36.0)
MCV: 83.2 fL (ref 80.0–100.0)
Monocytes Absolute: 0.7 K/uL (ref 0.1–1.0)
Monocytes Relative: 13 %
Neutro Abs: 3 K/uL (ref 1.7–7.7)
Neutrophils Relative %: 57 %
Platelet Count: 153 K/uL (ref 150–400)
RBC: 2.98 MIL/uL — ABNORMAL LOW (ref 4.22–5.81)
RDW: 23.8 % — ABNORMAL HIGH (ref 11.5–15.5)
WBC Count: 5.2 K/uL (ref 4.0–10.5)
nRBC: 0 % (ref 0.0–0.2)

## 2023-08-20 LAB — TSH: TSH: 2.39 u[IU]/mL (ref 0.350–4.500)

## 2023-08-20 LAB — TOTAL PROTEIN, URINE DIPSTICK: Protein, ur: 100 mg/dL — AB

## 2023-08-20 LAB — CEA (ACCESS): CEA (CHCC): 450.99 ng/mL — ABNORMAL HIGH (ref 0.00–5.00)

## 2023-08-20 LAB — FERRITIN: Ferritin: 123 ng/mL (ref 24–336)

## 2023-08-20 MED ORDER — SODIUM CHLORIDE 0.9% FLUSH
10.0000 mL | Freq: Once | INTRAVENOUS | Status: AC
Start: 2023-08-20 — End: 2023-08-20
  Administered 2023-08-20: 10 mL

## 2023-08-20 MED ORDER — SODIUM CHLORIDE 0.9 % IV SOLN
240.0000 mg | Freq: Once | INTRAVENOUS | Status: AC
  Administered 2023-08-20: 240 mg via INTRAVENOUS
  Filled 2023-08-20: qty 24

## 2023-08-20 MED ORDER — SODIUM CHLORIDE 0.9 % IV SOLN
8.0000 mg/kg | Freq: Once | INTRAVENOUS | Status: AC
  Administered 2023-08-20: 600 mg via INTRAVENOUS
  Filled 2023-08-20: qty 10

## 2023-08-20 MED ORDER — FAMOTIDINE IN NACL 20-0.9 MG/50ML-% IV SOLN
20.0000 mg | Freq: Once | INTRAVENOUS | Status: AC
  Administered 2023-08-20: 20 mg via INTRAVENOUS
  Filled 2023-08-20: qty 50

## 2023-08-20 MED ORDER — ACETAMINOPHEN 325 MG PO TABS
650.0000 mg | ORAL_TABLET | Freq: Once | ORAL | Status: AC
  Administered 2023-08-20: 650 mg via ORAL
  Filled 2023-08-20: qty 2

## 2023-08-20 MED ORDER — SODIUM CHLORIDE 0.9 % IV SOLN: INTRAVENOUS | Status: DC

## 2023-08-20 MED ORDER — IRON SUCROSE 20 MG/ML IV SOLN
200.0000 mg | Freq: Once | INTRAVENOUS | Status: AC
  Administered 2023-08-20: 200 mg via INTRAVENOUS
  Filled 2023-08-20: qty 10

## 2023-08-20 MED ORDER — SODIUM CHLORIDE 0.9% FLUSH: 10.0000 mL | INTRAVENOUS | Status: DC | PRN

## 2023-08-20 MED ORDER — SODIUM CHLORIDE 0.9 % IV SOLN
80.0000 mg/m2 | Freq: Once | INTRAVENOUS | Status: AC
  Administered 2023-08-20: 150 mg via INTRAVENOUS
  Filled 2023-08-20: qty 25

## 2023-08-20 MED ORDER — DIPHENHYDRAMINE HCL 50 MG/ML IJ SOLN
25.0000 mg | Freq: Once | INTRAMUSCULAR | Status: AC
  Administered 2023-08-20: 25 mg via INTRAVENOUS
  Filled 2023-08-20: qty 1

## 2023-08-20 NOTE — Patient Instructions (Signed)
 [facility] - A DEPT OF [facility]  Discharge Instructions: Thank you for choosing [facility] to provide your oncology and hematology care.   If you have a lab appointment with the Cancer Center, please go directly to the Cancer Center and check in at the registration area.   Wear comfortable clothing and clothing appropriate for easy access to any Portacath or PICC line.   We strive to give you quality time with your provider. You may need to reschedule your appointment if you arrive late (15 or more minutes).  Arriving late affects you and other patients whose appointments are after yours.  Also, if you miss three or more appointments without notifying the office, you may be dismissed from the clinic at the provider's discretion.      For prescription refill requests, have your pharmacy contact our office and allow 72 hours for refills to be completed.    Today you received the following chemotherapy and/or immunotherapy agents cyramza , opdivo , and taxol       To help prevent nausea and vomiting after your treatment, we encourage you to take your nausea medication as directed.  BELOW ARE SYMPTOMS THAT SHOULD BE REPORTED IMMEDIATELY: *FEVER GREATER THAN 100.4 F (38 C) OR HIGHER *CHILLS OR SWEATING *NAUSEA AND VOMITING THAT IS NOT CONTROLLED WITH YOUR NAUSEA MEDICATION *UNUSUAL SHORTNESS OF BREATH *UNUSUAL BRUISING OR BLEEDING *URINARY PROBLEMS (pain or burning when urinating, or frequent urination) *BOWEL PROBLEMS (unusual diarrhea, constipation, pain near the anus) TENDERNESS IN MOUTH AND THROAT WITH OR WITHOUT PRESENCE OF ULCERS (sore throat, sores in mouth, or a toothache) UNUSUAL RASH, SWELLING OR PAIN  UNUSUAL VAGINAL DISCHARGE OR ITCHING   Items with * indicate a potential emergency and should be followed up as soon as possible or go to the Emergency Department if any problems should occur.  Please show the CHEMOTHERAPY ALERT CARD  or IMMUNOTHERAPY ALERT CARD at check-in to the Emergency Department and triage nurse.  Should you have questions after your visit or need to cancel or reschedule your appointment, please contact [facility] - [facility] DE[facility]  Dept: [phone]  and follow the prompts.  Office hours are 8:00 a.m. to 4:30 p.m. Monday - Friday. Please note that voicemails left after 4:00 p.m. may not be returned until the following business day.  We are closed weekends and major holidays. You have access to a nurse at all times for urgent questions. Please call the main number to the clinic Dept: [phone] and follow the prompts.   For any non-urgent questions, you may also contact your provider using MyChart. We now offer e-Visits for anyone 26 and older to request care online for non-urgent symptoms. For details visit [email or web address].   Also download the MyChart app! Go to the app store, search MyChart, open the app, select [place], and log in with your MyChart username and password.

## 2023-08-20 NOTE — Progress Notes (Signed)
 [facility]   Telephone:([phone] Fax:([phone]   Clinic Follow up Note   Patient Care Team: Pcp, No as PCP - General [name], MD as Consulting Physician (Hematology and Oncology)  Date of Service:  08/20/2023  CHIEF COMPLAINT: f/u of gastric cancer  CURRENT THERAPY:  Paclitaxel , ramucirumab  and nivolumab  every 2 weeks  Oncology History   Gastric cancer (HCC)  cT3N3Mx, with hypermetabolic left supraclavicular node, and indeterminate adrenal nodule, MMR normal ypT3ypN3a, liver and nodes recurrence in 07/2022 -diagnosed 10/18/21 by EGD for 6 month f/u of gastric ulcers, showed mucosal intramucosal adenocarcinoma. MMR normal. -baseline CEA 11/01/21 significantly elevated at 1,989.58. -EUS on 11/06/21 by Dr. Burnette, staged as T3 N3 (at least stage III) -staging CT CAP 11/08/21 showed: stable gastric antrum wall thickening; increased size of upper abdominal lymph nodes; stable left adrenal nodule, 1.9 cm. -PET scan showed FDG avid lymph nodes within the gastrohepatic ligament, portacaval region, and mesentery. Tracer avid left supraclavicular lymph node and indeterminate left adrenal gland nodule with mild FDG uptake. -he underwent [place] node biopsy on 1/17 which was negative for malignant cells, but no lymphoid tissue seen on biopsy  -repeated PET on 02/07/22 after 3 months neoadjuvant chemo showed resolved left supraclavicular lymph node, and significant improvement in regional lymph nodes and the primary tumor, stable and indeterminate left adrenal nodule.  -CT adrenal protocol showed the left adrenal nodule is likely benign adenoma, no biopsy is needed. -Completed neoadjvant chemo FLOT s/p 12 cycles 11/22/21 - 04/27/2022 -restaging PET on 05/16/22 showed significantly hypermetabolic activity at the primary gastric cancer in pylorus, and a small hypermetabolic mesenteric lymph node, no other evidence of metastasis. surgeon Dr. Stevie aware of PET findings -S/p distal  gastrectomy 05/20/22, path showed ypT3N3a with clear margins, 7/18 + LNs, and lymphovascular invasion identified. There was some treatment effect in a few lymph nodes nut not in the primary tumor -due to rising tumor marker, he underwent PET scan on July 07, 2022, which unfortunately showed hypermetabolic new adenopathy in right hilar and mesentery, diffuse liver metastasis, consistent with metastatic recurrence.  I personally reviewed the PET scan images with patient -Unfortunately his disease is not curable at this stage. -The goal of chemotherapy is palliative, to prolong his life -I discussed his next generation sequencing foundation one result, which showed EGFR amplification, no other targetable mutations.  His tumor was previously tested for HER2 which was negative (0-1+).  PD-L1 CPS score was 2%, no significant benefit of immunotherapy.  -He started paclitaxel  and ramucirumab  on 07/25/2022  He developed worsening neuropathy quickly, and I had to switch his treatment to FOLFIRI on 08/29/2022, every 2 weeks, he is overall tolerating well. -restaging CT 01/19/2023 showed disease progression with new pathologic right paratracheal mediastinal lymph nodes as well as increasing size of several liver metastases. -I changed his chemo to taxol , ramucizumab and Nivo on 02/05/2023, every 2 weeks per his request  -restaging CT 04/08/2023 showed partial response in liver and mediastinal nodes  -restaging PET 07/24/2023 showed mixed response comparing to PET in 07/2022 and CT in 04/2023, with significantly improvement in liver mets and slightly progression in nodes and left adrenal glad metastasis -will continue paclitaxel , ramucirumab  and Nivo every 2 weeks  Assessment & Plan Gastric cancer on active treatment Gastric cancer is being actively treated with immunotherapy (nivolumab ) and romosozumab. No new side effects reported from the treatment. 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 Previous blood transfusions have been administered, but he reports no significant change in symptoms post-transfusion. Iron  levels from July were borderline low, and further decline may necessitate IV iron  supplementation. He prefers to wait and see regarding further blood transfusions. 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 No new pain or nausea reported. - Continue Tylenol  and gabapentin  for pain management - Refill gabapentin  prescription as needed  Plan - Lab reviewed, adequate for treatment, will proceed with treatment today and continue every 2 weeks - He is not symptomatic from his anemia, hemoglobin 7.8 today, will hold on blood transfusion.  Will continue Venofer  200 mg today and every 2 weeks for 2 more treatments. - Follow-up in 2 weeks    SUMMARY OF ONCOLOGIC HISTORY: Oncology History Overview Note   Cancer Staging  Gastric cancer [facility]) Staging form: Stomach, AJCC 8th Edition - Clinical stage from 10/18/2021: Stage III (cT3, cN3a, cM0) - Signed by [name], MD on 11/10/2021     Gastric cancer (HCC)  10/18/2021 Procedure   EGD performed under the care of Dr. Dianna  Findings:  -A large, ulcerated, partially circumferential (involving two thirds of the lumen circumference) mass with no bleeding and stigmata of recent bleeding was found in the gastric antrum, at the pylorus and in the prepyloric region of the stomach. -Segmental severe inflammation characterized by congestion (edema) and erythema was found in the gastric antrum.   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 Negative for dysplasia.  Negative for helicobacter pylori  Mismatch Repair (MMR) Protein Imunohistochemistry (IHC): IHC Expression Result: MLH1: Preserved nuclear expression. MSH2: Preserved nuclear expression. MSH6: Preserved nuclear expression. PMS2: Preserved nuclear expression. Interpretation: NORMAL   10/31/2021 Initial Diagnosis   Gastric cancer (HCC)   11/01/2021 Tumor Marker   CEA = 1,989.58 (^)   11/06/2021 Procedure   Upper EUS, Dr. Burnette  Impression: - Normal esophagus. - A small amount of food (residue) in the stomach. - Congested, friable (with contact bleeding) and ulcerated mucosa in the prepyloric region of the stomach. - Normal examined duodenum. - There was no evidence of significant pathology in the left lobe of the liver. - Many abnormal lymph nodes (over 10) were visualized in the gastrohepatic ligament (level 18), celiac region (level 20), perigastric region, peripancreatic region and aortocaval region. - Wall thickening was seen in the prepyloric region of the stomach. The thickening appeared to be primarily within the deep mucosa (Layer 2) but extended through the muscularis propria. - Overall constellation of findings consistent with at least T3 N3 Mx (at least stage III) gastric adenocarcinoma.   11/08/2021 Imaging   EXAM: CT CHEST, ABDOMEN, AND PELVIS WITH CONTRAST  IMPRESSION: 1. Similar irregular wall thickening of the gastric antrum, consistent with patient's known primary gastric neoplasm. 2.  Increased size  of the upper abdominal lymph nodes, concerning for worsening nodal disease involvement. 3. Stable left adrenal nodule common nonspecific possibly a metastatic lesion or adenoma. 4. No evidence of metastatic disease in the chest. 5. Left-sided colonic diverticulosis without findings of acute diverticulitis. 6. Enlarged prostate gland. 7.  Aortic Atherosclerosis (ICD10-I70.0).   11/22/2021 - 04/26/2022 Chemotherapy   Patient is on Treatment Plan : GASTROESOPHAGEAL FLOT q14d X 4 cycles     11/27/2021 Imaging    IMPRESSION: 1. The mass within the gastric antrum is mildly decreased in size when compared with 11/08/2021. There is persistent FDG uptake associated with this mass compatible with residual tumor. 2. Persistent FDG avid lymph nodes within the gastrohepatic ligament, portacaval region, and mesentery. These are mildly decreased in size when compared with 11/08/2021. 3. Tracer avid left supraclavicular lymph node is also mildly decreased in size when compared with 11/08/2021. 4. Indeterminate left adrenal gland nodule with mild FDG uptake. This may represent a lipid poor adenoma. Metastatic disease not exclude. 5. Diffuse increased uptake throughout the bone marrow is favored to represent treatment related change. 6.  Aortic Atherosclerosis (ICD10-I70.0).     02/07/2022 Imaging    IMPRESSION: 1. No substantial change in hypermetabolism associated with the distal gastric lesion. Although difficult to discern on noncontrast CT imaging, the soft tissue component appears to be decreased since the prior PET-CT. 2. Interval resolution of the hypermetabolic left supraclavicular lymph node. 3. Interval marked decrease of the hypermetabolic gastrohepatic ligament lymphadenopathy. Similar decrease in size and hypermetabolism associated with index nodes identified previously in the 4. porta hepatis and hypogastric region. 5. Stable left adrenal nodule with slight  hypermetabolism. Nonspecific finding could represent lipid poor adenoma or metastatic deposit. 6. No new suspicious hypermetabolic disease on today's study. 7.  Aortic Atherosclerosis (ICD10-I70.0).   07/25/2022 - 08/15/2022 Chemotherapy   Patient is on Treatment Plan : GASTROESOPHAGEAL Ramucirumab  D1, 15 + Paclitaxel  D1,8,15 q28d     08/29/2022 - 01/24/2023 Chemotherapy   Patient is on Treatment Plan : COLORECTAL FOLFIRI q14d     02/05/2023 -  Chemotherapy   Patient is on Treatment Plan : GASTROESOPHAGEAL Ramucirumab  D1, 15 + Paclitaxel  D1,8,15 q28d        Discussed the use of AI scribe software for clinical note transcription with the patient, who gave verbal consent to proceed.  History of Present Illness Jonathan Maynard is a 53 year old male with gastric cancer who presents for follow-up.  He is undergoing treatment with nivolumab , romosozumab, and Paxil without new side effects. He experiences no new stomach pain or nausea.  His hemoglobin level is 7.8, and he has required multiple blood transfusions in the past. Despite low blood counts, he does not feel tired. Iron  levels were noted to be decreasing in July.  He takes Tylenol  and gabapentin  for neuropathic pain and has enough gabapentin  for now. No sleep disturbances related to pain are reported.     All other systems were reviewed with the patient and are negative.  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    SURGICAL HISTORY: Past Surgical History:  Procedure Laterality Date   BIOPSY  04/12/2021   Procedure: BIOPSY;  Surgeon: Dianna Specking, MD;  Location: [facility];  Service: Gastroenterology;;    ESOPHAGOGASTRODUODENOSCOPY N/A 04/12/2021   Procedure: ESOPHAGOGASTRODUODENOSCOPY (EGD);  Surgeon: Dianna Specking, MD;  Location: [facility];  Service: Gastroenterology;  Laterality: N/A;   ESOPHAGOGASTRODUODENOSCOPY N/A 11/06/2021   Procedure: ESOPHAGOGASTRODUODENOSCOPY (EGD);  Surgeon: Burnette Fallow, MD;  Location: [facility];  Service: Gastroenterology;  Laterality: N/A;   GASTRECTOMY N/A 05/20/2022   Procedure: OPEN PARTIAL GASTRECTOMY WITH GASTROJEJUNAL ANASTOMOSIS;  Surgeon: Stevie, Herlene Righter, MD;  Location: [facility];  Service: General;  Laterality: N/A;   HERNIA REPAIR Left    inguinal   IR IMAGING GUIDED PORT INSERTION  11/15/2021   LAPAROSCOPY N/A 05/20/2022   Procedure: LAPAROSCOPY DIAGNOSTIC;  Surgeon: Stevie Herlene Righter, MD;  Location: [facility];  Service: General;  Laterality: N/A;   UPPER ESOPHAGEAL ENDOSCOPIC ULTRASOUND (EUS) Bilateral 11/06/2021   Procedure: UPPER ESOPHAGEAL ENDOSCOPIC ULTRASOUND (EUS);  Surgeon: Burnette Fallow, MD;  Location: [facility];  Service: Gastroenterology;  Laterality: Bilateral;    I have reviewed the social history and family history with the patient and they are unchanged from previous note.  ALLERGIES:  has no known allergies.  MEDICATIONS:  Current Outpatient Medications  Medication Sig Dispense Refill   acetaminophen  (TYLENOL ) 500 MG tablet Take 1 tablet (500 mg total) by mouth 2 (two) times daily. 60 tablet 3   dexamethasone  (DECADRON ) 4 MG tablet Take 1 tablet (4 mg total) by mouth daily. Start the day after chemotherapy for 1-3 days.  Take with food. 30 tablet 1   diclofenac  Sodium (VOLTAREN  ARTHRITIS PAIN) 1 % GEL Apply 4 g topically 4 (four) times daily. 100 g 3   gabapentin  (NEURONTIN ) 600 MG tablet Take 2 tablets (1,200 mg total) by mouth 3 (three) times daily. 180 tablet 1   magic mouthwash (nystatin , lidocaine , diphenhydrAMINE , alum & mag hydroxide) suspension Swish for 5 seconds and swallow 5 mLs by mouth 3 (three) times  daily as needed for mouth pain. 140 mL 0   ondansetron  (ZOFRAN -ODT) 4 MG disintegrating tablet Dissolve 1 tablet (4 mg total) by mouth every 6 (six) hours as needed for nausea or vomiting. 30 tablet 1   pantoprazole  (PROTONIX ) 40 MG tablet Take 1 tablet (40 mg) by mouth 2 times daily. 60 tablet 6   prochlorperazine  (COMPAZINE ) 10 MG tablet Take 1 tablet (10 mg total) by mouth every 6 (six) hours as needed for nausea or vomiting. 30 tablet 2   No current facility-administered medications for this visit.   Facility-Administered Medications Ordered in Other Visits  Medication Dose Route Frequency Provider Last Rate Last Admin   0.9 %  sodium chloride  infusion   Intravenous Continuous [name], MD 10 mL/hr at 08/20/23 0928 New Bag at 08/20/23 0928   nivolumab  (OPDIVO ) 240 mg in sodium chloride  0.9 % 100 mL chemo infusion  240 mg Intravenous Once [name], MD 248 mL/hr at 08/20/23 1007 240 mg at 08/20/23 1007   PACLitaxel  (TAXOL ) 150 mg in sodium chloride  0.9 % 250 mL chemo infusion (</= 80mg /m2)  80 mg/m2 (Treatment Plan Recorded) Intravenous Once [name], MD       ramucirumab  (CYRAMZA ) 600 mg in sodium chloride  0.9 % 190 mL chemo infusion  8 mg/kg (Treatment Plan Recorded) Intravenous Once [name], MD       sodium chloride  flush (NS) 0.9 % injection 10 mL  10 mL Intracatheter PRN [name], MD        PHYSICAL EXAMINATION: ECOG PERFORMANCE STATUS: 1 - Symptomatic but completely ambulatory  Vitals:   08/20/23 0825 08/20/23 0827  BP: (!) 150/82 (!) 142/78  Pulse: 81 82  Resp: 17   Temp: 98.7 F (37.1 C)   SpO2: 100% 99%   Wt Readings from Last 3 Encounters:  08/20/23 169 lb 1.6 oz (76.7 kg)  08/06/23 169 lb 1.6 oz (76.7 kg)  07/23/23 169 lb 7.7 oz (76.9 kg)     GENERAL:alert, no distress and comfortable SKIN: skin color, texture, turgor are normal, no rashes or significant lesions EYES: normal, Conjunctiva are pink and non-injected, sclera clear NECK: supple, thyroid  normal size,  non-tender, without nodularity LYMPH:  no palpable lymphadenopathy in the cervical, axillary  LUNGS: clear to auscultation and percussion with normal breathing effort HEART: regular rate & rhythm and no murmurs and no lower extremity edema ABDOMEN:abdomen soft, non-tender and normal bowel sounds Musculoskeletal:no cyanosis of digits and no clubbing  NEURO: alert & oriented x 3 with fluent speech, no focal motor/sensory deficits  Physical Exam    LABORATORY DATA:  I have reviewed the data as listed    Latest Ref Rng & Units 08/20/2023    8:08 AM 08/06/2023    8:16 AM 07/23/2023    7:46 AM  CBC  WBC 4.0 - 10.5 K/uL 5.2  5.1  4.5   Hemoglobin 13.0 - 17.0 g/dL 7.8  8.3  7.7   Hematocrit 39.0 - 52.0 % 24.8  27.6  24.7   Platelets 150 - 400 K/uL 153  163  176         Latest Ref Rng & Units 08/20/2023    8:08 AM 08/06/2023    8:16 AM 07/23/2023    7:46 AM  CMP  Glucose 70 - 99 mg/dL 875  889  895   BUN 6 - 20 mg/dL 8  10  7    Creatinine 0.61 - 1.24 mg/dL 9.26  9.21  9.21   Sodium 135 - 145 mmol/L 138  139  140   Potassium 3.5 - 5.1 mmol/L 3.7  4.5  4.4   Chloride 98 - 111 mmol/L 107  106  108   CO2 22 - 32 mmol/L 25  42  28   Calcium  8.9 - 10.3 mg/dL 8.6  9.3  8.9   Total Protein 6.5 - 8.1 g/dL 6.9  6.9  7.0   Total Bilirubin 0.0 - 1.2 mg/dL 0.5  0.4  0.5   Alkaline Phos 38 - 126 U/L 94  87  102   AST 15 - 41 U/L 25  23  21    ALT 0 - 44 U/L 13  13  10        RADIOGRAPHIC STUDIES: I have personally reviewed the radiological images as listed and agreed with the findings in the report. No results found.    No orders of the defined types were placed in this encounter.  All questions were answered. The patient knows to call the clinic with any problems, questions or concerns. No barriers to learning was detected. The total time spent in the appointment was 25 minutes, including review of chart and various tests results, discussions about plan of care and coordination of care  plan     Onita Mattock, MD 08/20/2023

## 2023-08-20 NOTE — Assessment & Plan Note (Addendum)
  cT3N3Mx, with hypermetabolic left supraclavicular node, and indeterminate adrenal nodule, MMR normal ypT3ypN3a, liver and nodes recurrence in 07/2022 -diagnosed 10/18/21 by EGD for 6 month f/u of gastric ulcers, showed mucosal intramucosal adenocarcinoma. MMR normal. -baseline CEA 11/01/21 significantly elevated at 1,989.58. -EUS on 11/06/21 by Dr. Burnette, staged as T3 N3 (at least stage III) -staging CT CAP 11/08/21 showed: stable gastric antrum wall thickening; increased size of upper abdominal lymph nodes; stable left adrenal nodule, 1.9 cm. -PET scan showed FDG avid lymph nodes within the gastrohepatic ligament, portacaval region, and mesentery. Tracer avid left supraclavicular lymph node and indeterminate left adrenal gland nodule with mild FDG uptake. -he underwent [place] node biopsy on 1/17 which was negative for malignant cells, but no lymphoid tissue seen on biopsy  -repeated PET on 02/07/22 after 3 months neoadjuvant chemo showed resolved left supraclavicular lymph node, and significant improvement in regional lymph nodes and the primary tumor, stable and indeterminate left adrenal nodule.  -CT adrenal protocol showed the left adrenal nodule is likely benign adenoma, no biopsy is needed. -Completed neoadjvant chemo FLOT s/p 12 cycles 11/22/21 - 04/27/2022 -restaging PET on 05/16/22 showed significantly hypermetabolic activity at the primary gastric cancer in pylorus, and a small hypermetabolic mesenteric lymph node, no other evidence of metastasis. surgeon Dr. Stevie aware of PET findings -S/p distal gastrectomy 05/20/22, path showed ypT3N3a with clear margins, 7/18 + LNs, and lymphovascular invasion identified. There was some treatment effect in a few lymph nodes nut not in the primary tumor -due to rising tumor marker, he underwent PET scan on July 07, 2022, which unfortunately showed hypermetabolic new adenopathy in right hilar and mesentery, diffuse liver metastasis, consistent with metastatic  recurrence.  I personally reviewed the PET scan images with patient -Unfortunately his disease is not curable at this stage. -The goal of chemotherapy is palliative, to prolong his life -I discussed his next generation sequencing foundation one result, which showed EGFR amplification, no other targetable mutations.  His tumor was previously tested for HER2 which was negative (0-1+).  PD-L1 CPS score was 2%, no significant benefit of immunotherapy.  -He started paclitaxel  and ramucirumab  on 07/25/2022  He developed worsening neuropathy quickly, and I had to switch his treatment to FOLFIRI on 08/29/2022, every 2 weeks, he is overall tolerating well. -restaging CT 01/19/2023 showed disease progression with new pathologic right paratracheal mediastinal lymph nodes as well as increasing size of several liver metastases. -I changed his chemo to taxol , ramucizumab and Nivo on 02/05/2023, every 2 weeks per his request  -restaging CT 04/08/2023 showed partial response in liver and mediastinal nodes  -restaging PET 07/24/2023 showed mixed response comparing to PET in 07/2022 and CT in 04/2023, with significantly improvement in liver mets and slightly progression in nodes and left adrenal glad metastasis -will continue paclitaxel , ramucirumab  and Nivo every 2 weeks

## 2023-08-21 ENCOUNTER — Telehealth: Payer: Self-pay

## 2023-08-21 LAB — T4: T4, Total: 7.7 ug/dL (ref 4.5–12.0)

## 2023-08-21 NOTE — Telephone Encounter (Signed)
 Notified the pt regarding his completed FMLA forms being completed,faxed,and confirmation received. Pt verbalized understanding. Pt hardcopy mailed to address as requested. No question or concerns to be noted.

## 2023-08-24 ENCOUNTER — Ambulatory Visit: Payer: Self-pay | Admitting: Nurse Practitioner

## 2023-09-02 NOTE — Assessment & Plan Note (Signed)
  cT3N3Mx, with hypermetabolic left supraclavicular node, and indeterminate adrenal nodule, MMR normal ypT3ypN3a, liver and nodes recurrence in 07/2022 -diagnosed 10/18/21 by EGD for 6 month f/u of gastric ulcers, showed mucosal intramucosal adenocarcinoma. MMR normal. -baseline CEA 11/01/21 significantly elevated at 1,989.58. -EUS on 11/06/21 by Dr. Burnette, staged as T3 N3 (at least stage III) -staging CT CAP 11/08/21 showed: stable gastric antrum wall thickening; increased size of upper abdominal lymph nodes; stable left adrenal nodule, 1.9 cm. -PET scan showed FDG avid lymph nodes within the gastrohepatic ligament, portacaval region, and mesentery. Tracer avid left supraclavicular lymph node and indeterminate left adrenal gland nodule with mild FDG uptake. -he underwent [place] node biopsy on 1/17 which was negative for malignant cells, but no lymphoid tissue seen on biopsy  -repeated PET on 02/07/22 after 3 months neoadjuvant chemo showed resolved left supraclavicular lymph node, and significant improvement in regional lymph nodes and the primary tumor, stable and indeterminate left adrenal nodule.  -CT adrenal protocol showed the left adrenal nodule is likely benign adenoma, no biopsy is needed. -Completed neoadjvant chemo FLOT s/p 12 cycles 11/22/21 - 04/27/2022 -restaging PET on 05/16/22 showed significantly hypermetabolic activity at the primary gastric cancer in pylorus, and a small hypermetabolic mesenteric lymph node, no other evidence of metastasis. surgeon Dr. Stevie aware of PET findings -S/p distal gastrectomy 05/20/22, path showed ypT3N3a with clear margins, 7/18 + LNs, and lymphovascular invasion identified. There was some treatment effect in a few lymph nodes nut not in the primary tumor -due to rising tumor marker, he underwent PET scan on July 07, 2022, which unfortunately showed hypermetabolic new adenopathy in right hilar and mesentery, diffuse liver metastasis, consistent with metastatic  recurrence.  I personally reviewed the PET scan images with patient -Unfortunately his disease is not curable at this stage. -The goal of chemotherapy is palliative, to prolong his life -I discussed his next generation sequencing foundation one result, which showed EGFR amplification, no other targetable mutations.  His tumor was previously tested for HER2 which was negative (0-1+).  PD-L1 CPS score was 2%, no significant benefit of immunotherapy.  -He started paclitaxel  and ramucirumab  on 07/25/2022  He developed worsening neuropathy quickly, and I had to switch his treatment to FOLFIRI on 08/29/2022, every 2 weeks, he is overall tolerating well. -restaging CT 01/19/2023 showed disease progression with new pathologic right paratracheal mediastinal lymph nodes as well as increasing size of several liver metastases. -I changed his chemo to taxol , ramucizumab and Nivo on 02/05/2023, every 2 weeks per his request  -restaging CT 04/08/2023 showed partial response in liver and mediastinal nodes  -restaging PET 07/24/2023 showed mixed response comparing to PET in 07/2022 and CT in 04/2023, with significantly improvement in liver mets and slightly progression in nodes and left adrenal glad metastasis -will continue paclitaxel , ramucirumab  and Nivo every 2 weeks

## 2023-09-03 ENCOUNTER — Inpatient Hospital Stay (HOSPITAL_BASED_OUTPATIENT_CLINIC_OR_DEPARTMENT_OTHER): Admitting: Hematology

## 2023-09-03 ENCOUNTER — Encounter: Payer: Self-pay | Admitting: Hematology

## 2023-09-03 ENCOUNTER — Inpatient Hospital Stay

## 2023-09-03 VITALS — BP 114/62 | HR 89 | Temp 99.4°F | Resp 16 | Ht 65.0 in | Wt 163.9 lb

## 2023-09-03 DIAGNOSIS — C163 Malignant neoplasm of pyloric antrum: Secondary | ICD-10-CM

## 2023-09-03 DIAGNOSIS — Z5112 Encounter for antineoplastic immunotherapy: Secondary | ICD-10-CM | POA: Diagnosis not present

## 2023-09-03 DIAGNOSIS — D500 Iron deficiency anemia secondary to blood loss (chronic): Secondary | ICD-10-CM

## 2023-09-03 DIAGNOSIS — Z95828 Presence of other vascular implants and grafts: Secondary | ICD-10-CM

## 2023-09-03 LAB — CBC WITH DIFFERENTIAL (CANCER CENTER ONLY)
Abs Immature Granulocytes: 0.02 K/uL (ref 0.00–0.07)
Basophils Absolute: 0.1 K/uL (ref 0.0–0.1)
Basophils Relative: 1 %
Eosinophils Absolute: 0.1 K/uL (ref 0.0–0.5)
Eosinophils Relative: 2 %
HCT: 26.1 % — ABNORMAL LOW (ref 39.0–52.0)
Hemoglobin: 8 g/dL — ABNORMAL LOW (ref 13.0–17.0)
Immature Granulocytes: 0 %
Lymphocytes Relative: 27 %
Lymphs Abs: 1.4 K/uL (ref 0.7–4.0)
MCH: 26.1 pg (ref 26.0–34.0)
MCHC: 30.7 g/dL (ref 30.0–36.0)
MCV: 85 fL (ref 80.0–100.0)
Monocytes Absolute: 0.7 K/uL (ref 0.1–1.0)
Monocytes Relative: 13 %
Neutro Abs: 2.8 K/uL (ref 1.7–7.7)
Neutrophils Relative %: 57 %
Platelet Count: 155 K/uL (ref 150–400)
RBC: 3.07 MIL/uL — ABNORMAL LOW (ref 4.22–5.81)
RDW: 24.5 % — ABNORMAL HIGH (ref 11.5–15.5)
WBC Count: 5 K/uL (ref 4.0–10.5)
nRBC: 0 % (ref 0.0–0.2)

## 2023-09-03 LAB — CMP (CANCER CENTER ONLY)
ALT: 13 U/L (ref 0–44)
AST: 27 U/L (ref 15–41)
Albumin: 4.1 g/dL (ref 3.5–5.0)
Alkaline Phosphatase: 85 U/L (ref 38–126)
Anion gap: 3 — ABNORMAL LOW (ref 5–15)
BUN: 15 mg/dL (ref 6–20)
CO2: 27 mmol/L (ref 22–32)
Calcium: 9.1 mg/dL (ref 8.9–10.3)
Chloride: 108 mmol/L (ref 98–111)
Creatinine: 1.02 mg/dL (ref 0.61–1.24)
GFR, Estimated: >60 mL/min (ref >60)
Glucose, Bld: 106 mg/dL — ABNORMAL HIGH (ref 70–99)
Potassium: 5.2 mmol/L — ABNORMAL HIGH (ref 3.5–5.1)
Sodium: 138 mmol/L (ref 135–145)
Total Bilirubin: 0.5 mg/dL (ref 0.0–1.2)
Total Protein: 7.2 g/dL (ref 6.5–8.1)

## 2023-09-03 LAB — CEA (ACCESS): CEA (CHCC): 464.31 ng/mL — ABNORMAL HIGH (ref 0.00–5.00)

## 2023-09-03 LAB — TOTAL PROTEIN, URINE DIPSTICK: Protein, ur: 30 mg/dL — AB

## 2023-09-03 MED ORDER — SODIUM CHLORIDE 0.9 % IV SOLN
8.0000 mg/kg | Freq: Once | INTRAVENOUS | Status: AC
  Administered 2023-09-03: 600 mg via INTRAVENOUS
  Filled 2023-09-03: qty 10

## 2023-09-03 MED ORDER — SODIUM CHLORIDE 0.9% FLUSH
10.0000 mL | INTRAVENOUS | Status: DC | PRN
  Administered 2023-09-03: 10 mL

## 2023-09-03 MED ORDER — SODIUM CHLORIDE 0.9 % IV SOLN
240.0000 mg | Freq: Once | INTRAVENOUS | Status: AC
  Administered 2023-09-03: 240 mg via INTRAVENOUS
  Filled 2023-09-03: qty 24

## 2023-09-03 MED ORDER — SODIUM CHLORIDE 0.9 % IV SOLN: INTRAVENOUS | Status: DC

## 2023-09-03 MED ORDER — ACETAMINOPHEN 325 MG PO TABS
650.0000 mg | ORAL_TABLET | Freq: Once | ORAL | Status: AC
  Administered 2023-09-03: 650 mg via ORAL
  Filled 2023-09-03: qty 2

## 2023-09-03 MED ORDER — ONDANSETRON HCL 4 MG/2ML IJ SOLN
8.0000 mg | Freq: Once | INTRAMUSCULAR | Status: AC
  Administered 2023-09-03: 8 mg via INTRAVENOUS
  Filled 2023-09-03: qty 4

## 2023-09-03 MED ORDER — IRON SUCROSE 20 MG/ML IV SOLN
200.0000 mg | Freq: Once | INTRAVENOUS | Status: AC
  Administered 2023-09-03: 200 mg via INTRAVENOUS
  Filled 2023-09-03: qty 10

## 2023-09-03 MED ORDER — SODIUM CHLORIDE 0.9 % IV SOLN
80.0000 mg/m2 | Freq: Once | INTRAVENOUS | Status: AC
  Administered 2023-09-03: 150 mg via INTRAVENOUS
  Filled 2023-09-03: qty 25

## 2023-09-03 MED ORDER — DIPHENHYDRAMINE HCL 50 MG/ML IJ SOLN
25.0000 mg | Freq: Once | INTRAMUSCULAR | Status: AC
  Administered 2023-09-03: 25 mg via INTRAVENOUS
  Filled 2023-09-03: qty 1

## 2023-09-03 MED ORDER — GABAPENTIN 600 MG PO TABS: 1200.0000 mg | ORAL_TABLET | Freq: Three times a day (TID) | ORAL | 1 refills | Status: DC

## 2023-09-03 MED ORDER — FAMOTIDINE IN NACL 20-0.9 MG/50ML-% IV SOLN
20.0000 mg | Freq: Once | INTRAVENOUS | Status: AC
  Administered 2023-09-03: 20 mg via INTRAVENOUS
  Filled 2023-09-03: qty 50

## 2023-09-03 NOTE — Patient Instructions (Signed)
 [facility] - A DEPT OF [facility]  Discharge Instructions: Thank you for choosing [facility] to provide your oncology and hematology care.   If you have a lab appointment with the Cancer Center, please go directly to the Cancer Center and check in at the registration area.   Wear comfortable clothing and clothing appropriate for easy access to any Portacath or PICC line.   We strive to give you quality time with your provider. You may need to reschedule your appointment if you arrive late (15 or more minutes).  Arriving late affects you and other patients whose appointments are after yours.  Also, if you miss three or more appointments without notifying the office, you may be dismissed from the clinic at the provider's discretion.      For prescription refill requests, have your pharmacy contact our office and allow 72 hours for refills to be completed.    Today you received the following chemotherapy and/or immunotherapy agents: Nivolumab  (Opdivo ), Ramucirumab  (Cyramza ), & Paclitaxel  (Taxol )    To help prevent nausea and vomiting after your treatment, we encourage you to take your nausea medication as directed.  BELOW ARE SYMPTOMS THAT SHOULD BE REPORTED IMMEDIATELY: *FEVER GREATER THAN 100.4 F (38 C) OR HIGHER *CHILLS OR SWEATING *NAUSEA AND VOMITING THAT IS NOT CONTROLLED WITH YOUR NAUSEA MEDICATION *UNUSUAL SHORTNESS OF BREATH *UNUSUAL BRUISING OR BLEEDING *URINARY PROBLEMS (pain or burning when urinating, or frequent urination) *BOWEL PROBLEMS (unusual diarrhea, constipation, pain near the anus) TENDERNESS IN MOUTH AND THROAT WITH OR WITHOUT PRESENCE OF ULCERS (sore throat, sores in mouth, or a toothache) UNUSUAL RASH, SWELLING OR PAIN  UNUSUAL VAGINAL DISCHARGE OR ITCHING   Items with * indicate a potential emergency and should be followed up as soon as possible or go to the Emergency Department if any problems should  occur.  Please show the CHEMOTHERAPY ALERT CARD or IMMUNOTHERAPY ALERT CARD at check-in to the Emergency Department and triage nurse.  Should you have questions after your visit or need to cancel or reschedule your appointment, please contact [facility] - A DEPT OF [facility]  Dept: [phone]  and follow the prompts.  Office hours are 8:00 a.m. to 4:30 p.m. Monday - Friday. Please note that voicemails left after 4:00 p.m. may not be returned until the following business day.  We are closed weekends and major holidays. You have access to a nurse at all times for urgent questions. Please call the main number to the clinic Dept: [phone] and follow the prompts.   For any non-urgent questions, you may also contact your provider using MyChart. We now offer e-Visits for anyone 68 and older to request care online for non-urgent symptoms. For details visit [email or web address].   Also download the MyChart app! Go to the app store, search MyChart, open the app, select , and log in with your MyChart username and password.

## 2023-09-03 NOTE — Progress Notes (Signed)
 [facility]   [phone] Fax:([phone]   Clinic Follow up Note   Patient Care Team: Pcp, No as PCP - [name], MD as Consulting Physician (Hematology and Oncology)  Date of Service:  09/03/2023  CHIEF COMPLAINT: f/u of gastric cancer  CURRENT THERAPY:  Second line chemotherapy paclitaxel  and ramucirumab  every 2 weeks  Oncology History   Gastric cancer (HCC)  cT3N3Mx, with hypermetabolic left supraclavicular node, and indeterminate adrenal nodule, MMR normal ypT3ypN3a, liver and nodes recurrence in 07/2022 -diagnosed 10/18/21 by EGD for 6 month f/u of gastric ulcers, showed mucosal intramucosal adenocarcinoma. MMR normal. -baseline CEA 11/01/21 significantly elevated at 1,989.58. -EUS on 11/06/21 by Dr. Burnette, staged as T3 N3 (at least stage III) -staging CT CAP 11/08/21 showed: stable gastric antrum wall thickening; increased size of upper abdominal lymph nodes; stable left adrenal nodule, 1.9 cm. -PET scan showed FDG avid lymph nodes within the gastrohepatic ligament, portacaval region, and mesentery. Tracer avid left supraclavicular lymph node and indeterminate left adrenal gland nodule with mild FDG uptake. -he underwent [place] node biopsy on 1/17 which was negative for malignant cells, but no lymphoid tissue seen on biopsy  -repeated PET on 02/07/22 after 3 months neoadjuvant chemo showed resolved left supraclavicular lymph node, and significant improvement in regional lymph nodes and the primary tumor, stable and indeterminate left adrenal nodule.  -CT adrenal protocol showed the left adrenal nodule is likely benign adenoma, no biopsy is needed. -Completed neoadjvant chemo FLOT s/p 12 cycles 11/22/21 - 04/27/2022 -restaging PET on 05/16/22 showed significantly hypermetabolic activity at the primary gastric cancer in pylorus, and a small hypermetabolic mesenteric lymph node, no other evidence of metastasis. surgeon Dr. Stevie aware of PET  findings -S/p distal gastrectomy 05/20/22, path showed ypT3N3a with clear margins, 7/18 + LNs, and lymphovascular invasion identified. There was some treatment effect in a few lymph nodes nut not in the primary tumor -due to rising tumor marker, he underwent PET scan on July 07, 2022, which unfortunately showed hypermetabolic new adenopathy in right hilar and mesentery, diffuse liver metastasis, consistent with metastatic recurrence.  I personally reviewed the PET scan images with patient -Unfortunately his disease is not curable at this stage. -The goal of chemotherapy is palliative, to prolong his life -I discussed his next generation sequencing foundation one result, which showed EGFR amplification, no other targetable mutations.  His tumor was previously tested for HER2 which was negative (0-1+).  PD-L1 CPS score was 2%, no significant benefit of immunotherapy.  -He started paclitaxel  and ramucirumab  on 07/25/2022  He developed worsening neuropathy quickly, and I had to switch his treatment to FOLFIRI on 08/29/2022, every 2 weeks, he is overall tolerating well. -restaging CT 01/19/2023 showed disease progression with new pathologic right paratracheal mediastinal lymph nodes as well as increasing size of several liver metastases. -I changed his chemo to taxol , ramucizumab and Nivo on 02/05/2023, every 2 weeks per his request  -restaging CT 04/08/2023 showed partial response in liver and mediastinal nodes  -restaging PET 07/24/2023 showed mixed response comparing to PET in 07/2022 and CT in 04/2023, with significantly improvement in liver mets and slightly progression in nodes and left adrenal glad metastasis -will continue paclitaxel , ramucirumab  and Nivo every 2 weeks   Assessment & Plan Gastric cancer under active treatment Gastric cancer with mixed response to treatment on last CT scan. CEA levels have been fluctuating, with a recent level of approximately 400. Last scan showed mixed response with some  areas improving and  others worsening. 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 Neuropathy affects fine motor skills and is exacerbated by prolonged standing, impacting work performance. He is considering short-term disability due to neuropathy and work-related challenges. 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 No significant improvement in symptoms following previous blood transfusion. Decision made to hold off on further transfusions unless hemoglobin drops below 8.0. - Monitor hemoglobin levels - Reassess need for blood transfusion in two weeks  Plan - Lab reviewed, adequate for treatment, will proceed chemo today and continue every 2 weeks -Refilled gabapentin  - Follow-up in 2 weeks - Will order restaging CT scan on next visit   SUMMARY OF ONCOLOGIC HISTORY: Oncology History Overview Note   Cancer Staging  Gastric cancer [facility]) Staging form: Stomach, AJCC 8th Edition - Clinical stage from 10/18/2021: Stage III (cT3, cN3a, cM0) - Signed by [name], MD on 11/10/2021     Gastric cancer (HCC)  10/18/2021 Procedure   EGD performed under the care of Dr. Dianna  Findings:  -A large, ulcerated, partially circumferential (involving two thirds of the lumen circumference) mass with no bleeding and stigmata of recent bleeding was found in the gastric antrum, at the pylorus and in the prepyloric region of the stomach. -Segmental severe inflammation characterized by congestion  (edema) and erythema was found in the gastric antrum.   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 Negative for dysplasia.  Negative for helicobacter pylori  Mismatch Repair (MMR) Protein Imunohistochemistry (IHC): IHC Expression Result: MLH1: Preserved nuclear expression. MSH2: Preserved nuclear expression. MSH6: Preserved nuclear expression. PMS2: Preserved nuclear expression. Interpretation: NORMAL   10/31/2021 Initial Diagnosis   Gastric cancer (HCC)   11/01/2021 Tumor Marker   CEA = 1,989.58 (^)   11/06/2021 Procedure   Upper EUS, Dr. Burnette  Impression: - Normal esophagus. - A small amount of food (residue) in the stomach. - Congested, friable (with contact bleeding) and ulcerated mucosa in the prepyloric region of the stomach. - Normal examined duodenum. - There was no evidence of significant pathology in the left lobe of the liver. - Many abnormal lymph nodes (over 10) were visualized in the gastrohepatic ligament (level 18), celiac region (level 20), perigastric region, peripancreatic region and aortocaval region. - Wall thickening was seen in the prepyloric region of the stomach. The thickening appeared to be primarily within the deep mucosa (Layer 2) but extended through the muscularis propria. - Overall constellation of findings consistent with at least T3 N3 Mx (at least stage III) gastric adenocarcinoma.   11/08/2021 Imaging   EXAM: CT CHEST, ABDOMEN, AND PELVIS WITH CONTRAST  IMPRESSION: 1. Similar irregular wall thickening of the gastric  antrum, consistent with patient's known primary gastric neoplasm. 2. Increased size of the upper abdominal lymph nodes,  concerning for worsening nodal disease involvement. 3. Stable left adrenal nodule common nonspecific possibly a metastatic lesion or adenoma. 4. No evidence of metastatic disease in the chest. 5. Left-sided colonic diverticulosis without findings of acute diverticulitis. 6. Enlarged prostate gland. 7.  Aortic Atherosclerosis (ICD10-I70.0).   11/22/2021 - 04/26/2022 Chemotherapy   Patient is on Treatment Plan : GASTROESOPHAGEAL FLOT q14d X 4 cycles     11/27/2021 Imaging    IMPRESSION: 1. The mass within the gastric antrum is mildly decreased in size when compared with 11/08/2021. There is persistent FDG uptake associated with this mass compatible with residual tumor. 2. Persistent FDG avid lymph nodes within the gastrohepatic ligament, portacaval region, and mesentery. These are mildly decreased in size when compared with 11/08/2021. 3. Tracer avid left supraclavicular lymph node is also mildly decreased in size when compared with 11/08/2021. 4. Indeterminate left adrenal gland nodule with mild FDG uptake. This may represent a lipid poor adenoma. Metastatic disease not exclude. 5. Diffuse increased uptake throughout the bone marrow is favored to represent treatment related change. 6.  Aortic Atherosclerosis (ICD10-I70.0).     02/07/2022 Imaging    IMPRESSION: 1. No substantial change in hypermetabolism associated with the distal gastric lesion. Although difficult to discern on noncontrast CT imaging, the soft tissue component appears to be decreased since the prior PET-CT. 2. Interval resolution of the hypermetabolic left supraclavicular lymph node. 3. Interval marked decrease of the hypermetabolic gastrohepatic ligament lymphadenopathy. Similar decrease in size and hypermetabolism associated with index nodes identified previously in the 4. porta hepatis  and hypogastric region. 5. Stable left adrenal nodule with slight hypermetabolism. Nonspecific finding could represent lipid poor adenoma or metastatic deposit. 6. No new suspicious hypermetabolic disease on today's study. 7.  Aortic Atherosclerosis (ICD10-I70.0).   07/25/2022 - 08/15/2022 Chemotherapy   Patient is on Treatment Plan : GASTROESOPHAGEAL Ramucirumab  D1, 15 + Paclitaxel  D1,8,15 q28d     08/29/2022 - 01/24/2023 Chemotherapy   Patient is on Treatment Plan : COLORECTAL FOLFIRI q14d     02/05/2023 -  Chemotherapy   Patient is on Treatment Plan : GASTROESOPHAGEAL Ramucirumab  D1, 15 + Paclitaxel  D1,8,15 q28d        Discussed the use of AI scribe software for clinical note transcription with the patient, who gave verbal consent to proceed.  History of Present Illness Jonathan Maynard is a 53 year old male with gastric esophageal cancer who presents for follow-up.  He is currently undergoing treatment for gastric esophageal cancer and tolerates it well without new issues. He takes dexamethasone  post-chemotherapy and has not used Protonix  for acid reflux. His hemoglobin is 8, slightly improved from 7.8, with no significant energy improvement post-transfusion.  He experiences chemotherapy-induced peripheral neuropathy, managed with gabapentin . He takes two gabapentin  in the morning and sometimes in the evening, occasionally missing the afternoon dose. Standing for long periods at work exacerbates his neuropathy.  His weight fluctuates between 165 and 171 pounds, attributed to eating habits and chemotherapy effects. He monitors his weight at home. He recently consumed two wine coolers and smoked a few cigarettes, concerned about his impact on CEA levels.  He plans to take six weeks of short-term disability starting September 10th due to health challenges. He is concerned about his ability to perform his job, which involves physical demands that worsen his neuropathy.      All other systems were reviewed with the patient and are negative.  MEDICAL HISTORY:  Past Medical History:  Diagnosis Date   Acute upper  GI bleed 04/12/2021   Anemia    Cancer (HCC)    stomach   Class 1 obesity 04/12/2021   Diverticulosis 04/12/2021   GERD (gastroesophageal reflux disease)    Hepatic steatosis 04/12/2021   Neuromuscular disorder (HCC)    neuropathy from chemo    SURGICAL HISTORY: Past Surgical History:  Procedure Laterality Date   BIOPSY  04/12/2021   Procedure: BIOPSY;  Surgeon: Dianna Specking, MD;  Location: [facility];  Service: Gastroenterology;;   ESOPHAGOGASTRODUODENOSCOPY N/A 04/12/2021   Procedure: ESOPHAGOGASTRODUODENOSCOPY (EGD);  Surgeon: Dianna Specking, MD;  Location: [facility];  Service: Gastroenterology;  Laterality: N/A;   ESOPHAGOGASTRODUODENOSCOPY N/A 11/06/2021   Procedure: ESOPHAGOGASTRODUODENOSCOPY (EGD);  Surgeon: Burnette Fallow, MD;  Location: [facility];  Service: Gastroenterology;  Laterality: N/A;   GASTRECTOMY N/A 05/20/2022   Procedure: OPEN PARTIAL GASTRECTOMY WITH GASTROJEJUNAL ANASTOMOSIS;  Surgeon: Stevie, Herlene Righter, MD;  Location: [facility];  Service: General;  Laterality: N/A;   HERNIA REPAIR Left    inguinal   IR IMAGING GUIDED PORT INSERTION  11/15/2021   LAPAROSCOPY N/A 05/20/2022   Procedure: LAPAROSCOPY DIAGNOSTIC;  Surgeon: Stevie Herlene Righter, MD;  Location: [facility];  Service: General;  Laterality: N/A;   UPPER ESOPHAGEAL ENDOSCOPIC ULTRASOUND (EUS) Bilateral 11/06/2021   Procedure: UPPER ESOPHAGEAL ENDOSCOPIC ULTRASOUND (EUS);  Surgeon: Burnette Fallow, MD;  Location: [facility];  Service: Gastroenterology;  Laterality: Bilateral;    I have reviewed the social history and family history with the patient and they are unchanged from previous note.  ALLERGIES:  has no known allergies.  MEDICATIONS:  Current Outpatient Medications  Medication Sig Dispense Refill   acetaminophen  (TYLENOL ) 500 MG tablet  Take 1 tablet (500 mg total) by mouth 2 (two) times daily. 60 tablet 3   dexamethasone  (DECADRON ) 4 MG tablet Take 1 tablet (4 mg total) by mouth daily. Start the day after chemotherapy for 1-3 days.  Take with food. 30 tablet 1   diclofenac  Sodium (VOLTAREN  ARTHRITIS PAIN) 1 % GEL Apply 4 g topically 4 (four) times daily. 100 g 3   gabapentin  (NEURONTIN ) 600 MG tablet Take 2 tablets (1,200 mg total) by mouth 3 (three) times daily. 180 tablet 1   magic mouthwash (nystatin , lidocaine , diphenhydrAMINE , alum & mag hydroxide) suspension Swish for 5 seconds and swallow 5 mLs by mouth 3 (three) times daily as needed for mouth pain. 140 mL 0   ondansetron  (ZOFRAN -ODT) 4 MG disintegrating tablet Dissolve 1 tablet (4 mg total) by mouth every 6 (six) hours as needed for nausea or vomiting. 30 tablet 1   pantoprazole  (PROTONIX ) 40 MG tablet Take 1 tablet (40 mg) by mouth 2 times daily. 60 tablet 6   prochlorperazine  (COMPAZINE ) 10 MG tablet Take 1 tablet (10 mg total) by mouth every 6 (six) hours as needed for nausea or vomiting. 30 tablet 2   No current facility-administered medications for this visit.   Facility-Administered Medications Ordered in Other Visits  Medication Dose Route Frequency Provider Last Rate Last Admin   0.9 %  sodium chloride  infusion   Intravenous Continuous [name], MD 10 mL/hr at 09/03/23 0948 New Bag at 09/03/23 0948   PACLitaxel  (TAXOL ) 150 mg in sodium chloride  0.9 % 250 mL chemo infusion (</= 80mg /m2)  80 mg/m2 (Treatment Plan Recorded) Intravenous Once [name], MD       sodium chloride  flush (NS) 0.9 % injection 10 mL  10 mL Intracatheter PRN [name], MD        PHYSICAL EXAMINATION: ECOG PERFORMANCE STATUS:  1 - Symptomatic but completely ambulatory  Vitals:   09/03/23 0856  BP: 114/62  Pulse: 89  Resp: 16  Temp: 99.4 F (37.4 C)  SpO2: 100%   Wt Readings from Last 3 Encounters:  09/03/23 163 lb 14.4 oz (74.3 kg)  08/20/23 169 lb 1.6 oz (76.7 kg)  08/06/23  169 lb 1.6 oz (76.7 kg)     GENERAL:alert, no distress and comfortable SKIN: skin color, texture, turgor are normal, no rashes or significant lesions EYES: normal, Conjunctiva are pink and non-injected, sclera clear NECK: supple, thyroid  normal size, non-tender, without nodularity LYMPH:  no palpable lymphadenopathy in the cervical, axillary  LUNGS: clear to auscultation and percussion with normal breathing effort HEART: regular rate & rhythm and no murmurs and no lower extremity edema ABDOMEN:abdomen soft, non-tender and normal bowel sounds Musculoskeletal:no cyanosis of digits and no clubbing    Physical Exam    LABORATORY DATA:  I have reviewed the data as listed    Latest Ref Rng & Units 09/03/2023    8:30 AM 08/20/2023    8:08 AM 08/06/2023    8:16 AM  CBC  WBC 4.0 - 10.5 K/uL 5.0  5.2  5.1   Hemoglobin 13.0 - 17.0 g/dL 8.0  7.8  8.3   Hematocrit 39.0 - 52.0 % 26.1  24.8  27.6   Platelets 150 - 400 K/uL 155  153  163         Latest Ref Rng & Units 09/03/2023    8:30 AM 08/20/2023    8:08 AM 08/06/2023    8:16 AM  CMP  Glucose 70 - 99 mg/dL 893  875  889   BUN 6 - 20 mg/dL 15  8  10    Creatinine 0.61 - 1.24 mg/dL 8.97  9.26  9.21   Sodium 135 - 145 mmol/L 138  138  139   Potassium 3.5 - 5.1 mmol/L 5.2  3.7  4.5   Chloride 98 - 111 mmol/L 108  107  106   CO2 22 - 32 mmol/L 27  25  42   Calcium  8.9 - 10.3 mg/dL 9.1  8.6  9.3   Total Protein 6.5 - 8.1 g/dL 7.2  6.9  6.9   Total Bilirubin 0.0 - 1.2 mg/dL 0.5  0.5  0.4   Alkaline Phos 38 - 126 U/L 85  94  87   AST 15 - 41 U/L 27  25  23    ALT 0 - 44 U/L 13  13  13        RADIOGRAPHIC STUDIES: I have personally reviewed the radiological images as listed and agreed with the findings in the report. No results found.    Orders Placed This Encounter  Procedures   CBC with Differential (Cancer Center Only)    Standing Status:   Future    Expected Date:   11/12/2023    Expiration Date:   11/11/2024   CMP (Cancer  Center only)    Standing Status:   Future    Expected Date:   11/12/2023    Expiration Date:   11/11/2024   T4    Standing Status:   Future    Expected Date:   11/12/2023    Expiration Date:   11/11/2024   TSH    Standing Status:   Future    Expected Date:   11/12/2023    Expiration Date:   11/11/2024   Total Protein, Urine dipstick    Standing Status:   Future  Expected Date:   11/12/2023    Expiration Date:   11/11/2024   CBC with Differential (Cancer Center Only)    Standing Status:   Future    Expected Date:   11/26/2023    Expiration Date:   11/25/2024   CMP (Cancer Center only)    Standing Status:   Future    Expected Date:   11/26/2023    Expiration Date:   11/25/2024   All questions were answered. The patient knows to call the clinic with any problems, questions or concerns. No barriers to learning was detected. The total time spent in the appointment was 25 minutes, including review of chart and various tests results, discussions about plan of care and coordination of care plan     Onita Mattock, MD 09/03/2023

## 2023-09-04 ENCOUNTER — Other Ambulatory Visit: Payer: Self-pay

## 2023-09-06 ENCOUNTER — Other Ambulatory Visit: Payer: Self-pay

## 2023-09-09 ENCOUNTER — Ambulatory Visit: Admitting: Family Medicine

## 2023-09-09 ENCOUNTER — Telehealth: Payer: Self-pay

## 2023-09-09 NOTE — Telephone Encounter (Signed)
 Pt called stating that Dr. Lanny forgot to refill the pt's Gabapentin  last time in clinic.  Reviewed pt's chart and stated the refill was sent to [facility] (provided the location) on 09/03/2023 with 1 refill.  Pt stated he would contact [facility].  Pt had no further question or concerns.

## 2023-09-10 ENCOUNTER — Encounter: Payer: Self-pay | Admitting: Hematology

## 2023-09-10 ENCOUNTER — Ambulatory Visit: Admitting: Family Medicine

## 2023-09-11 ENCOUNTER — Telehealth: Payer: Self-pay

## 2023-09-11 NOTE — Telephone Encounter (Signed)
 Notified the pt regarding his FMLA/Disability forms being completed, faxed, and confirmation Received. Pt stated that he will pick up his copies next visit 09/17/2023.No questions or concerns to be noted at this time.

## 2023-09-16 NOTE — Assessment & Plan Note (Signed)
  cT3N3Mx, with hypermetabolic left supraclavicular node, and indeterminate adrenal nodule, MMR normal ypT3ypN3a, liver and nodes recurrence in 07/2022 -diagnosed 10/18/21 by EGD for 6 month f/u of gastric ulcers, showed mucosal intramucosal adenocarcinoma. MMR normal. -baseline CEA 11/01/21 significantly elevated at 1,989.58. -EUS on 11/06/21 by Dr. Burnette, staged as T3 N3 (at least stage III) -staging CT CAP 11/08/21 showed: stable gastric antrum wall thickening; increased size of upper abdominal lymph nodes; stable left adrenal nodule, 1.9 cm. -PET scan showed FDG avid lymph nodes within the gastrohepatic ligament, portacaval region, and mesentery. Tracer avid left supraclavicular lymph node and indeterminate left adrenal gland nodule with mild FDG uptake. -he underwent [place] node biopsy on 1/17 which was negative for malignant cells, but no lymphoid tissue seen on biopsy  -repeated PET on 02/07/22 after 3 months neoadjuvant chemo showed resolved left supraclavicular lymph node, and significant improvement in regional lymph nodes and the primary tumor, stable and indeterminate left adrenal nodule.  -CT adrenal protocol showed the left adrenal nodule is likely benign adenoma, no biopsy is needed. -Completed neoadjvant chemo FLOT s/p 12 cycles 11/22/21 - 04/27/2022 -restaging PET on 05/16/22 showed significantly hypermetabolic activity at the primary gastric cancer in pylorus, and a small hypermetabolic mesenteric lymph node, no other evidence of metastasis. surgeon Dr. Stevie aware of PET findings -S/p distal gastrectomy 05/20/22, path showed ypT3N3a with clear margins, 7/18 + LNs, and lymphovascular invasion identified. There was some treatment effect in a few lymph nodes nut not in the primary tumor -due to rising tumor marker, he underwent PET scan on July 07, 2022, which unfortunately showed hypermetabolic new adenopathy in right hilar and mesentery, diffuse liver metastasis, consistent with metastatic  recurrence.  I personally reviewed the PET scan images with patient -Unfortunately his disease is not curable at this stage. -The goal of chemotherapy is palliative, to prolong his life -I discussed his next generation sequencing foundation one result, which showed EGFR amplification, no other targetable mutations.  His tumor was previously tested for HER2 which was negative (0-1+).  PD-L1 CPS score was 2%, no significant benefit of immunotherapy.  -He started paclitaxel  and ramucirumab  on 07/25/2022  He developed worsening neuropathy quickly, and I had to switch his treatment to FOLFIRI on 08/29/2022, every 2 weeks, he is overall tolerating well. -restaging CT 01/19/2023 showed disease progression with new pathologic right paratracheal mediastinal lymph nodes as well as increasing size of several liver metastases. -I changed his chemo to taxol , ramucizumab and Nivo on 02/05/2023, every 2 weeks per his request  -restaging CT 04/08/2023 showed partial response in liver and mediastinal nodes  -restaging PET 07/24/2023 showed mixed response comparing to PET in 07/2022 and CT in 04/2023, with significantly improvement in liver mets and slightly progression in nodes and left adrenal glad metastasis -will continue paclitaxel , ramucirumab  and Nivo every 2 weeks

## 2023-09-17 ENCOUNTER — Inpatient Hospital Stay

## 2023-09-17 ENCOUNTER — Inpatient Hospital Stay: Attending: Physician Assistant | Admitting: Hematology

## 2023-09-17 VITALS — BP 124/68 | HR 87 | Temp 98.5°F | Resp 16 | Ht 65.0 in | Wt 166.8 lb

## 2023-09-17 DIAGNOSIS — Z5112 Encounter for antineoplastic immunotherapy: Secondary | ICD-10-CM | POA: Insufficient documentation

## 2023-09-17 DIAGNOSIS — C163 Malignant neoplasm of pyloric antrum: Secondary | ICD-10-CM | POA: Diagnosis not present

## 2023-09-17 DIAGNOSIS — E278 Other specified disorders of adrenal gland: Secondary | ICD-10-CM | POA: Insufficient documentation

## 2023-09-17 DIAGNOSIS — T451X5D Adverse effect of antineoplastic and immunosuppressive drugs, subsequent encounter: Secondary | ICD-10-CM | POA: Diagnosis not present

## 2023-09-17 DIAGNOSIS — Z7952 Long term (current) use of systemic steroids: Secondary | ICD-10-CM | POA: Insufficient documentation

## 2023-09-17 DIAGNOSIS — G620 Drug-induced polyneuropathy: Secondary | ICD-10-CM | POA: Diagnosis not present

## 2023-09-17 DIAGNOSIS — D6481 Anemia due to antineoplastic chemotherapy: Secondary | ICD-10-CM | POA: Insufficient documentation

## 2023-09-17 DIAGNOSIS — Z79899 Other long term (current) drug therapy: Secondary | ICD-10-CM | POA: Insufficient documentation

## 2023-09-17 DIAGNOSIS — D500 Iron deficiency anemia secondary to blood loss (chronic): Secondary | ICD-10-CM

## 2023-09-17 DIAGNOSIS — Z5111 Encounter for antineoplastic chemotherapy: Secondary | ICD-10-CM | POA: Insufficient documentation

## 2023-09-17 DIAGNOSIS — Z79620 Long term (current) use of immunosuppressive biologic: Secondary | ICD-10-CM | POA: Diagnosis not present

## 2023-09-17 DIAGNOSIS — R809 Proteinuria, unspecified: Secondary | ICD-10-CM | POA: Insufficient documentation

## 2023-09-17 LAB — CBC WITH DIFFERENTIAL (CANCER CENTER ONLY)
Abs Immature Granulocytes: 0.02 K/uL (ref 0.00–0.07)
Basophils Absolute: 0 K/uL (ref 0.0–0.1)
Basophils Relative: 1 %
Eosinophils Absolute: 0.1 K/uL (ref 0.0–0.5)
Eosinophils Relative: 2 %
HCT: 24.4 % — ABNORMAL LOW (ref 39.0–52.0)
Hemoglobin: 7.7 g/dL — ABNORMAL LOW (ref 13.0–17.0)
Immature Granulocytes: 1 %
Lymphocytes Relative: 29 %
Lymphs Abs: 1.1 K/uL (ref 0.7–4.0)
MCH: 26.7 pg (ref 26.0–34.0)
MCHC: 31.6 g/dL (ref 30.0–36.0)
MCV: 84.7 fL (ref 80.0–100.0)
Monocytes Absolute: 0.5 K/uL (ref 0.1–1.0)
Monocytes Relative: 14 %
Neutro Abs: 2.1 K/uL (ref 1.7–7.7)
Neutrophils Relative %: 53 %
Platelet Count: 144 K/uL — ABNORMAL LOW (ref 150–400)
RBC: 2.88 MIL/uL — ABNORMAL LOW (ref 4.22–5.81)
RDW: 23.9 % — ABNORMAL HIGH (ref 11.5–15.5)
WBC Count: 3.9 K/uL — ABNORMAL LOW (ref 4.0–10.5)
nRBC: 0 % (ref 0.0–0.2)

## 2023-09-17 LAB — CMP (CANCER CENTER ONLY)
ALT: 11 U/L (ref 0–44)
AST: 22 U/L (ref 15–41)
Albumin: 4 g/dL (ref 3.5–5.0)
Alkaline Phosphatase: 79 U/L (ref 38–126)
Anion gap: 5 (ref 5–15)
BUN: 14 mg/dL (ref 6–20)
CO2: 26 mmol/L (ref 22–32)
Calcium: 8.9 mg/dL (ref 8.9–10.3)
Chloride: 110 mmol/L (ref 98–111)
Creatinine: 0.84 mg/dL (ref 0.61–1.24)
GFR, Estimated: >60 mL/min (ref >60)
Glucose, Bld: 111 mg/dL — ABNORMAL HIGH (ref 70–99)
Potassium: 4.5 mmol/L (ref 3.5–5.1)
Sodium: 141 mmol/L (ref 135–145)
Total Bilirubin: 0.4 mg/dL (ref 0.0–1.2)
Total Protein: 6.9 g/dL (ref 6.5–8.1)

## 2023-09-17 LAB — TOTAL PROTEIN, URINE DIPSTICK: Protein, ur: NEGATIVE mg/dL

## 2023-09-17 LAB — CEA (ACCESS): CEA (CHCC): 459.95 ng/mL — ABNORMAL HIGH (ref 0.00–5.00)

## 2023-09-17 LAB — TSH: TSH: 1.65 u[IU]/mL (ref 0.350–4.500)

## 2023-09-17 LAB — FERRITIN: Ferritin: 109 ng/mL (ref 24–336)

## 2023-09-17 MED ORDER — SODIUM CHLORIDE 0.9 % IV SOLN
8.0000 mg/kg | Freq: Once | INTRAVENOUS | Status: AC
  Administered 2023-09-17: 600 mg via INTRAVENOUS
  Filled 2023-09-17: qty 10

## 2023-09-17 MED ORDER — DIPHENHYDRAMINE HCL 50 MG/ML IJ SOLN
25.0000 mg | Freq: Once | INTRAMUSCULAR | Status: AC
  Administered 2023-09-17: 25 mg via INTRAVENOUS
  Filled 2023-09-17: qty 1

## 2023-09-17 MED ORDER — SODIUM CHLORIDE 0.9 % IV SOLN: INTRAVENOUS | Status: DC

## 2023-09-17 MED ORDER — ONDANSETRON HCL 4 MG/2ML IJ SOLN
8.0000 mg | Freq: Once | INTRAMUSCULAR | Status: AC
  Administered 2023-09-17: 8 mg via INTRAVENOUS
  Filled 2023-09-17: qty 4

## 2023-09-17 MED ORDER — ACETAMINOPHEN 325 MG PO TABS
650.0000 mg | ORAL_TABLET | Freq: Once | ORAL | Status: AC
  Administered 2023-09-17: 650 mg via ORAL
  Filled 2023-09-17: qty 2

## 2023-09-17 MED ORDER — SODIUM CHLORIDE 0.9 % IV SOLN
80.0000 mg/m2 | Freq: Once | INTRAVENOUS | Status: AC
  Administered 2023-09-17: 150 mg via INTRAVENOUS
  Filled 2023-09-17: qty 25

## 2023-09-17 MED ORDER — SODIUM CHLORIDE 0.9% FLUSH: 10.0000 mL | INTRAVENOUS | Status: DC | PRN

## 2023-09-17 MED ORDER — SODIUM CHLORIDE 0.9 % IV SOLN
240.0000 mg | Freq: Once | INTRAVENOUS | Status: AC
  Administered 2023-09-17: 240 mg via INTRAVENOUS
  Filled 2023-09-17: qty 24

## 2023-09-17 MED ORDER — FAMOTIDINE IN NACL 20-0.9 MG/50ML-% IV SOLN
20.0000 mg | Freq: Once | INTRAVENOUS | Status: AC
  Administered 2023-09-17: 20 mg via INTRAVENOUS
  Filled 2023-09-17: qty 50

## 2023-09-17 NOTE — Progress Notes (Signed)
 [name]   Telephone:([phone]) [phone] Fax:([phone]   Clinic Follow up Note   Patient Care Team: Pcp, No as PCP - General [name], MD as Consulting Physician (Hematology and Oncology)  Date of Service:  09/17/2023  CHIEF COMPLAINT: f/u of gastric cancer  CURRENT THERAPY:  Second line chemotherapy paclitaxel  and ramucirumab  every 2 weeks  Oncology History   Gastric cancer (HCC)  cT3N3Mx, with hypermetabolic left supraclavicular node, and indeterminate adrenal nodule, MMR normal ypT3ypN3a, liver and nodes recurrence in 07/2022 -diagnosed 10/18/21 by EGD for 6 month f/u of gastric ulcers, showed mucosal intramucosal adenocarcinoma. MMR normal. -baseline CEA 11/01/21 significantly elevated at 1,989.58. -EUS on 11/06/21 by Dr. Burnette, staged as T3 N3 (at least stage III) -staging CT CAP 11/08/21 showed: stable gastric antrum wall thickening; increased size of upper abdominal lymph nodes; stable left adrenal nodule, 1.9 cm. -PET scan showed FDG avid lymph nodes within the gastrohepatic ligament, portacaval region, and mesentery. Tracer avid left supraclavicular lymph node and indeterminate left adrenal gland nodule with mild FDG uptake. -he underwent  node biopsy on 1/17 which was negative for malignant cells, but no lymphoid tissue seen on biopsy  -repeated PET on 02/07/22 after 3 months neoadjuvant chemo showed resolved left supraclavicular lymph node, and significant improvement in regional lymph nodes and the primary tumor, stable and indeterminate left adrenal nodule.  -CT adrenal protocol showed the left adrenal nodule is likely benign adenoma, no biopsy is needed. -Completed neoadjvant chemo FLOT s/p 12 cycles 11/22/21 - 04/27/2022 -restaging PET on 05/16/22 showed significantly hypermetabolic activity at the primary gastric cancer in pylorus, and a small hypermetabolic mesenteric lymph node, no other evidence of metastasis. surgeon Dr. Stevie aware of PET  findings -S/p distal gastrectomy 05/20/22, path showed ypT3N3a with clear margins, 7/18 + LNs, and lymphovascular invasion identified. There was some treatment effect in a few lymph nodes nut not in the primary tumor -due to rising tumor marker, he underwent PET scan on July 07, 2022, which unfortunately showed hypermetabolic new adenopathy in right hilar and mesentery, diffuse liver metastasis, consistent with metastatic recurrence.  I personally reviewed the PET scan images with patient -Unfortunately his disease is not curable at this stage. -The goal of chemotherapy is palliative, to prolong his life -I discussed his next generation sequencing foundation one result, which showed EGFR amplification, no other targetable mutations.  His tumor was previously tested for HER2 which was negative (0-1+).  PD-L1 CPS score was 2%, no significant benefit of immunotherapy.  -He started paclitaxel  and ramucirumab  on 07/25/2022  He developed worsening neuropathy quickly, and I had to switch his treatment to FOLFIRI on 08/29/2022, every 2 weeks, he is overall tolerating well. -restaging CT 01/19/2023 showed disease progression with new pathologic right paratracheal mediastinal lymph nodes as well as increasing size of several liver metastases. 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 No new symptoms reported. He is on short-term disability and is advised to  remain active to maintain muscle strength and  endurance. 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 Symptoms are manageable with current medication regimen. 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 No recent blood transfusion. Advised to monitor symptoms and consider transfusion if hemoglobin drops below 7.5 or if symptoms worsen. Encouraged to take multivitamin with iron , such as prenatal vitamins, to support anemia management. - Monitor hemoglobin levels - Consider blood transfusion if hemoglobin drops below 7.5 or if symptoms worsen - Recommend multivitamin with iron , such as prenatal vitamins, to support anemia management  Proteinuria during chemotherapy Proteinuria present but not at levels requiring intervention. Last urine protein level was 30, which is below the threshold for holding ramucirumab  treatment. - Monitor urine protein levels - Continue ramucirumab  unless urine protein exceeds 300  Plan - Patient is clinically stable, lab reviewed, adequate for treatment, will proceed treatment today and continue every 2 weeks -Hemoglobin 7.7 today, he is not symptomatic from anemia, will hold on blood transfusion today - Follow-up in 2 weeks before next treatment   SUMMARY OF ONCOLOGIC HISTORY: Oncology History Overview Note   Cancer Staging  Gastric cancer [facility]) Staging form: Stomach, AJCC 8th Edition - Clinical stage from 10/18/2021: Stage III (cT3, cN3a, cM0) - Signed by [name], MD on 11/10/2021     Gastric cancer (HCC)  10/18/2021 Procedure   EGD performed under the care of Dr. Dianna  Findings:  -A large, ulcerated, partially circumferential (involving two thirds of the lumen circumference) mass with no bleeding and stigmata of recent bleeding was found in the  gastric antrum, at the pylorus and in the prepyloric region of the stomach. -Segmental severe inflammation characterized by congestion (edema) and erythema was found in the gastric antrum.   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 Negative for dysplasia.  Negative for helicobacter pylori  Mismatch Repair (MMR) Protein Imunohistochemistry (IHC): IHC Expression Result: MLH1: Preserved nuclear expression. MSH2: Preserved nuclear expression. MSH6: Preserved nuclear expression. PMS2: Preserved nuclear expression. Interpretation: NORMAL   10/31/2021 Initial Diagnosis   Gastric cancer (HCC)   11/01/2021 Tumor Marker   CEA = 1,989.58 (^)   11/06/2021 Procedure   Upper EUS, Dr. Burnette  Impression: - Normal esophagus. - A small amount of food (residue) in the stomach. - Congested, friable (with contact bleeding) and ulcerated mucosa in the prepyloric region of the stomach. - Normal examined duodenum. - There was no evidence of significant pathology in the left lobe of the liver. - Many abnormal lymph nodes (over 10) were visualized in the gastrohepatic ligament (level 18), celiac region (level 20), perigastric region, peripancreatic region and aortocaval region. - Wall thickening was seen in the prepyloric region of the stomach. The thickening appeared to be primarily within the deep mucosa (Layer 2) but extended through the muscularis propria. - Overall constellation of findings consistent with at least T3 N3 Mx (at least stage III) gastric adenocarcinoma.    11/08/2021 Imaging   EXAM: CT CHEST, ABDOMEN, AND PELVIS WITH CONTRAST  IMPRESSION: 1. Similar irregular wall thickening of the gastric antrum, consistent with  patient's known primary gastric neoplasm. 2. Increased size of the upper abdominal lymph nodes, concerning for worsening nodal disease involvement. 3. Stable left adrenal nodule common nonspecific possibly a metastatic lesion or adenoma. 4. No evidence of metastatic disease in the chest. 5. Left-sided colonic diverticulosis without findings of acute diverticulitis. 6. Enlarged prostate gland. 7.  Aortic Atherosclerosis (ICD10-I70.0).   11/22/2021 - 04/26/2022 Chemotherapy   Patient is on Treatment Plan : GASTROESOPHAGEAL FLOT q14d X 4 cycles     11/27/2021 Imaging    IMPRESSION: 1. The mass within the gastric antrum is mildly decreased in size when compared with 11/08/2021. There is persistent FDG uptake associated with this mass compatible with residual tumor. 2. Persistent FDG avid lymph nodes within the gastrohepatic ligament, portacaval region, and mesentery. These are mildly decreased in size when compared with 11/08/2021. 3. Tracer avid left supraclavicular lymph node is also mildly decreased in size when compared with 11/08/2021. 4. Indeterminate left adrenal gland nodule with mild FDG uptake. This may represent a lipid poor adenoma. Metastatic disease not exclude. 5. Diffuse increased uptake throughout the bone marrow is favored to represent treatment related change. 6.  Aortic Atherosclerosis (ICD10-I70.0).     02/07/2022 Imaging    IMPRESSION: 1. No substantial change in hypermetabolism associated with the distal gastric lesion. Although difficult to discern on noncontrast CT imaging, the soft tissue component appears to be decreased since the prior PET-CT. 2. Interval resolution of the hypermetabolic left supraclavicular lymph node. 3. Interval marked decrease of the hypermetabolic  gastrohepatic ligament lymphadenopathy. Similar decrease in size and hypermetabolism associated with index nodes identified previously in the 4. porta hepatis and hypogastric region. 5. Stable left adrenal nodule with slight hypermetabolism. Nonspecific finding could represent lipid poor adenoma or metastatic deposit. 6. No new suspicious hypermetabolic disease on today's study. 7.  Aortic Atherosclerosis (ICD10-I70.0).   07/25/2022 - 08/15/2022 Chemotherapy   Patient is on Treatment Plan : GASTROESOPHAGEAL Ramucirumab  D1, 15 + Paclitaxel  D1,8,15 q28d     08/29/2022 - 01/24/2023 Chemotherapy   Patient is on Treatment Plan : COLORECTAL FOLFIRI q14d     02/05/2023 -  Chemotherapy   Patient is on Treatment Plan : GASTROESOPHAGEAL Ramucirumab  D1, 15 + Paclitaxel  D1,8,15 q28d        Discussed the use of AI scribe software for clinical note transcription with the patient, who gave verbal consent to proceed.  History of Present Illness Jonathan Maynard is a 53 year old male with metastatic esophageal and gastric cancer who presents for follow-up.  His neuropathy symptoms are stable and managed with gabapentin . He uses dexamethasone  occasionally, about once a week, following chemotherapy sessions.  His hemoglobin levels have been fluctuating, with a recent measurement of 7.7, down from 8 two weeks ago. He has not had a blood transfusion recently and is monitoring for signs of increased fatigue.  He maintains a diet that includes vegetables and is considering taking a multivitamin to support his nutritional needs, particularly to address anemia.     All other systems were reviewed with the patient and are negative.  MEDICAL HISTORY:  Past Medical History:  Diagnosis Date   Acute upper GI bleed 04/12/2021   Anemia    Cancer (HCC)    stomach   Class 1 obesity 04/12/2021   Diverticulosis 04/12/2021   GERD (gastroesophageal reflux disease)    Hepatic steatosis 04/12/2021    Neuromuscular disorder (HCC)    neuropathy from chemo    SURGICAL HISTORY: Past Surgical History:  Procedure  Laterality Date   BIOPSY  04/12/2021   Procedure: BIOPSY;  Surgeon: Dianna Specking, MD;  Location: [facility];  Service: Gastroenterology;;   ESOPHAGOGASTRODUODENOSCOPY N/A 04/12/2021   Procedure: ESOPHAGOGASTRODUODENOSCOPY (EGD);  Surgeon: Dianna Specking, MD;  Location: [facility];  Service: Gastroenterology;  Laterality: N/A;   ESOPHAGOGASTRODUODENOSCOPY N/A 11/06/2021   Procedure: ESOPHAGOGASTRODUODENOSCOPY (EGD);  Surgeon: Burnette Fallow, MD;  Location: [facility];  Service: Gastroenterology;  Laterality: N/A;   GASTRECTOMY N/A 05/20/2022   Procedure: OPEN PARTIAL GASTRECTOMY WITH GASTROJEJUNAL ANASTOMOSIS;  Surgeon: Stevie, Herlene Righter, MD;  Location: [facility];  Service: General;  Laterality: N/A;   HERNIA REPAIR Left    inguinal   IR IMAGING GUIDED PORT INSERTION  11/15/2021   LAPAROSCOPY N/A 05/20/2022   Procedure: LAPAROSCOPY DIAGNOSTIC;  Surgeon: Stevie Herlene Righter, MD;  Location: [facility];  Service: General;  Laterality: N/A;   UPPER ESOPHAGEAL ENDOSCOPIC ULTRASOUND (EUS) Bilateral 11/06/2021   Procedure: UPPER ESOPHAGEAL ENDOSCOPIC ULTRASOUND (EUS);  Surgeon: Burnette Fallow, MD;  Location: [facility];  Service: Gastroenterology;  Laterality: Bilateral;    I have reviewed the social history and family history with the patient and they are unchanged from previous note.  ALLERGIES:  has no known allergies.  MEDICATIONS:  Current Outpatient Medications  Medication Sig Dispense Refill   acetaminophen  (TYLENOL ) 500 MG tablet Take 1 tablet (500 mg total) by mouth 2 (two) times daily. 60 tablet 3   dexamethasone  (DECADRON ) 4 MG tablet Take 1 tablet (4 mg total) by mouth daily. Start the day after chemotherapy for 1-3 days.  Take with food. 30 tablet 1   diclofenac  Sodium (VOLTAREN  ARTHRITIS PAIN) 1 % GEL Apply 4 g topically 4 (four) times daily. 100 g 3    gabapentin  (NEURONTIN ) 600 MG tablet Take 2 tablets (1,200 mg total) by mouth 3 (three) times daily. 180 tablet 1   magic mouthwash (nystatin , lidocaine , diphenhydrAMINE , alum & mag hydroxide) suspension Swish for 5 seconds and swallow 5 mLs by mouth 3 (three) times daily as needed for mouth pain. 140 mL 0   ondansetron  (ZOFRAN -ODT) 4 MG disintegrating tablet Dissolve 1 tablet (4 mg total) by mouth every 6 (six) hours as needed for nausea or vomiting. 30 tablet 1   pantoprazole  (PROTONIX ) 40 MG tablet Take 1 tablet (40 mg) by mouth 2 times daily. 60 tablet 6   prochlorperazine  (COMPAZINE ) 10 MG tablet Take 1 tablet (10 mg total) by mouth every 6 (six) hours as needed for nausea or vomiting. 30 tablet 2   No current facility-administered medications for this visit.   Facility-Administered Medications Ordered in Other Visits  Medication Dose Route Frequency Provider Last Rate Last Admin   0.9 %  sodium chloride  infusion   Intravenous Continuous [name], MD   Stopped at 09/17/23 1213   sodium chloride  flush (NS) 0.9 % injection 10 mL  10 mL Intracatheter PRN [name], MD        PHYSICAL EXAMINATION: ECOG PERFORMANCE STATUS: 1 - Symptomatic but completely ambulatory  Vitals:   09/17/23 0820  BP: 124/68  Pulse: 87  Resp: 16  Temp: 98.5 F (36.9 C)  SpO2: 99%   Wt Readings from Last 3 Encounters:  09/17/23 166 lb 12.8 oz (75.7 kg)  09/03/23 163 lb 14.4 oz (74.3 kg)  08/20/23 169 lb 1.6 oz (76.7 kg)     GENERAL:alert, no distress and comfortable SKIN: skin color, texture, turgor are normal, no rashes or significant lesions EYES: normal, Conjunctiva are pink and non-injected, sclera clear NECK: supple,  thyroid  normal size, non-tender, without nodularity LYMPH:  no palpable lymphadenopathy in the cervical, axillary  LUNGS: clear to auscultation and percussion with normal breathing effort HEART: regular rate & rhythm and no murmurs and no lower extremity edema ABDOMEN:abdomen soft,  non-tender and normal bowel sounds Musculoskeletal:no cyanosis of digits and no clubbing    Physical Exam    LABORATORY DATA:  I have reviewed the data as listed    Latest Ref Rng & Units 09/17/2023    7:40 AM 09/03/2023    8:30 AM 08/20/2023    8:08 AM  CBC  WBC 4.0 - 10.5 K/uL 3.9  5.0  5.2   Hemoglobin 13.0 - 17.0 g/dL 7.7  8.0  7.8   Hematocrit 39.0 - 52.0 % 24.4  26.1  24.8   Platelets 150 - 400 K/uL 144  155  153         Latest Ref Rng & Units 09/17/2023    7:40 AM 09/03/2023    8:30 AM 08/20/2023    8:08 AM  CMP  Glucose 70 - 99 mg/dL 888  893  875   BUN 6 - 20 mg/dL 14  15  8    Creatinine 0.61 - 1.24 mg/dL 9.15  8.97  9.26   Sodium 135 - 145 mmol/L 141  138  138   Potassium 3.5 - 5.1 mmol/L 4.5  5.2  3.7   Chloride 98 - 111 mmol/L 110  108  107   CO2 22 - 32 mmol/L 26  27  25    Calcium  8.9 - 10.3 mg/dL 8.9  9.1  8.6   Total Protein 6.5 - 8.1 g/dL 6.9  7.2  6.9   Total Bilirubin 0.0 - 1.2 mg/dL 0.4  0.5  0.5   Alkaline Phos 38 - 126 U/L 79  85  94   AST 15 - 41 U/L 22  27  25    ALT 0 - 44 U/L 11  13  13        RADIOGRAPHIC STUDIES: I have personally reviewed the radiological images as listed and agreed with the findings in the report. No results found.    No orders of the defined types were placed in this encounter.  All questions were answered. The patient knows to call the clinic with any problems, questions or concerns. No barriers to learning was detected. The total time spent in the appointment was 25 minutes, including review of chart and various tests results, discussions about plan of care and coordination of care plan     Onita Mattock, MD 09/17/2023

## 2023-09-17 NOTE — Patient Instructions (Signed)
 [facility] - A DEPT OF [facility]  Discharge Instructions: Thank you for choosing [facility] to provide your oncology and hematology care.   If you have a lab appointment with the Cancer Center, please go directly to the Cancer Center and check in at the registration area.   Wear comfortable clothing and clothing appropriate for easy access to any Portacath or PICC line.   We strive to give you quality time with your provider. You may need to reschedule your appointment if you arrive late (15 or more minutes).  Arriving late affects you and other patients whose appointments are after yours.  Also, if you miss three or more appointments without notifying the office, you may be dismissed from the clinic at the provider's discretion.      For prescription refill requests, have your pharmacy contact our office and allow 72 hours for refills to be completed.    Today you received the following chemotherapy and/or immunotherapy agents opdivo  cyramza  and taxol       To help prevent nausea and vomiting after your treatment, we encourage you to take your nausea medication as directed.  BELOW ARE SYMPTOMS THAT SHOULD BE REPORTED IMMEDIATELY: *FEVER GREATER THAN 100.4 F (38 C) OR HIGHER *CHILLS OR SWEATING *NAUSEA AND VOMITING THAT IS NOT CONTROLLED WITH YOUR NAUSEA MEDICATION *UNUSUAL SHORTNESS OF BREATH *UNUSUAL BRUISING OR BLEEDING *URINARY PROBLEMS (pain or burning when urinating, or frequent urination) *BOWEL PROBLEMS (unusual diarrhea, constipation, pain near the anus) TENDERNESS IN MOUTH AND THROAT WITH OR WITHOUT PRESENCE OF ULCERS (sore throat, sores in mouth, or a toothache) UNUSUAL RASH, SWELLING OR PAIN  UNUSUAL VAGINAL DISCHARGE OR ITCHING   Items with * indicate a potential emergency and should be followed up as soon as possible or go to the Emergency Department if any problems should occur.  Please show the CHEMOTHERAPY ALERT CARD or  IMMUNOTHERAPY ALERT CARD at check-in to the Emergency Department and triage nurse.  Should you have questions after your visit or need to cancel or reschedule your appointment, please contact [facility] - A DEPT [facility]  Dept: [phone]  and follow the prompts.  Office hours are 8:00 a.m. to 4:30 p.m. Monday - Friday. Please note that voicemails left after 4:00 p.m. may not be returned until the following business day.  We are closed weekends and major holidays. You have access to a nurse at all times for urgent questions. Please call the main number to the clinic Dept: [phone] and follow the prompts.   For any non-urgent questions, you may also contact your provider using MyChart. We now offer e-Visits for anyone 15 and older to request care online for non-urgent symptoms. For details visit [email or web address].   Also download the MyChart app! Go to the app store, search MyChart, open the app, select [place], and log in with your MyChart username and password.

## 2023-09-18 LAB — T4: T4, Total: 6.9 ug/dL (ref 4.5–12.0)

## 2023-09-28 ENCOUNTER — Telehealth: Payer: Self-pay

## 2023-09-28 NOTE — Telephone Encounter (Signed)
 Notified the pt that his form from [employer] was received in office today.

## 2023-09-30 ENCOUNTER — Other Ambulatory Visit: Payer: Self-pay | Admitting: Nurse Practitioner

## 2023-09-30 DIAGNOSIS — D500 Iron deficiency anemia secondary to blood loss (chronic): Secondary | ICD-10-CM

## 2023-09-30 DIAGNOSIS — C163 Malignant neoplasm of pyloric antrum: Secondary | ICD-10-CM

## 2023-09-30 NOTE — Progress Notes (Signed)
 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 MMR normal. -baseline CEA 11/01/21 significantly elevated at 1,989.58. -EUS on 11/06/21 by Dr. Burnette, staged as T3 N3 (at least stage III) -staging CT CAP 11/08/21 showed: stable gastric antrum wall thickening; increased size of upper abdominal lymph nodes; stable left adrenal nodule, 1.9 cm. -PET scan showed FDG avid lymph nodes within the gastrohepatic ligament, portacaval region, and mesentery. Tracer avid left supraclavicular lymph node and indeterminate left adrenal gland nodule with mild FDG uptake. -he underwent [place] node biopsy on 1/17 which was negative for malignant cells, but no lymphoid tissue seen on biopsy  -repeated PET on 02/07/22 after 3 months neoadjuvant chemo showed resolved left supraclavicular lymph node, and significant improvement in regional lymph nodes and the primary tumor, stable and indeterminate left adrenal nodule.  -CT adrenal protocol showed the left adrenal nodule is likely benign adenoma, no biopsy is needed. -Completed neoadjvant chemo FLOT s/p 12 cycles 11/22/21 - 04/27/2022 -restaging PET on 05/16/22 showed significantly hypermetabolic activity at the primary gastric cancer in pylorus, and a small hypermetabolic mesenteric lymph node, no other evidence of metastasis. surgeon Dr. Stevie aware of PET findings -S/p distal gastrectomy 05/20/22, path showed ypT3N3a with clear margins, 7/18 + LNs, and lymphovascular invasion identified. There was some treatment effect in a few lymph  nodes nut not in the primary tumor -due to rising tumor marker, he underwent PET scan on July 07, 2022, which unfortunately showed hypermetabolic new adenopathy in right hilar and mesentery, diffuse liver metastasis, consistent with metastatic recurrence.  I personally reviewed the PET scan images with patient -Unfortunately his disease is not curable at this stage. -The goal of chemotherapy is palliative, to prolong his life -I discussed his next generation sequencing foundation one result, which showed EGFR amplification, no other targetable mutations.  His tumor was previously tested for HER2 which was negative (0-1+).  PD-L1 CPS score was 2%, no significant benefit of immunotherapy.  -He started paclitaxel  and ramucirumab  on 07/25/2022  He developed worsening neuropathy quickly, and I had to switch his treatment to FOLFIRI on 08/29/2022, every 2 weeks, he is overall tolerating well. -restaging CT 01/19/2023 showed disease progression with new pathologic right paratracheal mediastinal lymph nodes as well as increasing size of several liver metastases. -I changed his chemo to taxol , ramucizumab and Nivo on 02/05/2023, every 2 weeks per his request  -restaging CT 04/08/2023 showed partial response in liver and mediastinal nodes  -restaging PET 07/24/2023 showed mixed response comparing to PET in 07/2022 and CT in 04/2023, with significantly improvement in liver mets and slightly progression in nodes and left adrenal glad metastasis -will continue paclitaxel , ramucirumab  and Nivo every 2 weeks    Anemia Likely from response to chemotherapy.  Hgb 7.7 and HCT 24.6 today.  Reviewed recent parameters showing to transfuse 1 to 2 units RBCs for Hgb <7.5.  He is currently asymptomatic.  Ferritin is 77 today.  Recheck CBC on Monday and treat iron  deficiency/anemia as indicated.  Health maintenance Patient has a health maintenance form which needs to be completed in for his employer by 10/06/2023.  He is requesting  lipid panel and glucose level before work to be completed and sent to his employer.  Will arrange for fasting lipids along with CBC and CMP on Monday, 10/05/2023.  Will complete form and fax to employer on all prior to 10/06/2023.  Plan Reviewed labs. - Moderate but stable anemia with Hgb 7.7 and HCT 24.6.  Ferritin 77. - CMP unremarkable. Will arrange for fasting lipids along with recheck of labs on 10/05/2023. CT CAP for restaging should be scheduled for beginning of October 2025.  This was ordered as part of today's visit. Labs and patient presentation are appropriate for treatment with chemotherapy today, paclitaxel , nivolumab , and Cyramza . Labs/flush, follow-up, and cycle 10 as scheduled.   The patient understands the plans discussed today and is in agreement with them.  He knows to contact our office if he develops concerns prior to his next appointment.  I provided 30 minutes of face-to-face time during this encounter and > 50% was spent counseling as documented under my assessment and plan.    Powell FORBES Lessen, NP  [facility] - A DEPT OF [facility] [address] [place] [address] Dept: [phone] Dept Fax: [phone]   Orders Placed This Encounter  Procedures   CT CHEST ABDOMEN PELVIS W CONTRAST    Standing Status:   Future    Expected Date:   10/15/2023    Expiration Date:   09/30/2024    If indicated for the ordered procedure, I authorize the administration of contrast media per Radiology protocol:   Yes    Does the patient have a contrast media/X-ray dye allergy?:   No    If indicated for the ordered procedure, I authorize the administration of oral contrast media per Radiology protocol:   Yes    Preferred imaging location?:   [facility]      CHIEF COMPLAINT:  CC: Gastric cancer  Current Treatment: Second line chemotherapy paclitaxel , nivolumab , and ramucirumab  every 2  weeks  INTERVAL HISTORY:  Jonathan Maynard is here today for repeat clinical assessment.  He last saw Dr. Lanny on 09/17/2023.  Has had more severe anemia with current treatment.  Has not required recent blood transfusions.  Consider blood transfusion if hemoglobin <7.5 or if symptoms worsen.  He should be taking oral iron  every day.  He reports better control of neuropathy since gabapentin  dosing was adjusted.  Is taking 2 in the morning and takes at night if needed.  He does get fatigued, but rest if needed.  He states he is now on disability, giving him self more opportunity to rest and participate in activities that are enjoyable. He denies chest pain, chest pressure, or shortness of breath. He denies headaches or visual disturbances. He denies abdominal pain, nausea, vomiting, or changes in bowel or bladder habits.   He denies fevers or chills. He denies pain. His appetite is good, improved. His weight has increased 3 pounds over last 1 to 2 weeks.  I have reviewed the past medical history, past surgical history, social history and family history with the patient and they are unchanged from previous note.  ALLERGIES:  has no known allergies.  MEDICATIONS:  Current Outpatient Medications  Medication Sig Dispense Refill   acetaminophen  (TYLENOL ) 500 MG tablet Take 1 tablet (500 mg total) by mouth 2 (two) times daily. 60 tablet 3   dexamethasone  (DECADRON ) 4 MG tablet Take 1 tablet (4 mg total) by  mouth daily. Start the day after chemotherapy for 1-3 days.  Take with food. 30 tablet 1   diclofenac  Sodium (VOLTAREN  ARTHRITIS PAIN) 1 % GEL Apply 4 g topically 4 (four) times daily. 100 g 3   gabapentin  (NEURONTIN ) 600 MG tablet Take 2 tablets (1,200 mg total) by mouth 3 (three) times daily. 180 tablet 1   magic mouthwash (nystatin , lidocaine , diphenhydrAMINE , alum & mag hydroxide) suspension Swish for 5 seconds and swallow 5 mLs by mouth 3 (three) times daily as needed for mouth pain. 140 mL 0   ondansetron   (ZOFRAN -ODT) 4 MG disintegrating tablet Dissolve 1 tablet (4 mg total) by mouth every 6 (six) hours as needed for nausea or vomiting. 30 tablet 1   pantoprazole  (PROTONIX ) 40 MG tablet Take 1 tablet (40 mg) by mouth 2 times daily. 60 tablet 6   prochlorperazine  (COMPAZINE ) 10 MG tablet Take 1 tablet (10 mg total) by mouth every 6 (six) hours as needed for nausea or vomiting. 30 tablet 2   No current facility-administered medications for this visit.    HISTORY OF PRESENT ILLNESS:   Oncology History Overview Note   Cancer Staging  Gastric cancer [facility]) Staging form: Stomach, AJCC 8th Edition - Clinical stage from 10/18/2021: Stage III (cT3, cN3a, cM0) - Signed by Jonathan Callander, MD on 11/10/2021     Gastric cancer (HCC)  10/18/2021 Procedure   EGD performed under the care of Dr. Dianna  Findings:  -A large, ulcerated, partially circumferential (involving two thirds of the lumen circumference) mass with no bleeding and stigmata of recent bleeding was found in the gastric antrum, at the pylorus and in the prepyloric region of the stomach. -Segmental severe inflammation characterized by congestion (edema) and erythema was found in the gastric antrum.   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 Negative for dysplasia.  Negative for helicobacter pylori  Mismatch Repair (MMR) Protein Imunohistochemistry (IHC): IHC Expression Result: MLH1: Preserved nuclear expression. MSH2: Preserved nuclear expression. MSH6: Preserved nuclear expression. PMS2: Preserved nuclear  expression. Interpretation: NORMAL   10/31/2021 Initial Diagnosis   Gastric cancer (HCC)   11/01/2021 Tumor Marker   CEA = 1,989.58 (^)   11/06/2021 Procedure   Upper EUS, Dr. Burnette  Impression: - Normal esophagus. - A small amount of food (residue) in the stomach. - Congested, friable (with contact bleeding) and ulcerated mucosa in the prepyloric region of the stomach. - Normal examined duodenum. - There was no evidence of significant pathology in the left lobe of the liver. - Many abnormal lymph nodes (over 10) were visualized in the gastrohepatic ligament (level 18), celiac region (level 20), perigastric region, peripancreatic region and aortocaval region. - Wall thickening was seen in the prepyloric region of the stomach. The thickening appeared to be primarily within the deep mucosa (Layer 2) but extended through the muscularis propria. - Overall constellation of findings consistent with at least T3 N3 Mx (at least stage III) gastric adenocarcinoma.   11/08/2021 Imaging   EXAM: CT CHEST, ABDOMEN, AND PELVIS WITH CONTRAST  IMPRESSION: 1. Similar irregular wall thickening of the gastric antrum, consistent with patient's known primary gastric neoplasm. 2. Increased size  of the upper abdominal lymph nodes, concerning for worsening nodal disease involvement. 3. Stable left adrenal nodule common nonspecific possibly a metastatic lesion or adenoma. 4. No evidence of metastatic disease in the chest. 5. Left-sided colonic diverticulosis without findings of acute diverticulitis. 6. Enlarged prostate gland. 7.  Aortic Atherosclerosis (ICD10-I70.0).   11/22/2021 - 04/26/2022 Chemotherapy   Patient is on Treatment Plan : GASTROESOPHAGEAL FLOT q14d X 4 cycles     11/27/2021 Imaging    IMPRESSION: 1. The mass within the gastric antrum is mildly decreased in size when compared with 11/08/2021. There is persistent FDG uptake associated with this mass compatible with residual  tumor. 2. Persistent FDG avid lymph nodes within the gastrohepatic ligament, portacaval region, and mesentery. These are mildly decreased in size when compared with 11/08/2021. 3. Tracer avid left supraclavicular lymph node is also mildly decreased in size when compared with 11/08/2021. 4. Indeterminate left adrenal gland nodule with mild FDG uptake. This may represent a lipid poor adenoma. Metastatic disease not exclude. 5. Diffuse increased uptake throughout the bone marrow is favored to represent treatment related change. 6.  Aortic Atherosclerosis (ICD10-I70.0).     02/07/2022 Imaging    IMPRESSION: 1. No substantial change in hypermetabolism associated with the distal gastric lesion. Although difficult to discern on noncontrast CT imaging, the soft tissue component appears to be decreased since the prior PET-CT. 2. Interval resolution of the hypermetabolic left supraclavicular lymph node. 3. Interval marked decrease of the hypermetabolic gastrohepatic ligament lymphadenopathy. Similar decrease in size and hypermetabolism associated with index nodes identified previously in the 4. porta hepatis and hypogastric region. 5. Stable left adrenal nodule with slight hypermetabolism. Nonspecific finding could represent lipid poor adenoma or metastatic deposit. 6. No new suspicious hypermetabolic disease on today's study. 7.  Aortic Atherosclerosis (ICD10-I70.0).   07/25/2022 - 08/15/2022 Chemotherapy   Patient is on Treatment Plan : GASTROESOPHAGEAL Ramucirumab  D1, 15 + Paclitaxel  D1,8,15 q28d     08/29/2022 - 01/24/2023 Chemotherapy   Patient is on Treatment Plan : COLORECTAL FOLFIRI q14d     02/05/2023 -  Chemotherapy   Patient is on Treatment Plan : GASTROESOPHAGEAL Ramucirumab  D1, 15 + Paclitaxel  D1,8,15 q28d         REVIEW OF SYSTEMS:   Constitutional: Denies fevers, chills or abnormal weight loss. Fatigue. Improved appetite.  Eyes: Denies blurriness of vision Ears, nose,  mouth, throat, and face: Denies mucositis or sore throat Respiratory: Denies cough, dyspnea or wheezes Cardiovascular: Denies palpitation, chest discomfort or lower extremity swelling Gastrointestinal:  Denies nausea, heartburn or change in bowel habits Skin: Denies abnormal skin rashes Lymphatics: Denies new lymphadenopathy or easy bruising Neurological:Denies numbness, tingling or new weaknesses Behavioral/Psych: Mood is stable, no new changes  All other systems were reviewed with the patient and are negative.   VITALS:   Today's Vitals   10/01/23 0856 10/01/23 0900  BP: (!) 143/67   Pulse: 77   Resp: 17   Temp: 98.1 F (36.7 C)   TempSrc: Temporal   SpO2: 100%   Weight: 169 lb (76.7 kg)   Height: 5' 5 (1.651 m)   PainSc:  0-No pain   Body mass index is 28.12 kg/m.   Wt Readings from Last 3 Encounters:  10/01/23 169 lb (76.7 kg)  09/17/23 166 lb 12.8 oz (75.7 kg)  09/03/23 163 lb 14.4 oz (74.3 kg)    Body mass index is 28.12 kg/m.  Performance status (ECOG): 1 - Symptomatic but completely ambulatory  PHYSICAL EXAM:   GENERAL:alert,  no distress and comfortable SKIN: skin color, texture, turgor are normal, no rashes or significant lesions EYES: normal, Conjunctiva are pink and non-injected, sclera clear OROPHARYNX:no exudate, no erythema and lips, buccal mucosa, and tongue normal  NECK: supple, thyroid  normal size, non-tender, without nodularity LYMPH:  no palpable lymphadenopathy in the cervical, axillary or inguinal LUNGS: clear to auscultation and percussion with normal breathing effort HEART: regular rate & rhythm and no murmurs and no lower extremity edema ABDOMEN:abdomen soft, non-tender and normal bowel sounds Musculoskeletal:no cyanosis of digits and no clubbing  NEURO: alert & oriented x 3 with fluent speech, no focal motor/sensory deficits  LABORATORY DATA:  I have reviewed the data as listed    Component Value Date/Time   NA 138 10/05/2023 1225    K 4.4 10/05/2023 1225   CL 107 10/05/2023 1225   CO2 26 10/05/2023 1225   GLUCOSE 80 10/05/2023 1225   BUN 13 10/05/2023 1225   CREATININE 0.76 10/05/2023 1225   CALCIUM  8.6 (L) 10/05/2023 1225   PROT 6.7 10/05/2023 1225   ALBUMIN  3.9 10/05/2023 1225   AST 18 10/05/2023 1225   ALT 9 10/05/2023 1225   ALKPHOS 67 10/05/2023 1225   BILITOT 0.6 10/05/2023 1225   GFRNONAA >60 10/05/2023 1225   Lab Results  Component Value Date   WBC 4.2 10/05/2023   NEUTROABS 2.5 10/05/2023   HGB 7.5 (L) 10/05/2023   HCT 23.7 (L) 10/05/2023   MCV 84.9 10/05/2023   PLT 134 (L) 10/05/2023

## 2023-09-30 NOTE — Assessment & Plan Note (Addendum)
  cT3N3Mx, with hypermetabolic left supraclavicular node, and indeterminate adrenal nodule, MMR normal ypT3ypN3a, liver and nodes recurrence in 07/2022 -diagnosed 10/18/21 by EGD for 6 month f/u of gastric ulcers, showed mucosal intramucosal adenocarcinoma. MMR normal. -baseline CEA 11/01/21 significantly elevated at 1,989.58. -EUS on 11/06/21 by Dr. Burnette, staged as T3 N3 (at least stage III) -staging CT CAP 11/08/21 showed: stable gastric antrum wall thickening; increased size of upper abdominal lymph nodes; stable left adrenal nodule, 1.9 cm. -PET scan showed FDG avid lymph nodes within the gastrohepatic ligament, portacaval region, and mesentery. Tracer avid left supraclavicular lymph node and indeterminate left adrenal gland nodule with mild FDG uptake. -he underwent [place] node biopsy on 1/17 which was negative for malignant cells, but no lymphoid tissue seen on biopsy  -repeated PET on 02/07/22 after 3 months neoadjuvant chemo showed resolved left supraclavicular lymph node, and significant improvement in regional lymph nodes and the primary tumor, stable and indeterminate left adrenal nodule.  -CT adrenal protocol showed the left adrenal nodule is likely benign adenoma, no biopsy is needed. -Completed neoadjvant chemo FLOT s/p 12 cycles 11/22/21 - 04/27/2022 -restaging PET on 05/16/22 showed significantly hypermetabolic activity at the primary gastric cancer in pylorus, and a small hypermetabolic mesenteric lymph node, no other evidence of metastasis. surgeon Dr. Stevie aware of PET findings -S/p distal gastrectomy 05/20/22, path showed ypT3N3a with clear margins, 7/18 + LNs, and lymphovascular invasion identified. There was some treatment effect in a few lymph nodes nut not in the primary tumor -due to rising tumor marker, he underwent PET scan on July 07, 2022, which unfortunately showed hypermetabolic new adenopathy in right hilar and mesentery, diffuse liver metastasis, consistent with metastatic  recurrence.  Dr. Lanny reviewed the PET scan images with patient -Unfortunately his disease is not curable at this stage. -The goal of chemotherapy is palliative, to prolong his life -I discussed his next generation sequencing foundation one result, which showed EGFR amplification, no other targetable mutations.  His tumor was previously tested for HER2 which was negative (0-1+).  PD-L1 CPS score was 2%, no significant benefit of immunotherapy.  -He started paclitaxel  and ramucirumab  on 07/25/2022  He developed worsening neuropathy quickly, and I had to switch his treatment to FOLFIRI on 08/29/2022, every 2 weeks, he is overall tolerating well. -restaging CT 01/19/2023 showed disease progression with new pathologic right paratracheal mediastinal lymph nodes as well as increasing size of several liver metastases. -His chemo was changed to taxol , ramucizumab and Nivo on 02/05/2023, every 2 weeks per his request  -restaging CT 04/08/2023 showed partial response in liver and mediastinal nodes  -restaging PET 07/24/2023 showed mixed response comparing to PET in 07/2022 and CT in 04/2023, with significantly improvement in liver mets and slightly progression in nodes and left adrenal gland metastasis -patient with moderate and stable iron  deficiency anemia.  -will continue paclitaxel , ramucirumab  and Nivo every 2 weeks

## 2023-10-01 ENCOUNTER — Inpatient Hospital Stay (HOSPITAL_BASED_OUTPATIENT_CLINIC_OR_DEPARTMENT_OTHER): Admitting: Nurse Practitioner

## 2023-10-01 ENCOUNTER — Inpatient Hospital Stay

## 2023-10-01 ENCOUNTER — Encounter: Payer: Self-pay | Admitting: Nurse Practitioner

## 2023-10-01 ENCOUNTER — Telehealth: Payer: Self-pay | Admitting: Nurse Practitioner

## 2023-10-01 VITALS — BP 143/67 | HR 77 | Temp 98.1°F | Resp 17 | Ht 65.0 in | Wt 169.0 lb

## 2023-10-01 DIAGNOSIS — C163 Malignant neoplasm of pyloric antrum: Secondary | ICD-10-CM

## 2023-10-01 DIAGNOSIS — Z5112 Encounter for antineoplastic immunotherapy: Secondary | ICD-10-CM | POA: Diagnosis not present

## 2023-10-01 DIAGNOSIS — D500 Iron deficiency anemia secondary to blood loss (chronic): Secondary | ICD-10-CM

## 2023-10-01 LAB — CBC WITH DIFFERENTIAL (CANCER CENTER ONLY)
Abs Immature Granulocytes: 0.02 K/uL (ref 0.00–0.07)
Basophils Absolute: 0 K/uL (ref 0.0–0.1)
Basophils Relative: 1 %
Eosinophils Absolute: 0.1 K/uL (ref 0.0–0.5)
Eosinophils Relative: 2 %
HCT: 24.6 % — ABNORMAL LOW (ref 39.0–52.0)
Hemoglobin: 7.7 g/dL — ABNORMAL LOW (ref 13.0–17.0)
Immature Granulocytes: 1 %
Lymphocytes Relative: 29 %
Lymphs Abs: 1.2 K/uL (ref 0.7–4.0)
MCH: 27 pg (ref 26.0–34.0)
MCHC: 31.3 g/dL (ref 30.0–36.0)
MCV: 86.3 fL (ref 80.0–100.0)
Monocytes Absolute: 0.7 K/uL (ref 0.1–1.0)
Monocytes Relative: 15 %
Neutro Abs: 2.3 K/uL (ref 1.7–7.7)
Neutrophils Relative %: 52 %
Platelet Count: 139 K/uL — ABNORMAL LOW (ref 150–400)
RBC: 2.85 MIL/uL — ABNORMAL LOW (ref 4.22–5.81)
RDW: 25.2 % — ABNORMAL HIGH (ref 11.5–15.5)
WBC Count: 4.3 K/uL (ref 4.0–10.5)
nRBC: 0 % (ref 0.0–0.2)

## 2023-10-01 LAB — CMP (CANCER CENTER ONLY)
ALT: 10 U/L (ref 0–44)
AST: 20 U/L (ref 15–41)
Albumin: 3.8 g/dL (ref 3.5–5.0)
Alkaline Phosphatase: 75 U/L (ref 38–126)
Anion gap: 3 — ABNORMAL LOW (ref 5–15)
BUN: 10 mg/dL (ref 6–20)
CO2: 28 mmol/L (ref 22–32)
Calcium: 8.6 mg/dL — ABNORMAL LOW (ref 8.9–10.3)
Chloride: 108 mmol/L (ref 98–111)
Creatinine: 0.77 mg/dL (ref 0.61–1.24)
GFR, Estimated: >60 mL/min (ref >60)
Glucose, Bld: 81 mg/dL (ref 70–99)
Potassium: 4.2 mmol/L (ref 3.5–5.1)
Sodium: 139 mmol/L (ref 135–145)
Total Bilirubin: 0.5 mg/dL (ref 0.0–1.2)
Total Protein: 6.6 g/dL (ref 6.5–8.1)

## 2023-10-01 LAB — TOTAL PROTEIN, URINE DIPSTICK: Protein, ur: 30 mg/dL — AB

## 2023-10-01 LAB — SAMPLE TO BLOOD BANK

## 2023-10-01 LAB — FERRITIN: Ferritin: 77 ng/mL (ref 24–336)

## 2023-10-01 LAB — CEA (ACCESS): CEA (CHCC): 472.67 ng/mL — ABNORMAL HIGH (ref 0.00–5.00)

## 2023-10-01 MED ORDER — DIPHENHYDRAMINE HCL 50 MG/ML IJ SOLN
25.0000 mg | Freq: Once | INTRAMUSCULAR | Status: AC
  Administered 2023-10-01: 25 mg via INTRAVENOUS
  Filled 2023-10-01: qty 1

## 2023-10-01 MED ORDER — ONDANSETRON HCL 4 MG/2ML IJ SOLN
8.0000 mg | Freq: Once | INTRAMUSCULAR | Status: AC
  Administered 2023-10-01: 8 mg via INTRAVENOUS
  Filled 2023-10-01: qty 4

## 2023-10-01 MED ORDER — FAMOTIDINE IN NACL 20-0.9 MG/50ML-% IV SOLN
20.0000 mg | Freq: Once | INTRAVENOUS | Status: AC
  Administered 2023-10-01: 20 mg via INTRAVENOUS
  Filled 2023-10-01: qty 50

## 2023-10-01 MED ORDER — SODIUM CHLORIDE 0.9 % IV SOLN
8.0000 mg/kg | Freq: Once | INTRAVENOUS | Status: AC
  Administered 2023-10-01: 600 mg via INTRAVENOUS
  Filled 2023-10-01: qty 10

## 2023-10-01 MED ORDER — SODIUM CHLORIDE 0.9 % IV SOLN: INTRAVENOUS | Status: DC

## 2023-10-01 MED ORDER — ACETAMINOPHEN 325 MG PO TABS
650.0000 mg | ORAL_TABLET | Freq: Once | ORAL | Status: AC
  Administered 2023-10-01: 650 mg via ORAL
  Filled 2023-10-01: qty 2

## 2023-10-01 MED ORDER — SODIUM CHLORIDE 0.9 % IV SOLN
240.0000 mg | Freq: Once | INTRAVENOUS | Status: AC
  Administered 2023-10-01: 240 mg via INTRAVENOUS
  Filled 2023-10-01: qty 24

## 2023-10-01 MED ORDER — SODIUM CHLORIDE 0.9 % IV SOLN
80.0000 mg/m2 | Freq: Once | INTRAVENOUS | Status: AC
  Administered 2023-10-01: 150 mg via INTRAVENOUS
  Filled 2023-10-01: qty 25

## 2023-10-01 NOTE — Patient Instructions (Signed)

## 2023-10-01 NOTE — Patient Instructions (Signed)
 [facility] - A DEPT OF [facility]  Discharge Instructions: Thank you for choosing [facility] to provide your oncology and hematology care.   If you have a lab appointment with the Cancer Center, please go directly to the Cancer Center and check in at the registration area.   Wear comfortable clothing and clothing appropriate for easy access to any Portacath or PICC line.   We strive to give you quality time with your provider. You may need to reschedule your appointment if you arrive late (15 or more minutes).  Arriving late affects you and other patients whose appointments are after yours.  Also, if you miss three or more appointments without notifying the office, you may be dismissed from the clinic at the provider's discretion.      For prescription refill requests, have your pharmacy contact our office and allow 72 hours for refills to be completed.    Today you received the following chemotherapy and/or immunotherapy agents opdivo  cyramza  and taxol       To help prevent nausea and vomiting after your treatment, we encourage you to take your nausea medication as directed.  BELOW ARE SYMPTOMS THAT SHOULD BE REPORTED IMMEDIATELY: *FEVER GREATER THAN 100.4 F (38 C) OR HIGHER *CHILLS OR SWEATING *NAUSEA AND VOMITING THAT IS NOT CONTROLLED WITH YOUR NAUSEA MEDICATION *UNUSUAL SHORTNESS OF BREATH *UNUSUAL BRUISING OR BLEEDING *URINARY PROBLEMS (pain or burning when urinating, or frequent urination) *BOWEL PROBLEMS (unusual diarrhea, constipation, pain near the anus) TENDERNESS IN MOUTH AND THROAT WITH OR WITHOUT PRESENCE OF ULCERS (sore throat, sores in mouth, or a toothache) UNUSUAL RASH, SWELLING OR PAIN  UNUSUAL VAGINAL DISCHARGE OR ITCHING   Items with * indicate a potential emergency and should be followed up as soon as possible or go to the Emergency Department if any problems should occur.  Please show the CHEMOTHERAPY ALERT CARD or  IMMUNOTHERAPY ALERT CARD at check-in to the Emergency Department and triage nurse.  Should you have questions after your visit or need to cancel or reschedule your appointment, please contact [facility] - A DEPT [facility]  Dept: [phone]  and follow the prompts.  Office hours are 8:00 a.m. to 4:30 p.m. Monday - Friday. Please note that voicemails left after 4:00 p.m. may not be returned until the following business day.  We are closed weekends and major holidays. You have access to a nurse at all times for urgent questions. Please call the main number to the clinic Dept: [phone] and follow the prompts.   For any non-urgent questions, you may also contact your provider using MyChart. We now offer e-Visits for anyone 15 and older to request care online for non-urgent symptoms. For details visit [email or web address].   Also download the MyChart app! Go to the app store, search MyChart, open the app, select [place], and log in with your MyChart username and password.

## 2023-10-01 NOTE — Telephone Encounter (Signed)
 Pts spouse is aware of his lab appt scheduled on 9/29

## 2023-10-02 ENCOUNTER — Ambulatory Visit

## 2023-10-02 ENCOUNTER — Other Ambulatory Visit: Payer: Self-pay

## 2023-10-02 DIAGNOSIS — D649 Anemia, unspecified: Secondary | ICD-10-CM

## 2023-10-02 DIAGNOSIS — D500 Iron deficiency anemia secondary to blood loss (chronic): Secondary | ICD-10-CM

## 2023-10-02 DIAGNOSIS — C163 Malignant neoplasm of pyloric antrum: Secondary | ICD-10-CM

## 2023-10-04 ENCOUNTER — Other Ambulatory Visit: Payer: Self-pay | Admitting: Nurse Practitioner

## 2023-10-04 DIAGNOSIS — C163 Malignant neoplasm of pyloric antrum: Secondary | ICD-10-CM

## 2023-10-05 ENCOUNTER — Other Ambulatory Visit: Payer: Self-pay

## 2023-10-05 ENCOUNTER — Inpatient Hospital Stay

## 2023-10-05 DIAGNOSIS — C163 Malignant neoplasm of pyloric antrum: Secondary | ICD-10-CM

## 2023-10-05 DIAGNOSIS — D649 Anemia, unspecified: Secondary | ICD-10-CM

## 2023-10-05 DIAGNOSIS — D500 Iron deficiency anemia secondary to blood loss (chronic): Secondary | ICD-10-CM

## 2023-10-05 DIAGNOSIS — Z5112 Encounter for antineoplastic immunotherapy: Secondary | ICD-10-CM | POA: Diagnosis not present

## 2023-10-05 LAB — CMP (CANCER CENTER ONLY)
ALT: 9 U/L (ref 0–44)
AST: 18 U/L (ref 15–41)
Albumin: 3.9 g/dL (ref 3.5–5.0)
Alkaline Phosphatase: 67 U/L (ref 38–126)
Anion gap: 5 (ref 5–15)
BUN: 13 mg/dL (ref 6–20)
CO2: 26 mmol/L (ref 22–32)
Calcium: 8.6 mg/dL — ABNORMAL LOW (ref 8.9–10.3)
Chloride: 107 mmol/L (ref 98–111)
Creatinine: 0.76 mg/dL (ref 0.61–1.24)
GFR, Estimated: >60 mL/min (ref >60)
Glucose, Bld: 80 mg/dL (ref 70–99)
Potassium: 4.4 mmol/L (ref 3.5–5.1)
Sodium: 138 mmol/L (ref 135–145)
Total Bilirubin: 0.6 mg/dL (ref 0.0–1.2)
Total Protein: 6.7 g/dL (ref 6.5–8.1)

## 2023-10-05 LAB — CBC WITH DIFFERENTIAL (CANCER CENTER ONLY)
Abs Immature Granulocytes: 0.01 K/uL (ref 0.00–0.07)
Basophils Absolute: 0 K/uL (ref 0.0–0.1)
Basophils Relative: 1 %
Eosinophils Absolute: 0.1 K/uL (ref 0.0–0.5)
Eosinophils Relative: 1 %
HCT: 23.7 % — ABNORMAL LOW (ref 39.0–52.0)
Hemoglobin: 7.5 g/dL — ABNORMAL LOW (ref 13.0–17.0)
Immature Granulocytes: 0 %
Lymphocytes Relative: 33 %
Lymphs Abs: 1.4 K/uL (ref 0.7–4.0)
MCH: 26.9 pg (ref 26.0–34.0)
MCHC: 31.6 g/dL (ref 30.0–36.0)
MCV: 84.9 fL (ref 80.0–100.0)
Monocytes Absolute: 0.2 K/uL (ref 0.1–1.0)
Monocytes Relative: 4 %
Neutro Abs: 2.5 K/uL (ref 1.7–7.7)
Neutrophils Relative %: 61 %
Platelet Count: 134 K/uL — ABNORMAL LOW (ref 150–400)
RBC: 2.79 MIL/uL — ABNORMAL LOW (ref 4.22–5.81)
RDW: 25.2 % — ABNORMAL HIGH (ref 11.5–15.5)
WBC Count: 4.2 K/uL (ref 4.0–10.5)
nRBC: 0 % (ref 0.0–0.2)

## 2023-10-05 LAB — LIPID PANEL
Cholesterol: 167 mg/dL (ref 0–200)
HDL: 32 mg/dL — ABNORMAL LOW (ref >40)
LDL Cholesterol: 105 mg/dL — ABNORMAL HIGH (ref 0–99)
Total CHOL/HDL Ratio: 5.3 ratio
Triglycerides: 150 mg/dL — ABNORMAL HIGH (ref <150)
VLDL: 30 mg/dL (ref 0–40)

## 2023-10-05 LAB — SAMPLE TO BLOOD BANK

## 2023-10-05 LAB — PREPARE RBC (CROSSMATCH)

## 2023-10-05 LAB — CEA (ACCESS): CEA (CHCC): 483.94 ng/mL — ABNORMAL HIGH (ref 0.00–5.00)

## 2023-10-05 NOTE — Progress Notes (Signed)
 Called Blood bank and they could see order for 1 unit of blood and type and screen spoke with [facility] in the [place]. Patient will receive 1 unit of PRBC on 10/07/2023 as per Dr. Lanny.

## 2023-10-07 ENCOUNTER — Inpatient Hospital Stay: Attending: Physician Assistant

## 2023-10-07 DIAGNOSIS — Z79620 Long term (current) use of immunosuppressive biologic: Secondary | ICD-10-CM | POA: Insufficient documentation

## 2023-10-07 DIAGNOSIS — Z5112 Encounter for antineoplastic immunotherapy: Secondary | ICD-10-CM | POA: Diagnosis present

## 2023-10-07 DIAGNOSIS — G620 Drug-induced polyneuropathy: Secondary | ICD-10-CM | POA: Insufficient documentation

## 2023-10-07 DIAGNOSIS — C163 Malignant neoplasm of pyloric antrum: Secondary | ICD-10-CM | POA: Insufficient documentation

## 2023-10-07 DIAGNOSIS — E278 Other specified disorders of adrenal gland: Secondary | ICD-10-CM | POA: Insufficient documentation

## 2023-10-07 DIAGNOSIS — T451X5D Adverse effect of antineoplastic and immunosuppressive drugs, subsequent encounter: Secondary | ICD-10-CM | POA: Diagnosis not present

## 2023-10-07 DIAGNOSIS — D6481 Anemia due to antineoplastic chemotherapy: Secondary | ICD-10-CM | POA: Diagnosis not present

## 2023-10-07 DIAGNOSIS — R530 Neoplastic (malignant) related fatigue: Secondary | ICD-10-CM | POA: Insufficient documentation

## 2023-10-07 DIAGNOSIS — Z79899 Other long term (current) drug therapy: Secondary | ICD-10-CM | POA: Diagnosis not present

## 2023-10-07 DIAGNOSIS — C787 Secondary malignant neoplasm of liver and intrahepatic bile duct: Secondary | ICD-10-CM | POA: Insufficient documentation

## 2023-10-07 DIAGNOSIS — Z5111 Encounter for antineoplastic chemotherapy: Secondary | ICD-10-CM | POA: Insufficient documentation

## 2023-10-07 DIAGNOSIS — Z7952 Long term (current) use of systemic steroids: Secondary | ICD-10-CM | POA: Diagnosis not present

## 2023-10-07 MED ORDER — SODIUM CHLORIDE 0.9% IV SOLUTION: 250.0000 mL | INTRAVENOUS | Status: DC

## 2023-10-07 NOTE — Patient Instructions (Signed)

## 2023-10-07 NOTE — Progress Notes (Signed)
 Per Dr. Lanny, OK To Run PRBCs at 378ml/hr today.

## 2023-10-08 LAB — TYPE AND SCREEN
ABO/RH(D): O POS
Antibody Screen: NEGATIVE
Unit division: 0

## 2023-10-08 LAB — BPAM RBC
Blood Product Expiration Date: 202510312359
ISSUE DATE / TIME: 202510011423
Unit Type and Rh: 202510312359
Unit Type and Rh: 5100

## 2023-10-11 ENCOUNTER — Encounter: Payer: Self-pay | Admitting: Physician Assistant

## 2023-10-11 ENCOUNTER — Encounter: Payer: Self-pay | Admitting: Nurse Practitioner

## 2023-10-11 ENCOUNTER — Encounter: Payer: Self-pay | Admitting: Hematology

## 2023-10-12 ENCOUNTER — Ambulatory Visit (HOSPITAL_COMMUNITY)
Admission: RE | Admit: 2023-10-12 | Discharge: 2023-10-12 | Disposition: A | Discharge status: Home / Self Care | Source: Ambulatory Visit | Attending: Nurse Practitioner | Admitting: Nurse Practitioner

## 2023-10-12 DIAGNOSIS — C163 Malignant neoplasm of pyloric antrum: Secondary | ICD-10-CM | POA: Diagnosis present

## 2023-10-12 MED ORDER — IOHEXOL 300 MG/ML  SOLN
100.0000 mL | Freq: Once | INTRAMUSCULAR | Status: AC | PRN
  Administered 2023-10-12: 100 mL via INTRAVENOUS

## 2023-10-13 ENCOUNTER — Telehealth: Payer: Self-pay

## 2023-10-13 NOTE — Telephone Encounter (Signed)
 Spoke with pt wife in regards to his disability form being completed,faxed,and confirmation received. No questions or concerns to be noted.

## 2023-10-15 ENCOUNTER — Inpatient Hospital Stay

## 2023-10-15 ENCOUNTER — Inpatient Hospital Stay (HOSPITAL_BASED_OUTPATIENT_CLINIC_OR_DEPARTMENT_OTHER): Admitting: Hematology

## 2023-10-15 ENCOUNTER — Encounter: Payer: Self-pay | Admitting: Hematology

## 2023-10-15 VITALS — BP 106/60 | HR 95 | Temp 99.4°F | Resp 17 | Ht 60.0 in | Wt 165.5 lb

## 2023-10-15 DIAGNOSIS — C163 Malignant neoplasm of pyloric antrum: Secondary | ICD-10-CM | POA: Diagnosis not present

## 2023-10-15 DIAGNOSIS — Z5112 Encounter for antineoplastic immunotherapy: Secondary | ICD-10-CM | POA: Diagnosis not present

## 2023-10-15 DIAGNOSIS — D649 Anemia, unspecified: Secondary | ICD-10-CM

## 2023-10-15 DIAGNOSIS — D500 Iron deficiency anemia secondary to blood loss (chronic): Secondary | ICD-10-CM

## 2023-10-15 LAB — CBC WITH DIFFERENTIAL (CANCER CENTER ONLY)
Abs Immature Granulocytes: 0.02 K/uL (ref 0.00–0.07)
Basophils Absolute: 0 K/uL (ref 0.0–0.1)
Basophils Relative: 1 %
Eosinophils Absolute: 0.1 K/uL (ref 0.0–0.5)
Eosinophils Relative: 2 %
HCT: 28.3 % — ABNORMAL LOW (ref 39.0–52.0)
Hemoglobin: 8.9 g/dL — ABNORMAL LOW (ref 13.0–17.0)
Immature Granulocytes: 0 %
Lymphocytes Relative: 25 %
Lymphs Abs: 1.4 K/uL (ref 0.7–4.0)
MCH: 27.2 pg (ref 26.0–34.0)
MCHC: 31.4 g/dL (ref 30.0–36.0)
MCV: 86.5 fL (ref 80.0–100.0)
Monocytes Absolute: 0.8 K/uL (ref 0.1–1.0)
Monocytes Relative: 15 %
Neutro Abs: 3.2 K/uL (ref 1.7–7.7)
Neutrophils Relative %: 57 %
Platelet Count: 154 K/uL (ref 150–400)
RBC: 3.27 MIL/uL — ABNORMAL LOW (ref 4.22–5.81)
RDW: 24.4 % — ABNORMAL HIGH (ref 11.5–15.5)
WBC Count: 5.6 K/uL (ref 4.0–10.5)
nRBC: 0 % (ref 0.0–0.2)

## 2023-10-15 LAB — CMP (CANCER CENTER ONLY)
ALT: 11 U/L (ref 0–44)
AST: 24 U/L (ref 15–41)
Albumin: 4 g/dL (ref 3.5–5.0)
Alkaline Phosphatase: 78 U/L (ref 38–126)
Anion gap: 4 — ABNORMAL LOW (ref 5–15)
BUN: 12 mg/dL (ref 6–20)
CO2: 28 mmol/L (ref 22–32)
Calcium: 9.2 mg/dL (ref 8.9–10.3)
Chloride: 109 mmol/L (ref 98–111)
Creatinine: 0.88 mg/dL (ref 0.61–1.24)
GFR, Estimated: >60 mL/min (ref >60)
Glucose, Bld: 84 mg/dL (ref 70–99)
Potassium: 4.5 mmol/L (ref 3.5–5.1)
Sodium: 141 mmol/L (ref 135–145)
Total Bilirubin: 0.5 mg/dL (ref 0.0–1.2)
Total Protein: 7.2 g/dL (ref 6.5–8.1)

## 2023-10-15 LAB — TOTAL PROTEIN, URINE DIPSTICK: Protein, ur: 100 mg/dL — AB

## 2023-10-15 LAB — FERRITIN: Ferritin: 69 ng/mL (ref 24–336)

## 2023-10-15 LAB — CEA (ACCESS): CEA (CHCC): 511.92 ng/mL — ABNORMAL HIGH (ref 0.00–5.00)

## 2023-10-15 LAB — SAMPLE TO BLOOD BANK

## 2023-10-15 LAB — TSH: TSH: 2.47 u[IU]/mL (ref 0.350–4.500)

## 2023-10-15 MED ORDER — ONDANSETRON HCL 4 MG/2ML IJ SOLN
8.0000 mg | Freq: Once | INTRAMUSCULAR | Status: AC
  Administered 2023-10-15: 8 mg via INTRAVENOUS
  Filled 2023-10-15: qty 4

## 2023-10-15 MED ORDER — SODIUM CHLORIDE 0.9 % IV SOLN
80.0000 mg/m2 | Freq: Once | INTRAVENOUS | Status: AC
  Administered 2023-10-15: 150 mg via INTRAVENOUS
  Filled 2023-10-15: qty 25

## 2023-10-15 MED ORDER — ACETAMINOPHEN 325 MG PO TABS
650.0000 mg | ORAL_TABLET | Freq: Once | ORAL | Status: AC
  Administered 2023-10-15: 650 mg via ORAL
  Filled 2023-10-15: qty 2

## 2023-10-15 MED ORDER — SODIUM CHLORIDE 0.9 % IV SOLN
240.0000 mg | Freq: Once | INTRAVENOUS | Status: AC
  Administered 2023-10-15: 240 mg via INTRAVENOUS
  Filled 2023-10-15: qty 24

## 2023-10-15 MED ORDER — DEXAMETHASONE 4 MG PO TABS: 4.0000 mg | ORAL_TABLET | Freq: Every day | ORAL | 1 refills | Status: AC

## 2023-10-15 MED ORDER — SODIUM CHLORIDE 0.9 % IV SOLN
8.0000 mg/kg | Freq: Once | INTRAVENOUS | Status: AC
  Administered 2023-10-15: 600 mg via INTRAVENOUS
  Filled 2023-10-15: qty 10

## 2023-10-15 MED ORDER — SODIUM CHLORIDE 0.9 % IV SOLN: INTRAVENOUS | Status: DC

## 2023-10-15 MED ORDER — DIPHENHYDRAMINE HCL 50 MG/ML IJ SOLN
25.0000 mg | Freq: Once | INTRAMUSCULAR | Status: AC
  Administered 2023-10-15: 25 mg via INTRAVENOUS
  Filled 2023-10-15: qty 1

## 2023-10-15 MED ORDER — FAMOTIDINE IN NACL 20-0.9 MG/50ML-% IV SOLN
20.0000 mg | Freq: Once | INTRAVENOUS | Status: AC
  Administered 2023-10-15: 20 mg via INTRAVENOUS
  Filled 2023-10-15: qty 50

## 2023-10-15 NOTE — Patient Instructions (Addendum)
 [facility] - A DEPT OF [facility]  Discharge Instructions: Thank you for choosing [facility] to provide your oncology and hematology care.   If you have a lab appointment with the Cancer Center, please go directly to the Cancer Center and check in at the registration area.   Wear comfortable clothing and clothing appropriate for easy access to any Portacath or PICC line.   We strive to give you quality time with your provider. You may need to reschedule your appointment if you arrive late (15 or more minutes).  Arriving late affects you and other patients whose appointments are after yours.  Also, if you miss three or more appointments without notifying the office, you may be dismissed from the clinic at the provider's discretion.      For prescription refill requests, have your pharmacy contact our office and allow 72 hours for refills to be completed.    Today you received the following chemotherapy and/or immunotherapy agents Opdivo , Cyramza  and Taxol       To help prevent nausea and vomiting after your treatment, we encourage you to take your nausea medication as directed.  BELOW ARE SYMPTOMS THAT SHOULD BE REPORTED IMMEDIATELY: *FEVER GREATER THAN 100.4 F (38 C) OR HIGHER *CHILLS OR SWEATING *NAUSEA AND VOMITING THAT IS NOT CONTROLLED WITH YOUR NAUSEA MEDICATION *UNUSUAL SHORTNESS OF BREATH *UNUSUAL BRUISING OR BLEEDING *URINARY PROBLEMS (pain or burning when urinating, or frequent urination) *BOWEL PROBLEMS (unusual diarrhea, constipation, pain near the anus) TENDERNESS IN MOUTH AND THROAT WITH OR WITHOUT PRESENCE OF ULCERS (sore throat, sores in mouth, or a toothache) UNUSUAL RASH, SWELLING OR PAIN  UNUSUAL VAGINAL DISCHARGE OR ITCHING   Items with * indicate a potential emergency and should be followed up as soon as possible or go to the Emergency Department if any problems should occur.  Please show the CHEMOTHERAPY ALERT CARD or  IMMUNOTHERAPY ALERT CARD at check-in to the Emergency Department and triage nurse.  Should you have questions after your visit or need to cancel or reschedule your appointment, please contact [facility] - A [facility]  Dept: [phone]  and follow the prompts.  Office hours are 8:00 a.m. to 4:30 p.m. Monday - Friday. Please note that voicemails left after 4:00 p.m. may not be returned until the following business day.  We are closed weekends and major holidays. You have access to a nurse at all times for urgent questions. Please call the main number to the clinic Dept: [phone] and follow the prompts.   For any non-urgent questions, you may also contact your provider using MyChart. We now offer e-Visits for anyone 78 and older to request care online for non-urgent symptoms. For details visit [email or web address].   Also download the MyChart app! Go to the app store, search MyChart, open the app, select [place], and log in with your MyChart username and password.

## 2023-10-15 NOTE — Addendum Note (Signed)
 Addended by: TAMELA RAISIN R on: 10/15/2023 03:37 PM   Modules accepted: Orders

## 2023-10-15 NOTE — Assessment & Plan Note (Signed)
  cT3N3Mx, with hypermetabolic left supraclavicular node, and indeterminate adrenal nodule, MMR normal ypT3ypN3a, liver and nodes recurrence in 07/2022 -diagnosed 10/18/21 by EGD for 6 month f/u of gastric ulcers, showed mucosal intramucosal adenocarcinoma. MMR normal. -baseline CEA 11/01/21 significantly elevated at 1,989.58. -EUS on 11/06/21 by Dr. Burnette, staged as T3 N3 (at least stage III) -staging CT CAP 11/08/21 showed: stable gastric antrum wall thickening; increased size of upper abdominal lymph nodes; stable left adrenal nodule, 1.9 cm. -PET scan showed FDG avid lymph nodes within the gastrohepatic ligament, portacaval region, and mesentery. Tracer avid left supraclavicular lymph node and indeterminate left adrenal gland nodule with mild FDG uptake. -he underwent [place] node biopsy on 1/17 which was negative for malignant cells, but no lymphoid tissue seen on biopsy  -repeated PET on 02/07/22 after 3 months neoadjuvant chemo showed resolved left supraclavicular lymph node, and significant improvement in regional lymph nodes and the primary tumor, stable and indeterminate left adrenal nodule.  -CT adrenal protocol showed the left adrenal nodule is likely benign adenoma, no biopsy is needed. -Completed neoadjvant chemo FLOT s/p 12 cycles 11/22/21 - 04/27/2022 -restaging PET on 05/16/22 showed significantly hypermetabolic activity at the primary gastric cancer in pylorus, and a small hypermetabolic mesenteric lymph node, no other evidence of metastasis. surgeon Dr. Stevie aware of PET findings -S/p distal gastrectomy 05/20/22, path showed ypT3N3a with clear margins, 7/18 + LNs, and lymphovascular invasion identified. There was some treatment effect in a few lymph nodes nut not in the primary tumor -due to rising tumor marker, he underwent PET scan on July 07, 2022, which unfortunately showed hypermetabolic new adenopathy in right hilar and mesentery, diffuse liver metastasis, consistent with metastatic  recurrence.  I personally reviewed the PET scan images with patient -Unfortunately his disease is not curable at this stage. -The goal of chemotherapy is palliative, to prolong his life -I discussed his next generation sequencing foundation one result, which showed EGFR amplification, no other targetable mutations.  His tumor was previously tested for HER2 which was negative (0-1+).  PD-L1 CPS score was 2%, no significant benefit of immunotherapy.  -He started paclitaxel  and ramucirumab  on 07/25/2022  He developed worsening neuropathy quickly, and I had to switch his treatment to FOLFIRI on 08/29/2022, every 2 weeks, he is overall tolerating well. -restaging CT 01/19/2023 showed disease progression with new pathologic right paratracheal mediastinal lymph nodes as well as increasing size of several liver metastases. -I changed his chemo to taxol , ramucizumab and Nivo on 02/05/2023, every 2 weeks per his request  -restaging CT 04/08/2023 showed partial response in liver and mediastinal nodes  -restaging PET 07/24/2023 showed mixed response comparing to PET in 07/2022 and CT in 04/2023, with significantly improvement in liver mets and slightly progression in nodes and left adrenal glad metastasis -will continue paclitaxel , ramucirumab  and Nivo every 2 weeks

## 2023-10-15 NOTE — Progress Notes (Signed)
 [facility]   [phone] Fax:([phone]   Clinic Follow up Note   Patient Care Team: Pcp, No as PCP - [name], MD as Consulting Physician (Hematology and Oncology)  Date of Service:  10/15/2023  CHIEF COMPLAINT: f/u of gastric cancer  CURRENT THERAPY:  Second line chemotherapy Nivo, paclitaxel  and ramucirumab  every 2 weeks  Oncology History   Gastric cancer (HCC)  cT3N3Mx, with hypermetabolic left supraclavicular node, and indeterminate adrenal nodule, MMR normal ypT3ypN3a, liver and nodes recurrence in 07/2022 -diagnosed 10/18/21 by EGD for 6 month f/u of gastric ulcers, showed mucosal intramucosal adenocarcinoma. MMR normal. -baseline CEA 11/01/21 significantly elevated at 1,989.58. -EUS on 11/06/21 by Dr. Burnette, staged as T3 N3 (at least stage III) -staging CT CAP 11/08/21 showed: stable gastric antrum wall thickening; increased size of upper abdominal lymph nodes; stable left adrenal nodule, 1.9 cm. -PET scan showed FDG avid lymph nodes within the gastrohepatic ligament, portacaval region, and mesentery. Tracer avid left supraclavicular lymph node and indeterminate left adrenal gland nodule with mild FDG uptake. -he underwent [place] node biopsy on 1/17 which was negative for malignant cells, but no lymphoid tissue seen on biopsy  -repeated PET on 02/07/22 after 3 months neoadjuvant chemo showed resolved left supraclavicular lymph node, and significant improvement in regional lymph nodes and the primary tumor, stable and indeterminate left adrenal nodule.  -CT adrenal protocol showed the left adrenal nodule is likely benign adenoma, no biopsy is needed. -Completed neoadjvant chemo FLOT s/p 12 cycles 11/22/21 - 04/27/2022 -restaging PET on 05/16/22 showed significantly hypermetabolic activity at the primary gastric cancer in pylorus, and a small hypermetabolic mesenteric lymph node, no other evidence of metastasis. surgeon Dr. Stevie aware of PET  findings -S/p distal gastrectomy 05/20/22, path showed ypT3N3a with clear margins, 7/18 + LNs, and lymphovascular invasion identified. There was some treatment effect in a few lymph nodes nut not in the primary tumor -due to rising tumor marker, he underwent PET scan on July 07, 2022, which unfortunately showed hypermetabolic new adenopathy in right hilar and mesentery, diffuse liver metastasis, consistent with metastatic recurrence.  I personally reviewed the PET scan images with patient -Unfortunately his disease is not curable at this stage. -The goal of chemotherapy is palliative, to prolong his life -I discussed his next generation sequencing foundation one result, which showed EGFR amplification, no other targetable mutations.  His tumor was previously tested for HER2 which was negative (0-1+).  PD-L1 CPS score was 2%, no significant benefit of immunotherapy.  -He started paclitaxel  and ramucirumab  on 07/25/2022  He developed worsening neuropathy quickly, and I had to switch his treatment to FOLFIRI on 08/29/2022, every 2 weeks, he is overall tolerating well. -restaging CT 01/19/2023 showed disease progression with new pathologic right paratracheal mediastinal lymph nodes as well as increasing size of several liver metastases. 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 Slight enlargement of lymph nodes around the esophagus, but overall good response to  treatment. 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 Previous blood transfusion increased energy levels.  Fatigue related to malignancy and treatment Fatigue persists, likely due to malignancy and treatment. He is on short-term disability and has adjusted activity level. Excessive sleep may contribute to muscle wasting. - Encourage balanced activity and rest - Advise against excessive sleep to prevent muscle wasting - Encourage light exercise and household activities  Chemotherapy-induced peripheral neuropathy Peripheral neuropathy is stable with fluctuations. Risk of falls due to neuropathy. - Encourage regular exercise to manage neuropathy symptoms - Advise caution to prevent falls   Plan - Restaging CT scan findings reviewed with patient and his wife, partial response in liver, evidence of disease progression - Labs reviewed, adequate for treatment, will proceed to chemo today and continue every 2 weeks - Follow-up in 2 weeks  SUMMARY OF ONCOLOGIC HISTORY: Oncology History Overview Note   Cancer Staging  Gastric cancer [facility]) Staging form: Stomach, AJCC 8th Edition - Clinical stage from 10/18/2021: Stage III (cT3, cN3a, cM0) - Signed by [name], MD on 11/10/2021     Gastric cancer (HCC)  10/18/2021 Procedure   EGD performed under the care of Dr. Dianna  Findings:  -A large, ulcerated, partially circumferential (involving two thirds of the lumen circumference) mass with no bleeding and stigmata of recent bleeding was found in the gastric antrum, at the pylorus and in the prepyloric region of the stomach. -Segmental severe inflammation characterized by congestion (edema) and erythema was found in the gastric antrum.   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 Negative for dysplasia.  Negative for helicobacter pylori  Mismatch Repair (MMR) Protein Imunohistochemistry (IHC): IHC Expression Result: MLH1: Preserved nuclear expression. MSH2: Preserved nuclear expression. MSH6: Preserved nuclear expression. PMS2: Preserved nuclear expression. Interpretation: NORMAL   10/31/2021 Initial Diagnosis   Gastric cancer (HCC)   11/01/2021 Tumor Marker   CEA = 1,989.58 (^)   11/06/2021 Procedure   Upper EUS, Dr. Burnette  Impression: - Normal esophagus. - A small amount of food (residue) in the stomach. - Congested, friable (with contact bleeding) and ulcerated mucosa in the prepyloric region of the stomach. - Normal examined duodenum. - There was no evidence of significant pathology in the left lobe of the liver. - Many abnormal lymph nodes (over 10) were visualized in the gastrohepatic ligament (level 18), celiac region (level 20), perigastric region, peripancreatic region and aortocaval region. - Wall thickening was seen in the prepyloric region of the stomach. The thickening appeared to be primarily within the deep mucosa (Layer 2) but extended through the muscularis propria. - Overall constellation of findings consistent with at least T3 N3 Mx (at least stage III) gastric adenocarcinoma.   11/08/2021 Imaging   EXAM: CT CHEST, ABDOMEN, AND PELVIS WITH CONTRAST  IMPRESSION: 1. Similar irregular wall thickening of the gastric antrum, consistent with patient's known primary gastric neoplasm. 2.  Increased size of the upper abdominal lymph nodes, concerning for worsening nodal  disease involvement. 3. Stable left adrenal nodule common nonspecific possibly a metastatic lesion or adenoma. 4. No evidence of metastatic disease in the chest. 5. Left-sided colonic diverticulosis without findings of acute diverticulitis. 6. Enlarged prostate gland. 7.  Aortic Atherosclerosis (ICD10-I70.0).   11/22/2021 - 04/26/2022 Chemotherapy   Patient is on Treatment Plan : GASTROESOPHAGEAL FLOT q14d X 4 cycles     11/27/2021 Imaging    IMPRESSION: 1. The mass within the gastric antrum is mildly decreased in size when compared with 11/08/2021. There is persistent FDG uptake associated with this mass compatible with residual tumor. 2. Persistent FDG avid lymph nodes within the gastrohepatic ligament, portacaval region, and mesentery. These are mildly decreased in size when compared with 11/08/2021. 3. Tracer avid left supraclavicular lymph node is also mildly decreased in size when compared with 11/08/2021. 4. Indeterminate left adrenal gland nodule with mild FDG uptake. This may represent a lipid poor adenoma. Metastatic disease not exclude. 5. Diffuse increased uptake throughout the bone marrow is favored to represent treatment related change. 6.  Aortic Atherosclerosis (ICD10-I70.0).     02/07/2022 Imaging    IMPRESSION: 1. No substantial change in hypermetabolism associated with the distal gastric lesion. Although difficult to discern on noncontrast CT imaging, the soft tissue component appears to be decreased since the prior PET-CT. 2. Interval resolution of the hypermetabolic left supraclavicular lymph node. 3. Interval marked decrease of the hypermetabolic gastrohepatic ligament lymphadenopathy. Similar decrease in size and hypermetabolism associated with index nodes identified previously in the 4. porta hepatis and hypogastric region. 5. Stable left adrenal nodule with slight  hypermetabolism. Nonspecific finding could represent lipid poor adenoma or metastatic deposit. 6. No new suspicious hypermetabolic disease on today's study. 7.  Aortic Atherosclerosis (ICD10-I70.0).   07/25/2022 - 08/15/2022 Chemotherapy   Patient is on Treatment Plan : GASTROESOPHAGEAL Ramucirumab  D1, 15 + Paclitaxel  D1,8,15 q28d     08/29/2022 - 01/24/2023 Chemotherapy   Patient is on Treatment Plan : COLORECTAL FOLFIRI q14d     02/05/2023 -  Chemotherapy   Patient is on Treatment Plan : GASTROESOPHAGEAL Ramucirumab  D1, 15 + Paclitaxel  D1,8,15 q28d        Discussed the use of AI scribe software for clinical note transcription with the patient, who gave verbal consent to proceed.  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  He is on short-term disability due to his condition, with a significant reduction in physical activity. He experiences fatigue and fluctuating energy levels. A recent blood transfusion improved his energy, with blood counts now at 8.9.  He manages his condition with gabapentin , nausea medication, and steroids, though he is uncertain about his nausea medication supply and plans to check for refills. His last steroid refill was in July.  Imaging shows liver metastasis and slightly enlarged lymph nodes around the esophagus. He has not yet provided a urine sample for testing.     All other systems were reviewed with the patient and are negative.  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    SURGICAL HISTORY: Past Surgical History:  Procedure Laterality Date   BIOPSY  04/12/2021  Procedure: BIOPSY;  Surgeon: Dianna Specking, MD;  Location: [facility];  Service: Gastroenterology;;   ESOPHAGOGASTRODUODENOSCOPY N/A 04/12/2021   Procedure: ESOPHAGOGASTRODUODENOSCOPY (EGD);  Surgeon: Dianna Specking, MD;  Location: [facility];  Service: Gastroenterology;  Laterality: N/A;   ESOPHAGOGASTRODUODENOSCOPY N/A 11/06/2021   Procedure: ESOPHAGOGASTRODUODENOSCOPY (EGD);  Surgeon: Burnette Fallow, MD;  Location: [facility];  Service: Gastroenterology;  Laterality: N/A;   GASTRECTOMY N/A 05/20/2022   Procedure: OPEN PARTIAL GASTRECTOMY WITH GASTROJEJUNAL ANASTOMOSIS;  Surgeon: Stevie, Herlene Righter, MD;  Location: [facility];  Service: General;  Laterality: N/A;   HERNIA REPAIR Left    inguinal   IR IMAGING GUIDED PORT INSERTION  11/15/2021   LAPAROSCOPY N/A 05/20/2022   Procedure: LAPAROSCOPY DIAGNOSTIC;  Surgeon: Stevie Herlene Righter, MD;  Location: [facility];  Service: General;  Laterality: N/A;   UPPER ESOPHAGEAL ENDOSCOPIC ULTRASOUND (EUS) Bilateral 11/06/2021   Procedure: UPPER ESOPHAGEAL ENDOSCOPIC ULTRASOUND (EUS);  Surgeon: Burnette Fallow, MD;  Location: [facility];  Service: Gastroenterology;  Laterality: Bilateral;    I have reviewed the social history and family history with the patient and they are unchanged from previous note.  ALLERGIES:  has no known allergies.  MEDICATIONS:  Current Outpatient Medications  Medication Sig Dispense Refill   acetaminophen  (TYLENOL ) 500 MG tablet Take 1 tablet (500 mg total) by mouth 2 (two) times daily. 60 tablet 3   dexamethasone  (DECADRON ) 4 MG tablet Take 1 tablet (4 mg total) by mouth daily. Start the day after chemotherapy for 1-3 days.  Take with food. 30 tablet 1   diclofenac  Sodium (VOLTAREN  ARTHRITIS PAIN) 1 % GEL Apply 4 g topically 4 (four) times daily. 100 g 3   gabapentin  (NEURONTIN ) 600 MG tablet Take 2 tablets (1,200 mg total) by mouth 3 (three) times daily. 180 tablet 1   magic mouthwash (nystatin , lidocaine ,  diphenhydrAMINE , alum & mag hydroxide) suspension Swish for 5 seconds and swallow 5 mLs by mouth 3 (three) times daily as needed for mouth pain. 140 mL 0   ondansetron  (ZOFRAN -ODT) 4 MG disintegrating tablet Dissolve 1 tablet (4 mg total) by mouth every 6 (six) hours as needed for nausea or vomiting. 30 tablet 1   pantoprazole  (PROTONIX ) 40 MG tablet Take 1 tablet (40 mg) by mouth 2 times daily. 60 tablet 6   prochlorperazine  (COMPAZINE ) 10 MG tablet Take 1 tablet (10 mg total) by mouth every 6 (six) hours as needed for nausea or vomiting. 30 tablet 2   No current facility-administered medications for this visit.    PHYSICAL EXAMINATION: ECOG PERFORMANCE STATUS: 1 - Symptomatic but completely ambulatory  Vitals:   10/15/23 0938  BP: 106/60  Pulse: 95  Resp: 17  Temp: 99.4 F (37.4 C)  SpO2: 99%   Wt Readings from Last 3 Encounters:  10/15/23 165 lb 8 oz (75.1 kg)  10/01/23 169 lb (76.7 kg)  09/17/23 166 lb 12.8 oz (75.7 kg)     GENERAL:alert, no distress and comfortable SKIN: skin color, texture, turgor are normal, no rashes or significant lesions EYES: normal, Conjunctiva are pink and non-injected, sclera clear Musculoskeletal:no cyanosis of digits and no clubbing    Physical Exam VITALS: T- 99.4  LABORATORY DATA:  I have reviewed the data as listed    Latest Ref Rng & Units 10/15/2023    9:05 AM 10/05/2023   12:25 PM 10/01/2023    8:21 AM  CBC  WBC 4.0 - 10.5 K/uL 5.6  4.2  4.3   Hemoglobin 13.0 - 17.0 g/dL 8.9  7.5  7.7   Hematocrit 39.0 - 52.0 % 28.3  23.7  24.6   Platelets 150 - 400 K/uL 154  134  139         Latest Ref Rng & Units 10/15/2023    9:05 AM 10/05/2023   12:25 PM 10/01/2023    8:21 AM  CMP  Glucose 70 - 99 mg/dL 84  80  81   BUN 6 - 20 mg/dL 12  13  10    Creatinine 0.61 - 1.24 mg/dL 9.11  9.23  9.22   Sodium 135 - 145 mmol/L 141  138  139   Potassium 3.5 - 5.1 mmol/L 4.5  4.4  4.2   Chloride 98 - 111 mmol/L 109  107  108   CO2 22 - 32 mmol/L 28   26  28    Calcium  8.9 - 10.3 mg/dL 9.2  8.6  8.6   Total Protein 6.5 - 8.1 g/dL 7.2  6.7  6.6   Total Bilirubin 0.0 - 1.2 mg/dL 0.5  0.6  0.5   Alkaline Phos 38 - 126 U/L 78  67  75   AST 15 - 41 U/L 24  18  20    ALT 0 - 44 U/L 11  9  10        RADIOGRAPHIC STUDIES: I have personally reviewed the radiological images as listed and agreed with the findings in the report. No results found.    Orders Placed This Encounter  Procedures   CBC with Differential (Cancer Center Only)    Standing Status:   Future    Expected Date:   12/10/2023    Expiration Date:   12/09/2024   CMP (Cancer Center only)    Standing Status:   Future    Expected Date:   12/10/2023    Expiration Date:   12/09/2024   T4    Standing Status:   Future    Expected Date:   12/10/2023    Expiration Date:   12/09/2024   TSH    Standing Status:   Future    Expected Date:   12/10/2023    Expiration Date:   12/09/2024   Total Protein, Urine dipstick    Standing Status:   Future    Expected Date:   12/10/2023    Expiration Date:   12/09/2024   CBC with Differential (Cancer Center Only)    Standing Status:   Future    Expected Date:   12/24/2023    Expiration Date:   12/23/2024   CMP (Cancer Center only)    Standing Status:   Future    Expected Date:   12/24/2023    Expiration Date:   12/23/2024   All questions were answered. The patient knows to call the clinic with any problems, questions or concerns. No barriers to learning was detected. The total time spent in the appointment was 30 minutes, including review of chart and various tests results, discussions about plan of care and coordination of care plan     Onita Mattock, MD 10/15/2023

## 2023-10-16 LAB — T4: T4, Total: 6.6 ug/dL (ref 4.5–12.0)

## 2023-10-28 ENCOUNTER — Other Ambulatory Visit: Payer: Self-pay | Admitting: Hematology and Oncology

## 2023-10-28 DIAGNOSIS — C163 Malignant neoplasm of pyloric antrum: Secondary | ICD-10-CM

## 2023-10-28 NOTE — Assessment & Plan Note (Signed)
  cT3N3Mx, with hypermetabolic left supraclavicular node, and indeterminate adrenal nodule, MMR normal ypT3ypN3a, liver and nodes recurrence in 07/2022 -diagnosed 10/18/21 by EGD for 6 month f/u of gastric ulcers, showed mucosal intramucosal adenocarcinoma. MMR normal. -baseline CEA 11/01/21 significantly elevated at 1,989.58. -EUS on 11/06/21 by Dr. Burnette, staged as T3 N3 (at least stage III) -staging CT CAP 11/08/21 showed: stable gastric antrum wall thickening; increased size of upper abdominal lymph nodes; stable left adrenal nodule, 1.9 cm. -PET scan showed FDG avid lymph nodes within the gastrohepatic ligament, portacaval region, and mesentery. Tracer avid left supraclavicular lymph node and indeterminate left adrenal gland nodule with mild FDG uptake. -he underwent [place] node biopsy on 1/17 which was negative for malignant cells, but no lymphoid tissue seen on biopsy  -repeated PET on 02/07/22 after 3 months neoadjuvant chemo showed resolved left supraclavicular lymph node, and significant improvement in regional lymph nodes and the primary tumor, stable and indeterminate left adrenal nodule.  -CT adrenal protocol showed the left adrenal nodule is likely benign adenoma, no biopsy is needed. -Completed neoadjvant chemo FLOT s/p 12 cycles 11/22/21 - 04/27/2022 -restaging PET on 05/16/22 showed significantly hypermetabolic activity at the primary gastric cancer in pylorus, and a small hypermetabolic mesenteric lymph node, no other evidence of metastasis. surgeon Dr. Stevie aware of PET findings -S/p distal gastrectomy 05/20/22, path showed ypT3N3a with clear margins, 7/18 + LNs, and lymphovascular invasion identified. There was some treatment effect in a few lymph nodes nut not in the primary tumor -due to rising tumor marker, he underwent PET scan on July 07, 2022, which unfortunately showed hypermetabolic new adenopathy in right hilar and mesentery, diffuse liver metastasis, consistent with metastatic  recurrence.  I personally reviewed the PET scan images with patient -Unfortunately his disease is not curable at this stage. -The goal of chemotherapy is palliative, to prolong his life -I discussed his next generation sequencing foundation one result, which showed EGFR amplification, no other targetable mutations.  His tumor was previously tested for HER2 which was negative (0-1+).  PD-L1 CPS score was 2%, no significant benefit of immunotherapy.  -He started paclitaxel  and ramucirumab  on 07/25/2022  He developed worsening neuropathy quickly, and I had to switch his treatment to FOLFIRI on 08/29/2022, every 2 weeks, he is overall tolerating well. -restaging CT 01/19/2023 showed disease progression with new pathologic right paratracheal mediastinal lymph nodes as well as increasing size of several liver metastases. -I changed his chemo to taxol , ramucizumab and Nivo on 02/05/2023, every 2 weeks per his request  -restaging CT 04/08/2023 showed partial response in liver and mediastinal nodes  -restaging PET 07/24/2023 showed mixed response comparing to PET in 07/2022 and CT in 04/2023, with significantly improvement in liver mets and slightly progression in nodes and left adrenal glad metastasis -restaging CT 10/12/2023 showed improved liver mets and stable other node mets  -will continue paclitaxel , ramucirumab  and Nivo every 2 weeks

## 2023-10-29 ENCOUNTER — Inpatient Hospital Stay: Admitting: Hematology

## 2023-10-29 ENCOUNTER — Inpatient Hospital Stay

## 2023-10-29 VITALS — BP 154/90 | HR 73 | Temp 98.1°F | Resp 17 | Wt 173.4 lb

## 2023-10-29 DIAGNOSIS — C163 Malignant neoplasm of pyloric antrum: Secondary | ICD-10-CM | POA: Diagnosis not present

## 2023-10-29 DIAGNOSIS — D649 Anemia, unspecified: Secondary | ICD-10-CM

## 2023-10-29 DIAGNOSIS — D500 Iron deficiency anemia secondary to blood loss (chronic): Secondary | ICD-10-CM

## 2023-10-29 DIAGNOSIS — Z5112 Encounter for antineoplastic immunotherapy: Secondary | ICD-10-CM | POA: Diagnosis not present

## 2023-10-29 LAB — CBC WITH DIFFERENTIAL (CANCER CENTER ONLY)
Abs Immature Granulocytes: 0.02 K/uL (ref 0.00–0.07)
Basophils Absolute: 0 K/uL (ref 0.0–0.1)
Basophils Relative: 1 %
Eosinophils Absolute: 0.1 K/uL (ref 0.0–0.5)
Eosinophils Relative: 1 %
HCT: 27.4 % — ABNORMAL LOW (ref 39.0–52.0)
Hemoglobin: 8.5 g/dL — ABNORMAL LOW (ref 13.0–17.0)
Immature Granulocytes: 0 %
Lymphocytes Relative: 26 %
Lymphs Abs: 1.6 K/uL (ref 0.7–4.0)
MCH: 27.2 pg (ref 26.0–34.0)
MCHC: 31 g/dL (ref 30.0–36.0)
MCV: 87.5 fL (ref 80.0–100.0)
Monocytes Absolute: 0.9 K/uL (ref 0.1–1.0)
Monocytes Relative: 15 %
Neutro Abs: 3.5 K/uL (ref 1.7–7.7)
Neutrophils Relative %: 57 %
Platelet Count: 136 K/uL — ABNORMAL LOW (ref 150–400)
RBC: 3.13 MIL/uL — ABNORMAL LOW (ref 4.22–5.81)
RDW: 24.7 % — ABNORMAL HIGH (ref 11.5–15.5)
WBC Count: 6.2 K/uL (ref 4.0–10.5)
nRBC: 0 % (ref 0.0–0.2)

## 2023-10-29 LAB — CMP (CANCER CENTER ONLY)
ALT: 12 U/L (ref 0–44)
AST: 23 U/L (ref 15–41)
Albumin: 3.9 g/dL (ref 3.5–5.0)
Alkaline Phosphatase: 79 U/L (ref 38–126)
Anion gap: 4 — ABNORMAL LOW (ref 5–15)
BUN: 13 mg/dL (ref 6–20)
CO2: 28 mmol/L (ref 22–32)
Calcium: 9.1 mg/dL (ref 8.9–10.3)
Chloride: 110 mmol/L (ref 98–111)
Creatinine: 0.8 mg/dL (ref 0.61–1.24)
GFR, Estimated: >60 mL/min (ref >60)
Glucose, Bld: 98 mg/dL (ref 70–99)
Potassium: 4.3 mmol/L (ref 3.5–5.1)
Sodium: 142 mmol/L (ref 135–145)
Total Bilirubin: 0.6 mg/dL (ref 0.0–1.2)
Total Protein: 6.7 g/dL (ref 6.5–8.1)

## 2023-10-29 LAB — CEA (ACCESS): CEA (CHCC): 571.29 ng/mL — ABNORMAL HIGH (ref 0.00–5.00)

## 2023-10-29 LAB — SAMPLE TO BLOOD BANK

## 2023-10-29 MED ORDER — SODIUM CHLORIDE 0.9 % IV SOLN: INTRAVENOUS | Status: DC

## 2023-10-29 MED ORDER — SODIUM CHLORIDE 0.9 % IV SOLN
240.0000 mg | Freq: Once | INTRAVENOUS | Status: AC
  Administered 2023-10-29: 240 mg via INTRAVENOUS
  Filled 2023-10-29: qty 24

## 2023-10-29 MED ORDER — DIPHENHYDRAMINE HCL 50 MG/ML IJ SOLN
25.0000 mg | Freq: Once | INTRAMUSCULAR | Status: AC
  Administered 2023-10-29: 25 mg via INTRAVENOUS
  Filled 2023-10-29: qty 1

## 2023-10-29 MED ORDER — SODIUM CHLORIDE 0.9 % IV SOLN
8.0000 mg/kg | Freq: Once | INTRAVENOUS | Status: AC
  Administered 2023-10-29: 600 mg via INTRAVENOUS
  Filled 2023-10-29: qty 10

## 2023-10-29 MED ORDER — PALONOSETRON HCL INJECTION 0.25 MG/5ML
0.2500 mg | Freq: Once | INTRAVENOUS | Status: AC
  Administered 2023-10-29: 0.25 mg via INTRAVENOUS
  Filled 2023-10-29: qty 5

## 2023-10-29 MED ORDER — SODIUM CHLORIDE 0.9% FLUSH: 10.0000 mL | INTRAVENOUS | Status: DC | PRN

## 2023-10-29 MED ORDER — FAMOTIDINE IN NACL 20-0.9 MG/50ML-% IV SOLN
20.0000 mg | Freq: Once | INTRAVENOUS | Status: AC
  Administered 2023-10-29: 20 mg via INTRAVENOUS
  Filled 2023-10-29: qty 50

## 2023-10-29 MED ORDER — SODIUM CHLORIDE 0.9 % IV SOLN
80.0000 mg/m2 | Freq: Once | INTRAVENOUS | Status: AC
  Administered 2023-10-29: 150 mg via INTRAVENOUS
  Filled 2023-10-29: qty 25

## 2023-10-29 MED ORDER — ACETAMINOPHEN 325 MG PO TABS
650.0000 mg | ORAL_TABLET | Freq: Once | ORAL | Status: AC
  Administered 2023-10-29: 650 mg via ORAL
  Filled 2023-10-29: qty 2

## 2023-10-29 NOTE — Patient Instructions (Signed)
 [facility] - A DEPT OF [facility]  Discharge Instructions: Thank you for choosing [facility] to provide your oncology and hematology care.   If you have a lab appointment with the Cancer Center, please go directly to the Cancer Center and check in at the registration area.   Wear comfortable clothing and clothing appropriate for easy access to any Portacath or PICC line.   We strive to give you quality time with your provider. You may need to reschedule your appointment if you arrive late (15 or more minutes).  Arriving late affects you and other patients whose appointments are after yours.  Also, if you miss three or more appointments without notifying the office, you may be dismissed from the clinic at the provider's discretion.      For prescription refill requests, have your pharmacy contact our office and allow 72 hours for refills to be completed.    Today you received the following chemotherapy and/or immunotherapy agents opdivo , cyramza , taxol       To help prevent nausea and vomiting after your treatment, we encourage you to take your nausea medication as directed.  BELOW ARE SYMPTOMS THAT SHOULD BE REPORTED IMMEDIATELY: *FEVER GREATER THAN 100.4 F (38 C) OR HIGHER *CHILLS OR SWEATING *NAUSEA AND VOMITING THAT IS NOT CONTROLLED WITH YOUR NAUSEA MEDICATION *UNUSUAL SHORTNESS OF BREATH *UNUSUAL BRUISING OR BLEEDING *URINARY PROBLEMS (pain or burning when urinating, or frequent urination) *BOWEL PROBLEMS (unusual diarrhea, constipation, pain near the anus) TENDERNESS IN MOUTH AND THROAT WITH OR WITHOUT PRESENCE OF ULCERS (sore throat, sores in mouth, or a toothache) UNUSUAL RASH, SWELLING OR PAIN  UNUSUAL VAGINAL DISCHARGE OR ITCHING   Items with * indicate a potential emergency and should be followed up as soon as possible or go to the Emergency Department if any problems should occur.  Please show the CHEMOTHERAPY ALERT CARD or  IMMUNOTHERAPY ALERT CARD at check-in to the Emergency Department and triage nurse.  Should you have questions after your visit or need to cancel or reschedule your appointment, please contact [facility] - A DEPT OF [facility]  Dept: [phone]  and follow the prompts.  Office hours are 8:00 a.m. to 4:30 p.m. Monday - Friday. Please note that voicemails left after 4:00 p.m. may not be returned until the following business day.  We are closed weekends and major holidays. You have access to a nurse at all times for urgent questions. Please call the main number to the clinic Dept: [phone] and follow the prompts.   For any non-urgent questions, you may also contact your provider using MyChart. We now offer e-Visits for anyone 56 and older to request care online for non-urgent symptoms. For details visit [email or web address].   Also download the MyChart app! Go to the app store, search MyChart, open the app, select [facility], and log in with your MyChart username and password.

## 2023-10-29 NOTE — Progress Notes (Signed)
 [facility]   Telephone:(336) [phone] [phone]   Clinic Follow up Note   Patient Care Team: Pcp, No as PCP - [name], MD as Consulting Physician (Hematology and Oncology)  Date of Service:  10/29/2023  CHIEF COMPLAINT: f/u of metastatic gastric cancer  CURRENT THERAPY:  Paclitaxel  and ramucirumab  every 2 weeks  Oncology History   Gastric cancer (HCC)  cT3N3Mx, with hypermetabolic left supraclavicular node, and indeterminate adrenal nodule, MMR normal ypT3ypN3a, liver and nodes recurrence in 07/2022 -diagnosed 10/18/21 by EGD for 6 month f/u of gastric ulcers, showed mucosal intramucosal adenocarcinoma. MMR normal. -baseline CEA 11/01/21 significantly elevated at 1,989.58. -EUS on 11/06/21 by Dr. Burnette, staged as T3 N3 (at least stage III) -staging CT CAP 11/08/21 showed: stable gastric antrum wall thickening; increased size of upper abdominal lymph nodes; stable left adrenal nodule, 1.9 cm. -PET scan showed FDG avid lymph nodes within the gastrohepatic ligament, portacaval region, and mesentery. Tracer avid left supraclavicular lymph node and indeterminate left adrenal gland nodule with mild FDG uptake. -he underwent Hunter node biopsy on 1/17 which was negative for malignant cells, but no lymphoid tissue seen on biopsy  -repeated PET on 02/07/22 after 3 months neoadjuvant chemo showed resolved left supraclavicular lymph node, and significant improvement in regional lymph nodes and the primary tumor, stable and indeterminate left adrenal nodule.  -CT adrenal protocol showed the left adrenal nodule is likely benign adenoma, no biopsy is needed. -Completed neoadjvant chemo FLOT s/p 12 cycles 11/22/21 - 04/27/2022 -restaging PET on 05/16/22 showed significantly hypermetabolic activity at the primary gastric cancer in pylorus, and a small hypermetabolic mesenteric lymph node, no other evidence of metastasis. surgeon Dr. Stevie aware of PET findings -S/p distal  gastrectomy 05/20/22, path showed ypT3N3a with clear margins, 7/18 + LNs, and lymphovascular invasion identified. There was some treatment effect in a few lymph nodes nut not in the primary tumor -due to rising tumor marker, he underwent PET scan on July 07, 2022, which unfortunately showed hypermetabolic new adenopathy in right hilar and mesentery, diffuse liver metastasis, consistent with metastatic recurrence.  I personally reviewed the PET scan images with patient -Unfortunately his disease is not curable at this stage. -The goal of chemotherapy is palliative, to prolong his life -I discussed his next generation sequencing foundation one result, which showed EGFR amplification, no other targetable mutations.  His tumor was previously tested for HER2 which was negative (0-1+).  PD-L1 CPS score was 2%, no significant benefit of immunotherapy.  -He started paclitaxel  and ramucirumab  on 07/25/2022  He developed worsening neuropathy quickly, and I had to switch his treatment to FOLFIRI on 08/29/2022, every 2 weeks, he is overall tolerating well. -restaging CT 01/19/2023 showed disease progression with new pathologic right paratracheal mediastinal lymph nodes as well as increasing size of several liver metastases. -I changed his chemo to taxol , ramucizumab and Nivo on 02/05/2023, every 2 weeks per his request  -restaging CT 04/08/2023 showed partial response in liver and mediastinal nodes  -restaging PET 07/24/2023 showed mixed response comparing to PET in 07/2022 and CT in 04/2023, with significantly improvement in liver mets and slightly progression in nodes and left adrenal glad metastasis -restaging CT 10/12/2023 showed improved liver mets and stable other node mets  -will continue paclitaxel , ramucirumab  and Nivo every 2 weeks   Assessment & Plan Gastric cancer of the pyloric antrum Currently on cycle ten, day fifteen of chemotherapy. He is tolerating the regimen well, with no pain or discomfort, and has  gained weight since the last visit. Appetite and bowel movements are normal. - Continue chemotherapy as scheduled on November 6th and November 20th - Administer dexamethasone  post-chemotherapy for three days  Chemotherapy-induced peripheral neuropathy Symptoms of numbness and tingling are present. Gabapentin  600 mg twice daily is managing symptoms effectively. He reports symptoms are manageable with gabapentin . - Continue gabapentin  600 mg twice daily - Advise continued exercise and use of ice during infusions  Anemia secondary to chemotherapy Mild anemia is present with normal white and platelet counts. Previous kidney and liver function tests were normal, with a slight decrease in calcium  levels noted.  Plan - Patient is clinically stable, tolerating chemotherapy well overall - Lab is pending, if it is adequate, will proceed to chemo today - Follow-up in 2 weeks before next cycle chemo   SUMMARY OF ONCOLOGIC HISTORY: Oncology History Overview Note   Cancer Staging  Gastric cancer [facility]) Staging form: Stomach, AJCC 8th Edition - Clinical stage from 10/18/2021: Stage III (cT3, cN3a, cM0) - Signed by [name], MD on 11/10/2021     Gastric cancer (HCC)  10/18/2021 Procedure   EGD performed under the care of Dr. Dianna  Findings:  -A large, ulcerated, partially circumferential (involving two thirds of the lumen circumference) mass with no bleeding and stigmata of recent bleeding was found in the gastric antrum, at the pylorus and in the prepyloric region of the stomach. -Segmental severe inflammation characterized by congestion (edema) and erythema was found in the gastric antrum.   10/18/2021 Cancer Staging   Staging form: Stomach, AJCC 8th Edition - Clinical stage from 10/18/2021: Stage III (cT3, cN3a, cM0) - Signed by [name], MD on 11/10/2021 Stage prefix: Initial diagnosis Histologic grade (G): GX Histologic grading system: 3 grade system   10/29/2021 Pathology Results    Stomach, antrum, pylorus, biopsy: -AT LEAST MUCOSA INTRAMUCOSAL ADENOCARCINOMA ARISING WITHIN CHRONIC INACTIVE GASTRITIS WITH INTESTINAL METAPLASIA (INCOMPLETE TYPE). Negative for dysplasia.  Negative for helicobacter pylori  Mismatch Repair (MMR) Protein Imunohistochemistry (IHC): IHC Expression Result: MLH1: Preserved nuclear expression. MSH2: Preserved nuclear expression. MSH6: Preserved nuclear expression. PMS2: Preserved nuclear expression. Interpretation: NORMAL   10/31/2021 Initial Diagnosis   Gastric cancer (HCC)   11/01/2021 Tumor Marker   CEA = 1,989.58 (^)   11/06/2021 Procedure   Upper EUS, Dr. Burnette  Impression: - Normal esophagus. - A small amount of food (residue) in the stomach. - Congested, friable (with contact bleeding) and ulcerated mucosa in the prepyloric region of the stomach. - Normal examined duodenum. - There was no evidence of significant pathology in the left lobe of the liver. - Many abnormal lymph nodes (over 10) were visualized in the gastrohepatic ligament (level 18), celiac region (level 20), perigastric region, peripancreatic region and aortocaval region. - Wall thickening was seen in the prepyloric region of the stomach. The thickening appeared to be primarily within the deep mucosa (Layer 2) but extended through the muscularis propria. - Overall constellation of findings consistent with at least T3 N3 Mx (at least stage III) gastric adenocarcinoma.   11/08/2021 Imaging   EXAM: CT CHEST, ABDOMEN, AND PELVIS WITH CONTRAST  IMPRESSION: 1. Similar irregular wall thickening of the gastric antrum, consistent with patient's known primary gastric neoplasm. 2. Increased size of the upper abdominal lymph nodes, concerning for worsening nodal disease involvement. 3. Stable left adrenal nodule common nonspecific possibly a metastatic lesion or adenoma. 4. No evidence of metastatic disease in the chest. 5. Left-sided colonic diverticulosis without  findings of acute diverticulitis. 6. Enlarged  prostate gland. 7.  Aortic Atherosclerosis (ICD10-I70.0).   11/22/2021 - 04/26/2022 Chemotherapy   Patient is on Treatment Plan : GASTROESOPHAGEAL FLOT q14d X 4 cycles     11/27/2021 Imaging    IMPRESSION: 1. The mass within the gastric antrum is mildly decreased in size when compared with 11/08/2021. There is persistent FDG uptake associated with this mass compatible with residual tumor. 2. Persistent FDG avid lymph nodes within the gastrohepatic ligament, portacaval region, and mesentery. These are mildly decreased in size when compared with 11/08/2021. 3. Tracer avid left supraclavicular lymph node is also mildly decreased in size when compared with 11/08/2021. 4. Indeterminate left adrenal gland nodule with mild FDG uptake. This may represent a lipid poor adenoma. Metastatic disease not exclude. 5. Diffuse increased uptake throughout the bone marrow is favored to represent treatment related change. 6.  Aortic Atherosclerosis (ICD10-I70.0).     02/07/2022 Imaging    IMPRESSION: 1. No substantial change in hypermetabolism associated with the distal gastric lesion. Although difficult to discern on noncontrast CT imaging, the soft tissue component appears to be decreased since the prior PET-CT. 2. Interval resolution of the hypermetabolic left supraclavicular lymph node. 3. Interval marked decrease of the hypermetabolic gastrohepatic ligament lymphadenopathy. Similar decrease in size and hypermetabolism associated with index nodes identified previously in the 4. porta hepatis and hypogastric region. 5. Stable left adrenal nodule with slight hypermetabolism. Nonspecific finding could represent lipid poor adenoma or metastatic deposit. 6. No new suspicious hypermetabolic disease on today's study. 7.  Aortic Atherosclerosis (ICD10-I70.0).   07/25/2022 - 08/15/2022 Chemotherapy   Patient is on Treatment Plan : GASTROESOPHAGEAL  Ramucirumab  D1, 15 + Paclitaxel  D1,8,15 q28d     08/29/2022 - 01/24/2023 Chemotherapy   Patient is on Treatment Plan : COLORECTAL FOLFIRI q14d     02/05/2023 -  Chemotherapy   Patient is on Treatment Plan : GASTROESOPHAGEAL Ramucirumab  D1, 15 + Paclitaxel  D1,8,15 q28d        Discussed the use of AI scribe software for clinical note transcription with the patient, who gave verbal consent to proceed.  History of Present Illness Jonathan Maynard is a 53 year old male with gastric cancer who presents for follow-up.  He is on cycle ten, day fifteen of chemotherapy and experiences no pain or discomfort. He maintains a good appetite and has gained weight since the last visit. Neuropathy symptoms, including numbness and tingling, are managed with gabapentin  600 mg twice a day and ice during infusions. Dexamethasone  is taken for three days post-chemotherapy to aid energy levels.  His creatinine and BUN were normal at the last check. He has a history of anemia and has received blood transfusions in the past. His white blood cell and platelet counts were normal at the last check.  He is not currently working and stays active by walking around his house and yard.     All other systems were reviewed with the patient and are negative.  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    SURGICAL HISTORY: Past Surgical History:  Procedure Laterality Date   BIOPSY  04/12/2021   Procedure: BIOPSY;  Surgeon: Dianna Specking, MD;  Location: [facility];  Service: Gastroenterology;;  ESOPHAGOGASTRODUODENOSCOPY N/A 04/12/2021   Procedure: ESOPHAGOGASTRODUODENOSCOPY (EGD);  Surgeon: Dianna Specking, MD;  Location:  [facility];  Service: Gastroenterology;  Laterality: N/A;   ESOPHAGOGASTRODUODENOSCOPY N/A 11/06/2021   Procedure: ESOPHAGOGASTRODUODENOSCOPY (EGD);  Surgeon: Burnette Fallow, MD;  Location: [facility];  Service: Gastroenterology;  Laterality: N/A;   GASTRECTOMY N/A 05/20/2022   Procedure: OPEN PARTIAL GASTRECTOMY WITH GASTROJEJUNAL ANASTOMOSIS;  Surgeon: Stevie, Herlene Righter, MD;  Location: [facility];  Service: General;  Laterality: N/A;   HERNIA REPAIR Left    inguinal   IR IMAGING GUIDED PORT INSERTION  11/15/2021   LAPAROSCOPY N/A 05/20/2022   Procedure: LAPAROSCOPY DIAGNOSTIC;  Surgeon: Stevie Herlene Righter, MD;  Location: [facility];  Service: General;  Laterality: N/A;   UPPER ESOPHAGEAL ENDOSCOPIC ULTRASOUND (EUS) Bilateral 11/06/2021   Procedure: UPPER ESOPHAGEAL ENDOSCOPIC ULTRASOUND (EUS);  Surgeon: Burnette Fallow, MD;  Location: [facility];  Service: Gastroenterology;  Laterality: Bilateral;    I have reviewed the social history and family history with the patient and they are unchanged from previous note.  ALLERGIES:  has no known allergies.  MEDICATIONS:  Current Outpatient Medications  Medication Sig Dispense Refill   acetaminophen  (TYLENOL ) 500 MG tablet Take 1 tablet (500 mg total) by mouth 2 (two) times daily. 60 tablet 3   dexamethasone  (DECADRON ) 4 MG tablet Take 1 tablet (4 mg total) by mouth daily. Start the day after chemotherapy for 1-3 days.  Take with food. 30 tablet 1   diclofenac  Sodium (VOLTAREN  ARTHRITIS PAIN) 1 % GEL Apply 4 g topically 4 (four) times daily. 100 g 3   gabapentin  (NEURONTIN ) 600 MG tablet Take 2 tablets (1,200 mg total) by mouth 3 (three) times daily. 180 tablet 1   magic mouthwash (nystatin , lidocaine , diphenhydrAMINE , alum & mag hydroxide) suspension Swish for 5 seconds and swallow 5 mLs by mouth 3 (three) times daily as needed for mouth pain. 140 mL 0   ondansetron  (ZOFRAN -ODT) 4 MG disintegrating tablet Dissolve 1 tablet (4 mg total) by  mouth every 6 (six) hours as needed for nausea or vomiting. 30 tablet 1   pantoprazole  (PROTONIX ) 40 MG tablet Take 1 tablet (40 mg) by mouth 2 times daily. 60 tablet 6   prochlorperazine  (COMPAZINE ) 10 MG tablet Take 1 tablet (10 mg total) by mouth every 6 (six) hours as needed for nausea or vomiting. 30 tablet 2   No current facility-administered medications for this visit.    PHYSICAL EXAMINATION: ECOG PERFORMANCE STATUS: 1 - Symptomatic but completely ambulatory  Vitals:   10/29/23 0754 10/29/23 0756  BP: (!) 160/90 (!) 154/90  Pulse:  73  Resp:  17  Temp:  98.1 F (36.7 C)  SpO2:  99%   Wt Readings from Last 3 Encounters:  10/29/23 173 lb 6.4 oz (78.7 kg)  10/15/23 165 lb 8 oz (75.1 kg)  10/01/23 169 lb (76.7 kg)     GENERAL:alert, no distress and comfortable SKIN: skin color, texture, turgor are normal, no rashes or significant lesions EYES: normal, Conjunctiva are pink and non-injected, sclera clear NECK: supple, thyroid  normal size, non-tender, without nodularity LYMPH:  no palpable lymphadenopathy in the cervical, axillary  LUNGS: clear to auscultation and percussion with normal breathing effort HEART: regular rate & rhythm and no murmurs and no lower extremity edema ABDOMEN:abdomen soft, non-tender and normal bowel sounds Musculoskeletal:no cyanosis of digits and no clubbing  NEURO: alert & oriented x 3 with fluent speech, no focal motor/sensory deficits  Physical Exam    LABORATORY DATA:  I have reviewed  the data as listed    Latest Ref Rng & Units 10/29/2023    7:30 AM 10/15/2023    9:05 AM 10/05/2023   12:25 PM  CBC  WBC 4.0 - 10.5 K/uL 6.2  5.6  4.2   Hemoglobin 13.0 - 17.0 g/dL 8.5  8.9  7.5   Hematocrit 39.0 - 52.0 % 27.4  28.3  23.7   Platelets 150 - 400 K/uL 136  154  134         Latest Ref Rng & Units 10/15/2023    9:05 AM 10/05/2023   12:25 PM 10/01/2023    8:21 AM  CMP  Glucose 70 - 99 mg/dL 84  80  81   BUN 6 - 20 mg/dL 12  13  10     Creatinine 0.61 - 1.24 mg/dL 9.11  9.23  9.22   Sodium 135 - 145 mmol/L 141  138  139   Potassium 3.5 - 5.1 mmol/L 4.5  4.4  4.2   Chloride 98 - 111 mmol/L 109  107  108   CO2 22 - 32 mmol/L 28  26  28    Calcium  8.9 - 10.3 mg/dL 9.2  8.6  8.6   Total Protein 6.5 - 8.1 g/dL 7.2  6.7  6.6   Total Bilirubin 0.0 - 1.2 mg/dL 0.5  0.6  0.5   Alkaline Phos 38 - 126 U/L 78  67  75   AST 15 - 41 U/L 24  18  20    ALT 0 - 44 U/L 11  9  10        RADIOGRAPHIC STUDIES: I have personally reviewed the radiological images as listed and agreed with the findings in the report. No results found.    Orders Placed This Encounter  Procedures   CBC with Differential (Cancer Center Only)    Standing Status:   Future    Expected Date:   01/07/2024    Expiration Date:   01/06/2025   CMP (Cancer Center only)    Standing Status:   Future    Expected Date:   01/07/2024    Expiration Date:   01/06/2025   T4    Standing Status:   Future    Expected Date:   01/07/2024    Expiration Date:   01/06/2025   TSH    Standing Status:   Future    Expected Date:   01/07/2024    Expiration Date:   01/06/2025   Total Protein, Urine dipstick    Standing Status:   Future    Expected Date:   01/07/2024    Expiration Date:   01/06/2025   CBC with Differential (Cancer Center Only)    Standing Status:   Future    Expected Date:   01/21/2024    Expiration Date:   01/20/2025   CMP (Cancer Center only)    Standing Status:   Future    Expected Date:   01/21/2024    Expiration Date:   01/20/2025   All questions were answered. The patient knows to call the clinic with any problems, questions or concerns. No barriers to learning was detected. The total time spent in the appointment was 25 minutes, including review of chart and various tests results, discussions about plan of care and coordination of care plan     Onita Mattock, MD 10/29/2023

## 2023-11-08 ENCOUNTER — Encounter: Payer: Self-pay | Admitting: Nurse Practitioner

## 2023-11-11 ENCOUNTER — Other Ambulatory Visit: Payer: Self-pay

## 2023-11-11 NOTE — Assessment & Plan Note (Signed)
  cT3N3Mx, with hypermetabolic left supraclavicular node, and indeterminate adrenal nodule, MMR normal ypT3ypN3a, liver and nodes recurrence in 07/2022 -diagnosed 10/18/21 by EGD for 6 month f/u of gastric ulcers, showed mucosal intramucosal adenocarcinoma. MMR normal. -baseline CEA 11/01/21 significantly elevated at 1,989.58. -EUS on 11/06/21 by Dr. Burnette, staged as T3 N3 (at least stage III) -staging CT CAP 11/08/21 showed: stable gastric antrum wall thickening; increased size of upper abdominal lymph nodes; stable left adrenal nodule, 1.9 cm. -PET scan showed FDG avid lymph nodes within the gastrohepatic ligament, portacaval region, and mesentery. Tracer avid left supraclavicular lymph node and indeterminate left adrenal gland nodule with mild FDG uptake. -he underwent [place] node biopsy on 1/17 which was negative for malignant cells, but no lymphoid tissue seen on biopsy  -repeated PET on 02/07/22 after 3 months neoadjuvant chemo showed resolved left supraclavicular lymph node, and significant improvement in regional lymph nodes and the primary tumor, stable and indeterminate left adrenal nodule.  -CT adrenal protocol showed the left adrenal nodule is likely benign adenoma, no biopsy is needed. -Completed neoadjvant chemo FLOT s/p 12 cycles 11/22/21 - 04/27/2022 -restaging PET on 05/16/22 showed significantly hypermetabolic activity at the primary gastric cancer in pylorus, and a small hypermetabolic mesenteric lymph node, no other evidence of metastasis. surgeon Dr. Stevie aware of PET findings -S/p distal gastrectomy 05/20/22, path showed ypT3N3a with clear margins, 7/18 + LNs, and lymphovascular invasion identified. There was some treatment effect in a few lymph nodes nut not in the primary tumor -due to rising tumor marker, he underwent PET scan on July 07, 2022, which unfortunately showed hypermetabolic new adenopathy in right hilar and mesentery, diffuse liver metastasis, consistent with metastatic  recurrence.  I personally reviewed the PET scan images with patient -Unfortunately his disease is not curable at this stage. -The goal of chemotherapy is palliative, to prolong his life -I discussed his next generation sequencing foundation one result, which showed EGFR amplification, no other targetable mutations.  His tumor was previously tested for HER2 which was negative (0-1+).  PD-L1 CPS score was 2%, no significant benefit of immunotherapy.  -He started paclitaxel  and ramucirumab  on 07/25/2022  He developed worsening neuropathy quickly, and I had to switch his treatment to FOLFIRI on 08/29/2022, every 2 weeks, he is overall tolerating well. -restaging CT 01/19/2023 showed disease progression with new pathologic right paratracheal mediastinal lymph nodes as well as increasing size of several liver metastases. -I changed his chemo to taxol , ramucizumab and Nivo on 02/05/2023, every 2 weeks per his request  -restaging CT 04/08/2023 showed partial response in liver and mediastinal nodes  -restaging PET 07/24/2023 showed mixed response comparing to PET in 07/2022 and CT in 04/2023, with significantly improvement in liver mets and slightly progression in nodes and left adrenal glad metastasis -restaging CT 10/12/2023 showed improved liver mets and stable other node mets  -will continue paclitaxel , ramucirumab  and Nivo every 2 weeks

## 2023-11-12 ENCOUNTER — Encounter: Payer: Self-pay | Admitting: Physician Assistant

## 2023-11-12 ENCOUNTER — Inpatient Hospital Stay: Attending: Physician Assistant | Admitting: Hematology

## 2023-11-12 ENCOUNTER — Encounter: Payer: Self-pay | Admitting: Hematology

## 2023-11-12 ENCOUNTER — Inpatient Hospital Stay

## 2023-11-12 ENCOUNTER — Ambulatory Visit (HOSPITAL_COMMUNITY)

## 2023-11-12 VITALS — BP 127/77 | HR 68 | Temp 98.8°F | Resp 16 | Wt 172.8 lb

## 2023-11-12 DIAGNOSIS — G620 Drug-induced polyneuropathy: Secondary | ICD-10-CM | POA: Diagnosis not present

## 2023-11-12 DIAGNOSIS — C163 Malignant neoplasm of pyloric antrum: Secondary | ICD-10-CM

## 2023-11-12 DIAGNOSIS — E278 Other specified disorders of adrenal gland: Secondary | ICD-10-CM | POA: Diagnosis not present

## 2023-11-12 DIAGNOSIS — D500 Iron deficiency anemia secondary to blood loss (chronic): Secondary | ICD-10-CM | POA: Diagnosis not present

## 2023-11-12 DIAGNOSIS — D6481 Anemia due to antineoplastic chemotherapy: Secondary | ICD-10-CM | POA: Insufficient documentation

## 2023-11-12 DIAGNOSIS — D649 Anemia, unspecified: Secondary | ICD-10-CM

## 2023-11-12 DIAGNOSIS — C787 Secondary malignant neoplasm of liver and intrahepatic bile duct: Secondary | ICD-10-CM | POA: Diagnosis not present

## 2023-11-12 DIAGNOSIS — R609 Edema, unspecified: Secondary | ICD-10-CM

## 2023-11-12 DIAGNOSIS — Z5111 Encounter for antineoplastic chemotherapy: Secondary | ICD-10-CM | POA: Diagnosis present

## 2023-11-12 DIAGNOSIS — T451X5D Adverse effect of antineoplastic and immunosuppressive drugs, subsequent encounter: Secondary | ICD-10-CM | POA: Insufficient documentation

## 2023-11-12 DIAGNOSIS — Z5112 Encounter for antineoplastic immunotherapy: Secondary | ICD-10-CM | POA: Diagnosis present

## 2023-11-12 DIAGNOSIS — Z7952 Long term (current) use of systemic steroids: Secondary | ICD-10-CM | POA: Diagnosis not present

## 2023-11-12 DIAGNOSIS — Z79899 Other long term (current) drug therapy: Secondary | ICD-10-CM | POA: Diagnosis not present

## 2023-11-12 DIAGNOSIS — Z79620 Long term (current) use of immunosuppressive biologic: Secondary | ICD-10-CM | POA: Insufficient documentation

## 2023-11-12 DIAGNOSIS — R600 Localized edema: Secondary | ICD-10-CM | POA: Insufficient documentation

## 2023-11-12 LAB — CMP (CANCER CENTER ONLY)
ALT: 9 U/L (ref 0–44)
AST: 16 U/L (ref 15–41)
Albumin: 3.7 g/dL (ref 3.5–5.0)
Alkaline Phosphatase: 72 U/L (ref 38–126)
Anion gap: 6 (ref 5–15)
BUN: 13 mg/dL (ref 6–20)
CO2: 26 mmol/L (ref 22–32)
Calcium: 8.5 mg/dL — ABNORMAL LOW (ref 8.9–10.3)
Chloride: 107 mmol/L (ref 98–111)
Creatinine: 0.9 mg/dL (ref 0.61–1.24)
GFR, Estimated: >60 mL/min (ref >60)
Glucose, Bld: 98 mg/dL (ref 70–99)
Potassium: 3.8 mmol/L (ref 3.5–5.1)
Sodium: 139 mmol/L (ref 135–145)
Total Bilirubin: 0.5 mg/dL (ref 0.0–1.2)
Total Protein: 6.6 g/dL (ref 6.5–8.1)

## 2023-11-12 LAB — CBC WITH DIFFERENTIAL (CANCER CENTER ONLY)
Abs Immature Granulocytes: 0.03 K/uL (ref 0.00–0.07)
Basophils Absolute: 0 K/uL (ref 0.0–0.1)
Basophils Relative: 0 %
Eosinophils Absolute: 0.1 K/uL (ref 0.0–0.5)
Eosinophils Relative: 1 %
HCT: 25.3 % — ABNORMAL LOW (ref 39.0–52.0)
Hemoglobin: 8 g/dL — ABNORMAL LOW (ref 13.0–17.0)
Immature Granulocytes: 0 %
Lymphocytes Relative: 29 %
Lymphs Abs: 2.1 K/uL (ref 0.7–4.0)
MCH: 27.8 pg (ref 26.0–34.0)
MCHC: 31.6 g/dL (ref 30.0–36.0)
MCV: 87.8 fL (ref 80.0–100.0)
Monocytes Absolute: 0.9 K/uL (ref 0.1–1.0)
Monocytes Relative: 13 %
Neutro Abs: 4 K/uL (ref 1.7–7.7)
Neutrophils Relative %: 57 %
Platelet Count: 157 K/uL (ref 150–400)
RBC: 2.88 MIL/uL — ABNORMAL LOW (ref 4.22–5.81)
RDW: 25.3 % — ABNORMAL HIGH (ref 11.5–15.5)
WBC Count: 7 K/uL (ref 4.0–10.5)
nRBC: 0 % (ref 0.0–0.2)

## 2023-11-12 LAB — FERRITIN: Ferritin: 58 ng/mL (ref 24–336)

## 2023-11-12 LAB — SAMPLE TO BLOOD BANK

## 2023-11-12 LAB — PREPARE RBC (CROSSMATCH)

## 2023-11-12 LAB — TSH: TSH: 4.12 u[IU]/mL (ref 0.350–4.500)

## 2023-11-12 LAB — TOTAL PROTEIN, URINE DIPSTICK: Protein, ur: NEGATIVE mg/dL

## 2023-11-12 LAB — CEA (ACCESS): CEA (CHCC): 555.29 ng/mL — ABNORMAL HIGH (ref 0.00–5.00)

## 2023-11-12 MED ORDER — DIPHENHYDRAMINE HCL 50 MG/ML IJ SOLN
25.0000 mg | Freq: Once | INTRAMUSCULAR | Status: AC
  Administered 2023-11-12: 25 mg via INTRAVENOUS
  Filled 2023-11-12: qty 1

## 2023-11-12 MED ORDER — SODIUM CHLORIDE 0.9 % IV SOLN
8.0000 mg/kg | Freq: Once | INTRAVENOUS | Status: AC
  Administered 2023-11-12: 600 mg via INTRAVENOUS
  Filled 2023-11-12: qty 10

## 2023-11-12 MED ORDER — PALONOSETRON HCL INJECTION 0.25 MG/5ML
0.2500 mg | Freq: Once | INTRAVENOUS | Status: AC
  Administered 2023-11-12: 0.25 mg via INTRAVENOUS
  Filled 2023-11-12: qty 5

## 2023-11-12 MED ORDER — FAMOTIDINE IN NACL 20-0.9 MG/50ML-% IV SOLN
20.0000 mg | Freq: Once | INTRAVENOUS | Status: AC
  Administered 2023-11-12: 20 mg via INTRAVENOUS
  Filled 2023-11-12: qty 50

## 2023-11-12 MED ORDER — SODIUM CHLORIDE 0.9 % IV SOLN
80.0000 mg/m2 | Freq: Once | INTRAVENOUS | Status: AC
  Administered 2023-11-12: 150 mg via INTRAVENOUS
  Filled 2023-11-12: qty 25

## 2023-11-12 MED ORDER — ACETAMINOPHEN 325 MG PO TABS
650.0000 mg | ORAL_TABLET | Freq: Once | ORAL | Status: AC
  Administered 2023-11-12: 650 mg via ORAL
  Filled 2023-11-12: qty 2

## 2023-11-12 MED ORDER — SODIUM CHLORIDE 0.9 % IV SOLN
240.0000 mg | Freq: Once | INTRAVENOUS | Status: AC
  Administered 2023-11-12: 240 mg via INTRAVENOUS
  Filled 2023-11-12: qty 24

## 2023-11-12 MED ORDER — SODIUM CHLORIDE 0.9 % IV SOLN: INTRAVENOUS | Status: DC

## 2023-11-12 MED ORDER — SODIUM CHLORIDE 0.9% IV SOLUTION: 250.0000 mL | INTRAVENOUS | Status: DC

## 2023-11-12 NOTE — Patient Instructions (Addendum)
 [facility] - A DEPT OF [facility]   **You have been scheduled for a venous doppler tomorrow at 12:00 @ [address], [place] [address]. The facility has a free valet and parking deck. If using the parking deck, you will need to enter from the ground floor. If questions concerns or changes, please call [employer] at [phone] **  Discharge Instructions: Thank you for choosing [facility] to provide your oncology and hematology care.   If you have a lab appointment with the Cancer Center, please go directly to the Cancer Center and check in at the registration area.   Wear comfortable clothing and clothing appropriate for easy access to any Portacath or PICC line.   We strive to give you quality time with your provider. You may need to reschedule your appointment if you arrive late (15 or more minutes).  Arriving late affects you and other patients whose appointments are after yours.  Also, if you miss three or more appointments without notifying the office, you may be dismissed from the clinic at the provider's discretion.      For prescription refill requests, have your pharmacy contact our office and allow 72 hours for refills to be completed.    Today you received the following chemotherapy and/or immunotherapy agents: Nivolumab  (Opdivo ), ramucirumab  (Cyramza ), and paclitaxel  (Taxol        To help prevent nausea and vomiting after your treatment, we encourage you to take your nausea medication as directed.  BELOW ARE SYMPTOMS THAT SHOULD BE REPORTED IMMEDIATELY: *FEVER GREATER THAN 100.4 F (38 C) OR HIGHER *CHILLS OR SWEATING *NAUSEA AND VOMITING THAT IS NOT CONTROLLED WITH YOUR NAUSEA MEDICATION *UNUSUAL SHORTNESS OF BREATH *UNUSUAL BRUISING OR BLEEDING *URINARY PROBLEMS (pain or burning when urinating, or frequent urination) *BOWEL PROBLEMS (unusual diarrhea, constipation, pain near the anus) TENDERNESS IN MOUTH AND  THROAT WITH OR WITHOUT PRESENCE OF ULCERS (sore throat, sores in mouth, or a toothache) UNUSUAL RASH, SWELLING OR PAIN  UNUSUAL VAGINAL DISCHARGE OR ITCHING   Items with * indicate a potential emergency and should be followed up as soon as possible or go to the Emergency Department if any problems should occur.  Please show the CHEMOTHERAPY ALERT CARD or IMMUNOTHERAPY ALERT CARD at check-in to the Emergency Department and triage nurse.  Should you have questions after your visit or need to cancel or reschedule your appointment, please contact [facility] - A DEPT OF [facility]  Dept: [phone]  and follow the prompts.  Office hours are 8:00 a.m. to 4:30 p.m. Monday - Friday. Please note that voicemails left after 4:00 p.m. may not be returned until the following business day.  We are closed weekends and major holidays. You have access to a nurse at all times for urgent questions. Please call the main number to the clinic Dept: [phone] and follow the prompts.   For any non-urgent questions, you may also contact your provider using MyChart. We now offer e-Visits for anyone 24 and older to request care online for non-urgent symptoms. For details visit [email or web address].   Also download the MyChart app! Go to the app store, search MyChart, open the app, select [facility], and log in with your MyChart username and password.

## 2023-11-12 NOTE — Progress Notes (Signed)
 Verbal order w/readback for LLE Doppler to r/o DVT to be done w/in 24hrs.  Order placed and pt will have doppler done on 11/13/2023 @12pm .  Verbal order w/readback for 1 unit of PRBCs no premeds needed to be given w/in the next 24 to 48 hrs per Dr. Lanny.  Orders placed and pt received 1 unit of PRBCs today while in infusion for chemo.

## 2023-11-12 NOTE — Progress Notes (Signed)
 [facility]   [phone] [phone]   Clinic Follow up Note   Patient Care Team: Pcp, No as PCP - [name], MD as Consulting Physician (Hematology and Oncology)  Date of Service:  11/12/2023  CHIEF COMPLAINT: f/u of gastric cancer  CURRENT THERAPY:  Paclitaxel , nivo and ramucirumab  every 2 weeks  Oncology History   Gastric cancer (HCC)  cT3N3Mx, with hypermetabolic left supraclavicular node, and indeterminate adrenal nodule, MMR normal ypT3ypN3a, liver and nodes recurrence in 07/2022 -diagnosed 10/18/21 by EGD for 6 month f/u of gastric ulcers, showed mucosal intramucosal adenocarcinoma. MMR normal. -baseline CEA 11/01/21 significantly elevated at 1,989.58. -EUS on 11/06/21 by Dr. Burnette, staged as T3 N3 (at least stage III) -staging CT CAP 11/08/21 showed: stable gastric antrum wall thickening; increased size of upper abdominal lymph nodes; stable left adrenal nodule, 1.9 cm. -PET scan showed FDG avid lymph nodes within the gastrohepatic ligament, portacaval region, and mesentery. Tracer avid left supraclavicular lymph node and indeterminate left adrenal gland nodule with mild FDG uptake. -he underwent [place] node biopsy on 1/17 which was negative for malignant cells, but no lymphoid tissue seen on biopsy  -repeated PET on 02/07/22 after 3 months neoadjuvant chemo showed resolved left supraclavicular lymph node, and significant improvement in regional lymph nodes and the primary tumor, stable and indeterminate left adrenal nodule.  -CT adrenal protocol showed the left adrenal nodule is likely benign adenoma, no biopsy is needed. -Completed neoadjvant chemo FLOT s/p 12 cycles 11/22/21 - 04/27/2022 -restaging PET on 05/16/22 showed significantly hypermetabolic activity at the primary gastric cancer in pylorus, and a small hypermetabolic mesenteric lymph node, no other evidence of metastasis. surgeon Dr. Stevie aware of PET findings -S/p distal  gastrectomy 05/20/22, path showed ypT3N3a with clear margins, 7/18 + LNs, and lymphovascular invasion identified. There was some treatment effect in a few lymph nodes nut not in the primary tumor -due to rising tumor marker, he underwent PET scan on July 07, 2022, which unfortunately showed hypermetabolic new adenopathy in right hilar and mesentery, diffuse liver metastasis, consistent with metastatic recurrence.  I personally reviewed the PET scan images with patient -Unfortunately his disease is not curable at this stage. -The goal of chemotherapy is palliative, to prolong his life -I discussed his next generation sequencing foundation one result, which showed EGFR amplification, no other targetable mutations.  His tumor was previously tested for HER2 which was negative (0-1+).  PD-L1 CPS score was 2%, no significant benefit of immunotherapy.  -He started paclitaxel  and ramucirumab  on 07/25/2022  He developed worsening neuropathy quickly, and I had to switch his treatment to FOLFIRI on 08/29/2022, every 2 weeks, he is overall tolerating well. -restaging CT 01/19/2023 showed disease progression with new pathologic right paratracheal mediastinal lymph nodes as well as increasing size of several liver metastases. -I changed his chemo to taxol , ramucizumab and Nivo on 02/05/2023, every 2 weeks per his request  -restaging CT 04/08/2023 showed partial response in liver and mediastinal nodes  -restaging PET 07/24/2023 showed mixed response comparing to PET in 07/2022 and CT in 04/2023, with significantly improvement in liver mets and slightly progression in nodes and left adrenal glad metastasis -restaging CT 10/12/2023 showed improved liver mets and stable other node mets  -will continue paclitaxel , ramucirumab  and Nivo every 2 weeks  Assessment & Plan Gastric cancer on chemotherapy Gastric cancer under treatment with paclitaxel  and ramucirumab  every two weeks. Clinically well with good appetite and no new  symptoms. - Proceed with chemotherapy  if lab results are adequate - Will return in two weeks for continuation of chemotherapy  Peripheral neuropathy secondary to chemotherapy Peripheral neuropathy is well-managed with gabapentin . No new pain or symptoms reported.  Bilateral ankle edema, left greater than right, with possible deep vein thrombosis New onset of left ankle edema with intermittent left calf pain. Differential diagnosis includes deep vein thrombosis (DVT). - Ordered Doppler ultrasound of lower extremity to rule out DVT  Plan - He is clinically stable, lab reviewed, will proceed to chemotherapy today and continue every 2 weeks - Doppler of left lower extremity to rule out DVT in the next few days - Follow-up in 2 weeks   SUMMARY OF ONCOLOGIC HISTORY: Oncology History Overview Note   Cancer Staging  Gastric cancer [facility]) Staging form: Stomach, AJCC 8th Edition - Clinical stage from 10/18/2021: Stage III (cT3, cN3a, cM0) - Signed by [name], MD on 11/10/2021     Gastric cancer (HCC)  10/18/2021 Procedure   EGD performed under the care of Dr. Dianna  Findings:  -A large, ulcerated, partially circumferential (involving two thirds of the lumen circumference) mass with no bleeding and stigmata of recent bleeding was found in the gastric antrum, at the pylorus and in the prepyloric region of the stomach. -Segmental severe inflammation characterized by congestion (edema) and erythema was found in the gastric antrum.   10/18/2021 Cancer Staging   Staging form: Stomach, AJCC 8th Edition - Clinical stage from 10/18/2021: Stage III (cT3, cN3a, cM0) - Signed by [name], MD on 11/10/2021 Stage prefix: Initial diagnosis Histologic grade (G): GX Histologic grading system: 3 grade system   10/29/2021 Pathology Results   Stomach, antrum, pylorus, biopsy: -AT LEAST MUCOSA INTRAMUCOSAL ADENOCARCINOMA ARISING WITHIN CHRONIC INACTIVE GASTRITIS WITH INTESTINAL METAPLASIA (INCOMPLETE  TYPE). Negative for dysplasia.  Negative for helicobacter pylori  Mismatch Repair (MMR) Protein Imunohistochemistry (IHC): IHC Expression Result: MLH1: Preserved nuclear expression. MSH2: Preserved nuclear expression. MSH6: Preserved nuclear expression. PMS2: Preserved nuclear expression. Interpretation: NORMAL   10/31/2021 Initial Diagnosis   Gastric cancer (HCC)   11/01/2021 Tumor Marker   CEA = 1,989.58 (^)   11/06/2021 Procedure   Upper EUS, Dr. Burnette  Impression: - Normal esophagus. - A small amount of food (residue) in the stomach. - Congested, friable (with contact bleeding) and ulcerated mucosa in the prepyloric region of the stomach. - Normal examined duodenum. - There was no evidence of significant pathology in the left lobe of the liver. - Many abnormal lymph nodes (over 10) were visualized in the gastrohepatic ligament (level 18), celiac region (level 20), perigastric region, peripancreatic region and aortocaval region. - Wall thickening was seen in the prepyloric region of the stomach. The thickening appeared to be primarily within the deep mucosa (Layer 2) but extended through the muscularis propria. - Overall constellation of findings consistent with at least T3 N3 Mx (at least stage III) gastric adenocarcinoma.   11/08/2021 Imaging   EXAM: CT CHEST, ABDOMEN, AND PELVIS WITH CONTRAST  IMPRESSION: 1. Similar irregular wall thickening of the gastric antrum, consistent with patient's known primary gastric neoplasm. 2. Increased size of the upper abdominal lymph nodes, concerning for worsening nodal disease involvement. 3. Stable left adrenal nodule common nonspecific possibly a metastatic lesion or adenoma. 4. No evidence of metastatic disease in the chest. 5. Left-sided colonic diverticulosis without findings of acute diverticulitis. 6. Enlarged prostate gland. 7.  Aortic Atherosclerosis (ICD10-I70.0).   11/22/2021 - 04/26/2022 Chemotherapy   Patient is on  Treatment Plan : GASTROESOPHAGEAL FLOT q14d  X 4 cycles     11/27/2021 Imaging    IMPRESSION: 1. The mass within the gastric antrum is mildly decreased in size when compared with 11/08/2021. There is persistent FDG uptake associated with this mass compatible with residual tumor. 2. Persistent FDG avid lymph nodes within the gastrohepatic ligament, portacaval region, and mesentery. These are mildly decreased in size when compared with 11/08/2021. 3. Tracer avid left supraclavicular lymph node is also mildly decreased in size when compared with 11/08/2021. 4. Indeterminate left adrenal gland nodule with mild FDG uptake. This may represent a lipid poor adenoma. Metastatic disease not exclude. 5. Diffuse increased uptake throughout the bone marrow is favored to represent treatment related change. 6.  Aortic Atherosclerosis (ICD10-I70.0).     02/07/2022 Imaging    IMPRESSION: 1. No substantial change in hypermetabolism associated with the distal gastric lesion. Although difficult to discern on noncontrast CT imaging, the soft tissue component appears to be decreased since the prior PET-CT. 2. Interval resolution of the hypermetabolic left supraclavicular lymph node. 3. Interval marked decrease of the hypermetabolic gastrohepatic ligament lymphadenopathy. Similar decrease in size and hypermetabolism associated with index nodes identified previously in the 4. porta hepatis and hypogastric region. 5. Stable left adrenal nodule with slight hypermetabolism. Nonspecific finding could represent lipid poor adenoma or metastatic deposit. 6. No new suspicious hypermetabolic disease on today's study. 7.  Aortic Atherosclerosis (ICD10-I70.0).   07/25/2022 - 08/15/2022 Chemotherapy   Patient is on Treatment Plan : GASTROESOPHAGEAL Ramucirumab  D1, 15 + Paclitaxel  D1,8,15 q28d     08/29/2022 - 01/24/2023 Chemotherapy   Patient is on Treatment Plan : COLORECTAL FOLFIRI q14d     02/05/2023 -   Chemotherapy   Patient is on Treatment Plan : GASTROESOPHAGEAL Ramucirumab  D1, 15 + Paclitaxel  D1,8,15 q28d        Discussed the use of AI scribe software for clinical note transcription with the patient, who gave verbal consent to proceed.  History of Present Illness Poseidon Pam is a 53 year old male with esophageal cancer who presents for follow-up.  He is undergoing chemotherapy with paclitaxel  and ramucirumab  every two weeks. He maintains a good appetite. Neuropathy is stable and managed with gabapentin . New left ankle edema is more pronounced on the left side, with intermittent left calf pain. He remains functional without fever or chills.     All other systems were reviewed with the patient and are negative.  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    SURGICAL HISTORY: Past Surgical History:  Procedure Laterality Date   BIOPSY  04/12/2021   Procedure: BIOPSY;  Surgeon: Dianna Specking, MD;  Location: [facility];  Service: Gastroenterology;;   ESOPHAGOGASTRODUODENOSCOPY N/A 04/12/2021   Procedure: ESOPHAGOGASTRODUODENOSCOPY (EGD);  Surgeon: Dianna Specking, MD;  Location: [facility];  Service: Gastroenterology;  Laterality: N/A;   ESOPHAGOGASTRODUODENOSCOPY N/A 11/06/2021   Procedure: ESOPHAGOGASTRODUODENOSCOPY (EGD);  Surgeon: Burnette Fallow, MD;  Location: [facility];  Service: Gastroenterology;  Laterality: N/A;   GASTRECTOMY N/A 05/20/2022   Procedure: OPEN PARTIAL GASTRECTOMY WITH GASTROJEJUNAL ANASTOMOSIS;  Surgeon: Stevie, Herlene Righter, MD;  Location: [facility];  Service: General;  Laterality: N/A;   HERNIA REPAIR Left    inguinal  IR IMAGING GUIDED PORT INSERTION   11/15/2021   LAPAROSCOPY N/A 05/20/2022   Procedure: LAPAROSCOPY DIAGNOSTIC;  Surgeon: Stevie Herlene Righter, MD;  Location: [facility];  Service: General;  Laterality: N/A;   UPPER ESOPHAGEAL ENDOSCOPIC ULTRASOUND (EUS) Bilateral 11/06/2021   Procedure: UPPER ESOPHAGEAL ENDOSCOPIC ULTRASOUND (EUS);  Surgeon: Burnette Fallow, MD;  Location: [facility];  Service: Gastroenterology;  Laterality: Bilateral;    I have reviewed the social history and family history with the patient and they are unchanged from previous note.  ALLERGIES:  has no known allergies.  MEDICATIONS:  Current Outpatient Medications  Medication Sig Dispense Refill   acetaminophen  (TYLENOL ) 500 MG tablet Take 1 tablet (500 mg total) by mouth 2 (two) times daily. 60 tablet 3   dexamethasone  (DECADRON ) 4 MG tablet Take 1 tablet (4 mg total) by mouth daily. Start the day after chemotherapy for 1-3 days.  Take with food. 30 tablet 1   diclofenac  Sodium (VOLTAREN  ARTHRITIS PAIN) 1 % GEL Apply 4 g topically 4 (four) times daily. 100 g 3   gabapentin  (NEURONTIN ) 600 MG tablet Take 2 tablets (1,200 mg total) by mouth 3 (three) times daily. 180 tablet 1   magic mouthwash (nystatin , lidocaine , diphenhydrAMINE , alum & mag hydroxide) suspension Swish for 5 seconds and swallow 5 mLs by mouth 3 (three) times daily as needed for mouth pain. 140 mL 0   ondansetron  (ZOFRAN -ODT) 4 MG disintegrating tablet Dissolve 1 tablet (4 mg total) by mouth every 6 (six) hours as needed for nausea or vomiting. 30 tablet 1   pantoprazole  (PROTONIX ) 40 MG tablet Take 1 tablet (40 mg) by mouth 2 times daily. 60 tablet 6   prochlorperazine  (COMPAZINE ) 10 MG tablet Take 1 tablet (10 mg total) by mouth every 6 (six) hours as needed for nausea or vomiting. 30 tablet 2   No current facility-administered medications for this visit.   Facility-Administered Medications Ordered in Other Visits  Medication Dose Route Frequency Provider Last Rate Last Admin   0.9 %   sodium chloride  infusion (Manually program via Guardrails IV Fluids)  250 mL Intravenous Continuous [name], MD       0.9 %  sodium chloride  infusion   Intravenous Continuous [name], MD 10 mL/hr at 11/12/23 0927 New Bag at 11/12/23 0927    PHYSICAL EXAMINATION: ECOG PERFORMANCE STATUS: 1 - Symptomatic but completely ambulatory  There were no vitals filed for this visit. Wt Readings from Last 3 Encounters:  11/12/23 172 lb 12 oz (78.4 kg)  10/29/23 173 lb 6.4 oz (78.7 kg)  10/15/23 165 lb 8 oz (75.1 kg)     GENERAL:alert, no distress and comfortable SKIN: skin color, texture, turgor are normal, no rashes or significant lesions EYES: normal, Conjunctiva are pink and non-injected, sclera clear NECK: supple, thyroid  normal size, non-tender, without nodularity LYMPH:  no palpable lymphadenopathy in the cervical, axillary  LUNGS: clear to auscultation and percussion with normal breathing effort HEART: regular rate & rhythm and no murmurs and no lower extremity edema ABDOMEN:abdomen soft, non-tender and normal bowel sounds Musculoskeletal:no cyanosis of digits and no clubbing  NEURO: alert & oriented x 3 with fluent speech, no focal motor/sensory deficits  Physical Exam EXTREMITIES: Mild bilateral ankle edema, more pronounced on the left.  LABORATORY DATA:  I have reviewed the data as listed    Latest Ref Rng & Units 11/12/2023    7:53 AM 10/29/2023    7:30 AM 10/15/2023    9:05 AM  CBC  WBC 4.0 -  10.5 K/uL 7.0  6.2  5.6   Hemoglobin 13.0 - 17.0 g/dL 8.0  8.5  8.9   Hematocrit 39.0 - 52.0 % 25.3  27.4  28.3   Platelets 150 - 400 K/uL 157  136  154         Latest Ref Rng & Units 11/12/2023    7:53 AM 10/29/2023    7:30 AM 10/15/2023    9:05 AM  CMP  Glucose 70 - 99 mg/dL 98  98  84   BUN 6 - 20 mg/dL 13  13  12    Creatinine 0.61 - 1.24 mg/dL 9.09  9.19  9.11   Sodium 135 - 145 mmol/L 139  142  141   Potassium 3.5 - 5.1 mmol/L 3.8  4.3  4.5   Chloride 98 - 111 mmol/L 107   110  109   CO2 22 - 32 mmol/L 26  28  28    Calcium  8.9 - 10.3 mg/dL 8.5  9.1  9.2   Total Protein 6.5 - 8.1 g/dL 6.6  6.7  7.2   Total Bilirubin 0.0 - 1.2 mg/dL 0.5  0.6  0.5   Alkaline Phos 38 - 126 U/L 72  79  78   AST 15 - 41 U/L 16  23  24    ALT 0 - 44 U/L 9  12  11        RADIOGRAPHIC STUDIES: I have personally reviewed the radiological images as listed and agreed with the findings in the report. No results found.    Orders Placed This Encounter  Procedures   Care order/instruction    Transfuse Parameters    Standing Status:   Future    Expiration Date:   11/11/2024   Type and screen         Standing Status:   Future    Number of Occurrences:   1    Expected Date:   11/12/2023    Expiration Date:   11/11/2024   Prepare RBC (crossmatch)    Standing Status:   Standing    Number of Occurrences:   1    # of Units:   1 unit    Transfusion Indications:   Other    Comments:   symptomatic anemia    Number of Units to Keep Ahead:   NO units ahead    Instructions::   Transfuse    If emergent release call blood bank:   [facility] [phone]   All questions were answered. The patient knows to call the clinic with any problems, questions or concerns. No barriers to learning was detected. The total time spent in the appointment was 25 minutes, including review of chart and various tests results, discussions about plan of care and coordination of care plan     Onita Mattock, MD 11/12/2023

## 2023-11-12 NOTE — Progress Notes (Signed)
 Verbal order w/readback from Dr. Lanny for doppler for LLE

## 2023-11-13 ENCOUNTER — Ambulatory Visit (HOSPITAL_COMMUNITY)
Admission: RE | Admit: 2023-11-13 | Discharge: 2023-11-13 | Disposition: A | Discharge status: Home / Self Care | Source: Ambulatory Visit | Attending: Hematology | Admitting: Hematology

## 2023-11-13 DIAGNOSIS — R609 Edema, unspecified: Secondary | ICD-10-CM | POA: Diagnosis present

## 2023-11-13 DIAGNOSIS — C163 Malignant neoplasm of pyloric antrum: Secondary | ICD-10-CM | POA: Diagnosis present

## 2023-11-13 LAB — T4: T4, Total: 6.8 ug/dL (ref 4.5–12.0)

## 2023-11-13 LAB — BPAM RBC
Blood Product Expiration Date: 202512062359
ISSUE DATE / TIME: 202511061254
Unit Type and Rh: 5100

## 2023-11-13 LAB — TYPE AND SCREEN
ABO/RH(D): O POS
Antibody Screen: NEGATIVE
Unit division: 0

## 2023-11-22 ENCOUNTER — Other Ambulatory Visit (HOSPITAL_COMMUNITY): Payer: Self-pay

## 2023-11-25 ENCOUNTER — Telehealth: Payer: Self-pay

## 2023-11-25 ENCOUNTER — Other Ambulatory Visit: Payer: Self-pay

## 2023-11-25 ENCOUNTER — Other Ambulatory Visit: Payer: Self-pay | Admitting: Hematology

## 2023-11-25 MED ORDER — GABAPENTIN 600 MG PO TABS: 1200.0000 mg | ORAL_TABLET | Freq: Three times a day (TID) | ORAL | 3 refills | Status: DC

## 2023-11-25 NOTE — Telephone Encounter (Signed)
 Pt called requesting a refill for gabapentin .  Notified Dr. Lanny and Team for refill.

## 2023-11-25 NOTE — Assessment & Plan Note (Signed)
  cT3N3Mx, with hypermetabolic left supraclavicular node, and indeterminate adrenal nodule, MMR normal ypT3ypN3a, liver and nodes recurrence in 07/2022 -diagnosed 10/18/21 by EGD for 6 month f/u of gastric ulcers, showed mucosal intramucosal adenocarcinoma. MMR normal. -baseline CEA 11/01/21 significantly elevated at 1,989.58. -EUS on 11/06/21 by Dr. Burnette, staged as T3 N3 (at least stage III) -staging CT CAP 11/08/21 showed: stable gastric antrum wall thickening; increased size of upper abdominal lymph nodes; stable left adrenal nodule, 1.9 cm. -PET scan showed FDG avid lymph nodes within the gastrohepatic ligament, portacaval region, and mesentery. Tracer avid left supraclavicular lymph node and indeterminate left adrenal gland nodule with mild FDG uptake. -he underwent [place] node biopsy on 1/17 which was negative for malignant cells, but no lymphoid tissue seen on biopsy  -repeated PET on 02/07/22 after 3 months neoadjuvant chemo showed resolved left supraclavicular lymph node, and significant improvement in regional lymph nodes and the primary tumor, stable and indeterminate left adrenal nodule.  -CT adrenal protocol showed the left adrenal nodule is likely benign adenoma, no biopsy is needed. -Completed neoadjvant chemo FLOT s/p 12 cycles 11/22/21 - 04/27/2022 -restaging PET on 05/16/22 showed significantly hypermetabolic activity at the primary gastric cancer in pylorus, and a small hypermetabolic mesenteric lymph node, no other evidence of metastasis. surgeon Dr. Stevie aware of PET findings -S/p distal gastrectomy 05/20/22, path showed ypT3N3a with clear margins, 7/18 + LNs, and lymphovascular invasion identified. There was some treatment effect in a few lymph nodes nut not in the primary tumor -due to rising tumor marker, he underwent PET scan on July 07, 2022, which unfortunately showed hypermetabolic new adenopathy in right hilar and mesentery, diffuse liver metastasis, consistent with metastatic  recurrence.  I personally reviewed the PET scan images with patient -Unfortunately his disease is not curable at this stage. -The goal of chemotherapy is palliative, to prolong his life -I discussed his next generation sequencing foundation one result, which showed EGFR amplification, no other targetable mutations.  His tumor was previously tested for HER2 which was negative (0-1+).  PD-L1 CPS score was 2%, no significant benefit of immunotherapy.  -He started paclitaxel  and ramucirumab  on 07/25/2022  He developed worsening neuropathy quickly, and I had to switch his treatment to FOLFIRI on 08/29/2022, every 2 weeks, he is overall tolerating well. -restaging CT 01/19/2023 showed disease progression with new pathologic right paratracheal mediastinal lymph nodes as well as increasing size of several liver metastases. -I changed his chemo to taxol , ramucizumab and Nivo on 02/05/2023, every 2 weeks per his request  -restaging CT 04/08/2023 showed partial response in liver and mediastinal nodes  -restaging PET 07/24/2023 showed mixed response comparing to PET in 07/2022 and CT in 04/2023, with significantly improvement in liver mets and slightly progression in nodes and left adrenal glad metastasis -restaging CT 10/12/2023 showed improved liver mets and stable other node mets  -will continue paclitaxel , ramucirumab  and Nivo every 2 weeks

## 2023-11-26 ENCOUNTER — Inpatient Hospital Stay

## 2023-11-26 ENCOUNTER — Inpatient Hospital Stay (HOSPITAL_BASED_OUTPATIENT_CLINIC_OR_DEPARTMENT_OTHER): Admitting: Hematology

## 2023-11-26 VITALS — BP 130/70 | HR 100 | Temp 99.1°F | Resp 17 | Wt 180.6 lb

## 2023-11-26 DIAGNOSIS — D500 Iron deficiency anemia secondary to blood loss (chronic): Secondary | ICD-10-CM

## 2023-11-26 DIAGNOSIS — D649 Anemia, unspecified: Secondary | ICD-10-CM

## 2023-11-26 DIAGNOSIS — C163 Malignant neoplasm of pyloric antrum: Secondary | ICD-10-CM

## 2023-11-26 DIAGNOSIS — Z5112 Encounter for antineoplastic immunotherapy: Secondary | ICD-10-CM | POA: Diagnosis not present

## 2023-11-26 LAB — CMP (CANCER CENTER ONLY)
ALT: 12 U/L (ref 0–44)
AST: 23 U/L (ref 15–41)
Albumin: 3.9 g/dL (ref 3.5–5.0)
Alkaline Phosphatase: 72 U/L (ref 38–126)
Anion gap: 8 (ref 5–15)
BUN: 14 mg/dL (ref 6–20)
CO2: 27 mmol/L (ref 22–32)
Calcium: 9 mg/dL (ref 8.9–10.3)
Chloride: 104 mmol/L (ref 98–111)
Creatinine: 0.84 mg/dL (ref 0.61–1.24)
GFR, Estimated: >60 mL/min (ref >60)
Glucose, Bld: 102 mg/dL — ABNORMAL HIGH (ref 70–99)
Potassium: 4.7 mmol/L (ref 3.5–5.1)
Sodium: 139 mmol/L (ref 135–145)
Total Bilirubin: 0.5 mg/dL (ref 0.0–1.2)
Total Protein: 6.5 g/dL (ref 6.5–8.1)

## 2023-11-26 LAB — CBC WITH DIFFERENTIAL (CANCER CENTER ONLY)
Abs Immature Granulocytes: 0.08 K/uL — ABNORMAL HIGH (ref 0.00–0.07)
Basophils Absolute: 0 K/uL (ref 0.0–0.1)
Basophils Relative: 1 %
Eosinophils Absolute: 0.1 K/uL (ref 0.0–0.5)
Eosinophils Relative: 1 %
HCT: 28.2 % — ABNORMAL LOW (ref 39.0–52.0)
Hemoglobin: 8.8 g/dL — ABNORMAL LOW (ref 13.0–17.0)
Immature Granulocytes: 1 %
Lymphocytes Relative: 28 %
Lymphs Abs: 2.1 K/uL (ref 0.7–4.0)
MCH: 28.2 pg (ref 26.0–34.0)
MCHC: 31.2 g/dL (ref 30.0–36.0)
MCV: 90.4 fL (ref 80.0–100.0)
Monocytes Absolute: 1.2 K/uL — ABNORMAL HIGH (ref 0.1–1.0)
Monocytes Relative: 16 %
Neutro Abs: 4.1 K/uL (ref 1.7–7.7)
Neutrophils Relative %: 53 %
Platelet Count: 145 K/uL — ABNORMAL LOW (ref 150–400)
RBC: 3.12 MIL/uL — ABNORMAL LOW (ref 4.22–5.81)
RDW: 25.2 % — ABNORMAL HIGH (ref 11.5–15.5)
WBC Count: 7.6 K/uL (ref 4.0–10.5)
nRBC: 0.4 % — ABNORMAL HIGH (ref 0.0–0.2)

## 2023-11-26 LAB — SAMPLE TO BLOOD BANK

## 2023-11-26 MED ORDER — DIPHENHYDRAMINE HCL 50 MG/ML IJ SOLN
25.0000 mg | Freq: Once | INTRAMUSCULAR | Status: AC
  Administered 2023-11-26: 25 mg via INTRAVENOUS
  Filled 2023-11-26: qty 1

## 2023-11-26 MED ORDER — SODIUM CHLORIDE 0.9 % IV SOLN
240.0000 mg | Freq: Once | INTRAVENOUS | Status: AC
  Administered 2023-11-26: 240 mg via INTRAVENOUS
  Filled 2023-11-26: qty 24

## 2023-11-26 MED ORDER — PALONOSETRON HCL INJECTION 0.25 MG/5ML
0.2500 mg | Freq: Once | INTRAVENOUS | Status: AC
  Administered 2023-11-26: 0.25 mg via INTRAVENOUS
  Filled 2023-11-26: qty 5

## 2023-11-26 MED ORDER — FAMOTIDINE IN NACL 20-0.9 MG/50ML-% IV SOLN
20.0000 mg | Freq: Once | INTRAVENOUS | Status: AC
  Administered 2023-11-26: 20 mg via INTRAVENOUS
  Filled 2023-11-26: qty 50

## 2023-11-26 MED ORDER — SODIUM CHLORIDE 0.9 % IV SOLN
8.0000 mg/kg | Freq: Once | INTRAVENOUS | Status: AC
  Administered 2023-11-26: 600 mg via INTRAVENOUS
  Filled 2023-11-26: qty 10

## 2023-11-26 MED ORDER — SODIUM CHLORIDE 0.9 % IV SOLN: INTRAVENOUS | Status: DC

## 2023-11-26 MED ORDER — ACETAMINOPHEN 325 MG PO TABS
650.0000 mg | ORAL_TABLET | Freq: Once | ORAL | Status: AC
  Administered 2023-11-26: 650 mg via ORAL
  Filled 2023-11-26: qty 2

## 2023-11-26 MED ORDER — SODIUM CHLORIDE 0.9% FLUSH: 10.0000 mL | INTRAVENOUS | Status: DC | PRN

## 2023-11-26 MED ORDER — SODIUM CHLORIDE 0.9 % IV SOLN
80.0000 mg/m2 | Freq: Once | INTRAVENOUS | Status: AC
  Administered 2023-11-26: 150 mg via INTRAVENOUS
  Filled 2023-11-26: qty 25

## 2023-11-26 NOTE — Patient Instructions (Signed)
 [facility] - A DEPT OF [facility]  Discharge Instructions: Thank you for choosing [facility] to provide your oncology and hematology care.   If you have a lab appointment with the Cancer Center, please go directly to the Cancer Center and check in at the registration area.   Wear comfortable clothing and clothing appropriate for easy access to any Portacath or PICC line.   We strive to give you quality time with your provider. You may need to reschedule your appointment if you arrive late (15 or more minutes).  Arriving late affects you and other patients whose appointments are after yours.  Also, if you miss three or more appointments without notifying the office, you may be dismissed from the clinic at the provider's discretion.      For prescription refill requests, have your pharmacy contact our office and allow 72 hours for refills to be completed.    Today you received the following chemotherapy and/or immunotherapy agents opdivo , cyramza , taxol       To help prevent nausea and vomiting after your treatment, we encourage you to take your nausea medication as directed.  BELOW ARE SYMPTOMS THAT SHOULD BE REPORTED IMMEDIATELY: *FEVER GREATER THAN 100.4 F (38 C) OR HIGHER *CHILLS OR SWEATING *NAUSEA AND VOMITING THAT IS NOT CONTROLLED WITH YOUR NAUSEA MEDICATION *UNUSUAL SHORTNESS OF BREATH *UNUSUAL BRUISING OR BLEEDING *URINARY PROBLEMS (pain or burning when urinating, or frequent urination) *BOWEL PROBLEMS (unusual diarrhea, constipation, pain near the anus) TENDERNESS IN MOUTH AND THROAT WITH OR WITHOUT PRESENCE OF ULCERS (sore throat, sores in mouth, or a toothache) UNUSUAL RASH, SWELLING OR PAIN  UNUSUAL VAGINAL DISCHARGE OR ITCHING   Items with * indicate a potential emergency and should be followed up as soon as possible or go to the Emergency Department if any problems should occur.  Please show the CHEMOTHERAPY ALERT CARD or  IMMUNOTHERAPY ALERT CARD at check-in to the Emergency Department and triage nurse.  Should you have questions after your visit or need to cancel or reschedule your appointment, please contact [facility] - A DEPT OF [facility]  Dept: [phone]  and follow the prompts.  Office hours are 8:00 a.m. to 4:30 p.m. Monday - Friday. Please note that voicemails left after 4:00 p.m. may not be returned until the following business day.  We are closed weekends and major holidays. You have access to a nurse at all times for urgent questions. Please call the main number to the clinic Dept: [phone] and follow the prompts.   For any non-urgent questions, you may also contact your provider using MyChart. We now offer e-Visits for anyone 56 and older to request care online for non-urgent symptoms. For details visit [email or web address].   Also download the MyChart app! Go to the app store, search MyChart, open the app, select [facility], and log in with your MyChart username and password.

## 2023-11-26 NOTE — Progress Notes (Signed)
 [facility]   [phone] [phone]   Clinic Follow up Note   Patient Care Team: Pcp, No as PCP - General [name], MD as Consulting Physician (Hematology and Oncology)  Date of Service:  11/26/2023  CHIEF COMPLAINT: f/u of gastric cancer  CURRENT THERAPY:  Second line chemotherapy paclitaxel , ramucirumab  and nivolumab  every 2 weeks  Oncology History   Gastric cancer (HCC)  cT3N3Mx, with hypermetabolic left supraclavicular node, and indeterminate adrenal nodule, MMR normal ypT3ypN3a, liver and nodes recurrence in 07/2022 -diagnosed 10/18/21 by EGD for 6 month f/u of gastric ulcers, showed mucosal intramucosal adenocarcinoma. MMR normal. -baseline CEA 11/01/21 significantly elevated at 1,989.58. -EUS on 11/06/21 by Dr. Burnette, staged as T3 N3 (at least stage III) -staging CT CAP 11/08/21 showed: stable gastric antrum wall thickening; increased size of upper abdominal lymph nodes; stable left adrenal nodule, 1.9 cm. -PET scan showed FDG avid lymph nodes within the gastrohepatic ligament, portacaval region, and mesentery. Tracer avid left supraclavicular lymph node and indeterminate left adrenal gland nodule with mild FDG uptake. -he underwent [place] node biopsy on 1/17 which was negative for malignant cells, but no lymphoid tissue seen on biopsy  -repeated PET on 02/07/22 after 3 months neoadjuvant chemo showed resolved left supraclavicular lymph node, and significant improvement in regional lymph nodes and the primary tumor, stable and indeterminate left adrenal nodule.  -CT adrenal protocol showed the left adrenal nodule is likely benign adenoma, no biopsy is needed. -Completed neoadjvant chemo FLOT s/p 12 cycles 11/22/21 - 04/27/2022 -restaging PET on 05/16/22 showed significantly hypermetabolic activity at the primary gastric cancer in pylorus, and a small hypermetabolic mesenteric lymph node, no other evidence of metastasis. surgeon Dr. Stevie aware of  PET findings -S/p distal gastrectomy 05/20/22, path showed ypT3N3a with clear margins, 7/18 + LNs, and lymphovascular invasion identified. There was some treatment effect in a few lymph nodes nut not in the primary tumor -due to rising tumor marker, he underwent PET scan on July 07, 2022, which unfortunately showed hypermetabolic new adenopathy in right hilar and mesentery, diffuse liver metastasis, consistent with metastatic recurrence.  I personally reviewed the PET scan images with patient -Unfortunately his disease is not curable at this stage. -The goal of chemotherapy is palliative, to prolong his life -I discussed his next generation sequencing foundation one result, which showed EGFR amplification, no other targetable mutations.  His tumor was previously tested for HER2 which was negative (0-1+).  PD-L1 CPS score was 2%, no significant benefit of immunotherapy.  -He started paclitaxel  and ramucirumab  on 07/25/2022  He developed worsening neuropathy quickly, and I had to switch his treatment to FOLFIRI on 08/29/2022, every 2 weeks, he is overall tolerating well. -restaging CT 01/19/2023 showed disease progression with new pathologic right paratracheal mediastinal lymph nodes as well as increasing size of several liver metastases. 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 Tumor marker  stable in the 500 range. Hemoglobin improved  to 8.8, no need for blood transfusion today. Kidney and liver function tests pending. Thyroid  function normal. - Continue chemotherapy and immunotherapy every two weeks - Will order next scan in December - Scheduled follow-up appointment for December 4th  Chemotherapy-induced peripheral neuropathy Considering increasing gabapentin  dosage from two to three tablets per dose to manage symptoms. Discussed potential drowsiness with increased dosage. 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 Hemoglobin improved to 8.8, no need for blood transfusion today. Previous transfusion improved symptoms.  Lower extremity edema Intermittent lower extremity edema, possibly related to dexamethasone  use. Edema improved with elevation and ice application. Doppler study negative for deep vein thrombosis. Discussed potential correlation with dexamethasone  use and suggested reducing duration of dexamethasone  post-chemotherapy. - Continue elevation and ice application for edema - Consider reducing dexamethasone  duration post-chemotherapy to one or two days  Gastroesophageal reflux disease GERD managed with pantoprazole . Symptoms include gassy stomach and upset after chemotherapy. Pantoprazole  helps alleviate symptoms. - Continue pantoprazole  for acid reflux management  Plan - He is clinically stable, lab reviewed, adequate for treatment, will proceed to chemotherapy today and continue every 2 weeks - Follow-up in 2 weeks - He will gradually increase his Neurontin  dose from 2 to 3 tablets, 3 times daily   SUMMARY OF ONCOLOGIC HISTORY: Oncology History Overview Note   Cancer Staging  Gastric cancer [facility]) Staging form: Stomach, AJCC 8th Edition - Clinical stage from 10/18/2021: Stage  III (cT3, cN3a, cM0) - Signed by [name], MD on 11/10/2021     Gastric cancer (HCC)  10/18/2021 Procedure   EGD performed under the care of Dr. Dianna  Findings:  -A large, ulcerated, partially circumferential (involving two thirds of the lumen circumference) mass with no bleeding and stigmata of recent bleeding was found in the gastric antrum, at the pylorus and in the prepyloric region of the stomach. -Segmental severe inflammation characterized by congestion (edema) and erythema was found in the gastric antrum.   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 Negative for dysplasia.  Negative for helicobacter pylori  Mismatch Repair (MMR) Protein Imunohistochemistry (IHC): IHC Expression Result: MLH1: Preserved nuclear expression. MSH2: Preserved nuclear expression. MSH6: Preserved nuclear expression. PMS2: Preserved nuclear expression. Interpretation: NORMAL   10/31/2021 Initial Diagnosis   Gastric cancer (HCC)   11/01/2021 Tumor Marker   CEA = 1,989.58 (^)   11/06/2021 Procedure   Upper EUS, Dr. Burnette  Impression: - Normal esophagus. - A small amount of food (residue) in the stomach. - Congested, friable (with contact bleeding) and ulcerated mucosa in the prepyloric region of the stomach. - Normal examined duodenum. - There was no evidence of significant pathology in the left lobe of the liver. - Many abnormal lymph nodes (over 10) were visualized in the gastrohepatic ligament (level 18), celiac region (level 20), perigastric region, peripancreatic  region and aortocaval region. - Wall thickening was seen in the prepyloric region of the stomach. The thickening appeared to be primarily within the deep mucosa (Layer 2) but extended through  the muscularis propria. - Overall constellation of findings consistent with at least T3 N3 Mx (at least stage III) gastric adenocarcinoma.   11/08/2021 Imaging   EXAM: CT CHEST, ABDOMEN, AND PELVIS WITH CONTRAST  IMPRESSION: 1. Similar irregular wall thickening of the gastric antrum, consistent with patient's known primary gastric neoplasm. 2. Increased size of the upper abdominal lymph nodes, concerning for worsening nodal disease involvement. 3. Stable left adrenal nodule common nonspecific possibly a metastatic lesion or adenoma. 4. No evidence of metastatic disease in the chest. 5. Left-sided colonic diverticulosis without findings of acute diverticulitis. 6. Enlarged prostate gland. 7.  Aortic Atherosclerosis (ICD10-I70.0).   11/22/2021 - 04/26/2022 Chemotherapy   Patient is on Treatment Plan : GASTROESOPHAGEAL FLOT q14d X 4 cycles     11/27/2021 Imaging    IMPRESSION: 1. The mass within the gastric antrum is mildly decreased in size when compared with 11/08/2021. There is persistent FDG uptake associated with this mass compatible with residual tumor. 2. Persistent FDG avid lymph nodes within the gastrohepatic ligament, portacaval region, and mesentery. These are mildly decreased in size when compared with 11/08/2021. 3. Tracer avid left supraclavicular lymph node is also mildly decreased in size when compared with 11/08/2021. 4. Indeterminate left adrenal gland nodule with mild FDG uptake. This may represent a lipid poor adenoma. Metastatic disease not exclude. 5. Diffuse increased uptake throughout the bone marrow is favored to represent treatment related change. 6.  Aortic Atherosclerosis (ICD10-I70.0).     02/07/2022 Imaging    IMPRESSION: 1. No substantial change in  hypermetabolism associated with the distal gastric lesion. Although difficult to discern on noncontrast CT imaging, the soft tissue component appears to be decreased since the prior PET-CT. 2. Interval resolution of the hypermetabolic left supraclavicular lymph node. 3. Interval marked decrease of the hypermetabolic gastrohepatic ligament lymphadenopathy. Similar decrease in size and hypermetabolism associated with index nodes identified previously in the 4. porta hepatis and hypogastric region. 5. Stable left adrenal nodule with slight hypermetabolism. Nonspecific finding could represent lipid poor adenoma or metastatic deposit. 6. No new suspicious hypermetabolic disease on today's study. 7.  Aortic Atherosclerosis (ICD10-I70.0).   07/25/2022 - 08/15/2022 Chemotherapy   Patient is on Treatment Plan : GASTROESOPHAGEAL Ramucirumab  D1, 15 + Paclitaxel  D1,8,15 q28d     08/29/2022 - 01/24/2023 Chemotherapy   Patient is on Treatment Plan : COLORECTAL FOLFIRI q14d     02/05/2023 -  Chemotherapy   Patient is on Treatment Plan : GASTROESOPHAGEAL Ramucirumab  D1, 15 + Paclitaxel  D1,8,15 q28d        Discussed the use of AI scribe software for clinical note transcription with the patient, who gave verbal consent to proceed.  History of Present Illness Jonathan Maynard is a 53 year old male with gastric cancer who presents for follow-up.  He has experienced significant improvement in the swelling of his ankles and feet, managed by elevating his feet and applying ice. The swelling is not associated with pain, and a Doppler study was negative for deep vein thrombosis. He regularly wears compression socks.  He is taking gabapentin  for neuropathy with no worsening of symptoms and no difficulty with hand function. He takes dexamethasone  for three days following chemotherapy and pantoprazole  for gastric acid suppression, which is helpful post-chemotherapy.  His recent hemoglobin level  is 8.8 following a previous blood transfusion, and he feels better post-transfusion. His white blood cell count is normal, and his platelet count is 145. Tumor markers are in the 500 range.  He inquires about  ways to increase testosterone levels without affecting chemotherapy, noting occasional low libido and fatigue.     All other systems were reviewed with the patient and are negative.  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    SURGICAL HISTORY: Past Surgical History:  Procedure Laterality Date   BIOPSY  04/12/2021   Procedure: BIOPSY;  Surgeon: Dianna Specking, MD;  Location: [facility];  Service: Gastroenterology;;   ESOPHAGOGASTRODUODENOSCOPY N/A 04/12/2021   Procedure: ESOPHAGOGASTRODUODENOSCOPY (EGD);  Surgeon: Dianna Specking, MD;  Location: [facility];  Service: Gastroenterology;  Laterality: N/A;   ESOPHAGOGASTRODUODENOSCOPY N/A 11/06/2021   Procedure: ESOPHAGOGASTRODUODENOSCOPY (EGD);  Surgeon: Burnette Fallow, MD;  Location: [facility];  Service: Gastroenterology;  Laterality: N/A;   GASTRECTOMY N/A 05/20/2022   Procedure: OPEN PARTIAL GASTRECTOMY WITH GASTROJEJUNAL ANASTOMOSIS;  Surgeon: Stevie, Herlene Righter, MD;  Location: [facility];  Service: General;  Laterality: N/A;   HERNIA REPAIR Left    inguinal   IR IMAGING GUIDED PORT INSERTION  11/15/2021   LAPAROSCOPY N/A 05/20/2022   Procedure: LAPAROSCOPY DIAGNOSTIC;  Surgeon: Stevie Herlene Righter, MD;  Location: [facility];  Service: General;  Laterality: N/A;   UPPER ESOPHAGEAL ENDOSCOPIC ULTRASOUND (EUS) Bilateral 11/06/2021   Procedure: UPPER ESOPHAGEAL ENDOSCOPIC ULTRASOUND (EUS);  Surgeon: Burnette Fallow, MD;  Location: [facility];   Service: Gastroenterology;  Laterality: Bilateral;    I have reviewed the social history and family history with the patient and they are unchanged from previous note.  ALLERGIES:  has no known allergies.  MEDICATIONS:  Current Outpatient Medications  Medication Sig Dispense Refill   acetaminophen  (TYLENOL ) 500 MG tablet Take 1 tablet (500 mg total) by mouth 2 (two) times daily. 60 tablet 3   dexamethasone  (DECADRON ) 4 MG tablet Take 1 tablet (4 mg total) by mouth daily. Start the day after chemotherapy for 1-3 days.  Take with food. 30 tablet 1   diclofenac  Sodium (VOLTAREN  ARTHRITIS PAIN) 1 % GEL Apply 4 g topically 4 (four) times daily. 100 g 3   gabapentin  (NEURONTIN ) 600 MG tablet Take 2 tablets (1,200 mg total) by mouth 3 (three) times daily. 180 tablet 3   magic mouthwash (nystatin , lidocaine , diphenhydrAMINE , alum & mag hydroxide) suspension Swish for 5 seconds and swallow 5 mLs by mouth 3 (three) times daily as needed for mouth pain. 140 mL 0   ondansetron  (ZOFRAN -ODT) 4 MG disintegrating tablet Dissolve 1 tablet (4 mg total) by mouth every 6 (six) hours as needed for nausea or vomiting. 30 tablet 1   pantoprazole  (PROTONIX ) 40 MG tablet Take 1 tablet (40 mg) by mouth 2 times daily. 60 tablet 6   prochlorperazine  (COMPAZINE ) 10 MG tablet Take 1 tablet (10 mg total) by mouth every 6 (six) hours as needed for nausea or vomiting. 30 tablet 2   No current facility-administered medications for this visit.    PHYSICAL EXAMINATION: ECOG PERFORMANCE STATUS: 1 - Symptomatic but completely ambulatory  Vitals:   11/26/23 0800  BP: 130/70  Pulse: 100  Resp: 17  Temp: 99.1 F (37.3 C)  SpO2: 99%   Wt Readings from Last 3 Encounters:  11/26/23 180 lb 9.6 oz (81.9 kg)  11/12/23 172 lb 12 oz (78.4 kg)  10/29/23 173 lb 6.4 oz (78.7 kg)     GENERAL:alert, no distress and comfortable SKIN: skin color, texture, turgor are normal, no rashes or significant lesions EYES: normal,  Conjunctiva are pink and non-injected, sclera clear NECK: supple, thyroid  normal size, non-tender, without nodularity LYMPH:  no palpable lymphadenopathy in the cervical, axillary  LUNGS: clear to auscultation and percussion with normal breathing effort HEART: regular rate & rhythm and no murmurs and no lower extremity edema ABDOMEN:abdomen soft, non-tender and normal bowel sounds Musculoskeletal:no cyanosis of digits and no clubbing  NEURO: alert & oriented x 3 with fluent speech, no focal motor/sensory deficits  Physical Exam   LABORATORY DATA:  I have reviewed the data as listed    Latest Ref Rng & Units 11/26/2023    7:37 AM 11/12/2023    7:53 AM 10/29/2023    7:30 AM  CBC  WBC 4.0 - 10.5 K/uL 7.6  7.0  6.2   Hemoglobin 13.0 - 17.0 g/dL 8.8  8.0  8.5   Hematocrit 39.0 - 52.0 % 28.2  25.3  27.4   Platelets 150 - 400 K/uL 145  157  136         Latest Ref Rng & Units 11/12/2023    7:53 AM 10/29/2023    7:30 AM 10/15/2023    9:05 AM  CMP  Glucose 70 - 99 mg/dL 98  98  84   BUN 6 - 20 mg/dL 13  13  12    Creatinine 0.61 - 1.24 mg/dL 9.09  9.19  9.11   Sodium 135 - 145 mmol/L 139  142  141   Potassium 3.5 - 5.1 mmol/L 3.8  4.3  4.5   Chloride 98 - 111 mmol/L 107  110  109   CO2 22 - 32 mmol/L 26  28  28    Calcium  8.9 - 10.3 mg/dL 8.5  9.1  9.2   Total Protein 6.5 - 8.1 g/dL 6.6  6.7  7.2   Total Bilirubin 0.0 - 1.2 mg/dL 0.5  0.6  0.5   Alkaline Phos 38 - 126 U/L 72  79  78   AST 15 - 41 U/L 16  23  24    ALT 0 - 44 U/L 9  12  11        RADIOGRAPHIC STUDIES: I have personally reviewed the radiological images as listed and agreed with the findings in the report. No results found.    Orders Placed This Encounter  Procedures   CBC with Differential (Cancer Center Only)    Standing Status:   Future    Expected Date:   02/05/2024    Expiration Date:   02/04/2025   CMP (Cancer Center only)    Standing Status:   Future    Expected Date:   02/05/2024    Expiration Date:    02/04/2025   T4    Standing Status:   Future    Expected Date:   02/05/2024    Expiration Date:   02/04/2025   TSH    Standing Status:   Future    Expected Date:   02/05/2024    Expiration Date:   02/04/2025   Total Protein, Urine dipstick    Standing Status:   Future    Expected Date:   02/05/2024    Expiration Date:   02/04/2025   CBC with Differential (Cancer Center Only)    Standing Status:   Future    Expected Date:   02/19/2024    Expiration  Date:   02/18/2025   CMP (Cancer Center only)    Standing Status:   Future    Expected Date:   02/19/2024    Expiration Date:   02/18/2025   All questions were answered. The patient knows to call the clinic with any problems, questions or concerns. No barriers to learning was detected. The total time spent in the appointment was 25 minutes, including review of chart and various tests results, discussions about plan of care and coordination of care plan     Onita Mattock, MD 11/26/2023

## 2023-12-09 NOTE — Progress Notes (Unsigned)
 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 MMR normal. -baseline CEA 11/01/21 significantly elevated at 1,989.58. -EUS on 11/06/21 by Dr. Burnette, staged as T3 N3 (at least stage III) -staging CT CAP 11/08/21 showed: stable gastric antrum wall thickening; increased size of upper abdominal lymph nodes; stable left adrenal nodule, 1.9 cm. -PET scan showed FDG avid lymph nodes within the gastrohepatic ligament, portacaval region, and mesentery. Tracer avid left supraclavicular lymph node and indeterminate left adrenal gland nodule with mild FDG uptake. -he underwent [place] node biopsy on 1/17 which was negative for malignant cells, but no lymphoid tissue seen on biopsy  -repeated PET on 02/07/22 after 3 months neoadjuvant chemo showed resolved left supraclavicular lymph node, and significant improvement in regional lymph nodes and the primary tumor, stable and indeterminate left adrenal nodule.  -CT adrenal protocol showed the left adrenal nodule is likely benign adenoma, no biopsy is needed. -Completed neoadjvant chemo FLOT s/p 12 cycles 11/22/21 - 04/27/2022 -restaging PET on 05/16/22 showed significantly hypermetabolic activity at the primary gastric cancer in pylorus, and a small hypermetabolic mesenteric lymph node, no other evidence of metastasis. surgeon Dr. Stevie aware of PET findings -S/p distal gastrectomy 05/20/22, path showed ypT3N3a with clear margins, 7/18 + LNs, and lymphovascular invasion identified. There was some treatment effect in a few lymph nodes  nut not in the primary tumor -due to rising tumor marker, he underwent PET scan on July 07, 2022, which unfortunately showed hypermetabolic new adenopathy in right hilar and mesentery, diffuse liver metastasis, consistent with metastatic recurrence.  I personally reviewed the PET scan images with patient -Unfortunately his disease is not curable at this stage. -The goal of chemotherapy is palliative, to prolong his life -I discussed his next generation sequencing foundation one result, which showed EGFR amplification, no other targetable mutations.  His tumor was previously tested for HER2 which was negative (0-1+).  PD-L1 CPS score was 2%, no significant benefit of immunotherapy.  -He started paclitaxel  and ramucirumab  on 07/25/2022  He developed worsening neuropathy quickly. His treatment was changed to FOLFIRI on 08/29/2022, every 2 weeks, he is overall tolerating well. -restaging CT 01/19/2023 showed disease progression with new pathologic right paratracheal mediastinal lymph nodes as well as increasing size of several liver metastases. -His chemo was changed back to taxol , ramucizumab and Nivo on 02/05/2023, every 2 weeks per his request  -restaging CT 04/08/2023 showed partial response in liver and mediastinal nodes  -restaging PET 07/24/2023 showed mixed response comparing to PET in 07/2022 and CT in 04/2023, with significantly improvement in liver mets and slightly progression in nodes and left adrenal glad metastasis -restaging CT 10/12/2023 showed improved liver mets and stable other node mets  -will continue paclitaxel , ramucirumab  and Nivo every 2 weeks - 12/10/2023 -patient tolerating chemotherapy paclitaxel , ramucirumab , and Nivo well with some side effects.  Having worsening peripheral neuropathy, fatigue, and increased nausea.  Overall, managing well. - Restaging CT CAP ordered for early January 2026.  For now, continue chemotherapy with paclitaxel , remission serum lab, and Nivo every 2 weeks.     Peripheral neuropathy Patient states over the last month,  he has had slightly worse peripheral neuropathy.  Taking gabapentin  600 mg capsules, 2 capsules 3 times daily.  He recently started using nervine cream on hands and feet which seems to help more.  Using along with gabapentin .  He has mentioned pain and swelling of both lower extremities which seems to be associated with worsening peripheral neuropathy.  He did  to discuss this with Dr.[name] at his last visit and treatment.  He did have DVT study of bilateral lower extremities.  He was negative for DVT in either lower extremity.  Blister/callus on left foot The patient mentions a blister or sore on the bottom of his left foot which is causing him pain.  Sometimes pain is significant to the point where it hurts to walk.  There are 2 callus-like lesions on the lateral aspect of the plantar surface of the left foot.  They are red and warm with evidence of mild cellulitis.  There are 2 smaller callus-like lesions on the plantar surface of the left foot, just proximal of the 3rd and 4th toe.  These are also tender and red with evidence of mild cellulitis.  All of these lesions are tender with mild to moderate palpation.  Will start doxycycline  100 mg twice daily for the next 10 days to resolve cellulitis.  Recommend blister bandages to provide cushion and reduce friction, thus reducing pain.  Will reassess these blisters at next visit.  Consider referral to podiatrist as indicated.  Anemia Stable today with Hgb 8.3 and HCT 26.4.  Platelets also slightly low at 119.  No requirement for blood transfusion today.  Ferritin level is pending and will treat with IV iron  if indicated.  Levels are monitored closely with every visit.  Nausea Patient has experienced increased nausea.  Home medications are effective.  States he does need to use before nausea gets ahead of him.  Encouraged him to continue to be proactive when treating nausea.  He voiced  understanding.  Plan Labs reviewed. - Stable anemia -Mild thrombocytopenia - CMP unremarkable. Treat mild cellulitis of plantar surface of right foot with doxycycline  twice daily for 10 days.  Recommend blister bandages to provide cushion and protection. Continue gabapentin  and use of Nervine cream to manage neuropathy Pain is home medications to treat nausea as needed.  Encouraged him to stay ahead of symptoms. Labs and patient presentation are appropriate for treatment today. - Proceed with cycle 12 day 1 chemotherapy paclitaxel , ramucirumab , and nivolumab . Labs/flush, follow-up, and next treatment as scheduled. Surveillance CT CAP ordered for beginning of January 2026.  The patient understands the plans discussed today and is in agreement with them.  He knows to contact our office if he develops concerns prior to his next appointment.  I provided 25 minutes of face-to-face time during this encounter and > 50% was spent counseling as documented under my assessment and plan.    Jonathan FORBES Lessen, NP  [facility] - A DEPT OF [facility] [address] [place] [address] Dept: [phone] Dept Fax: [phone]   Orders Placed This Encounter  Procedures   CT CHEST ABDOMEN PELVIS W CONTRAST    Standing Status:   Future    Expected Date:   01/15/2024    Expiration Date:   12/09/2024    If indicated for the ordered procedure, I authorize the administration of contrast media per Radiology protocol:   Yes    Does the patient have  a contrast media/X-ray dye allergy?:   No    If indicated for the ordered procedure, I authorize the administration of oral contrast media per Radiology protocol:   Yes    Preferred imaging location?:   [facility]      CHIEF COMPLAINT:  CC: Gastric cancer  Current Treatment: Second line chemotherapy paclitaxel , ramucirumab , and nivolumab  every 2 weeks  INTERVAL HISTORY:   Jonathan Maynard is here today for repeat clinical assessment. He last saw Dr. Lanny 11/26/2023.  He reports neuropathy is slightly worse.  He continues to take gabapentin  600 mg, 2 capsules twice daily.  Started using Nervine cream in addition to gabapentin . He states this is helped quite a bit. The patient mentions a blister or sore on the bottom of his left foot which is causing him pain.  Sometimes pain is significant to the point where it hurts to walk.  There are 2 callus-like lesions on the lateral aspect of the plantar surface of the left foot.  They are red and warm with evidence of mild cellulitis.  There are 2 smaller callus-like lesions on the plantar surface of the left foot, just proximal of the 3rd and 4th toe.  These are also tender and red with evidence of mild cellulitis.  All of these lesions are tender with mild to moderate palpation.  He does report nausea and fatigue.  Slightly worse recently.  He is reluctant to take nausea medicine preemptively.  We did discuss the importance of staying ahead of symptoms to prevent problems and improve appetite.  He denies chest pain, chest pressure, or shortness of breath. He denies headaches or visual disturbances. He denies abdominal pain, nausea, vomiting, or changes in bowel or bladder habits. He denies fevers or chills. He denies pain. His appetite is fair. His weight has decreased 5 pounds over last 2 weeks.  I have reviewed the past medical history, past surgical history, social history and family history with the patient and they are unchanged from previous note.  ALLERGIES:  has no known allergies.  MEDICATIONS:  Current Outpatient Medications  Medication Sig Dispense Refill   doxycycline (VIBRA-TABS) 100 MG tablet Take 1 tablet (100 mg total) by mouth 2 (two) times daily. 20 tablet 0   acetaminophen  (TYLENOL ) 500 MG tablet Take 1 tablet (500 mg total) by mouth 2 (two) times daily. 60 tablet 3   dexamethasone  (DECADRON ) 4 MG tablet Take 1  tablet (4 mg total) by mouth daily. Start the day after chemotherapy for 1-3 days.  Take with food. 30 tablet 1   diclofenac  Sodium (VOLTAREN  ARTHRITIS PAIN) 1 % GEL Apply 4 g topically 4 (four) times daily. 100 g 3   gabapentin  (NEURONTIN ) 600 MG tablet Take 2 tablets (1,200 mg total) by mouth 3 (three) times daily. 180 tablet 3   magic mouthwash (nystatin , lidocaine , diphenhydrAMINE , alum & mag hydroxide) suspension Swish for 5 seconds and swallow 5 mLs by mouth 3 (three) times daily as needed for mouth pain. 140 mL 0   ondansetron  (ZOFRAN -ODT) 4 MG disintegrating tablet Dissolve 1 tablet (4 mg total) by mouth every 6 (six) hours as needed for nausea or vomiting. 30 tablet 1   pantoprazole  (PROTONIX ) 40 MG tablet Take 1 tablet (40 mg) by mouth 2 times daily. 60 tablet 6   prochlorperazine  (COMPAZINE ) 10 MG tablet Take 1 tablet (10 mg total) by mouth every 6 (six) hours as needed for nausea or vomiting. 30 tablet 2   No current facility-administered medications for this  visit.   Facility-Administered Medications Ordered in Other Visits  Medication Dose Route Frequency Provider Last Rate Last Admin   0.9 %  sodium chloride  infusion   Intravenous Continuous Jonathan Callander, MD 10 mL/hr at 12/10/23 0945 New Bag at 12/10/23 0945   PACLitaxel  (TAXOL ) 150 mg in sodium chloride  0.9 % 250 mL chemo infusion (</= 80mg /m2)  80 mg/m2 (Treatment Plan Recorded) Intravenous Once Jonathan Callander, MD 275 mL/hr at 12/10/23 1144 150 mg at 12/10/23 1144    HISTORY OF PRESENT ILLNESS:   Oncology History Overview Note   Cancer Staging  [facility]) Staging form: Stomach, AJCC 8th Edition - Clinical stage from 10/18/2021: Stage III (cT3, cN3a, cM0) - Signed by Jonathan Callander, MD on 11/10/2021     Gastric cancer (HCC)  10/18/2021 Procedure   EGD performed under the care of Dr. Dianna  Findings:  -A large, ulcerated, partially circumferential (involving two thirds of the lumen circumference) mass with no bleeding and  stigmata of recent bleeding was found in the gastric antrum, at the pylorus and in the prepyloric region of the stomach. -Segmental severe inflammation characterized by congestion (edema) and erythema was found in the gastric antrum.   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 Negative for dysplasia.  Negative for helicobacter pylori  Mismatch Repair (MMR) Protein Imunohistochemistry (IHC): IHC Expression Result: MLH1: Preserved nuclear expression. MSH2: Preserved nuclear expression. MSH6: Preserved nuclear expression. PMS2: Preserved nuclear expression. Interpretation: NORMAL   10/31/2021 Initial Diagnosis   Gastric cancer (HCC)   11/01/2021 Tumor Marker   CEA = 1,989.58 (^)   11/06/2021 Procedure   Upper EUS, Dr. Burnette  Impression: - Normal esophagus. - A small amount of food (residue) in the stomach. - Congested, friable (with contact bleeding) and ulcerated mucosa in the prepyloric region of the stomach. - Normal examined duodenum. - There was no evidence of significant pathology in the left lobe of the liver. - Many abnormal lymph nodes (over 10) were visualized in the gastrohepatic ligament (level 18), celiac region (level 20), perigastric region, peripancreatic region and aortocaval region. - Wall thickening was seen in the prepyloric region of the stomach. The thickening appeared to be primarily within the deep mucosa (Layer 2) but extended through the muscularis propria. - Overall constellation of findings consistent with at least T3 N3 Mx (at least  stage III) gastric adenocarcinoma.   11/08/2021 Imaging   EXAM: CT CHEST, ABDOMEN, AND PELVIS WITH CONTRAST  IMPRESSION: 1. Similar irregular wall thickening of the gastric antrum, consistent with patient's known primary gastric neoplasm. 2. Increased size of the upper abdominal lymph nodes, concerning for worsening nodal disease involvement. 3. Stable left adrenal nodule common nonspecific possibly a metastatic lesion or adenoma. 4. No evidence of metastatic disease in the chest. 5. Left-sided colonic diverticulosis without findings of acute diverticulitis. 6. Enlarged prostate gland. 7.  Aortic Atherosclerosis (ICD10-I70.0).   11/22/2021 - 04/26/2022 Chemotherapy   Patient is on Treatment Plan : GASTROESOPHAGEAL FLOT q14d X 4 cycles     11/27/2021 Imaging    IMPRESSION: 1. The mass within the gastric antrum is mildly decreased in size when compared with 11/08/2021.  There is persistent FDG uptake associated with this mass compatible with residual tumor. 2. Persistent FDG avid lymph nodes within the gastrohepatic ligament, portacaval region, and mesentery. These are mildly decreased in size when compared with 11/08/2021. 3. Tracer avid left supraclavicular lymph node is also mildly decreased in size when compared with 11/08/2021. 4. Indeterminate left adrenal gland nodule with mild FDG uptake. This may represent a lipid poor adenoma. Metastatic disease not exclude. 5. Diffuse increased uptake throughout the bone marrow is favored to represent treatment related change. 6.  Aortic Atherosclerosis (ICD10-I70.0).     02/07/2022 Imaging    IMPRESSION: 1. No substantial change in hypermetabolism associated with the distal gastric lesion. Although difficult to discern on noncontrast CT imaging, the soft tissue component appears to be decreased since the prior PET-CT. 2. Interval resolution of the hypermetabolic left supraclavicular lymph node. 3. Interval marked decrease of the  hypermetabolic gastrohepatic ligament lymphadenopathy. Similar decrease in size and hypermetabolism associated with index nodes identified previously in the 4. porta hepatis and hypogastric region. 5. Stable left adrenal nodule with slight hypermetabolism. Nonspecific finding could represent lipid poor adenoma or metastatic deposit. 6. No new suspicious hypermetabolic disease on today's study. 7.  Aortic Atherosclerosis (ICD10-I70.0).   07/25/2022 - 08/15/2022 Chemotherapy   Patient is on Treatment Plan : GASTROESOPHAGEAL Ramucirumab  D1, 15 + Paclitaxel  D1,8,15 q28d     08/29/2022 - 01/24/2023 Chemotherapy   Patient is on Treatment Plan : COLORECTAL FOLFIRI q14d     02/05/2023 -  Chemotherapy   Patient is on Treatment Plan : GASTROESOPHAGEAL Ramucirumab  D1, 15 + Paclitaxel  D1,8,15 q28d         REVIEW OF SYSTEMS:   Constitutional: Denies fevers, chills, or night sweats.  His appetite is decreased.  Has had a 5 pound weight loss since his last visit. Eyes: Denies blurriness of vision Ears, nose, mouth, throat, and face: Denies mucositis or sore throat Respiratory: Denies cough, dyspnea or wheezes Cardiovascular: Denies palpitation, chest discomfort or lower extremity swelling Gastrointestinal:  Denies heartburn or change in bowel habits.  He reports increased nausea. Skin: Denies abnormal skin rashes Lymphatics: Denies new lymphadenopathy or easy bruising Neurological:Denies numbness, tingling or new weaknesses Behavioral/Psych: Mood is stable, no new changes  All other systems were reviewed with the patient and are negative.   VITALS:   Today's Vitals   12/10/23 1212  BP: 130/80  Pulse: 87  Resp: 17  Temp: 98.4 F (36.9 C)  SpO2: 100%  Weight: 175 lb (79.4 kg)  Height: 5' (1.524 m)   Body mass index is 34.18 kg/m.   Wt Readings from Last 3 Encounters:  12/10/23 175 lb 12.8 oz (79.7 kg)  12/10/23 175 lb (79.4 kg)  11/26/23 180 lb 9.6 oz (81.9 kg)    Body mass index  is 34.18 kg/m.  Performance status (ECOG): 2 - Symptomatic, <50% confined to bed  PHYSICAL EXAM:   GENERAL:alert, no distress and comfortable SKIN: skin color, texture, turgor are normal, no rashes or significant lesions EYES: normal, Conjunctiva are pink and non-injected, sclera clear OROPHARYNX:no exudate, no erythema and lips, buccal mucosa, and tongue normal  NECK: supple, thyroid  normal size, non-tender, without nodularity LYMPH:  no palpable lymphadenopathy in the cervical, axillary or inguinal LUNGS: clear to auscultation and percussion with normal breathing effort HEART: regular rate & rhythm and no murmurs and no lower extremity edema ABDOMEN:abdomen soft, non-tender and normal bowel sounds Musculoskeletal:no cyanosis of digits and no clubbing  NEURO: alert & oriented x 3 with  fluent speech, no focal motor/sensory deficits  LABORATORY DATA:  I have reviewed the data as listed    Component Value Date/Time   NA 137 12/10/2023 0758   K 4.2 12/10/2023 0758   CL 105 12/10/2023 0758   CO2 23 12/10/2023 0758   GLUCOSE 87 12/10/2023 0758   BUN 19 12/10/2023 0758   CREATININE 0.87 12/10/2023 0758   CALCIUM  8.9 12/10/2023 0758   PROT 6.8 12/10/2023 0758   ALBUMIN  4.0 12/10/2023 0758   AST 24 12/10/2023 0758   ALT 13 12/10/2023 0758   ALKPHOS 72 12/10/2023 0758   BILITOT 0.5 12/10/2023 0758   GFRNONAA >60 12/10/2023 0758    Lab Results  Component Value Date   WBC 10.6 (H) 12/10/2023   NEUTROABS 5.6 12/10/2023   HGB 8.3 (L) 12/10/2023   HCT 26.4 (L) 12/10/2023   MCV 92.3 12/10/2023   PLT 119 (L) 12/10/2023   RADIOGRAPHIC STUDIES: VAS US  LOWER EXTREMITY VENOUS (DVT) Result Date: 11/13/2023  Lower Venous DVT Study Patient Name:  Jonathan Maynard  Date of Exam:   11/13/2023 Medical Rec #: [id number]                 Accession #:    [id number] Date of Birth: 1970-12-30                 Patient Gender: M Patient Age:   53 years Exam Location:  [address] Procedure:       VAS US  LOWER EXTREMITY VENOUS (DVT) Referring Phys: ONITA MATTOCK --------------------------------------------------------------------------------  Indications: Edema.  Risk Factors: Cancer Gastric. Performing Technologist: Garnette Rockers  Examination Guidelines: A complete evaluation includes B-mode imaging, spectral Doppler, color Doppler, and power Doppler as needed of all accessible portions of each vessel. Bilateral testing is considered an integral part of a complete examination. Limited examinations for reoccurring indications may be performed as noted. The reflux portion of the exam is performed with the patient in reverse Trendelenburg.  +-----+---------------+---------+-----------+----------+--------------+ RIGHTCompressibilityPhasicitySpontaneityPropertiesThrombus Aging +-----+---------------+---------+-----------+----------+--------------+ CFV  Full           Yes      Yes                                 +-----+---------------+---------+-----------+----------+--------------+   +---------+---------------+---------+-----------+----------+--------------+ LEFT     CompressibilityPhasicitySpontaneityPropertiesThrombus Aging +---------+---------------+---------+-----------+----------+--------------+ CFV      Full           Yes      Yes                                 +---------+---------------+---------+-----------+----------+--------------+ SFJ      Full                                                        +---------+---------------+---------+-----------+----------+--------------+ FV Prox  Full                                                        +---------+---------------+---------+-----------+----------+--------------+ FV Mid   Full  Yes      Yes                                 +---------+---------------+---------+-----------+----------+--------------+ FV DistalFull                                                         +---------+---------------+---------+-----------+----------+--------------+ PFV      Full                                                        +---------+---------------+---------+-----------+----------+--------------+ POP      Full           Yes      Yes                                 +---------+---------------+---------+-----------+----------+--------------+ PTV      Full                    Yes                                 +---------+---------------+---------+-----------+----------+--------------+ PERO     Full                    Yes                                 +---------+---------------+---------+-----------+----------+--------------+     Summary: RIGHT: - No evidence of common femoral vein obstruction.   LEFT: - There is no evidence of deep vein thrombosis in the lower extremity.  - No cystic structure found in the popliteal fossa.  *See table(s) above for measurements and observations. Electronically signed by Norman Serve on 11/13/2023 at 1:11:38 PM.    Final

## 2023-12-09 NOTE — Assessment & Plan Note (Signed)
  cT3N3Mx, with hypermetabolic left supraclavicular node, and indeterminate adrenal nodule, MMR normal ypT3ypN3a, liver and nodes recurrence in 07/2022 -diagnosed 10/18/21 by EGD for 6 month f/u of gastric ulcers, showed mucosal intramucosal adenocarcinoma. MMR normal. -baseline CEA 11/01/21 significantly elevated at 1,989.58. -EUS on 11/06/21 by Dr. Burnette, staged as T3 N3 (at least stage III) -staging CT CAP 11/08/21 showed: stable gastric antrum wall thickening; increased size of upper abdominal lymph nodes; stable left adrenal nodule, 1.9 cm. -PET scan showed FDG avid lymph nodes within the gastrohepatic ligament, portacaval region, and mesentery. Tracer avid left supraclavicular lymph node and indeterminate left adrenal gland nodule with mild FDG uptake. -he underwent [place] node biopsy on 1/17 which was negative for malignant cells, but no lymphoid tissue seen on biopsy  -repeated PET on 02/07/22 after 3 months neoadjuvant chemo showed resolved left supraclavicular lymph node, and significant improvement in regional lymph nodes and the primary tumor, stable and indeterminate left adrenal nodule.  -CT adrenal protocol showed the left adrenal nodule is likely benign adenoma, no biopsy is needed. -Completed neoadjvant chemo FLOT s/p 12 cycles 11/22/21 - 04/27/2022 -restaging PET on 05/16/22 showed significantly hypermetabolic activity at the primary gastric cancer in pylorus, and a small hypermetabolic mesenteric lymph node, no other evidence of metastasis. surgeon Dr. Stevie aware of PET findings -S/p distal gastrectomy 05/20/22, path showed ypT3N3a with clear margins, 7/18 + LNs, and lymphovascular invasion identified. There was some treatment effect in a few lymph nodes nut not in the primary tumor -due to rising tumor marker, he underwent PET scan on July 07, 2022, which unfortunately showed hypermetabolic new adenopathy in right hilar and mesentery, diffuse liver metastasis, consistent with metastatic  recurrence.  I personally reviewed the PET scan images with patient -Unfortunately his disease is not curable at this stage. -The goal of chemotherapy is palliative, to prolong his life -I discussed his next generation sequencing foundation one result, which showed EGFR amplification, no other targetable mutations.  His tumor was previously tested for HER2 which was negative (0-1+).  PD-L1 CPS score was 2%, no significant benefit of immunotherapy.  -He started paclitaxel  and ramucirumab  on 07/25/2022  He developed worsening neuropathy quickly, and I had to switch his treatment to FOLFIRI on 08/29/2022, every 2 weeks, he is overall tolerating well. -restaging CT 01/19/2023 showed disease progression with new pathologic right paratracheal mediastinal lymph nodes as well as increasing size of several liver metastases. -I changed his chemo to taxol , ramucizumab and Nivo on 02/05/2023, every 2 weeks per his request  -restaging CT 04/08/2023 showed partial response in liver and mediastinal nodes  -restaging PET 07/24/2023 showed mixed response comparing to PET in 07/2022 and CT in 04/2023, with significantly improvement in liver mets and slightly progression in nodes and left adrenal glad metastasis -restaging CT 10/12/2023 showed improved liver mets and stable other node mets  -will continue paclitaxel , ramucirumab  and Nivo every 2 weeks

## 2023-12-10 ENCOUNTER — Inpatient Hospital Stay: Attending: Physician Assistant | Admitting: Nurse Practitioner

## 2023-12-10 ENCOUNTER — Encounter: Payer: Self-pay | Admitting: Nurse Practitioner

## 2023-12-10 ENCOUNTER — Inpatient Hospital Stay: Attending: Physician Assistant

## 2023-12-10 ENCOUNTER — Encounter: Payer: Self-pay | Admitting: Hematology

## 2023-12-10 ENCOUNTER — Inpatient Hospital Stay

## 2023-12-10 VITALS — BP 130/80 | HR 87 | Temp 98.4°F | Resp 17 | Ht 60.0 in | Wt 175.0 lb

## 2023-12-10 VITALS — BP 130/80 | HR 87 | Temp 98.4°F | Resp 17 | Ht 60.0 in | Wt 175.8 lb

## 2023-12-10 DIAGNOSIS — L0390 Cellulitis, unspecified: Secondary | ICD-10-CM | POA: Insufficient documentation

## 2023-12-10 DIAGNOSIS — Z5112 Encounter for antineoplastic immunotherapy: Secondary | ICD-10-CM | POA: Insufficient documentation

## 2023-12-10 DIAGNOSIS — C787 Secondary malignant neoplasm of liver and intrahepatic bile duct: Secondary | ICD-10-CM | POA: Diagnosis not present

## 2023-12-10 DIAGNOSIS — Z79620 Long term (current) use of immunosuppressive biologic: Secondary | ICD-10-CM | POA: Diagnosis not present

## 2023-12-10 DIAGNOSIS — D6481 Anemia due to antineoplastic chemotherapy: Secondary | ICD-10-CM | POA: Insufficient documentation

## 2023-12-10 DIAGNOSIS — T451X5D Adverse effect of antineoplastic and immunosuppressive drugs, subsequent encounter: Secondary | ICD-10-CM | POA: Diagnosis not present

## 2023-12-10 DIAGNOSIS — G620 Drug-induced polyneuropathy: Secondary | ICD-10-CM | POA: Diagnosis not present

## 2023-12-10 DIAGNOSIS — D696 Thrombocytopenia, unspecified: Secondary | ICD-10-CM | POA: Insufficient documentation

## 2023-12-10 DIAGNOSIS — D649 Anemia, unspecified: Secondary | ICD-10-CM

## 2023-12-10 DIAGNOSIS — Z79899 Other long term (current) drug therapy: Secondary | ICD-10-CM | POA: Diagnosis not present

## 2023-12-10 DIAGNOSIS — C163 Malignant neoplasm of pyloric antrum: Secondary | ICD-10-CM | POA: Diagnosis not present

## 2023-12-10 DIAGNOSIS — Z7952 Long term (current) use of systemic steroids: Secondary | ICD-10-CM | POA: Diagnosis not present

## 2023-12-10 DIAGNOSIS — E278 Other specified disorders of adrenal gland: Secondary | ICD-10-CM | POA: Diagnosis not present

## 2023-12-10 DIAGNOSIS — D500 Iron deficiency anemia secondary to blood loss (chronic): Secondary | ICD-10-CM

## 2023-12-10 DIAGNOSIS — Z5111 Encounter for antineoplastic chemotherapy: Secondary | ICD-10-CM | POA: Insufficient documentation

## 2023-12-10 LAB — CMP (CANCER CENTER ONLY)
ALT: 13 U/L (ref 0–44)
AST: 24 U/L (ref 15–41)
Albumin: 4 g/dL (ref 3.5–5.0)
Alkaline Phosphatase: 72 U/L (ref 38–126)
Anion gap: 9 (ref 5–15)
BUN: 19 mg/dL (ref 6–20)
CO2: 23 mmol/L (ref 22–32)
Calcium: 8.9 mg/dL (ref 8.9–10.3)
Chloride: 105 mmol/L (ref 98–111)
Creatinine: 0.87 mg/dL (ref 0.61–1.24)
GFR, Estimated: >60 mL/min (ref >60)
Glucose, Bld: 87 mg/dL (ref 70–99)
Potassium: 4.2 mmol/L (ref 3.5–5.1)
Sodium: 137 mmol/L (ref 135–145)
Total Bilirubin: 0.5 mg/dL (ref 0.0–1.2)
Total Protein: 6.8 g/dL (ref 6.5–8.1)

## 2023-12-10 LAB — CBC WITH DIFFERENTIAL (CANCER CENTER ONLY)
Abs Immature Granulocytes: 0.14 K/uL — ABNORMAL HIGH (ref 0.00–0.07)
Basophils Absolute: 0 K/uL (ref 0.0–0.1)
Basophils Relative: 0 %
Eosinophils Absolute: 0.1 K/uL (ref 0.0–0.5)
Eosinophils Relative: 1 %
HCT: 26.4 % — ABNORMAL LOW (ref 39.0–52.0)
Hemoglobin: 8.3 g/dL — ABNORMAL LOW (ref 13.0–17.0)
Immature Granulocytes: 1 %
Lymphocytes Relative: 30 %
Lymphs Abs: 3.1 K/uL (ref 0.7–4.0)
MCH: 29 pg (ref 26.0–34.0)
MCHC: 31.4 g/dL (ref 30.0–36.0)
MCV: 92.3 fL (ref 80.0–100.0)
Monocytes Absolute: 1.6 K/uL — ABNORMAL HIGH (ref 0.1–1.0)
Monocytes Relative: 15 %
Neutro Abs: 5.6 K/uL (ref 1.7–7.7)
Neutrophils Relative %: 53 %
Platelet Count: 119 K/uL — ABNORMAL LOW (ref 150–400)
RBC: 2.86 MIL/uL — ABNORMAL LOW (ref 4.22–5.81)
RDW: 27.6 % — ABNORMAL HIGH (ref 11.5–15.5)
WBC Count: 10.6 K/uL — ABNORMAL HIGH (ref 4.0–10.5)
nRBC: 0.6 % — ABNORMAL HIGH (ref 0.0–0.2)

## 2023-12-10 LAB — SAMPLE TO BLOOD BANK

## 2023-12-10 LAB — TOTAL PROTEIN, URINE DIPSTICK: Protein, ur: NEGATIVE mg/dL

## 2023-12-10 LAB — FERRITIN: Ferritin: 78 ng/mL (ref 24–336)

## 2023-12-10 LAB — TSH: TSH: 4.63 u[IU]/mL — ABNORMAL HIGH (ref 0.350–4.500)

## 2023-12-10 MED ORDER — FAMOTIDINE IN NACL 20-0.9 MG/50ML-% IV SOLN
20.0000 mg | Freq: Once | INTRAVENOUS | Status: AC
  Administered 2023-12-10: 20 mg via INTRAVENOUS
  Filled 2023-12-10: qty 50

## 2023-12-10 MED ORDER — PALONOSETRON HCL INJECTION 0.25 MG/5ML
0.2500 mg | Freq: Once | INTRAVENOUS | Status: AC
  Administered 2023-12-10: 0.25 mg via INTRAVENOUS
  Filled 2023-12-10: qty 5

## 2023-12-10 MED ORDER — ACETAMINOPHEN 325 MG PO TABS
650.0000 mg | ORAL_TABLET | Freq: Once | ORAL | Status: AC
  Administered 2023-12-10: 650 mg via ORAL
  Filled 2023-12-10: qty 2

## 2023-12-10 MED ORDER — SODIUM CHLORIDE 0.9 % IV SOLN: INTRAVENOUS | Status: DC

## 2023-12-10 MED ORDER — SODIUM CHLORIDE 0.9 % IV SOLN
240.0000 mg | Freq: Once | INTRAVENOUS | Status: AC
  Administered 2023-12-10: 240 mg via INTRAVENOUS
  Filled 2023-12-10: qty 24

## 2023-12-10 MED ORDER — SODIUM CHLORIDE 0.9 % IV SOLN
80.0000 mg/m2 | Freq: Once | INTRAVENOUS | Status: AC
  Administered 2023-12-10: 150 mg via INTRAVENOUS
  Filled 2023-12-10: qty 25

## 2023-12-10 MED ORDER — DIPHENHYDRAMINE HCL 50 MG/ML IJ SOLN
25.0000 mg | Freq: Once | INTRAMUSCULAR | Status: AC
  Administered 2023-12-10: 25 mg via INTRAVENOUS
  Filled 2023-12-10: qty 1

## 2023-12-10 MED ORDER — SODIUM CHLORIDE 0.9 % IV SOLN
8.0000 mg/kg | Freq: Once | INTRAVENOUS | Status: AC
  Administered 2023-12-10: 600 mg via INTRAVENOUS
  Filled 2023-12-10: qty 10

## 2023-12-10 MED ORDER — DOXYCYCLINE HYCLATE 100 MG PO TABS: 100.0000 mg | ORAL_TABLET | Freq: Two times a day (BID) | ORAL | 0 refills | Status: AC

## 2023-12-10 NOTE — Patient Instructions (Signed)
 [facility] - A DEPT OF [facility]  Discharge Instructions: Thank you for choosing [facility] to provide your oncology and hematology care.   If you have a lab appointment with the Cancer Center, please go directly to the Cancer Center and check in at the registration area.   Wear comfortable clothing and clothing appropriate for easy access to any Portacath or PICC line.   We strive to give you quality time with your provider. You may need to reschedule your appointment if you arrive late (15 or more minutes).  Arriving late affects you and other patients whose appointments are after yours.  Also, if you miss three or more appointments without notifying the office, you may be dismissed from the clinic at the provider's discretion.      For prescription refill requests, have your pharmacy contact our office and allow 72 hours for refills to be completed.    Today you received the following chemotherapy and/or immunotherapy agents opdivo , cyramza , taxol       To help prevent nausea and vomiting after your treatment, we encourage you to take your nausea medication as directed.  BELOW ARE SYMPTOMS THAT SHOULD BE REPORTED IMMEDIATELY: *FEVER GREATER THAN 100.4 F (38 C) OR HIGHER *CHILLS OR SWEATING *NAUSEA AND VOMITING THAT IS NOT CONTROLLED WITH YOUR NAUSEA MEDICATION *UNUSUAL SHORTNESS OF BREATH *UNUSUAL BRUISING OR BLEEDING *URINARY PROBLEMS (pain or burning when urinating, or frequent urination) *BOWEL PROBLEMS (unusual diarrhea, constipation, pain near the anus) TENDERNESS IN MOUTH AND THROAT WITH OR WITHOUT PRESENCE OF ULCERS (sore throat, sores in mouth, or a toothache) UNUSUAL RASH, SWELLING OR PAIN  UNUSUAL VAGINAL DISCHARGE OR ITCHING   Items with * indicate a potential emergency and should be followed up as soon as possible or go to the Emergency Department if any problems should occur.  Please show the CHEMOTHERAPY ALERT CARD or  IMMUNOTHERAPY ALERT CARD at check-in to the Emergency Department and triage nurse.  Should you have questions after your visit or need to cancel or reschedule your appointment, please contact [facility] - A DEPT OF [facility]  Dept: [phone]  and follow the prompts.  Office hours are 8:00 a.m. to 4:30 p.m. Monday - Friday. Please note that voicemails left after 4:00 p.m. may not be returned until the following business day.  We are closed weekends and major holidays. You have access to a nurse at all times for urgent questions. Please call the main number to the clinic Dept: [phone] and follow the prompts.   For any non-urgent questions, you may also contact your provider using MyChart. We now offer e-Visits for anyone 56 and older to request care online for non-urgent symptoms. For details visit [email or web address].   Also download the MyChart app! Go to the app store, search MyChart, open the app, select [facility], and log in with your MyChart username and password.

## 2023-12-11 ENCOUNTER — Inpatient Hospital Stay: Admitting: Nurse Practitioner

## 2023-12-11 ENCOUNTER — Inpatient Hospital Stay

## 2023-12-11 LAB — T4: T4, Total: 7.5 ug/dL (ref 4.5–12.0)

## 2023-12-23 NOTE — Assessment & Plan Note (Signed)
  cT3N3Mx, with hypermetabolic left supraclavicular node, and indeterminate adrenal nodule, MMR normal ypT3ypN3a, liver and nodes recurrence in 07/2022 -diagnosed 10/18/21 by EGD for 6 month f/u of gastric ulcers, showed mucosal intramucosal adenocarcinoma. MMR normal. -baseline CEA 11/01/21 significantly elevated at 1,989.58. -EUS on 11/06/21 by Dr. Burnette, staged as T3 N3 (at least stage III) -staging CT CAP 11/08/21 showed: stable gastric antrum wall thickening; increased size of upper abdominal lymph nodes; stable left adrenal nodule, 1.9 cm. -PET scan showed FDG avid lymph nodes within the gastrohepatic ligament, portacaval region, and mesentery. Tracer avid left supraclavicular lymph node and indeterminate left adrenal gland nodule with mild FDG uptake. -he underwent [place] node biopsy on 1/17 which was negative for malignant cells, but no lymphoid tissue seen on biopsy  -repeated PET on 02/07/22 after 3 months neoadjuvant chemo showed resolved left supraclavicular lymph node, and significant improvement in regional lymph nodes and the primary tumor, stable and indeterminate left adrenal nodule.  -CT adrenal protocol showed the left adrenal nodule is likely benign adenoma, no biopsy is needed. -Completed neoadjvant chemo FLOT s/p 12 cycles 11/22/21 - 04/27/2022 -restaging PET on 05/16/22 showed significantly hypermetabolic activity at the primary gastric cancer in pylorus, and a small hypermetabolic mesenteric lymph node, no other evidence of metastasis. surgeon Dr. Stevie aware of PET findings -S/p distal gastrectomy 05/20/22, path showed ypT3N3a with clear margins, 7/18 + LNs, and lymphovascular invasion identified. There was some treatment effect in a few lymph nodes nut not in the primary tumor -due to rising tumor marker, he underwent PET scan on July 07, 2022, which unfortunately showed hypermetabolic new adenopathy in right hilar and mesentery, diffuse liver metastasis, consistent with metastatic  recurrence.  I personally reviewed the PET scan images with patient -Unfortunately his disease is not curable at this stage. -The goal of chemotherapy is palliative, to prolong his life -I discussed his next generation sequencing foundation one result, which showed EGFR amplification, no other targetable mutations.  His tumor was previously tested for HER2 which was negative (0-1+).  PD-L1 CPS score was 2%, no significant benefit of immunotherapy.  -He started paclitaxel  and ramucirumab  on 07/25/2022  He developed worsening neuropathy quickly, and I had to switch his treatment to FOLFIRI on 08/29/2022, every 2 weeks, he is overall tolerating well. -restaging CT 01/19/2023 showed disease progression with new pathologic right paratracheal mediastinal lymph nodes as well as increasing size of several liver metastases. -I changed his chemo to taxol , ramucizumab and Nivo on 02/05/2023, every 2 weeks per his request  -restaging CT 04/08/2023 showed partial response in liver and mediastinal nodes  -restaging PET 07/24/2023 showed mixed response comparing to PET in 07/2022 and CT in 04/2023, with significantly improvement in liver mets and slightly progression in nodes and left adrenal glad metastasis -restaging CT 10/12/2023 showed improved liver mets and stable other node mets  -will continue paclitaxel , ramucirumab  and Nivo every 2 weeks

## 2023-12-24 ENCOUNTER — Inpatient Hospital Stay: Admitting: Hematology

## 2023-12-24 ENCOUNTER — Inpatient Hospital Stay

## 2023-12-24 ENCOUNTER — Other Ambulatory Visit: Payer: Self-pay

## 2023-12-24 VITALS — BP 120/75 | HR 92 | Temp 98.4°F | Resp 18 | Wt 179.5 lb

## 2023-12-24 DIAGNOSIS — C163 Malignant neoplasm of pyloric antrum: Secondary | ICD-10-CM

## 2023-12-24 DIAGNOSIS — D649 Anemia, unspecified: Secondary | ICD-10-CM

## 2023-12-24 DIAGNOSIS — D500 Iron deficiency anemia secondary to blood loss (chronic): Secondary | ICD-10-CM

## 2023-12-24 DIAGNOSIS — Z5112 Encounter for antineoplastic immunotherapy: Secondary | ICD-10-CM | POA: Diagnosis not present

## 2023-12-24 LAB — CBC WITH DIFFERENTIAL (CANCER CENTER ONLY)
Abs Immature Granulocytes: 0.05 K/uL (ref 0.00–0.07)
Basophils Absolute: 0 K/uL (ref 0.0–0.1)
Basophils Relative: 0 %
Eosinophils Absolute: 0.1 K/uL (ref 0.0–0.5)
Eosinophils Relative: 1 %
HCT: 24.7 % — ABNORMAL LOW (ref 39.0–52.0)
Hemoglobin: 7.5 g/dL — ABNORMAL LOW (ref 13.0–17.0)
Immature Granulocytes: 1 %
Lymphocytes Relative: 32 %
Lymphs Abs: 3 K/uL (ref 0.7–4.0)
MCH: 28.7 pg (ref 26.0–34.0)
MCHC: 30.4 g/dL (ref 30.0–36.0)
MCV: 94.6 fL (ref 80.0–100.0)
Monocytes Absolute: 1.4 K/uL — ABNORMAL HIGH (ref 0.1–1.0)
Monocytes Relative: 15 %
Neutro Abs: 4.8 K/uL (ref 1.7–7.7)
Neutrophils Relative %: 51 %
Platelet Count: 121 K/uL — ABNORMAL LOW (ref 150–400)
RBC: 2.61 MIL/uL — ABNORMAL LOW (ref 4.22–5.81)
RDW: 27.4 % — ABNORMAL HIGH (ref 11.5–15.5)
WBC Count: 9.3 K/uL (ref 4.0–10.5)
nRBC: 0 % (ref 0.0–0.2)

## 2023-12-24 LAB — CMP (CANCER CENTER ONLY)
ALT: 14 U/L (ref 0–44)
AST: 26 U/L (ref 15–41)
Albumin: 3.9 g/dL (ref 3.5–5.0)
Alkaline Phosphatase: 79 U/L (ref 38–126)
Anion gap: 9 (ref 5–15)
BUN: 13 mg/dL (ref 6–20)
CO2: 25 mmol/L (ref 22–32)
Calcium: 9.1 mg/dL (ref 8.9–10.3)
Chloride: 104 mmol/L (ref 98–111)
Creatinine: 0.89 mg/dL (ref 0.61–1.24)
GFR, Estimated: >60 mL/min (ref >60)
Glucose, Bld: 83 mg/dL (ref 70–99)
Potassium: 4.5 mmol/L (ref 3.5–5.1)
Sodium: 138 mmol/L (ref 135–145)
Total Bilirubin: 0.4 mg/dL (ref 0.0–1.2)
Total Protein: 6.5 g/dL (ref 6.5–8.1)

## 2023-12-24 LAB — TOTAL PROTEIN, URINE DIPSTICK: Protein, ur: 100 mg/dL — AB

## 2023-12-24 LAB — SAMPLE TO BLOOD BANK

## 2023-12-24 LAB — CEA (ACCESS): CEA (CHCC): 730.44 ng/mL — ABNORMAL HIGH (ref 0.00–5.00)

## 2023-12-24 LAB — PREPARE RBC (CROSSMATCH)

## 2023-12-24 MED ORDER — SODIUM CHLORIDE 0.9 % IV SOLN
80.0000 mg/m2 | Freq: Once | INTRAVENOUS | Status: AC
  Administered 2023-12-24: 12:00:00 150 mg via INTRAVENOUS
  Filled 2023-12-24: qty 25

## 2023-12-24 MED ORDER — DIPHENHYDRAMINE HCL 50 MG/ML IJ SOLN
25.0000 mg | Freq: Once | INTRAMUSCULAR | Status: AC
  Administered 2023-12-24: 09:00:00 25 mg via INTRAVENOUS
  Filled 2023-12-24: qty 1

## 2023-12-24 MED ORDER — SODIUM CHLORIDE 0.9 % IV SOLN: INTRAVENOUS | Status: DC

## 2023-12-24 MED ORDER — FAMOTIDINE IN NACL 20-0.9 MG/50ML-% IV SOLN
20.0000 mg | Freq: Once | INTRAVENOUS | Status: AC
  Administered 2023-12-24: 09:00:00 20 mg via INTRAVENOUS
  Filled 2023-12-24: qty 50

## 2023-12-24 MED ORDER — SODIUM CHLORIDE 0.9 % IV SOLN
8.0000 mg/kg | Freq: Once | INTRAVENOUS | Status: AC
  Administered 2023-12-24: 12:00:00 600 mg via INTRAVENOUS
  Filled 2023-12-24: qty 10

## 2023-12-24 MED ORDER — ACETAMINOPHEN 325 MG PO TABS
650.0000 mg | ORAL_TABLET | Freq: Once | ORAL | Status: AC
  Administered 2023-12-24: 09:00:00 650 mg via ORAL
  Filled 2023-12-24: qty 2

## 2023-12-24 MED ORDER — PALONOSETRON HCL INJECTION 0.25 MG/5ML
0.2500 mg | Freq: Once | INTRAVENOUS | Status: AC
  Administered 2023-12-24: 09:00:00 0.25 mg via INTRAVENOUS
  Filled 2023-12-24: qty 5

## 2023-12-24 MED ORDER — SODIUM CHLORIDE 0.9 % IV SOLN
240.0000 mg | Freq: Once | INTRAVENOUS | Status: AC
  Administered 2023-12-24: 11:00:00 240 mg via INTRAVENOUS
  Filled 2023-12-24: qty 24

## 2023-12-24 NOTE — Progress Notes (Signed)
 Verbal order w/readback for 1 unit of PRBCs to be transfused on 12/26/2023.  Ordered placed and BB is aware of the orders.

## 2023-12-24 NOTE — Patient Instructions (Signed)
 [facility] - A DEPT OF [facility]  Discharge Instructions: Thank you for choosing [facility] to provide your oncology and hematology care.   If you have a lab appointment with the Cancer Center, please go directly to the Cancer Center and check in at the registration area.   Wear comfortable clothing and clothing appropriate for easy access to any Portacath or PICC line.   We strive to give you quality time with your provider. You may need to reschedule your appointment if you arrive late (15 or more minutes).  Arriving late affects you and other patients whose appointments are after yours.  Also, if you miss three or more appointments without notifying the office, you may be dismissed from the clinic at the provider's discretion.      For prescription refill requests, have your pharmacy contact our office and allow 72 hours for refills to be completed.    Today you received the following chemotherapy and/or immunotherapy agents opdivo , cyramza , taxol       To help prevent nausea and vomiting after your treatment, we encourage you to take your nausea medication as directed.  BELOW ARE SYMPTOMS THAT SHOULD BE REPORTED IMMEDIATELY: *FEVER GREATER THAN 100.4 F (38 C) OR HIGHER *CHILLS OR SWEATING *NAUSEA AND VOMITING THAT IS NOT CONTROLLED WITH YOUR NAUSEA MEDICATION *UNUSUAL SHORTNESS OF BREATH *UNUSUAL BRUISING OR BLEEDING *URINARY PROBLEMS (pain or burning when urinating, or frequent urination) *BOWEL PROBLEMS (unusual diarrhea, constipation, pain near the anus) TENDERNESS IN MOUTH AND THROAT WITH OR WITHOUT PRESENCE OF ULCERS (sore throat, sores in mouth, or a toothache) UNUSUAL RASH, SWELLING OR PAIN  UNUSUAL VAGINAL DISCHARGE OR ITCHING   Items with * indicate a potential emergency and should be followed up as soon as possible or go to the Emergency Department if any problems should occur.  Please show the CHEMOTHERAPY ALERT CARD or  IMMUNOTHERAPY ALERT CARD at check-in to the Emergency Department and triage nurse.  Should you have questions after your visit or need to cancel or reschedule your appointment, please contact [facility] - A DEPT OF [facility]  Dept: [phone]  and follow the prompts.  Office hours are 8:00 a.m. to 4:30 p.m. Monday - Friday. Please note that voicemails left after 4:00 p.m. may not be returned until the following business day.  We are closed weekends and major holidays. You have access to a nurse at all times for urgent questions. Please call the main number to the clinic Dept: [phone] and follow the prompts.   For any non-urgent questions, you may also contact your provider using MyChart. We now offer e-Visits for anyone 56 and older to request care online for non-urgent symptoms. For details visit [email or web address].   Also download the MyChart app! Go to the app store, search MyChart, open the app, select [facility], and log in with your MyChart username and password.

## 2023-12-24 NOTE — Progress Notes (Signed)
 [facility]   [phone] [phone]   Clinic Follow up Note   Patient Care Team: Pcp, No as PCP - [name], MD as Consulting Physician (Hematology and Oncology)  Date of Service:  12/24/2023  CHIEF COMPLAINT: f/u of gastric cancer  CURRENT THERAPY:  Paclitaxel  and ramucirumab  every 2 weeks  Oncology History   Gastric cancer (HCC)  cT3N3Mx, with hypermetabolic left supraclavicular node, and indeterminate adrenal nodule, MMR normal ypT3ypN3a, liver and nodes recurrence in 07/2022 -diagnosed 10/18/21 by EGD for 6 month f/u of gastric ulcers, showed mucosal intramucosal adenocarcinoma. MMR normal. -baseline CEA 11/01/21 significantly elevated at 1,989.58. -EUS on 11/06/21 by Dr. Burnette, staged as T3 N3 (at least stage III) -staging CT CAP 11/08/21 showed: stable gastric antrum wall thickening; increased size of upper abdominal lymph nodes; stable left adrenal nodule, 1.9 cm. -PET scan showed FDG avid lymph nodes within the gastrohepatic ligament, portacaval region, and mesentery. Tracer avid left supraclavicular lymph node and indeterminate left adrenal gland nodule with mild FDG uptake. -he underwent [place] node biopsy on 1/17 which was negative for malignant cells, but no lymphoid tissue seen on biopsy  -repeated PET on 02/07/22 after 3 months neoadjuvant chemo showed resolved left supraclavicular lymph node, and significant improvement in regional lymph nodes and the primary tumor, stable and indeterminate left adrenal nodule.  -CT adrenal protocol showed the left adrenal nodule is likely benign adenoma, no biopsy is needed. -Completed neoadjvant chemo FLOT s/p 12 cycles 11/22/21 - 04/27/2022 -restaging PET on 05/16/22 showed significantly hypermetabolic activity at the primary gastric cancer in pylorus, and a small hypermetabolic mesenteric lymph node, no other evidence of metastasis. surgeon Dr. Stevie aware of PET findings -S/p distal  gastrectomy 05/20/22, path showed ypT3N3a with clear margins, 7/18 + LNs, and lymphovascular invasion identified. There was some treatment effect in a few lymph nodes nut not in the primary tumor -due to rising tumor marker, he underwent PET scan on July 07, 2022, which unfortunately showed hypermetabolic new adenopathy in right hilar and mesentery, diffuse liver metastasis, consistent with metastatic recurrence.  I personally reviewed the PET scan images with patient -Unfortunately his disease is not curable at this stage. -The goal of chemotherapy is palliative, to prolong his life -I discussed his next generation sequencing foundation one result, which showed EGFR amplification, no other targetable mutations.  His tumor was previously tested for HER2 which was negative (0-1+).  PD-L1 CPS score was 2%, no significant benefit of immunotherapy.  -He started paclitaxel  and ramucirumab  on 07/25/2022  He developed worsening neuropathy quickly, and I had to switch his treatment to FOLFIRI on 08/29/2022, every 2 weeks, he is overall tolerating well. -restaging CT 01/19/2023 showed disease progression with new pathologic right paratracheal mediastinal lymph nodes as well as increasing size of several liver metastases. -I changed his chemo to taxol , ramucizumab and Nivo on 02/05/2023, every 2 weeks per his request  -restaging CT 04/08/2023 showed partial response in liver and mediastinal nodes  -restaging PET 07/24/2023 showed mixed response comparing to PET in 07/2022 and CT in 04/2023, with significantly improvement in liver mets and slightly progression in nodes and left adrenal glad metastasis -restaging CT 10/12/2023 showed improved liver mets and stable other node mets  -will continue paclitaxel , ramucirumab  and Nivo every 2 weeks  Assessment & Plan Gastric cancer (pyloric antrum) Gastric cancer. No new gastrointestinal symptoms. Tolerating chemo well.  Weight was actually a little bit better this time. Disease  status to be reassessed with  upcoming imaging. -restaging CT scan scheduled for mid-January. - Planned follow-up visit in mid-January after imaging.  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 Nail changes and skin breakdown likely chemotherapy-related. - Recommended continued use of ice for symptomatic relief in hands and feet. - Advised regular trimming of fingernails and monitoring for further changes. - Discussed use of wider, more comfortable footwear to reduce pressure and swelling.  Chronic foot ulcers with secondary infection Chronic foot ulcers with localized secondary infection and skin breakdown, likely exacerbated by neuropathy and prior chemotherapy. Swelling has improved. Overall healing well. Compression socks and footwear may contribute to symptoms. - Advised washing feet and applying topical antibiotic ointment (e.g., Neosporin) to areas of skin breakdown twice daily. - Recommended soaking feet in warm water with vinegar in the evening to reduce bacterial load and support healing. - Advised use of compression socks only when standing or going out, and to remove them at home if uncomfortable. - Recommended obtaining wider, larger shoes to reduce pressure and swelling. - Advised elevating feet when possible.  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 Blood transfusion indicated based on laboratory values and clinical status, pending confirmation of renal and hepatic function. - Planned blood transfusion pending comprehensive metabolic panel results and scheduling. - Coordinated with chart nurse to arrange transfusion timing.  Plan - Patient is clinically stable, skin ulcers/peeling in bottom of feet have overall improved, continue topical antibiotics - Lab reviewed, adequate for treatment, will proceed to chemo  today and continue every 2 weeks - Plan to repeat a CT scan in mid January    SUMMARY OF ONCOLOGIC HISTORY: Oncology History Overview Note   Cancer Staging  Gastric cancer [facility] Staging form: Stomach, AJCC 8th Edition - Clinical stage from 10/18/2021: Stage III (cT3, cN3a, cM0) - Signed by [name], MD on 11/10/2021     Gastric cancer (HCC)  10/18/2021 Procedure   EGD performed under the care of Dr. Dianna  Findings:  -A large, ulcerated, partially circumferential (involving two thirds of the lumen circumference) mass with no bleeding and stigmata of recent bleeding was found in the gastric antrum, at the pylorus and in the prepyloric region of the stomach. -Segmental severe inflammation characterized by congestion (edema) and erythema was found in the gastric antrum.   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 Negative for dysplasia.  Negative for helicobacter pylori  Mismatch Repair (MMR) Protein Imunohistochemistry (IHC): IHC Expression Result: MLH1: Preserved nuclear expression. MSH2: Preserved nuclear expression. MSH6: Preserved nuclear expression. PMS2: Preserved nuclear expression. Interpretation: NORMAL   10/31/2021 Initial Diagnosis   Gastric cancer (HCC)   11/01/2021 Tumor Marker   CEA = 1,989.58 (^)   11/06/2021 Procedure   Upper EUS, Dr. Burnette  Impression: - Normal esophagus. - A small amount of food (residue) in the stomach. - Congested, friable (with contact bleeding) and ulcerated mucosa in the prepyloric region of the stomach. -  Normal examined duodenum. - There was no evidence of significant pathology in the left lobe of the liver. - Many abnormal  lymph nodes (over 10) were visualized in the gastrohepatic ligament (level 18), celiac region (level 20), perigastric region, peripancreatic region and aortocaval region. - Wall thickening was seen in the prepyloric region of the stomach. The thickening appeared to be primarily within the deep mucosa (Layer 2) but extended through the muscularis propria. - Overall constellation of findings consistent with at least T3 N3 Mx (at least stage III) gastric adenocarcinoma.   11/08/2021 Imaging   EXAM: CT CHEST, ABDOMEN, AND PELVIS WITH CONTRAST  IMPRESSION: 1. Similar irregular wall thickening of the gastric antrum, consistent with patient's known primary gastric neoplasm. 2. Increased size of the upper abdominal lymph nodes, concerning for worsening nodal disease involvement. 3. Stable left adrenal nodule common nonspecific possibly a metastatic lesion or adenoma. 4. No evidence of metastatic disease in the chest. 5. Left-sided colonic diverticulosis without findings of acute diverticulitis. 6. Enlarged prostate gland. 7.  Aortic Atherosclerosis (ICD10-I70.0).   11/22/2021 - 04/26/2022 Chemotherapy   Patient is on Treatment Plan : GASTROESOPHAGEAL FLOT q14d X 4 cycles     11/27/2021 Imaging    IMPRESSION: 1. The mass within the gastric antrum is mildly decreased in size when compared with 11/08/2021. There is persistent FDG uptake associated with this mass compatible with residual tumor. 2. Persistent FDG avid lymph nodes within the gastrohepatic ligament, portacaval region, and mesentery. These are mildly decreased in size when compared with 11/08/2021. 3. Tracer avid left supraclavicular lymph node is also mildly decreased in size when compared with 11/08/2021. 4. Indeterminate left adrenal gland nodule with mild FDG uptake. This may represent a lipid poor adenoma.  Metastatic disease not exclude. 5. Diffuse increased uptake throughout the bone marrow is favored to represent treatment related change. 6.  Aortic Atherosclerosis (ICD10-I70.0).     02/07/2022 Imaging    IMPRESSION: 1. No substantial change in hypermetabolism associated with the distal gastric lesion. Although difficult to discern on noncontrast CT imaging, the soft tissue component appears to be decreased since the prior PET-CT. 2. Interval resolution of the hypermetabolic left supraclavicular lymph node. 3. Interval marked decrease of the hypermetabolic gastrohepatic ligament lymphadenopathy. Similar decrease in size and hypermetabolism associated with index nodes identified previously in the 4. porta hepatis and hypogastric region. 5. Stable left adrenal nodule with slight hypermetabolism. Nonspecific finding could represent lipid poor adenoma or metastatic deposit. 6. No new suspicious hypermetabolic disease on today's study. 7.  Aortic Atherosclerosis (ICD10-I70.0).   07/25/2022 - 08/15/2022 Chemotherapy   Patient is on Treatment Plan : GASTROESOPHAGEAL Ramucirumab  D1, 15 + Paclitaxel  D1,8,15 q28d     08/29/2022 - 01/24/2023 Chemotherapy   Patient is on Treatment Plan : COLORECTAL FOLFIRI q14d     02/05/2023 -  Chemotherapy   Patient is on Treatment Plan : GASTROESOPHAGEAL Ramucirumab  D1, 15 + Paclitaxel  D1,8,15 q28d        Discussed the use of AI scribe software for clinical note transcription with the patient, who gave verbal consent to proceed.  History of Present Illness Jonathan Maynard is a 53 year old male with esophageal and gastric cancer who presents for oncology follow-up and management of cancer-related symptoms.  He is undergoing surveillance for esophageal and gastric cancer. He maintains adequate oral intake, weight is stable or slightly improved, and he denies abdominal pain, dysphagia, or constipation.  He has chronic painful ulcerations  and skin breakdown on the plantar surfaces of both feet, worsened by ambulation. Swelling has decreased and prior topical antibiotics improved ambulation. A new ulcer is developing on  the other foot. Ice provides some relief. Compression socks and standard sneakers increase discomfort and swelling, and he prefers wider footwear. Toenail changes are present.  He has mild discomfort in his hands but full function for daily activities. Ice provides symptomatic relief in both hands and feet. He remains physically active at home and occasionally lifts weights.  He has persistent epiphora, frequent clear rhinorrhea, and sneezing, without fever, visual changes, or other pain.     All other systems were reviewed with the patient and are negative.  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    SURGICAL HISTORY: Past Surgical History:  Procedure Laterality Date   BIOPSY  04/12/2021   Procedure: BIOPSY;  Surgeon: Dianna Specking, MD;  Location: [facility];  Service: Gastroenterology;;   ESOPHAGOGASTRODUODENOSCOPY N/A 04/12/2021   Procedure: ESOPHAGOGASTRODUODENOSCOPY (EGD);  Surgeon: Dianna Specking, MD;  Location: [facility];  Service: Gastroenterology;  Laterality: N/A;   ESOPHAGOGASTRODUODENOSCOPY N/A 11/06/2021   Procedure: ESOPHAGOGASTRODUODENOSCOPY (EGD);  Surgeon: Burnette Fallow, MD;  Location: [facility];  Service: Gastroenterology;  Laterality: N/A;   GASTRECTOMY N/A 05/20/2022   Procedure: OPEN PARTIAL GASTRECTOMY WITH GASTROJEJUNAL ANASTOMOSIS;  Surgeon: Stevie, Herlene Righter, MD;  Location: [facility];  Service: General;  Laterality: N/A;   HERNIA REPAIR Left    inguinal   IR IMAGING GUIDED PORT INSERTION   11/15/2021   LAPAROSCOPY N/A 05/20/2022   Procedure: LAPAROSCOPY DIAGNOSTIC;  Surgeon: Stevie Herlene Righter, MD;  Location: [facility];  Service: General;  Laterality: N/A;   UPPER ESOPHAGEAL ENDOSCOPIC ULTRASOUND (EUS) Bilateral 11/06/2021   Procedure: UPPER ESOPHAGEAL ENDOSCOPIC ULTRASOUND (EUS);  Surgeon: Burnette Fallow, MD;  Location: [facility];  Service: Gastroenterology;  Laterality: Bilateral;    I have reviewed the social history and family history with the patient and they are unchanged from previous note.  ALLERGIES:  has no known allergies.  MEDICATIONS:  Current Outpatient Medications  Medication Sig Dispense Refill   acetaminophen  (TYLENOL ) 500 MG tablet Take 1 tablet (500 mg total) by mouth 2 (two) times daily. 60 tablet 3   dexamethasone  (DECADRON ) 4 MG tablet Take 1 tablet (4 mg total) by mouth daily. Start the day after chemotherapy for 1-3 days.  Take with food. 30 tablet 1   diclofenac  Sodium (VOLTAREN  ARTHRITIS PAIN) 1 % GEL Apply 4 g topically 4 (four) times daily. 100 g 3   doxycycline  (VIBRA -TABS) 100 MG tablet Take 1 tablet (100 mg total) by mouth 2 (two) times daily. 20 tablet 0   gabapentin  (NEURONTIN ) 600 MG tablet Take 2 tablets (1,200 mg total) by mouth 3 (three) times daily. 180 tablet 3   magic mouthwash (nystatin , lidocaine , diphenhydrAMINE , alum & mag hydroxide) suspension Swish for 5 seconds and swallow 5 mLs by mouth 3 (three) times daily as needed for mouth pain. 140 mL 0   ondansetron  (ZOFRAN -ODT) 4 MG disintegrating tablet Dissolve 1 tablet (4 mg total) by mouth every 6 (six) hours as needed for nausea or vomiting. 30 tablet 1   pantoprazole  (PROTONIX ) 40 MG tablet Take 1 tablet (40 mg) by mouth 2 times daily. 60 tablet 6   prochlorperazine  (COMPAZINE ) 10 MG tablet Take 1 tablet (10 mg total) by mouth every 6 (  six) hours as needed for nausea or vomiting. 30 tablet 2   No current facility-administered medications for this visit.   Facility-Administered  Medications Ordered in Other Visits  Medication Dose Route Frequency Provider Last Rate Last Admin   0.9 %  sodium chloride  infusion   Intravenous Continuous [name], MD   Stopped at 12/24/23 1332    PHYSICAL EXAMINATION: ECOG PERFORMANCE STATUS: 1 - Symptomatic but completely ambulatory  There were no vitals filed for this visit. Wt Readings from Last 3 Encounters:  12/24/23 179 lb 8 oz (81.4 kg)  12/10/23 175 lb 12.8 oz (79.7 kg)  12/10/23 175 lb (79.4 kg)     GENERAL:alert, no distress and comfortable SKIN: skin color, texture, turgor are normal, no rashes or significant lesions, except a few areas of skin peeling/healing ulcer on bottom of feet EYES: normal, Conjunctiva are pink and non-injected, sclera clear NECK: supple, thyroid  normal size, non-tender, without nodularity LYMPH:  no palpable lymphadenopathy in the cervical, axillary  LUNGS: clear to auscultation and percussion with normal breathing effort HEART: regular rate & rhythm and no murmurs and no lower extremity edema ABDOMEN:abdomen soft, non-tender and normal bowel sounds Musculoskeletal:no cyanosis of digits and no clubbing  NEURO: alert & oriented x 3 with fluent speech, no focal motor/sensory deficits  Physical Exam SKIN: Inflamed sores on the plantar surfaces of the feet.  LABORATORY DATA:  I have reviewed the data as listed    Latest Ref Rng & Units 12/24/2023    7:45 AM 12/10/2023    7:58 AM 11/26/2023    7:37 AM  CBC  WBC 4.0 - 10.5 K/uL 9.3  10.6  7.6   Hemoglobin 13.0 - 17.0 g/dL 7.5  8.3  8.8   Hematocrit 39.0 - 52.0 % 24.7  26.4  28.2   Platelets 150 - 400 K/uL 121  119  145         Latest Ref Rng & Units 12/24/2023    7:45 AM 12/10/2023    7:58 AM 11/26/2023    7:37 AM  CMP  Glucose 70 - 99 mg/dL 83  87  897   BUN 6 - 20 mg/dL 13  19  14    Creatinine 0.61 - 1.24 mg/dL 9.10  9.12  9.15   Sodium 135 - 145 mmol/L 138  137  139   Potassium 3.5 - 5.1 mmol/L 4.5  4.2  4.7   Chloride 98 -  111 mmol/L 104  105  104   CO2 22 - 32 mmol/L 25  23  27    Calcium  8.9 - 10.3 mg/dL 9.1  8.9  9.0   Total Protein 6.5 - 8.1 g/dL 6.5  6.8  6.5   Total Bilirubin 0.0 - 1.2 mg/dL 0.4  0.5  0.5   Alkaline Phos 38 - 126 U/L 79  72  72   AST 15 - 41 U/L 26  24  23    ALT 0 - 44 U/L 14  13  12        RADIOGRAPHIC STUDIES: I have personally reviewed the radiological images as listed and agreed with the findings in the report. No results found.    Orders Placed This Encounter  Procedures   CEA (Access)    Standing Status:   Standing    Number of Occurrences:   30    Expiration Date:   12/22/2024   All questions were answered. The patient knows to call the clinic with any problems, questions or concerns. No barriers  to learning was detected. The total time spent in the appointment was 30 minutes, including review of chart and various tests results, discussions about plan of care and coordination of care plan     Onita Mattock, MD 12/24/2023

## 2023-12-26 ENCOUNTER — Inpatient Hospital Stay

## 2023-12-26 DIAGNOSIS — Z5112 Encounter for antineoplastic immunotherapy: Secondary | ICD-10-CM | POA: Diagnosis not present

## 2023-12-26 DIAGNOSIS — D649 Anemia, unspecified: Secondary | ICD-10-CM

## 2023-12-26 DIAGNOSIS — C163 Malignant neoplasm of pyloric antrum: Secondary | ICD-10-CM

## 2023-12-26 MED ORDER — SODIUM CHLORIDE 0.9% IV SOLUTION
250.0000 mL | INTRAVENOUS | Status: DC
  Administered 2023-12-26: 100 mL via INTRAVENOUS

## 2023-12-26 NOTE — Patient Instructions (Signed)

## 2023-12-28 LAB — TYPE AND SCREEN
ABO/RH(D): O POS
Antibody Screen: NEGATIVE
Unit division: 0

## 2023-12-28 LAB — BPAM RBC
Blood Product Expiration Date: 202601152359
ISSUE DATE / TIME: 202512201027
Unit Type and Rh: 202601152359
Unit Type and Rh: 5100

## 2024-01-04 ENCOUNTER — Other Ambulatory Visit: Payer: Self-pay | Admitting: Nurse Practitioner

## 2024-01-04 ENCOUNTER — Telehealth: Payer: Self-pay

## 2024-01-04 ENCOUNTER — Other Ambulatory Visit (HOSPITAL_COMMUNITY): Payer: Self-pay

## 2024-01-04 MED ORDER — GABAPENTIN 600 MG PO TABS: 1200.0000 mg | ORAL_TABLET | Freq: Three times a day (TID) | ORAL | 3 refills | Status: AC

## 2024-01-04 NOTE — Telephone Encounter (Signed)
 Pt called requesting a refill on his Gabapentin .  Notified Dr Demetra APPs of the pt's request.

## 2024-01-07 ENCOUNTER — Encounter: Payer: Self-pay | Admitting: Physician Assistant

## 2024-01-07 ENCOUNTER — Encounter: Payer: Self-pay | Admitting: Hematology

## 2024-01-07 ENCOUNTER — Other Ambulatory Visit: Payer: Self-pay | Admitting: Nurse Practitioner

## 2024-01-07 DIAGNOSIS — D649 Anemia, unspecified: Secondary | ICD-10-CM

## 2024-01-07 DIAGNOSIS — C163 Malignant neoplasm of pyloric antrum: Secondary | ICD-10-CM

## 2024-01-07 NOTE — Assessment & Plan Note (Addendum)
" °  cT3N3Mx, with hypermetabolic left supraclavicular node, and indeterminate adrenal nodule, MMR normal ypT3ypN3a, liver and nodes recurrence in 07/2022 -diagnosed 10/18/21 by EGD for 6 month f/u of gastric ulcers, showed mucosal intramucosal adenocarcinoma. MMR normal. -baseline CEA 11/01/21 significantly elevated at 1,989.58. -EUS on 11/06/21 by Dr. Burnette, staged as T3 N3 (at least stage III) -staging CT CAP 11/08/21 showed: stable gastric antrum wall thickening; increased size of upper abdominal lymph nodes; stable left adrenal nodule, 1.9 cm. -PET scan showed FDG avid lymph nodes within the gastrohepatic ligament, portacaval region, and mesentery. Tracer avid left supraclavicular lymph node and indeterminate left adrenal gland nodule with mild FDG uptake. -he underwent [place] node biopsy on 1/17 which was negative for malignant cells, but no lymphoid tissue seen on biopsy  -repeated PET on 02/07/22 after 3 months neoadjuvant chemo showed resolved left supraclavicular lymph node, and significant improvement in regional lymph nodes and the primary tumor, stable and indeterminate left adrenal nodule.  -CT adrenal protocol showed the left adrenal nodule is likely benign adenoma, no biopsy is needed. -Completed neoadjvant chemo FLOT s/p 12 cycles 11/22/21 - 04/27/2022 -restaging PET on 05/16/22 showed significantly hypermetabolic activity at the primary gastric cancer in pylorus, and a small hypermetabolic mesenteric lymph node, no other evidence of metastasis. surgeon Dr. Stevie aware of PET findings -S/p distal gastrectomy 05/20/22, path showed ypT3N3a with clear margins, 7/18 + LNs, and lymphovascular invasion identified. There was some treatment effect in a few lymph nodes nut not in the primary tumor -due to rising tumor marker, he underwent PET scan on July 07, 2022, which unfortunately showed hypermetabolic new adenopathy in right hilar and mesentery, diffuse liver metastasis, consistent with metastatic  recurrence.  I personally reviewed the PET scan images with patient -Unfortunately his disease is not curable at this stage. -The goal of chemotherapy is palliative, to prolong his life -I discussed his next generation sequencing foundation one result, which showed EGFR amplification, no other targetable mutations.  His tumor was previously tested for HER2 which was negative (0-1+).  PD-L1 CPS score was 2%, no significant benefit of immunotherapy.  -He started paclitaxel  and ramucirumab  on 07/25/2022  He developed worsening neuropathy quickly, and I had to switch his treatment to FOLFIRI on 08/29/2022, every 2 weeks, he is overall tolerating well. -restaging CT 01/19/2023 showed disease progression with new pathologic right paratracheal mediastinal lymph nodes as well as increasing size of several liver metastases. -I changed his chemo to taxol , ramucizumab and Nivo on 02/05/2023, every 2 weeks per his request  -restaging CT 04/08/2023 showed partial response in liver and mediastinal nodes  -restaging PET 07/24/2023 showed mixed response comparing to PET in 07/2022 and CT in 04/2023, with significantly improvement in liver mets and slightly progression in nodes and left adrenal glad metastasis -restaging CT 10/12/2023 showed improved liver mets and stable other node mets  -will continue paclitaxel , ramucirumab  and Nivo every 2 weeks - 01/08/2024 -patient tolerating treatment well overall.  Will proceed with cycle 13 day 1 treatment with paclitaxel , Cyramza , and nivolumab .  Will receive 1 unit packed RBCs during treatment today.  Restaging CT CAP scheduled for 01/15/2024.   "

## 2024-01-07 NOTE — Progress Notes (Signed)
 " Patient Care Team: Pcp, No as PCP - General [name], MD as Consulting Physician (Hematology and Oncology)  Clinic Day:  01/08/2024  Referring physician: [name], MD  ASSESSMENT & PLAN:   Assessment & Plan: Gastric cancer [facility])  cT3N3Mx, with hypermetabolic left supraclavicular node, and indeterminate adrenal nodule, MMR normal ypT3ypN3a, liver and nodes recurrence in 07/2022 -diagnosed 10/18/21 by EGD for 6 month f/u of gastric ulcers, showed mucosal intramucosal adenocarcinoma. MMR normal. -baseline CEA 11/01/21 significantly elevated at 1,989.58. -EUS on 11/06/21 by Dr. Burnette, staged as T3 N3 (at least stage III) -staging CT CAP 11/08/21 showed: stable gastric antrum wall thickening; increased size of upper abdominal lymph nodes; stable left adrenal nodule, 1.9 cm. -PET scan showed FDG avid lymph nodes within the gastrohepatic ligament, portacaval region, and mesentery. Tracer avid left supraclavicular lymph node and indeterminate left adrenal gland nodule with mild FDG uptake. -he underwent [place] node biopsy on 1/17 which was negative for malignant cells, but no lymphoid tissue seen on biopsy  -repeated PET on 02/07/22 after 3 months neoadjuvant chemo showed resolved left supraclavicular lymph node, and significant improvement in regional lymph nodes and the primary tumor, stable and indeterminate left adrenal nodule.  -CT adrenal protocol showed the left adrenal nodule is likely benign adenoma, no biopsy is needed. -Completed neoadjvant chemo FLOT s/p 12 cycles 11/22/21 - 04/27/2022 -restaging PET on 05/16/22 showed significantly hypermetabolic activity at the primary gastric cancer in pylorus, and a small hypermetabolic mesenteric lymph node, no other evidence of metastasis. surgeon Dr. Stevie aware of PET findings -S/p distal gastrectomy 05/20/22, path showed ypT3N3a with clear margins, 7/18 + LNs, and lymphovascular invasion identified. There was some treatment effect in a few lymph  nodes nut not in the primary tumor -due to rising tumor marker, he underwent PET scan on July 07, 2022, which unfortunately showed hypermetabolic new adenopathy in right hilar and mesentery, diffuse liver metastasis, consistent with metastatic recurrence.  I personally reviewed the PET scan images with patient -Unfortunately his disease is not curable at this stage. -The goal of chemotherapy is palliative, to prolong his life -I discussed his next generation sequencing foundation one result, which showed EGFR amplification, no other targetable mutations.  His tumor was previously tested for HER2 which was negative (0-1+).  PD-L1 CPS score was 2%, no significant benefit of immunotherapy.  -He started paclitaxel  and ramucirumab  on 07/25/2022  He developed worsening neuropathy quickly, and I had to switch his treatment to FOLFIRI on 08/29/2022, every 2 weeks, he is overall tolerating well. -restaging CT 01/19/2023 showed disease progression with new pathologic right paratracheal mediastinal lymph nodes as well as increasing size of several liver metastases. -I changed his chemo to taxol , ramucizumab and Nivo on 02/05/2023, every 2 weeks per his request  -restaging CT 04/08/2023 showed partial response in liver and mediastinal nodes  -restaging PET 07/24/2023 showed mixed response comparing to PET in 07/2022 and CT in 04/2023, with significantly improvement in liver mets and slightly progression in nodes and left adrenal glad metastasis -restaging CT 10/12/2023 showed improved liver mets and stable other node mets  -will continue paclitaxel , ramucirumab  and Nivo every 2 weeks - 01/08/2024 -patient tolerating treatment well overall.  Will proceed with cycle 13 day 1 treatment with paclitaxel , Cyramza , and nivolumab .  Will receive 1 unit packed RBCs during treatment today.  Restaging CT CAP scheduled for 01/15/2024.    Nosebleeds Patient reporting more frequent and severe nosebleeds.  Has been using Neosporin on nasal  mucus membranes.  Recommend use of saline nasal spray several times daily to keep the passages moist.  Advised him to avoid aggressive nose blowing to prevent disruption of the scabs and vessel healing.  Hand-and-foot syndrome Improved blistering on both feet.  Has finished oral antibiotics.  Will add Clindagel which may be applied 1 to 2 times daily to continue healing.  Peripheral neuropathy Will continue gabapentin  at current dose.  No changes.  Will adjust any future as indicated.  Anemia Moderate but stable anemia with Hgb 7 Hgb 7.4 and HCT 24.2.  Patient to receive 1 unit of RBCs during chemotherapy/immunotherapy today.  Will continue to monitor closely, prior to every treatment.  Administer blood/iron  as indicated.  Plan Labs reviewed. -.  Moderate anemia.  Patient to receive 1 unit packed RBCs during treatment today. - Unremarkable CMP. Scheduled for restaging CT CAP on 01/15/2024. Labs and patient presentation are appropriate for treatment today. Proceed with cycle 13 day 1 chemotherapy paclitaxel , Cyramza , and nivolumab . Labs/flush, follow-up, and subsequent treatment as scheduled.   The patient understands the plans discussed today and is in agreement with them.  He knows to contact our office if he develops concerns prior to his next appointment.  I provided 25 minutes of face-to-face time during this encounter and > 50% was spent counseling as documented under my assessment and plan.    Powell FORBES Lessen, NP   CANCER CENTER [facility] - A DEPT OF [facility] [address] [place] [address] Dept: [phone] Dept Fax: [phone]   No orders of the defined types were placed in this encounter.     CHIEF COMPLAINT:  CC: Gastric cancer  Current Treatment: Paclitaxel  and ramucirumab  every 2 weeks  INTERVAL HISTORY:  Jacub is here today for repeat clinical assessment.  Patient last saw Dr. Lanny on  12/24/2023.  Reports more frequent nosebleeds.  Has persistent and baseline peripheral neuropathy.  Feels like her feet are getting very hot.  Gabapentin  not working as well as it used to.  Sometimes has to stand for a few minutes before he can walk.  Toes are cramping.  Does have some blisters on his feet which are healing.  Discussed topical antibiotics at last visit.  Does not have these.  Did take a course of oral antibiotics previously.  Using his wife's moisturizing oil on his feet.  Appear much better than previously.  Denies new symptoms or concerns.  He denies chest pain, chest pressure, or shortness of breath. He denies headaches or visual disturbances. He denies abdominal pain, nausea, vomiting, or changes in bowel or bladder habits.  He denies fevers or chills. He denies pain. His appetite is good. His weight has increased 9 pounds over last 2 weeks.  I have reviewed the past medical history, past surgical history, social history and family history with the patient and they are unchanged from previous note.  ALLERGIES:  has no known allergies.  MEDICATIONS:  Current Outpatient Medications  Medication Sig Dispense Refill   acetaminophen  (TYLENOL ) 500 MG tablet Take 1 tablet (500 mg total) by mouth 2 (two) times daily. 60 tablet 3   clindamycin  (CLINDAGEL) 1 % gel Apply topically 2 (two) times daily. 60 g 0   dexamethasone  (DECADRON ) 4 MG tablet Take 1 tablet (4 mg total) by mouth daily. Start the day after chemotherapy for 1-3 days.  Take with food. 30 tablet 1   diclofenac  Sodium (VOLTAREN  ARTHRITIS PAIN) 1 % GEL Apply  4 g topically 4 (four) times daily. 100 g 3   doxycycline  (VIBRA -TABS) 100 MG tablet Take 1 tablet (100 mg total) by mouth 2 (two) times daily. 20 tablet 0   gabapentin  (NEURONTIN ) 600 MG tablet Take 2 tablets (1,200 mg total) by mouth 3 (three) times daily. 180 tablet 3   magic mouthwash (nystatin , lidocaine , diphenhydrAMINE , alum & mag hydroxide) suspension Swish for 5  seconds and swallow 5 mLs by mouth 3 (three) times daily as needed for mouth pain. 140 mL 0   ondansetron  (ZOFRAN -ODT) 4 MG disintegrating tablet Dissolve 1 tablet (4 mg total) by mouth every 6 (six) hours as needed for nausea or vomiting. 30 tablet 1   pantoprazole  (PROTONIX ) 40 MG tablet Take 1 tablet by mouth 2 (two) times daily.     prochlorperazine  (COMPAZINE ) 10 MG tablet Take 1 tablet (10 mg total) by mouth every 6 (six) hours as needed for nausea or vomiting. 30 tablet 2   pantoprazole  (PROTONIX ) 40 MG tablet Take 1 tablet (40 mg) by mouth 2 times daily. 180 tablet 1   No current facility-administered medications for this visit.    HISTORY OF PRESENT ILLNESS:   Oncology History Overview Note   Cancer Staging  Gastric cancer [facility]) Staging form: Stomach, AJCC 8th Edition - Clinical stage from 10/18/2021: Stage III (cT3, cN3a, cM0) - Signed by [name], MD on 11/10/2021     Gastric cancer (HCC)  10/18/2021 Procedure   EGD performed under the care of Dr. Dianna  Findings:  -A large, ulcerated, partially circumferential (involving two thirds of the lumen circumference) mass with no bleeding and stigmata of recent bleeding was found in the gastric antrum, at the pylorus and in the prepyloric region of the stomach. -Segmental severe inflammation characterized by congestion (edema) and erythema was found in the gastric antrum.   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 Negative for dysplasia.  Negative for helicobacter pylori  Mismatch Repair (MMR) Protein  Imunohistochemistry (IHC): IHC Expression Result: MLH1: Preserved nuclear expression. MSH2: Preserved nuclear expression. MSH6: Preserved nuclear expression. PMS2: Preserved nuclear expression. Interpretation: NORMAL   10/31/2021 Initial Diagnosis   Gastric cancer (HCC)   11/01/2021 Tumor Marker   CEA = 1,989.58 (^)   11/06/2021 Procedure   Upper EUS, Dr. Burnette  Impression: - Normal esophagus. - A small amount of food (residue) in the stomach. - Congested, friable (with contact bleeding) and ulcerated mucosa in the prepyloric region of the stomach. - Normal examined duodenum. - There was no evidence of significant pathology in the left lobe of the liver. - Many abnormal lymph nodes (over 10) were visualized in the gastrohepatic ligament (level 18), celiac region (level 20), perigastric region, peripancreatic region and aortocaval region. - Wall thickening was seen in the prepyloric region of the stomach. The thickening appeared to be primarily within the deep mucosa (Layer 2) but extended through the muscularis propria. - Overall constellation of findings consistent with at least T3 N3 Mx (at least stage III) gastric adenocarcinoma.   11/08/2021 Imaging   EXAM: CT CHEST, ABDOMEN, AND PELVIS WITH CONTRAST  IMPRESSION: 1. Similar irregular wall thickening of the  gastric antrum, consistent with patient's known primary gastric neoplasm. 2. Increased size of the upper abdominal lymph nodes, concerning for worsening nodal disease involvement. 3. Stable left adrenal nodule common nonspecific possibly a metastatic lesion or adenoma. 4. No evidence of metastatic disease in the chest. 5. Left-sided colonic diverticulosis without findings of acute diverticulitis. 6. Enlarged prostate gland. 7.  Aortic Atherosclerosis (ICD10-I70.0).   11/22/2021 - 04/26/2022 Chemotherapy   Patient is on Treatment Plan : GASTROESOPHAGEAL FLOT q14d X 4 cycles     11/27/2021 Imaging    IMPRESSION: 1.  The mass within the gastric antrum is mildly decreased in size when compared with 11/08/2021. There is persistent FDG uptake associated with this mass compatible with residual tumor. 2. Persistent FDG avid lymph nodes within the gastrohepatic ligament, portacaval region, and mesentery. These are mildly decreased in size when compared with 11/08/2021. 3. Tracer avid left supraclavicular lymph node is also mildly decreased in size when compared with 11/08/2021. 4. Indeterminate left adrenal gland nodule with mild FDG uptake. This may represent a lipid poor adenoma. Metastatic disease not exclude. 5. Diffuse increased uptake throughout the bone marrow is favored to represent treatment related change. 6.  Aortic Atherosclerosis (ICD10-I70.0).     02/07/2022 Imaging    IMPRESSION: 1. No substantial change in hypermetabolism associated with the distal gastric lesion. Although difficult to discern on noncontrast CT imaging, the soft tissue component appears to be decreased since the prior PET-CT. 2. Interval resolution of the hypermetabolic left supraclavicular lymph node. 3. Interval marked decrease of the hypermetabolic gastrohepatic ligament lymphadenopathy. Similar decrease in size and hypermetabolism associated with index nodes identified previously in the 4. porta hepatis and hypogastric region. 5. Stable left adrenal nodule with slight hypermetabolism. Nonspecific finding could represent lipid poor adenoma or metastatic deposit. 6. No new suspicious hypermetabolic disease on today's study. 7.  Aortic Atherosclerosis (ICD10-I70.0).   07/25/2022 - 08/15/2022 Chemotherapy   Patient is on Treatment Plan : GASTROESOPHAGEAL Ramucirumab  D1, 15 + Paclitaxel  D1,8,15 q28d     08/29/2022 - 01/24/2023 Chemotherapy   Patient is on Treatment Plan : COLORECTAL FOLFIRI q14d     02/05/2023 -  Chemotherapy   Patient is on Treatment Plan : GASTROESOPHAGEAL Ramucirumab  D1, 15 + Paclitaxel  D1,8,15 q28d          REVIEW OF SYSTEMS:   Constitutional: Denies fevers, chills or abnormal weight loss Eyes: Denies blurriness of vision Ears, nose, mouth, throat, and face: Denies mucositis or sore throat. Having more frequent nosebleeds.  Respiratory: Denies cough, dyspnea or wheezes Cardiovascular: Denies palpitation, chest discomfort or lower extremity swelling Gastrointestinal:  Denies nausea, heartburn or change in bowel habits Skin: Denies abnormal skin rashes.  Improved blistering on bilateral feet.  Less tender. Lymphatics: Denies new lymphadenopathy or easy bruising Neurological:Denies numbness, tingling or new weaknesses Behavioral/Psych: Mood is stable, no new changes  All other systems were reviewed with the patient and are negative.   VITALS:   Today's Vitals   01/08/24 0900 01/08/24 0936  BP:  136/77  Pulse:  100  Resp:  16  Temp:  98.7 F (37.1 C)  SpO2:  100%  Weight:  188 lb (85.3 kg)  Height:  5' 6 (1.676 m)  PainSc: 4     Body mass index is 30.34 kg/m.   Wt Readings from Last 3 Encounters:  01/21/24 188 lb 12.8 oz (85.6 kg)  01/08/24 188 lb (85.3 kg)  12/24/23 179 lb 8 oz (81.4 kg)    Body mass index is 30.34  kg/m.  Performance status (ECOG): 2 - Symptomatic, <50% confined to bed  PHYSICAL EXAM:   GENERAL:alert, no distress and comfortable SKIN: skin color, texture, turgor are normal, no rashes or significant lesions.  Evidence of well-healing blisters on the bottoms of both feet.  No redness, no tenderness, skin intact with new skin growth present. EYES: normal, Conjunctiva are pink and non-injected, sclera clear OROPHARYNX:no exudate, no erythema and lips, buccal mucosa, and tongue normal  NECK: supple, thyroid  normal size, non-tender, without nodularity LYMPH:  no palpable lymphadenopathy in the cervical, axillary or inguinal LUNGS: clear to auscultation and percussion with normal breathing effort HEART: regular rate & rhythm and no murmurs and no  lower extremity edema ABDOMEN:abdomen soft, non-tender and normal bowel sounds Musculoskeletal:no cyanosis of digits and no clubbing  NEURO: alert & oriented x 3 with fluent speech, no focal motor/sensory deficits  LABORATORY DATA:  I have reviewed the data as listed    Component Value Date/Time   NA 140 01/21/2024 0845   K 4.3 01/21/2024 0845   CL 106 01/21/2024 0845   CO2 25 01/21/2024 0845   GLUCOSE 91 01/21/2024 0845   BUN 12 01/21/2024 0845   CREATININE 0.83 01/21/2024 0845   CALCIUM  8.6 (L) 01/21/2024 0845   PROT 6.0 (L) 01/21/2024 0845   ALBUMIN  3.3 (L) 01/21/2024 0845   AST 26 01/21/2024 0845   ALT 14 01/21/2024 0845   ALKPHOS 76 01/21/2024 0845   BILITOT 0.5 01/21/2024 0845   GFRNONAA >60 01/21/2024 0845     Lab Results  Component Value Date   WBC 6.6 01/21/2024   NEUTROABS 3.9 01/21/2024   HGB 8.1 (L) 01/21/2024   HCT 26.2 (L) 01/21/2024   MCV 90.7 01/21/2024   PLT 144 (L) 01/21/2024     RADIOGRAPHIC STUDIES: CT CHEST ABDOMEN PELVIS W CONTRAST Result Date: 01/21/2024 EXAM: CT CHEST, ABDOMEN AND PELVIS WITH CONTRAST 01/15/2024 07:52:47 AM TECHNIQUE: CT of the chest, abdomen and pelvis was performed with the administration of 100 mL of iohexol  (OMNIPAQUE ) 300 MG/ML solution. Multiplanar reformatted images are provided for review. Automated exposure control, iterative reconstruction, and/or weight based adjustment of the mA/kV was utilized to reduce the radiation dose to as low as reasonably achievable. COMPARISON: CT of the chest, abdomen and pelvis dated 10/12/2023. CLINICAL HISTORY: Metastatic disease evaluation. FINDINGS: CHEST: MEDIASTINUM AND LYMPH NODES: Heart and pericardium are unremarkable. The central airways are clear. Mild mediastinal and bilateral hilar lymphadenopathy again demonstrated. A few new shotty superior mediastinal and aortopulmonary window lymph nodes present. For instance, there is a new left superior mediastinal node seen on image 21 of  series 2 measuring approximately 6 mm in short axis. There are also a few new or enlarging nodes in the aortopulmonary window on images 29 to 31. The previously measured lymph nodes have remained stable in the interim. Mild calcific coronary artery disease. Right internal jugular chest port. LUNGS AND PLEURA: The lungs remain clear. No pleural effusion. No pneumothorax. ABDOMEN AND PELVIS: LIVER: Numerous metastatic lesions again demonstrated throughout the liver, which are unchanged in the interim. GALLBLADDER AND BILE DUCTS: Unremarkable. No biliary ductal dilatation. SPLEEN: No acute abnormality. PANCREAS: No acute abnormality. ADRENAL GLANDS: A left adrenal nodule is unchanged in the interim, measuring approximately 14 mm. KIDNEYS, URETERS AND BLADDER: No stones in the kidneys or ureters. No hydronephrosis. No perinephric or periureteral stranding. Urinary bladder is unremarkable. GI AND BOWEL: Postsurgical changes are noted in the distal stomach. A small periumbilical hernia which contains a short  segment of unobstructed small bowel. There is no bowel obstruction. REPRODUCTIVE ORGANS: The prostate gland is mildly prominent. PERITONEUM AND RETROPERITONEUM: No ascites. No free air. VASCULATURE: Aorta is normal in caliber. ABDOMINAL AND PELVIS LYMPH NODES: Several prominent gastrohepatic lymph nodes, also unchanged from the interim. BONES AND SOFT TISSUES: No acute osseous abnormality. No focal soft tissue abnormality. IMPRESSION: 1. Numerous hepatic metastatic lesions, unchanged in the interim. 2. Mild mediastinal and bilateral hilar lymphadenopathy, with a few new/enlarging subcentimeter superior mediastinal and aortopulmonary window lymph nodes. Electronically signed by: Evalene Coho MD 01/21/2024 08:15 AM EST RP Workstation: [id number]   "

## 2024-01-08 ENCOUNTER — Inpatient Hospital Stay: Attending: Physician Assistant | Admitting: Nurse Practitioner

## 2024-01-08 ENCOUNTER — Inpatient Hospital Stay

## 2024-01-08 ENCOUNTER — Other Ambulatory Visit (HOSPITAL_COMMUNITY): Payer: Self-pay

## 2024-01-08 ENCOUNTER — Other Ambulatory Visit: Payer: Self-pay

## 2024-01-08 VITALS — BP 136/77 | HR 100 | Temp 98.7°F | Resp 16 | Ht 66.0 in | Wt 188.0 lb

## 2024-01-08 VITALS — BP 124/80 | HR 62 | Temp 98.3°F | Resp 16

## 2024-01-08 DIAGNOSIS — C163 Malignant neoplasm of pyloric antrum: Secondary | ICD-10-CM | POA: Diagnosis not present

## 2024-01-08 DIAGNOSIS — G620 Drug-induced polyneuropathy: Secondary | ICD-10-CM | POA: Diagnosis not present

## 2024-01-08 DIAGNOSIS — D6481 Anemia due to antineoplastic chemotherapy: Secondary | ICD-10-CM | POA: Insufficient documentation

## 2024-01-08 DIAGNOSIS — Z5112 Encounter for antineoplastic immunotherapy: Secondary | ICD-10-CM | POA: Insufficient documentation

## 2024-01-08 DIAGNOSIS — Z79899 Other long term (current) drug therapy: Secondary | ICD-10-CM | POA: Insufficient documentation

## 2024-01-08 DIAGNOSIS — D649 Anemia, unspecified: Secondary | ICD-10-CM

## 2024-01-08 DIAGNOSIS — Z7952 Long term (current) use of systemic steroids: Secondary | ICD-10-CM | POA: Insufficient documentation

## 2024-01-08 DIAGNOSIS — T451X5D Adverse effect of antineoplastic and immunosuppressive drugs, subsequent encounter: Secondary | ICD-10-CM | POA: Insufficient documentation

## 2024-01-08 DIAGNOSIS — E278 Other specified disorders of adrenal gland: Secondary | ICD-10-CM | POA: Diagnosis not present

## 2024-01-08 DIAGNOSIS — R040 Epistaxis: Secondary | ICD-10-CM | POA: Diagnosis not present

## 2024-01-08 DIAGNOSIS — C787 Secondary malignant neoplasm of liver and intrahepatic bile duct: Secondary | ICD-10-CM | POA: Insufficient documentation

## 2024-01-08 DIAGNOSIS — Z5111 Encounter for antineoplastic chemotherapy: Secondary | ICD-10-CM | POA: Insufficient documentation

## 2024-01-08 DIAGNOSIS — Z79620 Long term (current) use of immunosuppressive biologic: Secondary | ICD-10-CM | POA: Diagnosis not present

## 2024-01-08 LAB — CMP (CANCER CENTER ONLY)
ALT: 12 U/L (ref 0–44)
AST: 23 U/L (ref 15–41)
Albumin: 3.6 g/dL (ref 3.5–5.0)
Alkaline Phosphatase: 68 U/L (ref 38–126)
Anion gap: 7 (ref 5–15)
BUN: 11 mg/dL (ref 6–20)
CO2: 24 mmol/L (ref 22–32)
Calcium: 8.4 mg/dL — ABNORMAL LOW (ref 8.9–10.3)
Chloride: 110 mmol/L (ref 98–111)
Creatinine: 0.98 mg/dL (ref 0.61–1.24)
GFR, Estimated: >60 mL/min
Glucose, Bld: 102 mg/dL — ABNORMAL HIGH (ref 70–99)
Potassium: 4.1 mmol/L (ref 3.5–5.1)
Sodium: 141 mmol/L (ref 135–145)
Total Bilirubin: 0.4 mg/dL (ref 0.0–1.2)
Total Protein: 5.9 g/dL — ABNORMAL LOW (ref 6.5–8.1)

## 2024-01-08 LAB — CBC WITH DIFFERENTIAL (CANCER CENTER ONLY)
Abs Immature Granulocytes: 0.07 K/uL (ref 0.00–0.07)
Basophils Absolute: 0 K/uL (ref 0.0–0.1)
Basophils Relative: 1 %
Eosinophils Absolute: 0.1 K/uL (ref 0.0–0.5)
Eosinophils Relative: 1 %
HCT: 24.2 % — ABNORMAL LOW (ref 39.0–52.0)
Hemoglobin: 7.4 g/dL — ABNORMAL LOW (ref 13.0–17.0)
Immature Granulocytes: 1 %
Lymphocytes Relative: 26 %
Lymphs Abs: 2 K/uL (ref 0.7–4.0)
MCH: 28.6 pg (ref 26.0–34.0)
MCHC: 30.6 g/dL (ref 30.0–36.0)
MCV: 93.4 fL (ref 80.0–100.0)
Monocytes Absolute: 1.3 K/uL — ABNORMAL HIGH (ref 0.1–1.0)
Monocytes Relative: 17 %
Neutro Abs: 4 K/uL (ref 1.7–7.7)
Neutrophils Relative %: 54 %
Platelet Count: 157 K/uL (ref 150–400)
RBC: 2.59 MIL/uL — ABNORMAL LOW (ref 4.22–5.81)
RDW: 26.3 % — ABNORMAL HIGH (ref 11.5–15.5)
WBC Count: 7.5 K/uL (ref 4.0–10.5)
nRBC: 0.8 % — ABNORMAL HIGH (ref 0.0–0.2)

## 2024-01-08 LAB — SAMPLE TO BLOOD BANK

## 2024-01-08 LAB — TOTAL PROTEIN, URINE DIPSTICK: Protein, ur: 100 mg/dL — AB

## 2024-01-08 LAB — PREPARE RBC (CROSSMATCH)

## 2024-01-08 LAB — TSH: TSH: 1.78 u[IU]/mL (ref 0.350–4.500)

## 2024-01-08 LAB — FERRITIN: Ferritin: 45 ng/mL (ref 24–336)

## 2024-01-08 MED ORDER — ACETAMINOPHEN 325 MG PO TABS
650.0000 mg | ORAL_TABLET | Freq: Once | ORAL | Status: AC
  Administered 2024-01-08: 650 mg via ORAL
  Filled 2024-01-08: qty 2

## 2024-01-08 MED ORDER — SODIUM CHLORIDE 0.9 % IV SOLN
700.0000 mg | Freq: Once | INTRAVENOUS | Status: AC
  Administered 2024-01-08: 700 mg via INTRAVENOUS
  Filled 2024-01-08: qty 20

## 2024-01-08 MED ORDER — PALONOSETRON HCL INJECTION 0.25 MG/5ML
0.2500 mg | Freq: Once | INTRAVENOUS | Status: AC
  Administered 2024-01-08: 0.25 mg via INTRAVENOUS
  Filled 2024-01-08: qty 5

## 2024-01-08 MED ORDER — DIPHENHYDRAMINE HCL 50 MG/ML IJ SOLN
25.0000 mg | Freq: Once | INTRAMUSCULAR | Status: AC
  Administered 2024-01-08: 25 mg via INTRAVENOUS
  Filled 2024-01-08: qty 1

## 2024-01-08 MED ORDER — SODIUM CHLORIDE 0.9 % IV SOLN
240.0000 mg | Freq: Once | INTRAVENOUS | Status: AC
  Administered 2024-01-08: 240 mg via INTRAVENOUS
  Filled 2024-01-08: qty 24

## 2024-01-08 MED ORDER — SODIUM CHLORIDE 0.9 % IV SOLN
80.0000 mg/m2 | Freq: Once | INTRAVENOUS | Status: AC
  Administered 2024-01-08: 150 mg via INTRAVENOUS
  Filled 2024-01-08: qty 25

## 2024-01-08 MED ORDER — FAMOTIDINE IN NACL 20-0.9 MG/50ML-% IV SOLN
20.0000 mg | Freq: Once | INTRAVENOUS | Status: AC
  Administered 2024-01-08: 20 mg via INTRAVENOUS
  Filled 2024-01-08: qty 50

## 2024-01-08 MED ORDER — CLINDAMYCIN PHOS (TWICE-DAILY) 1 % EX GEL: Freq: Two times a day (BID) | CUTANEOUS | 0 refills | Status: AC

## 2024-01-08 MED ORDER — SODIUM CHLORIDE 0.9 % IV SOLN: INTRAVENOUS | Status: DC

## 2024-01-08 MED ORDER — SODIUM CHLORIDE 0.9% IV SOLUTION
250.0000 mL | INTRAVENOUS | Status: DC
  Administered 2024-01-08: 100 mL via INTRAVENOUS

## 2024-01-08 NOTE — Progress Notes (Signed)
 Due to weight gain, okay to increase Cyramza  dose to 700 mg today and for future cycles per Powell, NP and note in treatment plan.  Harlene Nasuti, PharmD Oncology Infusion Pharmacist 01/08/2024 11:18 AM

## 2024-01-08 NOTE — Patient Instructions (Signed)
 [facility] - A DEPT OF [facility]  Discharge Instructions: Thank you for choosing [facility] to provide your oncology and hematology care.   If you have a lab appointment with the Cancer Center, please go directly to the Cancer Center and check in at the registration area.   Wear comfortable clothing and clothing appropriate for easy access to any Portacath or PICC line.   We strive to give you quality time with your provider. You may need to reschedule your appointment if you arrive late (15 or more minutes).  Arriving late affects you and other patients whose appointments are after yours.  Also, if you miss three or more appointments without notifying the office, you may be dismissed from the clinic at the provider's discretion.      For prescription refill requests, have your pharmacy contact our office and allow 72 hours for refills to be completed.    Today you received the following chemotherapy and/or immunotherapy agents opdivo , cyramza , taxol       To help prevent nausea and vomiting after your treatment, we encourage you to take your nausea medication as directed.  BELOW ARE SYMPTOMS THAT SHOULD BE REPORTED IMMEDIATELY: *FEVER GREATER THAN 100.4 F (38 C) OR HIGHER *CHILLS OR SWEATING *NAUSEA AND VOMITING THAT IS NOT CONTROLLED WITH YOUR NAUSEA MEDICATION *UNUSUAL SHORTNESS OF BREATH *UNUSUAL BRUISING OR BLEEDING *URINARY PROBLEMS (pain or burning when urinating, or frequent urination) *BOWEL PROBLEMS (unusual diarrhea, constipation, pain near the anus) TENDERNESS IN MOUTH AND THROAT WITH OR WITHOUT PRESENCE OF ULCERS (sore throat, sores in mouth, or a toothache) UNUSUAL RASH, SWELLING OR PAIN  UNUSUAL VAGINAL DISCHARGE OR ITCHING   Items with * indicate a potential emergency and should be followed up as soon as possible or go to the Emergency Department if any problems should occur.  Please show the CHEMOTHERAPY ALERT CARD or  IMMUNOTHERAPY ALERT CARD at check-in to the Emergency Department and triage nurse.  Should you have questions after your visit or need to cancel or reschedule your appointment, please contact [facility] - A DEPT OF [facility]  Dept: [phone]  and follow the prompts.  Office hours are 8:00 a.m. to 4:30 p.m. Monday - Friday. Please note that voicemails left after 4:00 p.m. may not be returned until the following business day.  We are closed weekends and major holidays. You have access to a nurse at all times for urgent questions. Please call the main number to the clinic Dept: [phone] and follow the prompts.   For any non-urgent questions, you may also contact your provider using MyChart. We now offer e-Visits for anyone 56 and older to request care online for non-urgent symptoms. For details visit [email or web address].   Also download the MyChart app! Go to the app store, search MyChart, open the app, select [facility], and log in with your MyChart username and password.

## 2024-01-08 NOTE — Progress Notes (Signed)
 Verbal order given to administer 1 unit of PRBC from Powell Lessen NP, spoke with [facility] in BB type and screen order received, patient will have 1 unit of PRBC transfused on 01/08/2024

## 2024-01-09 LAB — T4: T4, Total: 6.5 ug/dL (ref 4.5–12.0)

## 2024-01-11 ENCOUNTER — Telehealth: Payer: Self-pay | Admitting: Nurse Practitioner

## 2024-01-11 ENCOUNTER — Other Ambulatory Visit (HOSPITAL_COMMUNITY): Payer: Self-pay

## 2024-01-11 LAB — TYPE AND SCREEN
ABO/RH(D): O POS
Antibody Screen: NEGATIVE
Unit division: 0

## 2024-01-11 LAB — BPAM RBC
Blood Product Expiration Date: 202601252359
ISSUE DATE / TIME: 202601021453
Unit Type and Rh: 5100

## 2024-01-15 ENCOUNTER — Ambulatory Visit (HOSPITAL_COMMUNITY)
Admission: RE | Admit: 2024-01-15 | Discharge: 2024-01-15 | Disposition: A | Source: Ambulatory Visit | Attending: Nurse Practitioner

## 2024-01-15 ENCOUNTER — Encounter (HOSPITAL_COMMUNITY): Payer: Self-pay

## 2024-01-15 DIAGNOSIS — C163 Malignant neoplasm of pyloric antrum: Secondary | ICD-10-CM | POA: Insufficient documentation

## 2024-01-15 MED ORDER — IOHEXOL 300 MG/ML  SOLN
100.0000 mL | Freq: Once | INTRAMUSCULAR | Status: AC | PRN
  Administered 2024-01-15: 100 mL via INTRAVENOUS

## 2024-01-21 ENCOUNTER — Inpatient Hospital Stay

## 2024-01-21 ENCOUNTER — Ambulatory Visit: Admitting: Family Medicine

## 2024-01-21 ENCOUNTER — Inpatient Hospital Stay: Admitting: Hematology

## 2024-01-21 VITALS — Temp 98.6°F

## 2024-01-21 VITALS — BP 138/72 | HR 92 | Temp 99.1°F | Resp 16 | Ht 66.0 in | Wt 188.8 lb

## 2024-01-21 DIAGNOSIS — C163 Malignant neoplasm of pyloric antrum: Secondary | ICD-10-CM

## 2024-01-21 DIAGNOSIS — Z5112 Encounter for antineoplastic immunotherapy: Secondary | ICD-10-CM | POA: Diagnosis not present

## 2024-01-21 DIAGNOSIS — D649 Anemia, unspecified: Secondary | ICD-10-CM

## 2024-01-21 DIAGNOSIS — D500 Iron deficiency anemia secondary to blood loss (chronic): Secondary | ICD-10-CM

## 2024-01-21 LAB — CBC WITH DIFFERENTIAL (CANCER CENTER ONLY)
Abs Immature Granulocytes: 0.06 K/uL (ref 0.00–0.07)
Basophils Absolute: 0 K/uL (ref 0.0–0.1)
Basophils Relative: 1 %
Eosinophils Absolute: 0.1 K/uL (ref 0.0–0.5)
Eosinophils Relative: 2 %
HCT: 26.2 % — ABNORMAL LOW (ref 39.0–52.0)
Hemoglobin: 8.1 g/dL — ABNORMAL LOW (ref 13.0–17.0)
Immature Granulocytes: 1 %
Lymphocytes Relative: 24 %
Lymphs Abs: 1.6 K/uL (ref 0.7–4.0)
MCH: 28 pg (ref 26.0–34.0)
MCHC: 30.9 g/dL (ref 30.0–36.0)
MCV: 90.7 fL (ref 80.0–100.0)
Monocytes Absolute: 0.9 K/uL (ref 0.1–1.0)
Monocytes Relative: 14 %
Neutro Abs: 3.9 K/uL (ref 1.7–7.7)
Neutrophils Relative %: 58 %
Platelet Count: 144 K/uL — ABNORMAL LOW (ref 150–400)
RBC: 2.89 MIL/uL — ABNORMAL LOW (ref 4.22–5.81)
RDW: 25.4 % — ABNORMAL HIGH (ref 11.5–15.5)
WBC Count: 6.6 K/uL (ref 4.0–10.5)
nRBC: 0.3 % — ABNORMAL HIGH (ref 0.0–0.2)

## 2024-01-21 LAB — CEA (ACCESS): CEA (CHCC): 611.19 ng/mL — ABNORMAL HIGH (ref 0.00–5.00)

## 2024-01-21 LAB — FERRITIN: Ferritin: 53 ng/mL (ref 24–336)

## 2024-01-21 LAB — CMP (CANCER CENTER ONLY)
ALT: 14 U/L (ref 0–44)
AST: 26 U/L (ref 15–41)
Albumin: 3.3 g/dL — ABNORMAL LOW (ref 3.5–5.0)
Alkaline Phosphatase: 76 U/L (ref 38–126)
Anion gap: 9 (ref 5–15)
BUN: 12 mg/dL (ref 6–20)
CO2: 25 mmol/L (ref 22–32)
Calcium: 8.6 mg/dL — ABNORMAL LOW (ref 8.9–10.3)
Chloride: 106 mmol/L (ref 98–111)
Creatinine: 0.83 mg/dL (ref 0.61–1.24)
GFR, Estimated: >60 mL/min
Glucose, Bld: 91 mg/dL (ref 70–99)
Potassium: 4.3 mmol/L (ref 3.5–5.1)
Sodium: 140 mmol/L (ref 135–145)
Total Bilirubin: 0.5 mg/dL (ref 0.0–1.2)
Total Protein: 6 g/dL — ABNORMAL LOW (ref 6.5–8.1)

## 2024-01-21 LAB — TOTAL PROTEIN, URINE DIPSTICK: Protein, ur: 30 mg/dL — AB

## 2024-01-21 LAB — SAMPLE TO BLOOD BANK

## 2024-01-21 MED ORDER — PALONOSETRON HCL INJECTION 0.25 MG/5ML
0.2500 mg | Freq: Once | INTRAVENOUS | Status: AC
  Administered 2024-01-21: 0.25 mg via INTRAVENOUS
  Filled 2024-01-21: qty 5

## 2024-01-21 MED ORDER — SODIUM CHLORIDE 0.9 % IV SOLN
80.0000 mg/m2 | Freq: Once | INTRAVENOUS | Status: AC
  Administered 2024-01-21: 150 mg via INTRAVENOUS
  Filled 2024-01-21: qty 25

## 2024-01-21 MED ORDER — SODIUM CHLORIDE 0.9 % IV SOLN
240.0000 mg | Freq: Once | INTRAVENOUS | Status: AC
  Administered 2024-01-21: 240 mg via INTRAVENOUS
  Filled 2024-01-21: qty 24

## 2024-01-21 MED ORDER — ACETAMINOPHEN 325 MG PO TABS
650.0000 mg | ORAL_TABLET | Freq: Once | ORAL | Status: AC
  Administered 2024-01-21: 650 mg via ORAL
  Filled 2024-01-21: qty 2

## 2024-01-21 MED ORDER — SODIUM CHLORIDE 0.9 % IV SOLN: INTRAVENOUS | Status: DC

## 2024-01-21 MED ORDER — SODIUM CHLORIDE 0.9 % IV SOLN
700.0000 mg | Freq: Once | INTRAVENOUS | Status: AC
  Administered 2024-01-21: 700 mg via INTRAVENOUS
  Filled 2024-01-21: qty 20

## 2024-01-21 MED ORDER — PANTOPRAZOLE SODIUM 40 MG PO TBEC: 40.0000 mg | DELAYED_RELEASE_TABLET | Freq: Two times a day (BID) | ORAL | 1 refills | Status: DC

## 2024-01-21 MED ORDER — FAMOTIDINE IN NACL 20-0.9 MG/50ML-% IV SOLN
20.0000 mg | Freq: Once | INTRAVENOUS | Status: AC
  Administered 2024-01-21: 20 mg via INTRAVENOUS
  Filled 2024-01-21: qty 50

## 2024-01-21 MED ORDER — DIPHENHYDRAMINE HCL 50 MG/ML IJ SOLN
25.0000 mg | Freq: Once | INTRAMUSCULAR | Status: AC
  Administered 2024-01-21: 25 mg via INTRAVENOUS
  Filled 2024-01-21: qty 1

## 2024-01-21 NOTE — Assessment & Plan Note (Signed)
  cT3N3Mx, with hypermetabolic left supraclavicular node, and indeterminate adrenal nodule, MMR normal ypT3ypN3a, liver and nodes recurrence in 07/2022 -diagnosed 10/18/21 by EGD for 6 month f/u of gastric ulcers, showed mucosal intramucosal adenocarcinoma. MMR normal. -baseline CEA 11/01/21 significantly elevated at 1,989.58. -EUS on 11/06/21 by Dr. Burnette, staged as T3 N3 (at least stage III) -staging CT CAP 11/08/21 showed: stable gastric antrum wall thickening; increased size of upper abdominal lymph nodes; stable left adrenal nodule, 1.9 cm. -PET scan showed FDG avid lymph nodes within the gastrohepatic ligament, portacaval region, and mesentery. Tracer avid left supraclavicular lymph node and indeterminate left adrenal gland nodule with mild FDG uptake. -he underwent [place] node biopsy on 1/17 which was negative for malignant cells, but no lymphoid tissue seen on biopsy  -repeated PET on 02/07/22 after 3 months neoadjuvant chemo showed resolved left supraclavicular lymph node, and significant improvement in regional lymph nodes and the primary tumor, stable and indeterminate left adrenal nodule.  -CT adrenal protocol showed the left adrenal nodule is likely benign adenoma, no biopsy is needed. -Completed neoadjvant chemo FLOT s/p 12 cycles 11/22/21 - 04/27/2022 -restaging PET on 05/16/22 showed significantly hypermetabolic activity at the primary gastric cancer in pylorus, and a small hypermetabolic mesenteric lymph node, no other evidence of metastasis. surgeon Dr. Stevie aware of PET findings -S/p distal gastrectomy 05/20/22, path showed ypT3N3a with clear margins, 7/18 + LNs, and lymphovascular invasion identified. There was some treatment effect in a few lymph nodes nut not in the primary tumor -due to rising tumor marker, he underwent PET scan on July 07, 2022, which unfortunately showed hypermetabolic new adenopathy in right hilar and mesentery, diffuse liver metastasis, consistent with metastatic  recurrence.  I personally reviewed the PET scan images with patient -Unfortunately his disease is not curable at this stage. -The goal of chemotherapy is palliative, to prolong his life -I discussed his next generation sequencing foundation one result, which showed EGFR amplification, no other targetable mutations.  His tumor was previously tested for HER2 which was negative (0-1+).  PD-L1 CPS score was 2%, no significant benefit of immunotherapy.  -He started paclitaxel  and ramucirumab  on 07/25/2022  He developed worsening neuropathy quickly, and I had to switch his treatment to FOLFIRI on 08/29/2022, every 2 weeks, he is overall tolerating well. -restaging CT 01/19/2023 showed disease progression with new pathologic right paratracheal mediastinal lymph nodes as well as increasing size of several liver metastases. -I changed his chemo to taxol , ramucizumab and Nivo on 02/05/2023, every 2 weeks per his request  -restaging CT 04/08/2023 showed partial response in liver and mediastinal nodes  -restaging PET 07/24/2023 showed mixed response comparing to PET in 07/2022 and CT in 04/2023, with significantly improvement in liver mets and slightly progression in nodes and left adrenal glad metastasis -restaging CT 10/12/2023 showed improved liver mets and stable other node mets  -will continue paclitaxel , ramucirumab  and Nivo every 2 weeks

## 2024-01-21 NOTE — Progress Notes (Signed)
 " [facility]   Telephone:([phone]) [phone] Fax:([phone]) [phone]   Clinic Follow up Note   Patient Care Team: Pcp, No as PCP - [name], MD as Consulting Physician (Hematology and Oncology)  Date of Service:  01/21/2024  CHIEF COMPLAINT: f/u of gastric cancer  CURRENT THERAPY:  Paclitaxel  and ramucirumab  every 2 weeks  Oncology History   Gastric cancer (HCC)  cT3N3Mx, with hypermetabolic left supraclavicular node, and indeterminate adrenal nodule, MMR normal ypT3ypN3a, liver and nodes recurrence in 07/2022 -diagnosed 10/18/21 by EGD for 6 month f/u of gastric ulcers, showed mucosal intramucosal adenocarcinoma. MMR normal. -baseline CEA 11/01/21 significantly elevated at 1,989.58. -EUS on 11/06/21 by Dr. Burnette, staged as T3 N3 (at least stage III) -staging CT CAP 11/08/21 showed: stable gastric antrum wall thickening; increased size of upper abdominal lymph nodes; stable left adrenal nodule, 1.9 cm. -PET scan showed FDG avid lymph nodes within the gastrohepatic ligament, portacaval region, and mesentery. Tracer avid left supraclavicular lymph node and indeterminate left adrenal gland nodule with mild FDG uptake. -he underwent [place] node biopsy on 1/17 which was negative for malignant cells, but no lymphoid tissue seen on biopsy  -repeated PET on 02/07/22 after 3 months neoadjuvant chemo showed resolved left supraclavicular lymph node, and significant improvement in regional lymph nodes and the primary tumor, stable and indeterminate left adrenal nodule.  -CT adrenal protocol showed the left adrenal nodule is likely benign adenoma, no biopsy is needed. -Completed neoadjvant chemo FLOT s/p 12 cycles 11/22/21 - 04/27/2022 -restaging PET on 05/16/22 showed significantly hypermetabolic activity at the primary gastric cancer in pylorus, and a small hypermetabolic mesenteric lymph node, no other evidence of metastasis. surgeon Dr. Stevie aware of PET findings -S/p distal gastrectomy  05/20/22, path showed ypT3N3a with clear margins, 7/18 + LNs, and lymphovascular invasion identified. There was some treatment effect in a few lymph nodes nut not in the primary tumor -due to rising tumor marker, he underwent PET scan on July 07, 2022, which unfortunately showed hypermetabolic new adenopathy in right hilar and mesentery, diffuse liver metastasis, consistent with metastatic recurrence.  I personally reviewed the PET scan images with patient -Unfortunately his disease is not curable at this stage. -The goal of chemotherapy is palliative, to prolong his life -I discussed his next generation sequencing foundation one result, which showed EGFR amplification, no other targetable mutations.  His tumor was previously tested for HER2 which was negative (0-1+).  PD-L1 CPS score was 2%, no significant benefit of immunotherapy.  -He started paclitaxel  and ramucirumab  on 07/25/2022  He developed worsening neuropathy quickly, and I had to switch his treatment to FOLFIRI on 08/29/2022, every 2 weeks, he is overall tolerating well. -restaging CT 01/19/2023 showed disease progression with new pathologic right paratracheal mediastinal lymph nodes as well as increasing size of several liver metastases. -I changed his chemo to taxol , ramucizumab and Nivo on 02/05/2023, every 2 weeks per his request  -restaging CT 04/08/2023 showed partial response in liver and mediastinal nodes  -restaging PET 07/24/2023 showed mixed response comparing to PET in 07/2022 and CT in 04/2023, with significantly improvement in liver mets and slightly progression in nodes and left adrenal glad metastasis -restaging CT 10/12/2023 showed improved liver mets and stable other node mets  -will continue paclitaxel , ramucirumab  and Nivo every 2 weeks  Assessment & Plan Metastatic gastric cancer with liver and mediastinal lymph node involvement Metastatic gastric cancer with hepatic and mediastinal lymph node metastases remains stable on current  chemotherapy regimen. Most recent  CT demonstrates stable hepatic lesions and mildly enlarged mediastinal lymph nodes without significant interval change. He has been on therapy for one year, exceeding median duration, and continues to tolerate treatment with manageable adverse effects. Limitations in ambulation and daily activities due to neuropathy, but he remains independent in self-care and light activities. - Reviewed recent CT scan findings with him, confirming stable hepatic and mediastinal disease. - Continued current chemotherapy regimen given disease stability and tolerability.  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 Neuropathy is the primary limiting toxicity of ongoing chemotherapy, though he remains active and exercise provides symptomatic benefit. - Recommended continued vitamin B12 supplementation. - Advised regular exercise and activity to mitigate neuropathy symptoms. - Encouraged use of ice and limb elevation for symptom relief. - Advised maintaining warmth and avoiding cold exposure.  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 Symptoms are managed with elevation, compression, and warm water  soaks. Prior evaluation was unremarkable and edema is stable. - Encouraged continued use of leg elevation and compression stockings. - Advised warm water  soaks for symptomatic relief. - Discussed possible use of over-the-counter magnesium , calcium , and potassium supplements or a multivitamin with minerals for cramping.  Epistaxis secondary to cancer therapy Recurrent mild, unilateral epistaxis, likely secondary to cancer therapy. Bleeding is intermittent and mild, with asymptomatic intervals of several days. - Advised use of nasal saline and petrolatum  for mucosal dryness. -  Recommended gentle pressure and cotton ball packing for active bleeding. - Discussed use of Vicks or humidifier for nasal hydration. - Advised against digital trauma and forceful nose blowing. - Discussed over-the-counter nasal decongestant sprays as needed.  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 Most recent hemoglobin is 8.1 g/dL, not meeting transfusion threshold. Platelet and leukocyte counts are within normal limits. - Monitored blood counts at today's visit. - No transfusion indicated at this time.  Plan - Personally reviewed his restaging CT scan images, and discussed findings with patient and his wife.  Overall stable disease. - Continue current therapy every 2 weeks, monitor his neuropathy closely. - Follow-up in 2 weeks.   SUMMARY OF ONCOLOGIC HISTORY: Oncology History Overview Note   Cancer Staging  Gastric cancer [facility]) Staging form: Stomach, AJCC 8th Edition - Clinical stage from 10/18/2021: Stage III (cT3, cN3a, cM0) - Signed by [name], MD on 11/10/2021     Gastric cancer (HCC)  10/18/2021 Procedure   EGD performed under the care of Dr. Dianna  Findings:  -A large, ulcerated, partially circumferential (involving two thirds of the lumen circumference) mass with no bleeding and stigmata of recent bleeding was found in the gastric antrum, at the pylorus and in the prepyloric region of the stomach. -Segmental severe inflammation characterized by congestion (edema) and erythema was found in the gastric antrum.   10/18/2021 Cancer Staging   Staging form: Stomach, AJCC 8th Edition - Clinical stage from 10/18/2021: Stage III (cT3, cN3a, cM0) - Signed by [name], MD on 11/10/2021 Stage prefix: Initial diagnosis Histologic grade (G): GX Histologic grading system: 3 grade system   10/29/2021 Pathology Results   Stomach, antrum, pylorus, biopsy: -AT LEAST MUCOSA INTRAMUCOSAL ADENOCARCINOMA ARISING WITHIN CHRONIC INACTIVE  GASTRITIS WITH INTESTINAL METAPLASIA (INCOMPLETE TYPE). Negative for dysplasia.  Negative for helicobacter pylori  Mismatch Repair (MMR) Protein Imunohistochemistry (IHC): IHC Expression Result: MLH1: Preserved nuclear expression. MSH2: Preserved nuclear expression. MSH6: Preserved nuclear expression.  PMS2: Preserved nuclear expression. Interpretation: NORMAL   10/31/2021 Initial Diagnosis   Gastric cancer (HCC)   11/01/2021 Tumor Marker   CEA = 1,989.58 (^)   11/06/2021 Procedure   Upper EUS, Dr. Burnette  Impression: - Normal esophagus. - A small amount of food (residue) in the stomach. - Congested, friable (with contact bleeding) and ulcerated mucosa in the prepyloric region of the stomach. - Normal examined duodenum. - There was no evidence of significant pathology in the left lobe of the liver. - Many abnormal lymph nodes (over 10) were visualized in the gastrohepatic ligament (level 18), celiac region (level 20), perigastric region, peripancreatic region and aortocaval region. - Wall thickening was seen in the prepyloric region of the stomach. The thickening appeared to be primarily within the deep mucosa (Layer 2) but extended through the muscularis propria. - Overall constellation of findings consistent with at least T3 N3 Mx (at least stage III) gastric adenocarcinoma.   11/08/2021 Imaging   EXAM: CT CHEST, ABDOMEN, AND PELVIS WITH CONTRAST  IMPRESSION: 1. Similar irregular wall thickening of the gastric antrum, consistent with patient's known primary gastric neoplasm. 2. Increased size of the upper abdominal lymph nodes, concerning for worsening nodal disease involvement. 3. Stable left adrenal nodule common nonspecific possibly a metastatic lesion or adenoma. 4. No evidence of metastatic disease in the chest. 5. Left-sided colonic diverticulosis without findings of acute diverticulitis. 6. Enlarged prostate gland. 7.  Aortic Atherosclerosis (ICD10-I70.0).    11/22/2021 - 04/26/2022 Chemotherapy   Patient is on Treatment Plan : GASTROESOPHAGEAL FLOT q14d X 4 cycles     11/27/2021 Imaging    IMPRESSION: 1. The mass within the gastric antrum is mildly decreased in size when compared with 11/08/2021. There is persistent FDG uptake associated with this mass compatible with residual tumor. 2. Persistent FDG avid lymph nodes within the gastrohepatic ligament, portacaval region, and mesentery. These are mildly decreased in size when compared with 11/08/2021. 3. Tracer avid left supraclavicular lymph node is also mildly decreased in size when compared with 11/08/2021. 4. Indeterminate left adrenal gland nodule with mild FDG uptake. This may represent a lipid poor adenoma. Metastatic disease not exclude. 5. Diffuse increased uptake throughout the bone marrow is favored to represent treatment related change. 6.  Aortic Atherosclerosis (ICD10-I70.0).     02/07/2022 Imaging    IMPRESSION: 1. No substantial change in hypermetabolism associated with the distal gastric lesion. Although difficult to discern on noncontrast CT imaging, the soft tissue component appears to be decreased since the prior PET-CT. 2. Interval resolution of the hypermetabolic left supraclavicular lymph node. 3. Interval marked decrease of the hypermetabolic gastrohepatic ligament lymphadenopathy. Similar decrease in size and hypermetabolism associated with index nodes identified previously in the 4. porta hepatis and hypogastric region. 5. Stable left adrenal nodule with slight hypermetabolism. Nonspecific finding could represent lipid poor adenoma or metastatic deposit. 6. No new suspicious hypermetabolic disease on today's study. 7.  Aortic Atherosclerosis (ICD10-I70.0).   07/25/2022 - 08/15/2022 Chemotherapy   Patient is on Treatment Plan : GASTROESOPHAGEAL Ramucirumab  D1, 15 + Paclitaxel  D1,8,15 q28d     08/29/2022 - 01/24/2023 Chemotherapy   Patient is on Treatment  Plan : COLORECTAL FOLFIRI q14d     02/05/2023 -  Chemotherapy   Patient is on Treatment Plan : GASTROESOPHAGEAL Ramucirumab  D1, 15 + Paclitaxel  D1,8,15 q28d        Discussed the use of AI scribe software for clinical note transcription with the patient, who gave verbal consent to proceed.  History of  Present Illness Jonathan Maynard is a 54 year old male with metastatic gastric cancer who presents for oncology follow-up to assess disease status and management of treatment-related symptoms.  He is receiving chemotherapy every other week for metastatic gastric cancer.  He has chronic chemotherapy-induced peripheral neuropathy and lower extremity edema, worse in the left foot and leg, present for several months. He manages swelling with leg elevation, warm soaks, and compression garments. He has frequent toe cramping that sometimes requires him to stop and move his foot to relieve pain. Neuropathy in his hands does not limit daily activities. Gait is mildly unsteady with occasional staggering, but he denies falls and continues home exercise.  He has intermittent unilateral epistaxis that can occur several times per day, with symptom-free intervals of several days. Bleeding is associated with nasal dryness. He uses saline as previously instructed and asks about additional topical options. He denies nose picking, digital trauma, and forceful nose blowing.  He takes pantoprazole  40 mg twice daily for gastric symptoms. He had difficulty obtaining refills over the past two weeks, which was addressed today. No other medication issues.  He is retired and recently approved for disability benefits. He plans to transition to [facility] and apply for Medicaid. He reports no new symptoms or other changes since the last visit.     All other systems were reviewed with the patient and are negative.  MEDICAL HISTORY:  Past Medical History:  Diagnosis Date   Acute upper GI bleed 04/12/2021    Anemia    Class 1 obesity 04/12/2021   Diverticulosis 04/12/2021   gastric ca with mets to liver 2023   stomach   GERD (gastroesophageal reflux disease)    Hepatic steatosis 04/12/2021   Neuromuscular disorder (HCC)    neuropathy from chemo    SURGICAL HISTORY: Past Surgical History:  Procedure Laterality Date   BIOPSY  04/12/2021   Procedure: BIOPSY;  Surgeon: Dianna Specking, MD;  Location: [facility];  Service: Gastroenterology;;   ESOPHAGOGASTRODUODENOSCOPY N/A 04/12/2021   Procedure: ESOPHAGOGASTRODUODENOSCOPY (EGD);  Surgeon: Dianna Specking, MD;  Location: [facility];  Service: Gastroenterology;  Laterality: N/A;   ESOPHAGOGASTRODUODENOSCOPY N/A 11/06/2021   Procedure: ESOPHAGOGASTRODUODENOSCOPY (EGD);  Surgeon: Burnette Fallow, MD;  Location: [facility];  Service: Gastroenterology;  Laterality: N/A;   GASTRECTOMY N/A 05/20/2022   Procedure: OPEN PARTIAL GASTRECTOMY WITH GASTROJEJUNAL ANASTOMOSIS;  Surgeon: Stevie, Herlene Righter, MD;  Location: [facility];  Service: General;  Laterality: N/A;   HERNIA REPAIR Left    inguinal   IR IMAGING GUIDED PORT INSERTION  11/15/2021   LAPAROSCOPY N/A 05/20/2022   Procedure: LAPAROSCOPY DIAGNOSTIC;  Surgeon: Stevie Herlene Righter, MD;  Location: [facility];  Service: General;  Laterality: N/A;   UPPER ESOPHAGEAL ENDOSCOPIC ULTRASOUND (EUS) Bilateral 11/06/2021   Procedure: UPPER ESOPHAGEAL ENDOSCOPIC ULTRASOUND (EUS);  Surgeon: Burnette Fallow, MD;  Location: [facility];  Service: Gastroenterology;  Laterality: Bilateral;    I have reviewed the social history and family history with the patient and they are unchanged from previous note.  ALLERGIES:  has no known allergies.  MEDICATIONS:  Current Outpatient Medications  Medication Sig Dispense Refill   acetaminophen  (TYLENOL ) 500 MG tablet Take 1 tablet (500 mg total) by mouth 2 (two) times daily. 60 tablet 3   clindamycin  (CLINDAGEL) 1 % gel Apply topically 2 (two) times daily. 60  g 0   dexamethasone  (DECADRON ) 4 MG tablet Take 1 tablet (4 mg total) by mouth daily. Start the day after chemotherapy for 1-3 days.  Take with food. 30 tablet 1   diclofenac  Sodium (VOLTAREN  ARTHRITIS PAIN) 1 % GEL Apply 4 g topically 4 (four) times daily. 100 g 3   doxycycline  (VIBRA -TABS) 100 MG tablet Take 1 tablet (100 mg total) by mouth 2 (two) times daily. 20 tablet 0   gabapentin  (NEURONTIN ) 600 MG tablet Take 2 tablets (1,200 mg total) by mouth 3 (three) times daily. 180 tablet 3   magic mouthwash (nystatin , lidocaine , diphenhydrAMINE , alum & mag hydroxide) suspension Swish for 5 seconds and swallow 5 mLs by mouth 3 (three) times daily as needed for mouth pain. 140 mL 0   ondansetron  (ZOFRAN -ODT) 4 MG disintegrating tablet Dissolve 1 tablet (4 mg total) by mouth every 6 (six) hours as needed for nausea or vomiting. 30 tablet 1   pantoprazole  (PROTONIX ) 40 MG tablet Take 1 tablet by mouth 2 (two) times daily.     pantoprazole  (PROTONIX ) 40 MG tablet Take 1 tablet (40 mg) by mouth 2 times daily. 180 tablet 1   prochlorperazine  (COMPAZINE ) 10 MG tablet Take 1 tablet (10 mg total) by mouth every 6 (six) hours as needed for nausea or vomiting. 30 tablet 2   No current facility-administered medications for this visit.   Facility-Administered Medications Ordered in Other Visits  Medication Dose Route Frequency Provider Last Rate Last Admin   0.9 %  sodium chloride  infusion   Intravenous Continuous [name], MD   Stopped at 01/21/24 1245    PHYSICAL EXAMINATION: ECOG PERFORMANCE STATUS: 1 - Symptomatic but completely ambulatory  Vitals:   01/21/24 0855  BP: 138/72  Pulse: 92  Resp: 16  Temp: 99.1 F (37.3 C)  SpO2: 98%   Wt Readings from Last 3 Encounters:  01/21/24 188 lb 12.8 oz (85.6 kg)  01/08/24 188 lb (85.3 kg)  12/24/23 179 lb 8 oz (81.4 kg)     GENERAL:alert, no distress and comfortable SKIN: skin color, texture, turgor are normal, no rashes or significant  lesions EYES: normal, Conjunctiva are pink and non-injected, sclera clear NECK: supple, thyroid  normal size, non-tender, without nodularity LYMPH:  no palpable lymphadenopathy in the cervical, axillary  LUNGS: clear to auscultation and percussion with normal breathing effort HEART: regular rate & rhythm and no murmurs, (+) bilateral lower extremity edema up to knee ABDOMEN:abdomen soft, non-tender and normal bowel sounds Musculoskeletal:no cyanosis of digits and no clubbing    Physical Exam   LABORATORY DATA:  I have reviewed the data as listed    Latest Ref Rng & Units 01/21/2024    8:45 AM 01/08/2024    9:17 AM 12/24/2023    7:45 AM  CBC  WBC 4.0 - 10.5 K/uL 6.6  7.5  9.3   Hemoglobin 13.0 - 17.0 g/dL 8.1  7.4  7.5   Hematocrit 39.0 - 52.0 % 26.2  24.2  24.7   Platelets 150 - 400 K/uL 144  157  121         Latest Ref Rng & Units 01/21/2024    8:45 AM 01/08/2024    9:17 AM 12/24/2023    7:45 AM  CMP  Glucose 70 - 99 mg/dL 91  897  83   BUN 6 - 20 mg/dL 12  11  13    Creatinine 0.61 - 1.24 mg/dL 9.16  9.01  9.10   Sodium 135 - 145 mmol/L 140  141  138   Potassium 3.5 - 5.1 mmol/L 4.3  4.1  4.5   Chloride 98 - 111 mmol/L 106  110  104   CO2 22 - 32 mmol/L 25  24  25    Calcium  8.9 - 10.3 mg/dL 8.6  8.4  9.1   Total Protein 6.5 - 8.1 g/dL 6.0  5.9  6.5   Total Bilirubin 0.0 - 1.2 mg/dL 0.5  0.4  0.4   Alkaline Phos 38 - 126 U/L 76  68  79   AST 15 - 41 U/L 26  23  26    ALT 0 - 44 U/L 14  12  14        RADIOGRAPHIC STUDIES: I have personally reviewed the radiological images as listed and agreed with the findings in the report. No results found.    Orders Placed This Encounter  Procedures   CBC with Differential (Cancer Center Only)    Standing Status:   Future    Expected Date:   03/03/2024    Expiration Date:   03/03/2025   CMP (Cancer Center only)    Standing Status:   Future    Expected Date:   03/03/2024    Expiration Date:   03/03/2025   T4    Standing Status:    Future    Expected Date:   03/03/2024    Expiration Date:   03/03/2025   TSH    Standing Status:   Future    Expected Date:   03/03/2024    Expiration Date:   03/03/2025   Total Protein, Urine dipstick    Standing Status:   Future    Expected Date:   03/03/2024    Expiration Date:   03/03/2025   CBC with Differential (Cancer Center Only)    Standing Status:   Future    Expected Date:   03/17/2024    Expiration Date:   03/17/2025   CMP (Cancer Center only)    Standing Status:   Future    Expected Date:   03/17/2024    Expiration Date:   03/17/2025   CBC with Differential (Cancer Center Only)    Standing Status:   Future    Expected Date:   03/31/2024    Expiration Date:   03/31/2025   CMP (Cancer Center only)    Standing Status:   Future    Expected Date:   03/31/2024    Expiration Date:   03/31/2025   T4    Standing Status:   Future    Expected Date:   03/31/2024    Expiration Date:   03/31/2025   TSH    Standing Status:   Future    Expected Date:   03/31/2024    Expiration Date:   03/31/2025   Total Protein, Urine dipstick    Standing Status:   Future    Expected Date:   03/31/2024    Expiration Date:   03/31/2025   CBC with Differential (Cancer Center Only)    Standing Status:   Future    Expected Date:   04/14/2024    Expiration Date:   04/14/2025   CMP (Cancer Center only)    Standing Status:   Future    Expected Date:   04/14/2024    Expiration Date:   04/14/2025   All questions were answered. The patient knows to call the clinic with any problems, questions or concerns. No barriers to learning was detected. The total time spent in the appointment was 30 minutes, including review of chart and various tests results, discussions about plan of care and coordination of care plan     Onita  Lanny, MD 01/21/2024     "

## 2024-01-21 NOTE — Patient Instructions (Addendum)
 [facility] - A DEPT OF [facility]  Discharge Instructions: Thank you for choosing [facility] to provide your oncology and hematology care.   If you have a lab appointment with the Cancer Center, please go directly to the Cancer Center and check in at the registration area.   Wear comfortable clothing and clothing appropriate for easy access to any Portacath or PICC line.   We strive to give you quality time with your provider. You may need to reschedule your appointment if you arrive late (15 or more minutes).  Arriving late affects you and other patients whose appointments are after yours.  Also, if you miss three or more appointments without notifying the office, you may be dismissed from the clinic at the providers discretion.      For prescription refill requests, have your pharmacy contact our office and allow 72 hours for refills to be completed.    Today you received the following chemotherapy and/or immunotherapy agents: Nivolumab , Ramucirumab , Paclitaxel    To help prevent nausea and vomiting after your treatment, we encourage you to take your nausea medication as directed.  BELOW ARE SYMPTOMS THAT SHOULD BE REPORTED IMMEDIATELY: *FEVER GREATER THAN 100.4 F (38 C) OR HIGHER *CHILLS OR SWEATING *NAUSEA AND VOMITING THAT IS NOT CONTROLLED WITH YOUR NAUSEA MEDICATION *UNUSUAL SHORTNESS OF BREATH *UNUSUAL BRUISING OR BLEEDING *URINARY PROBLEMS (pain or burning when urinating, or frequent urination) *BOWEL PROBLEMS (unusual diarrhea, constipation, pain near the anus) TENDERNESS IN MOUTH AND THROAT WITH OR WITHOUT PRESENCE OF ULCERS (sore throat, sores in mouth, or a toothache) UNUSUAL RASH, SWELLING OR PAIN  UNUSUAL VAGINAL DISCHARGE OR ITCHING   Items with * indicate a potential emergency and should be followed up as soon as possible or go to the Emergency Department if any problems should occur.  Please show the CHEMOTHERAPY ALERT  CARD or IMMUNOTHERAPY ALERT CARD at check-in to the Emergency Department and triage nurse.  Should you have questions after your visit or need to cancel or reschedule your appointment, please contact [facility] - A DEPT OF [facility]  Dept: [phone]  and follow the prompts.  Office hours are 8:00 a.m. to 4:30 p.m. Monday - Friday. Please note that voicemails left after 4:00 p.m. may not be returned until the following business day.  We are closed weekends and major holidays. You have access to a nurse at all times for urgent questions. Please call the main number to the clinic Dept: [phone] and follow the prompts.   For any non-urgent questions, you may also contact your provider using MyChart. We now offer e-Visits for anyone 50 and older to request care online for non-urgent symptoms. For details visit [email or web address].   Also download the MyChart app! Go to the app store, search MyChart, open the app, select [place], and log in with your MyChart username and password.

## 2024-01-22 ENCOUNTER — Encounter: Payer: Self-pay | Admitting: Hematology

## 2024-01-22 ENCOUNTER — Encounter: Payer: Self-pay | Admitting: Physician Assistant

## 2024-01-22 ENCOUNTER — Encounter: Payer: Self-pay | Admitting: Nurse Practitioner

## 2024-01-22 ENCOUNTER — Other Ambulatory Visit: Payer: Self-pay

## 2024-01-23 ENCOUNTER — Other Ambulatory Visit: Payer: Self-pay

## 2024-02-02 ENCOUNTER — Encounter: Payer: Self-pay | Admitting: Hematology

## 2024-02-02 ENCOUNTER — Encounter: Payer: Self-pay | Admitting: Physician Assistant

## 2024-02-02 NOTE — Telephone Encounter (Signed)
 Erroneous encounter

## 2024-02-04 ENCOUNTER — Other Ambulatory Visit (HOSPITAL_COMMUNITY): Payer: Self-pay

## 2024-02-04 ENCOUNTER — Encounter: Payer: Self-pay | Admitting: Physician Assistant

## 2024-02-04 ENCOUNTER — Inpatient Hospital Stay

## 2024-02-04 ENCOUNTER — Encounter: Payer: Self-pay | Admitting: Hematology

## 2024-02-04 ENCOUNTER — Inpatient Hospital Stay: Admitting: Hematology

## 2024-02-04 ENCOUNTER — Other Ambulatory Visit: Payer: Self-pay

## 2024-02-04 VITALS — BP 122/80 | HR 101 | Temp 98.7°F | Resp 17 | Wt 187.2 lb

## 2024-02-04 VITALS — BP 137/84 | HR 81 | Temp 98.3°F | Resp 20

## 2024-02-04 DIAGNOSIS — D649 Anemia, unspecified: Secondary | ICD-10-CM

## 2024-02-04 DIAGNOSIS — D500 Iron deficiency anemia secondary to blood loss (chronic): Secondary | ICD-10-CM

## 2024-02-04 DIAGNOSIS — C163 Malignant neoplasm of pyloric antrum: Secondary | ICD-10-CM | POA: Diagnosis not present

## 2024-02-04 DIAGNOSIS — Z5112 Encounter for antineoplastic immunotherapy: Secondary | ICD-10-CM | POA: Diagnosis not present

## 2024-02-04 LAB — CBC WITH DIFFERENTIAL (CANCER CENTER ONLY)
Abs Immature Granulocytes: 0.07 10*3/uL (ref 0.00–0.07)
Basophils Absolute: 0 10*3/uL (ref 0.0–0.1)
Basophils Relative: 0 %
Eosinophils Absolute: 0 10*3/uL (ref 0.0–0.5)
Eosinophils Relative: 0 %
HCT: 26 % — ABNORMAL LOW (ref 39.0–52.0)
Hemoglobin: 8.1 g/dL — ABNORMAL LOW (ref 13.0–17.0)
Immature Granulocytes: 1 %
Lymphocytes Relative: 9 %
Lymphs Abs: 1.1 10*3/uL (ref 0.7–4.0)
MCH: 28 pg (ref 26.0–34.0)
MCHC: 31.2 g/dL (ref 30.0–36.0)
MCV: 90 fL (ref 80.0–100.0)
Monocytes Absolute: 1.1 10*3/uL — ABNORMAL HIGH (ref 0.1–1.0)
Monocytes Relative: 8 %
Neutro Abs: 10.7 10*3/uL — ABNORMAL HIGH (ref 1.7–7.7)
Neutrophils Relative %: 82 %
Platelet Count: 148 10*3/uL — ABNORMAL LOW (ref 150–400)
RBC: 2.89 MIL/uL — ABNORMAL LOW (ref 4.22–5.81)
RDW: 26.7 % — ABNORMAL HIGH (ref 11.5–15.5)
WBC Count: 13 10*3/uL — ABNORMAL HIGH (ref 4.0–10.5)
nRBC: 0.4 % — ABNORMAL HIGH (ref 0.0–0.2)

## 2024-02-04 LAB — CMP (CANCER CENTER ONLY)
ALT: 17 U/L (ref 0–44)
AST: 28 U/L (ref 15–41)
Albumin: 3.9 g/dL (ref 3.5–5.0)
Alkaline Phosphatase: 85 U/L (ref 38–126)
Anion gap: 11 (ref 5–15)
BUN: 19 mg/dL (ref 6–20)
CO2: 22 mmol/L (ref 22–32)
Calcium: 9.1 mg/dL (ref 8.9–10.3)
Chloride: 104 mmol/L (ref 98–111)
Creatinine: 0.94 mg/dL (ref 0.61–1.24)
GFR, Estimated: >60 mL/min
Glucose, Bld: 118 mg/dL — ABNORMAL HIGH (ref 70–99)
Potassium: 5.1 mmol/L (ref 3.5–5.1)
Sodium: 137 mmol/L (ref 135–145)
Total Bilirubin: 0.6 mg/dL (ref 0.0–1.2)
Total Protein: 6.6 g/dL (ref 6.5–8.1)

## 2024-02-04 LAB — TSH: TSH: 0.574 u[IU]/mL (ref 0.350–4.500)

## 2024-02-04 LAB — SAMPLE TO BLOOD BANK

## 2024-02-04 LAB — TOTAL PROTEIN, URINE DIPSTICK: Protein, ur: 100 mg/dL — AB

## 2024-02-04 MED ORDER — SODIUM CHLORIDE 0.9 % IV SOLN
240.0000 mg | Freq: Once | INTRAVENOUS | Status: AC
  Administered 2024-02-04: 240 mg via INTRAVENOUS
  Filled 2024-02-04: qty 24

## 2024-02-04 MED ORDER — PANTOPRAZOLE SODIUM 40 MG PO TBEC
40.0000 mg | DELAYED_RELEASE_TABLET | Freq: Two times a day (BID) | ORAL | 1 refills | Status: AC
  Filled 2024-02-04 (×2): qty 60, 30d supply, fill #0

## 2024-02-04 MED ORDER — PALONOSETRON HCL INJECTION 0.25 MG/5ML
0.2500 mg | Freq: Once | INTRAVENOUS | Status: AC
  Administered 2024-02-04: 0.25 mg via INTRAVENOUS
  Filled 2024-02-04: qty 5

## 2024-02-04 MED ORDER — SODIUM CHLORIDE 0.9 % IV SOLN
700.0000 mg | Freq: Once | INTRAVENOUS | Status: AC
  Administered 2024-02-04: 700 mg via INTRAVENOUS
  Filled 2024-02-04: qty 20

## 2024-02-04 MED ORDER — POTASSIUM CHLORIDE CRYS ER 20 MEQ PO TBCR
20.0000 meq | EXTENDED_RELEASE_TABLET | Freq: Every day | ORAL | 0 refills | Status: AC
  Filled 2024-02-04: qty 30, 30d supply, fill #0

## 2024-02-04 MED ORDER — SODIUM CHLORIDE 0.9 % IV SOLN: INTRAVENOUS | Status: DC

## 2024-02-04 MED ORDER — SODIUM CHLORIDE 0.9 % IV SOLN
80.0000 mg/m2 | Freq: Once | INTRAVENOUS | Status: AC
  Administered 2024-02-04: 150 mg via INTRAVENOUS
  Filled 2024-02-04: qty 25

## 2024-02-04 MED ORDER — SODIUM CHLORIDE 0.9% FLUSH: 10.0000 mL | INTRAVENOUS | Status: DC | PRN

## 2024-02-04 MED ORDER — ACETAMINOPHEN 325 MG PO TABS
650.0000 mg | ORAL_TABLET | Freq: Once | ORAL | Status: AC
  Administered 2024-02-04: 650 mg via ORAL
  Filled 2024-02-04: qty 2

## 2024-02-04 MED ORDER — DIPHENHYDRAMINE HCL 50 MG/ML IJ SOLN
25.0000 mg | Freq: Once | INTRAMUSCULAR | Status: AC
  Administered 2024-02-04: 25 mg via INTRAVENOUS
  Filled 2024-02-04: qty 1

## 2024-02-04 MED ORDER — FAMOTIDINE IN NACL 20-0.9 MG/50ML-% IV SOLN
20.0000 mg | Freq: Once | INTRAVENOUS | Status: AC
  Administered 2024-02-04: 20 mg via INTRAVENOUS
  Filled 2024-02-04: qty 50

## 2024-02-04 NOTE — Progress Notes (Signed)
 " [facility]   Telephone:([phone] Fax:([phone]) [phone]   Clinic Follow up Note   Patient Care Team: Pcp, No as PCP - [name], MD as Consulting Physician (Hematology and Oncology)  Date of Service:  02/04/2024  CHIEF COMPLAINT: f/u of gastric cancer  CURRENT THERAPY:  Chemotherapy paclitaxel  and ramucirumab , nivolumab           every 2 weeks  Oncology History   Gastric cancer (HCC)  cT3N3Mx, with hypermetabolic left supraclavicular node, and indeterminate adrenal nodule, MMR normal ypT3ypN3a, liver and nodes recurrence in 07/2022 -diagnosed 10/18/21 by EGD for 6 month f/u of gastric ulcers, showed mucosal intramucosal adenocarcinoma. MMR normal. -baseline CEA 11/01/21 significantly elevated at 1,989.58. -EUS on 11/06/21 by Dr. Burnette, staged as T3 N3 (at least stage III) -staging CT CAP 11/08/21 showed: stable gastric antrum wall thickening; increased size of upper abdominal lymph nodes; stable left adrenal nodule, 1.9 cm. -PET scan showed FDG avid lymph nodes within the gastrohepatic ligament, portacaval region, and mesentery. Tracer avid left supraclavicular lymph node and indeterminate left adrenal gland nodule with mild FDG uptake. -he underwent [place]dock node biopsy on 1/17 which was negative for malignant cells, but no lymphoid tissue seen on biopsy  -repeated PET on 02/07/22 after 3 months neoadjuvant chemo showed resolved left supraclavicular lymph node, and significant improvement in regional lymph nodes and the primary tumor, stable and indeterminate left adrenal nodule.  -CT adrenal protocol showed the left adrenal nodule is likely benign adenoma, no biopsy is needed. -Completed neoadjvant chemo FLOT s/p 12 cycles 11/22/21 - 04/27/2022 -restaging PET on 05/16/22 showed significantly hypermetabolic activity at the primary gastric cancer in pylorus, and a small hypermetabolic mesenteric lymph node, no other evidence of metastasis. surgeon Dr. Stevie aware of PET  findings -S/p distal gastrectomy 05/20/22, path showed ypT3N3a with clear margins, 7/18 + LNs, and lymphovascular invasion identified. There was some treatment effect in a few lymph nodes nut not in the primary tumor -due to rising tumor marker, he underwent PET scan on July 07, 2022, which unfortunately showed hypermetabolic new adenopathy in right hilar and mesentery, diffuse liver metastasis, consistent with metastatic recurrence.  I personally reviewed the PET scan images with patient -Unfortunately his disease is not curable at this stage. -The goal of chemotherapy is palliative, to prolong his life -I discussed his next generation sequencing foundation one result, which showed EGFR amplification, no other targetable mutations.  His tumor was previously tested for HER2 which was negative (0-1+).  PD-L1 CPS score was 2%, no significant benefit of immunotherapy.  -He started paclitaxel  and ramucirumab  on 07/25/2022  He developed worsening neuropathy quickly, and I had to switch his treatment to FOLFIRI on 08/29/2022, every 2 weeks, he is overall tolerating well. -restaging CT 01/19/2023 showed disease progression with new pathologic right paratracheal mediastinal lymph nodes as well as increasing size of several liver metastases. -I changed his chemo to taxol , ramucizumab and Nivo on 02/05/2023, every 2 weeks per his request  -restaging CT 04/08/2023 showed partial response in liver and mediastinal nodes  -restaging PET 07/24/2023 showed mixed response comparing to PET in 07/2022 and CT in 04/2023, with significantly improvement in liver mets and slightly progression in nodes and left adrenal glad metastasis -restaging CT 10/12/2023 showed improved liver mets and stable other node mets  -will continue paclitaxel , ramucirumab  and Nivo every 2 weeks  Assessment & Plan Metastatic gastric cancer Recurrent, metastatic gastric adenocarcinoma with hepatic, mediastinal, mesenteric nodal, and left adrenal metastases.  Disease  has remained stable for over a year on paclitaxel , ramucirumab , and nivolumab , with partial response in liver and mediastinal nodes and mixed response on PET. Most recent CT demonstrates stable hepatic lesions and mildly enlarged mediastinal nodes. CEA remains between 500-700. He tolerates therapy well, with primary toxicities of neuropathy, edema, and anemia. Prognosis remains guarded but favorable given stable disease and good response to therapy. - Continued paclitaxel , ramucirumab , and nivolumab  every 2 weeks (next infusion at 10:00 AM). - Planned repeat CT imaging in late March and ongoing serial imaging every 2-3 months to reassess disease status. - Monitored CEA and other tumor markers, and obtained labs prior to each cycle for cytopenias and organ dysfunction. - Performed clinical assessment every 2 weeks prior to chemotherapy. - Held chemotherapy for significant cytopenias, organ dysfunction, or intolerable toxicity (ANC <1.0 x10^9/L, platelets <75-100 x10^9/L, hemoglobin <8 g/dL). - Continued supportive care and symptom management for neuropathy, edema, anemia, and epistaxis. - Scheduled follow-up in two weeks on February 12th.  Chemotherapy-induced peripheral neuropathy Persistent, intermittent neuropathy affecting hands and feet, with cramping and gait disturbance. No falls, maintains good hand function, but reports occasional unsteadiness. Neuropathy is the primary limiting toxicity of current and prior regimens. - Maintained current management and monitored for progression. - Encouraged regular activity, weight-bearing exercises, and stretching for joint pain and stiffness. - Monitored for worsening symptoms, falls, or functional decline. - Will consider referral to physical therapy if gait instability worsens.  Cancer and chemotherapy-induced lower extremity edema Chronic lower extremity edema managed with elevation and compression stockings. Diuretic prescribed but not yet  initiated; aware of need for potassium supplementation to prevent hypokalemia. Edema is a persistent toxicity from chemotherapy. - Advised initiation of diuretic and potassium supplementation as prescribed. - Discussed diuretic administration timing to minimize nocturia. - Ordered potassium and Protonix  refills to [employer]. - Continued compression stockings and elevation of feet. - Monitored for worsening edema or signs of volume overload.  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 Etiology is multifactorial, related to chemotherapy and malignancy. - Monitored hemoglobin and other labs prior to each chemotherapy cycle. - No transfusion required at this time; transfusion threshold is hemoglobin <8 g/dL or symptomatic anemia. - Continued supportive care and monitored for symptoms of fatigue or functional decline.  Plan - He is clinically stable, lab reviewed, adequate for treatment, will proceed to chemo today and continue every 2 weeks - Up in 2 weeks   SUMMARY OF ONCOLOGIC HISTORY: Oncology History Overview Note   Cancer Staging  Gastric cancer [facility]) Staging form: Stomach, AJCC 8th Edition - Clinical stage from 10/18/2021: Stage III (cT3, cN3a, cM0) - Signed by [name], MD on 11/10/2021     Gastric cancer (HCC)  10/18/2021 Procedure   EGD performed under the care of Dr. Dianna  Findings:  -A large, ulcerated, partially circumferential (involving two thirds of the lumen circumference) mass with no bleeding and stigmata of recent bleeding was found in the gastric antrum, at the pylorus and in the prepyloric region of the stomach. -Segmental severe inflammation characterized by congestion (edema) and erythema was found in the gastric antrum.   10/18/2021 Cancer Staging   Staging form: Stomach, AJCC 8th Edition - Clinical stage from 10/18/2021: Stage III (cT3, cN3a, cM0) - Signed by  [name], MD on 11/10/2021 Stage prefix: Initial diagnosis Histologic grade (G): GX Histologic grading system: 3 grade system   10/29/2021 Pathology Results   Stomach, antrum,  pylorus, biopsy: -AT LEAST MUCOSA INTRAMUCOSAL ADENOCARCINOMA ARISING WITHIN CHRONIC INACTIVE GASTRITIS WITH INTESTINAL METAPLASIA (INCOMPLETE TYPE). Negative for dysplasia.  Negative for helicobacter pylori  Mismatch Repair (MMR) Protein Imunohistochemistry (IHC): IHC Expression Result: MLH1: Preserved nuclear expression. MSH2: Preserved nuclear expression. MSH6: Preserved nuclear expression. PMS2: Preserved nuclear expression. Interpretation: NORMAL   10/31/2021 Initial Diagnosis   Gastric cancer (HCC)   11/01/2021 Tumor Marker   CEA = 1,989.58 (^)   11/06/2021 Procedure   Upper EUS, Dr. Burnette  Impression: - Normal esophagus. - A small amount of food (residue) in the stomach. - Congested, friable (with contact bleeding) and ulcerated mucosa in the prepyloric region of the stomach. - Normal examined duodenum. - There was no evidence of significant pathology in the left lobe of the liver. - Many abnormal lymph nodes (over 10) were visualized in the gastrohepatic ligament (level 18), celiac region (level 20), perigastric region, peripancreatic region and aortocaval region. - Wall thickening was seen in the prepyloric region of the stomach. The thickening appeared to be primarily within the deep mucosa (Layer 2) but extended through the muscularis propria. - Overall constellation of findings consistent with at least T3 N3 Mx (at least stage III) gastric adenocarcinoma.   11/08/2021 Imaging   EXAM: CT CHEST, ABDOMEN, AND PELVIS WITH CONTRAST  IMPRESSION: 1. Similar irregular wall thickening of the gastric antrum, consistent with patient's known primary gastric neoplasm. 2. Increased size of the upper abdominal lymph nodes, concerning for worsening nodal disease involvement. 3. Stable left adrenal nodule  common nonspecific possibly a metastatic lesion or adenoma. 4. No evidence of metastatic disease in the chest. 5. Left-sided colonic diverticulosis without findings of acute diverticulitis. 6. Enlarged prostate gland. 7.  Aortic Atherosclerosis (ICD10-I70.0).   11/22/2021 - 04/26/2022 Chemotherapy   Patient is on Treatment Plan : GASTROESOPHAGEAL FLOT q14d X 4 cycles     11/27/2021 Imaging    IMPRESSION: 1. The mass within the gastric antrum is mildly decreased in size when compared with 11/08/2021. There is persistent FDG uptake associated with this mass compatible with residual tumor. 2. Persistent FDG avid lymph nodes within the gastrohepatic ligament, portacaval region, and mesentery. These are mildly decreased in size when compared with 11/08/2021. 3. Tracer avid left supraclavicular lymph node is also mildly decreased in size when compared with 11/08/2021. 4. Indeterminate left adrenal gland nodule with mild FDG uptake. This may represent a lipid poor adenoma. Metastatic disease not exclude. 5. Diffuse increased uptake throughout the bone marrow is favored to represent treatment related change. 6.  Aortic Atherosclerosis (ICD10-I70.0).     02/07/2022 Imaging    IMPRESSION: 1. No substantial change in hypermetabolism associated with the distal gastric lesion. Although difficult to discern on noncontrast CT imaging, the soft tissue component appears to be decreased since the prior PET-CT. 2. Interval resolution of the hypermetabolic left supraclavicular lymph node. 3. Interval marked decrease of the hypermetabolic gastrohepatic ligament lymphadenopathy. Similar decrease in size and hypermetabolism associated with index nodes identified previously in the 4. porta hepatis and hypogastric region. 5. Stable left adrenal nodule with slight hypermetabolism. Nonspecific finding could represent lipid poor adenoma or metastatic deposit. 6. No new suspicious hypermetabolic  disease on today's study. 7.  Aortic Atherosclerosis (ICD10-I70.0).   07/25/2022 - 08/15/2022 Chemotherapy   Patient is on Treatment Plan : GASTROESOPHAGEAL Ramucirumab  D1, 15 + Paclitaxel  D1,8,15 q28d     08/29/2022 - 01/24/2023 Chemotherapy   Patient is on Treatment Plan : COLORECTAL FOLFIRI q14d     02/05/2023 -  Chemotherapy   Patient is on Treatment Plan : GASTROESOPHAGEAL Ramucirumab  D1, 15 + Paclitaxel  D1,8,15 q28d        Discussed the use of AI scribe software for clinical note transcription with the patient, who gave verbal consent to proceed.  History of Present Illness Jonathan Maynard is a 54 year old male with metastatic gastric adenocarcinoma who presents for routine oncology follow-up to assess disease status and treatment tolerance.  He has been on this regimen for over a year for metastases to the liver, mediastinal and mesenteric lymph nodes, and left adrenal gland.  He has persistent lower back pain and stiffness after prolonged sitting that improves with activity and stretching. He also has generalized myalgias and fatigue but remains independent in self-care and light activities.  He has intermittent peripheral neuropathy in his feet with occasional unsteadiness but no falls. He has no hand dysfunction and maintains good dexterity. He takes small steps and uses caution when ambulating.  He has chronic lower extremity edema managed with compression socks and leg elevation. He has not started his prescribed diuretic yet but plans to and understands the need for potassium supplementation. He reports insurance barriers to obtaining potassium and Protonix  and requests these be sent to his preferred pharmacy.  Recent labs show stable hemoglobin at 8.1 g/dL, with no transfusion required. CEA has ranged from 500 to 700 over the past month, with the most recent value 600.  Jan 21, 2024: Follow-up for metastatic gastric cancer; disease remains stable on paclitaxel ,  ramucirumab , and nivolumab  regimen with stable hepatic lesions and mediastinal lymph nodes. Side effects include persistent peripheral neuropathy, lower extremity edema, mild recurrent epistaxis, and chronic anemia (hemoglobin 8.1 g/dL), all managed symptomatically. Patient remains independent in self-care and light activities despite limitations in ambulation due to neuropathy.     All other systems were reviewed with the patient and are negative.  MEDICAL HISTORY:  Past Medical History:  Diagnosis Date   Acute upper GI bleed 04/12/2021   Anemia    Class 1 obesity 04/12/2021   Diverticulosis 04/12/2021   gastric ca with mets to liver 2023   stomach   GERD (gastroesophageal reflux disease)    Hepatic steatosis 04/12/2021   Neuromuscular disorder (HCC)    neuropathy from chemo    SURGICAL HISTORY: Past Surgical History:  Procedure Laterality Date   BIOPSY  04/12/2021   Procedure: BIOPSY;  Surgeon: Dianna Specking, MD;  Location: [facility];  Service: Gastroenterology;;   ESOPHAGOGASTRODUODENOSCOPY N/A 04/12/2021   Procedure: ESOPHAGOGASTRODUODENOSCOPY (EGD);  Surgeon: Dianna Specking, MD;  Location: [facility];  Service: Gastroenterology;  Laterality: N/A;   ESOPHAGOGASTRODUODENOSCOPY N/A 11/06/2021   Procedure: ESOPHAGOGASTRODUODENOSCOPY (EGD);  Surgeon: Burnette Fallow, MD;  Location: [facility];  Service: Gastroenterology;  Laterality: N/A;   GASTRECTOMY N/A 05/20/2022   Procedure: OPEN PARTIAL GASTRECTOMY WITH GASTROJEJUNAL ANASTOMOSIS;  Surgeon: Stevie, Herlene Righter, MD;  Location: [facility];  Service: General;  Laterality: N/A;   HERNIA REPAIR Left    inguinal   IR IMAGING GUIDED PORT INSERTION  11/15/2021   LAPAROSCOPY N/A 05/20/2022   Procedure: LAPAROSCOPY DIAGNOSTIC;  Surgeon: Stevie Herlene Righter, MD;  Location: [facility];  Service: General;  Laterality: N/A;   UPPER ESOPHAGEAL ENDOSCOPIC ULTRASOUND (EUS) Bilateral 11/06/2021   Procedure: UPPER ESOPHAGEAL  ENDOSCOPIC ULTRASOUND (EUS);  Surgeon: Burnette Fallow, MD;  Location: [facility];  Service: Gastroenterology;  Laterality: Bilateral;    I have reviewed the social history and family history with the patient and they are unchanged from previous  note.  ALLERGIES:  has no known allergies.  MEDICATIONS:  Current Outpatient Medications  Medication Sig Dispense Refill   acetaminophen  (TYLENOL ) 500 MG tablet Take 1 tablet (500 mg total) by mouth 2 (two) times daily. 60 tablet 3   clindamycin  (CLINDAGEL) 1 % gel Apply topically 2 (two) times daily. 60 g 0   dexamethasone  (DECADRON ) 4 MG tablet Take 1 tablet (4 mg total) by mouth daily. Start the day after chemotherapy for 1-3 days.  Take with food. 30 tablet 1   diclofenac  Sodium (VOLTAREN  ARTHRITIS PAIN) 1 % GEL Apply 4 g topically 4 (four) times daily. 100 g 3   doxycycline  (VIBRA -TABS) 100 MG tablet Take 1 tablet (100 mg total) by mouth 2 (two) times daily. 20 tablet 0   gabapentin  (NEURONTIN ) 600 MG tablet Take 2 tablets (1,200 mg total) by mouth 3 (three) times daily. 180 tablet 3   magic mouthwash (nystatin , lidocaine , diphenhydrAMINE , alum & mag hydroxide) suspension Swish for 5 seconds and swallow 5 mLs by mouth 3 (three) times daily as needed for mouth pain. 140 mL 0   ondansetron  (ZOFRAN -ODT) 4 MG disintegrating tablet Dissolve 1 tablet (4 mg total) by mouth every 6 (six) hours as needed for nausea or vomiting. 30 tablet 1   pantoprazole  (PROTONIX ) 40 MG tablet Take 1 tablet by mouth 2 (two) times daily.     potassium chloride  SA (KLOR-CON  M) 20 MEQ tablet Take 1 tablet (20 mEq total) by mouth daily. When you take diuretics 30 tablet 0   prochlorperazine  (COMPAZINE ) 10 MG tablet Take 1 tablet (10 mg total) by mouth every 6 (six) hours as needed for nausea or vomiting. 30 tablet 2   pantoprazole  (PROTONIX ) 40 MG tablet Take 1 tablet (40 mg) by mouth 2 times daily. 180 tablet 1   No current facility-administered medications for this visit.    Facility-Administered Medications Ordered in Other Visits  Medication Dose Route Frequency Provider Last Rate Last Admin   0.9 %  sodium chloride  infusion   Intravenous Continuous [name], MD 10 mL/hr at 02/04/24 0958 New Bag at 02/04/24 0958   sodium chloride  flush (NS) 0.9 % injection 10 mL  10 mL Intracatheter PRN [name], MD        PHYSICAL EXAMINATION: ECOG PERFORMANCE STATUS: 2 - Symptomatic, <50% confined to bed  Vitals:   02/04/24 0847  BP: 122/80  Pulse: (!) 101  Resp: 17  Temp: 98.7 F (37.1 C)  SpO2: 100%   Wt Readings from Last 3 Encounters:  02/04/24 187 lb 3.2 oz (84.9 kg)  01/21/24 188 lb 12.8 oz (85.6 kg)  01/08/24 188 lb (85.3 kg)     GENERAL:alert, no distress and comfortable SKIN: skin color, texture, turgor are normal, no rashes or significant lesions EYES: normal, Conjunctiva are pink and non-injected, sclera clear NECK: supple, thyroid  normal size, non-tender, without nodularity LYMPH:  no palpable lymphadenopathy in the cervical, axillary  LUNGS: clear to auscultation and percussion with normal breathing effort HEART: regular rate & rhythm and no murmurs and no lower extremity edema ABDOMEN:abdomen soft, non-tender and normal bowel sounds Musculoskeletal:no cyanosis of digits and no clubbing   Physical Exam    LABORATORY DATA:  I have reviewed the data as listed    Latest Ref Rng & Units 02/04/2024    8:28 AM 01/21/2024    8:45 AM 01/08/2024    9:17 AM  CBC  WBC 4.0 - 10.5 K/uL 13.0  6.6  7.5   Hemoglobin 13.0 -  17.0 g/dL 8.1  8.1  7.4   Hematocrit 39.0 - 52.0 % 26.0  26.2  24.2   Platelets 150 - 400 K/uL 148  144  157         Latest Ref Rng & Units 02/04/2024    8:28 AM 01/21/2024    8:45 AM 01/08/2024    9:17 AM  CMP  Glucose 70 - 99 mg/dL 881  91  897   BUN 6 - 20 mg/dL 19  12  11    Creatinine 0.61 - 1.24 mg/dL 9.05  9.16  9.01   Sodium 135 - 145 mmol/L 137  140  141   Potassium 3.5 - 5.1 mmol/L 5.1  4.3  4.1   Chloride 98 -  111 mmol/L 104  106  110   CO2 22 - 32 mmol/L 22  25  24    Calcium  8.9 - 10.3 mg/dL 9.1  8.6  8.4   Total Protein 6.5 - 8.1 g/dL 6.6  6.0  5.9   Total Bilirubin 0.0 - 1.2 mg/dL 0.6  0.5  0.4   Alkaline Phos 38 - 126 U/L 85  76  68   AST 15 - 41 U/L 28  26  23    ALT 0 - 44 U/L 17  14  12        RADIOGRAPHIC STUDIES: I have personally reviewed the radiological images as listed and agreed with the findings in the report. No results found.    No orders of the defined types were placed in this encounter.  All questions were answered. The patient knows to call the clinic with any problems, questions or concerns. No barriers to learning was detected. The total time spent in the appointment was 25 minutes, including review of chart and various tests results, discussions about plan of care and coordination of care plan     Onita Mattock, MD 02/04/2024     "

## 2024-02-04 NOTE — Assessment & Plan Note (Signed)
  cT3N3Mx, with hypermetabolic left supraclavicular node, and indeterminate adrenal nodule, MMR normal ypT3ypN3a, liver and nodes recurrence in 07/2022 -diagnosed 10/18/21 by EGD for 6 month f/u of gastric ulcers, showed mucosal intramucosal adenocarcinoma. MMR normal. -baseline CEA 11/01/21 significantly elevated at 1,989.58. -EUS on 11/06/21 by Dr. Burnette, staged as T3 N3 (at least stage III) -staging CT CAP 11/08/21 showed: stable gastric antrum wall thickening; increased size of upper abdominal lymph nodes; stable left adrenal nodule, 1.9 cm. -PET scan showed FDG avid lymph nodes within the gastrohepatic ligament, portacaval region, and mesentery. Tracer avid left supraclavicular lymph node and indeterminate left adrenal gland nodule with mild FDG uptake. -he underwent [place] node biopsy on 1/17 which was negative for malignant cells, but no lymphoid tissue seen on biopsy  -repeated PET on 02/07/22 after 3 months neoadjuvant chemo showed resolved left supraclavicular lymph node, and significant improvement in regional lymph nodes and the primary tumor, stable and indeterminate left adrenal nodule.  -CT adrenal protocol showed the left adrenal nodule is likely benign adenoma, no biopsy is needed. -Completed neoadjvant chemo FLOT s/p 12 cycles 11/22/21 - 04/27/2022 -restaging PET on 05/16/22 showed significantly hypermetabolic activity at the primary gastric cancer in pylorus, and a small hypermetabolic mesenteric lymph node, no other evidence of metastasis. surgeon Dr. Stevie aware of PET findings -S/p distal gastrectomy 05/20/22, path showed ypT3N3a with clear margins, 7/18 + LNs, and lymphovascular invasion identified. There was some treatment effect in a few lymph nodes nut not in the primary tumor -due to rising tumor marker, he underwent PET scan on July 07, 2022, which unfortunately showed hypermetabolic new adenopathy in right hilar and mesentery, diffuse liver metastasis, consistent with metastatic  recurrence.  I personally reviewed the PET scan images with patient -Unfortunately his disease is not curable at this stage. -The goal of chemotherapy is palliative, to prolong his life -I discussed his next generation sequencing foundation one result, which showed EGFR amplification, no other targetable mutations.  His tumor was previously tested for HER2 which was negative (0-1+).  PD-L1 CPS score was 2%, no significant benefit of immunotherapy.  -He started paclitaxel  and ramucirumab  on 07/25/2022  He developed worsening neuropathy quickly, and I had to switch his treatment to FOLFIRI on 08/29/2022, every 2 weeks, he is overall tolerating well. -restaging CT 01/19/2023 showed disease progression with new pathologic right paratracheal mediastinal lymph nodes as well as increasing size of several liver metastases. -I changed his chemo to taxol , ramucizumab and Nivo on 02/05/2023, every 2 weeks per his request  -restaging CT 04/08/2023 showed partial response in liver and mediastinal nodes  -restaging PET 07/24/2023 showed mixed response comparing to PET in 07/2022 and CT in 04/2023, with significantly improvement in liver mets and slightly progression in nodes and left adrenal glad metastasis -restaging CT 10/12/2023 showed improved liver mets and stable other node mets  -will continue paclitaxel , ramucirumab  and Nivo every 2 weeks

## 2024-02-05 LAB — T4: T4, Total: 6.3 ug/dL (ref 4.5–12.0)

## 2024-02-07 ENCOUNTER — Other Ambulatory Visit: Payer: Self-pay

## 2024-02-18 ENCOUNTER — Inpatient Hospital Stay: Admitting: Nurse Practitioner

## 2024-02-18 ENCOUNTER — Inpatient Hospital Stay: Attending: Physician Assistant

## 2024-02-18 ENCOUNTER — Inpatient Hospital Stay

## 2024-02-25 ENCOUNTER — Ambulatory Visit: Admitting: Family Medicine

## 2024-03-03 ENCOUNTER — Inpatient Hospital Stay

## 2024-03-03 ENCOUNTER — Inpatient Hospital Stay: Admitting: Hematology

## 2024-03-17 ENCOUNTER — Inpatient Hospital Stay: Admitting: Hematology

## 2024-03-17 ENCOUNTER — Inpatient Hospital Stay

## 2024-03-17 ENCOUNTER — Inpatient Hospital Stay: Attending: Physician Assistant

## 2024-03-31 ENCOUNTER — Inpatient Hospital Stay

## 2024-03-31 ENCOUNTER — Inpatient Hospital Stay: Admitting: Hematology
